# Patient Record
Sex: Female | Born: 1950
Health system: Southern US, Community
[De-identification: ages and names within clinical notes are randomized; demographics above are authoritative.]

## PROBLEM LIST (undated history)

## (undated) DIAGNOSIS — F32A Depression, unspecified: Secondary | ICD-10-CM

## (undated) DIAGNOSIS — I10 Essential (primary) hypertension: Secondary | ICD-10-CM

## (undated) DIAGNOSIS — F329 Major depressive disorder, single episode, unspecified: Secondary | ICD-10-CM

## (undated) DIAGNOSIS — K5792 Diverticulitis of intestine, part unspecified, without perforation or abscess without bleeding: Secondary | ICD-10-CM

## (undated) DIAGNOSIS — E785 Hyperlipidemia, unspecified: Secondary | ICD-10-CM

## (undated) DIAGNOSIS — F419 Anxiety disorder, unspecified: Secondary | ICD-10-CM

## (undated) DIAGNOSIS — K589 Irritable bowel syndrome without diarrhea: Secondary | ICD-10-CM

## (undated) DIAGNOSIS — K5732 Diverticulitis of large intestine without perforation or abscess without bleeding: Secondary | ICD-10-CM

## (undated) HISTORY — DX: Anxiety disorder, unspecified: F41.9

## (undated) HISTORY — DX: Depression, unspecified: F32.A

## (undated) HISTORY — PX: TONSILLECTOMY: SHX5217

## (undated) HISTORY — DX: Diverticulitis of large intestine without perforation or abscess without bleeding: K57.32

## (undated) HISTORY — DX: Essential (primary) hypertension: I10

## (undated) HISTORY — PX: ADENOIDECTOMY: SUR15

## (undated) HISTORY — DX: Hyperlipidemia, unspecified: E78.5

## (undated) HISTORY — PX: WRIST SURGERY: SHX841

## (undated) HISTORY — DX: Major depressive disorder, single episode, unspecified: F32.9

## (undated) HISTORY — PX: VAGINAL HYSTERECTOMY: SUR661

## (undated) HISTORY — PX: TONSILLECTOMY: SUR1361

## (undated) HISTORY — DX: Diverticulitis of intestine, part unspecified, without perforation or abscess without bleeding: K57.92

## (undated) HISTORY — DX: Irritable bowel syndrome, unspecified: K58.9

---

## 2009-04-11 ENCOUNTER — Ambulatory Visit (HOSPITAL_COMMUNITY): Admission: RE | Admit: 2009-04-11 | Discharge: 2009-04-11 | Payer: Self-pay | Admitting: Internal Medicine

## 2009-04-11 ENCOUNTER — Encounter (INDEPENDENT_AMBULATORY_CARE_PROVIDER_SITE_OTHER): Payer: Self-pay | Admitting: *Deleted

## 2009-04-12 ENCOUNTER — Encounter (INDEPENDENT_AMBULATORY_CARE_PROVIDER_SITE_OTHER): Payer: Self-pay | Admitting: *Deleted

## 2009-04-12 ENCOUNTER — Telehealth (INDEPENDENT_AMBULATORY_CARE_PROVIDER_SITE_OTHER): Payer: Self-pay | Admitting: *Deleted

## 2009-04-13 ENCOUNTER — Ambulatory Visit: Payer: Self-pay | Admitting: Cardiology

## 2009-04-13 ENCOUNTER — Ambulatory Visit: Payer: Self-pay | Admitting: Gastroenterology

## 2009-04-13 ENCOUNTER — Telehealth: Payer: Self-pay | Admitting: Physician Assistant

## 2009-04-13 DIAGNOSIS — F411 Generalized anxiety disorder: Secondary | ICD-10-CM

## 2009-04-13 DIAGNOSIS — F324 Major depressive disorder, single episode, in partial remission: Secondary | ICD-10-CM | POA: Insufficient documentation

## 2009-04-13 DIAGNOSIS — F329 Major depressive disorder, single episode, unspecified: Secondary | ICD-10-CM | POA: Insufficient documentation

## 2009-04-13 DIAGNOSIS — K589 Irritable bowel syndrome without diarrhea: Secondary | ICD-10-CM | POA: Insufficient documentation

## 2009-04-13 DIAGNOSIS — F419 Anxiety disorder, unspecified: Secondary | ICD-10-CM | POA: Insufficient documentation

## 2009-04-17 ENCOUNTER — Telehealth: Payer: Self-pay | Admitting: Physician Assistant

## 2009-04-19 ENCOUNTER — Ambulatory Visit: Payer: Self-pay | Admitting: Gastroenterology

## 2009-04-19 DIAGNOSIS — K5732 Diverticulitis of large intestine without perforation or abscess without bleeding: Secondary | ICD-10-CM

## 2009-04-19 HISTORY — DX: Diverticulitis of large intestine without perforation or abscess without bleeding: K57.32

## 2009-04-20 ENCOUNTER — Telehealth: Payer: Self-pay | Admitting: Gastroenterology

## 2009-04-26 ENCOUNTER — Telehealth: Payer: Self-pay | Admitting: Gastroenterology

## 2009-04-26 ENCOUNTER — Ambulatory Visit: Payer: Self-pay | Admitting: Internal Medicine

## 2009-04-27 ENCOUNTER — Telehealth: Payer: Self-pay | Admitting: Gastroenterology

## 2009-05-03 ENCOUNTER — Encounter (INDEPENDENT_AMBULATORY_CARE_PROVIDER_SITE_OTHER): Payer: Self-pay | Admitting: *Deleted

## 2009-05-03 ENCOUNTER — Ambulatory Visit: Payer: Self-pay | Admitting: Gastroenterology

## 2009-05-04 ENCOUNTER — Telehealth: Payer: Self-pay | Admitting: Gastroenterology

## 2009-05-05 ENCOUNTER — Ambulatory Visit: Payer: Self-pay | Admitting: Gastroenterology

## 2009-05-09 ENCOUNTER — Encounter: Payer: Self-pay | Admitting: Gastroenterology

## 2009-06-01 ENCOUNTER — Telehealth: Payer: Self-pay | Admitting: Gastroenterology

## 2010-02-05 ENCOUNTER — Emergency Department (HOSPITAL_COMMUNITY)
Admission: EM | Admit: 2010-02-05 | Discharge: 2010-02-05 | Payer: Self-pay | Source: Home / Self Care | Admitting: Emergency Medicine

## 2010-03-04 HISTORY — PX: COLON SURGERY: SHX602

## 2010-03-18 ENCOUNTER — Emergency Department (HOSPITAL_COMMUNITY)
Admission: EM | Admit: 2010-03-18 | Discharge: 2010-03-18 | Payer: Self-pay | Source: Home / Self Care | Admitting: Family Medicine

## 2010-04-03 NOTE — Letter (Signed)
Summary: [facility] Instructions  [facility]  [address] [place], [place] [address]   Phone: [phone]  Fax: [phone]       Teresa Vasquez    08/08/1950    MRN: [id number]        Procedure Day /Date: 05/05/2009 Friday     Arrival Time: 8:00am     Procedure Time: 9:00am     Location of Procedure:                    X   [facility] (4th Floor)   PREPARATION FOR COLONOSCOPY WITH MOVIPREP   Starting 5 days prior to your procedure Today do not eat nuts, seeds, popcorn, corn, beans, peas,  salads, or any raw vegetables.  Do not take any fiber supplements (e.g. Metamucil, Citrucel, and Benefiber).  THE DAY BEFORE YOUR PROCEDURE         DATE: 05/04/2009  DAY: Thursday  1.  Drink clear liquids the entire day-NO SOLID FOOD  2.  Do not drink anything colored red or purple.  Avoid juices with pulp.  No orange juice.  3.  Drink at least 64 oz. (8 glasses) of fluid/clear liquids during the day to prevent dehydration and help the prep work efficiently.  CLEAR LIQUIDS INCLUDE: Water Jello Ice Popsicles Tea (sugar ok, no milk/cream) Powdered fruit flavored drinks Coffee (sugar ok, no milk/cream) Gatorade Juice: apple, white grape, white cranberry  Lemonade Clear bullion, consomm, broth Carbonated beverages (any kind) Strained chicken noodle soup Hard Candy                             4.  In the morning, mix first dose of MoviPrep solution:    Empty 1 Pouch A and 1 Pouch B into the disposable container    Add lukewarm drinking water to the top line of the container. Mix to dissolve    Refrigerate (mixed solution should be used within 24 hrs)  5.  Begin drinking the prep at 5:00 p.m. The MoviPrep container is divided by 4 marks.   Every 15 minutes drink the solution down to the next mark (approximately 8 oz) until the full liter is complete.   6.  Follow completed prep with 16 oz of clear liquid of your choice (Nothing red or purple).  Continue  to drink clear liquids until bedtime.  7.  Before going to bed, mix second dose of MoviPrep solution:    Empty 1 Pouch A and 1 Pouch B into the disposable container    Add lukewarm drinking water to the top line of the container. Mix to dissolve    Refrigerate  THE DAY OF YOUR PROCEDURE      DATE: 05/05/2009 DAY: Friday  Beginning at 4:00a.m. (5 hours before procedure):         1. Every 15 minutes, drink the solution down to the next mark (approx 8 oz) until the full liter is complete.  2. Follow completed prep with 16 oz. of clear liquid of your choice.    3. You may drink clear liquids until 7:00am (2 HOURS BEFORE PROCEDURE).   MEDICATION INSTRUCTIONS  Unless otherwise instructed, you should take regular prescription medications with a small sip of water   as early as possible the morning of your procedure.         OTHER INSTRUCTIONS  You will need a responsible adult at least 58 years of age  to accompany you and drive you home.   This person must remain in the waiting room during your procedure.  Wear loose fitting clothing that is easily removed.  Leave jewelry and other valuables at home.  However, you may wish to bring a book to read or  an iPod/MP3 player to listen to music as you wait for your procedure to start.  Remove all body piercing jewelry and leave at home.  Total time from sign-in until discharge is approximately 2-3 hours.  You should go home directly after your procedure and rest.  You can resume normal activities the  day after your procedure.  The day of your procedure you should not:   Drive   Make legal decisions   Operate machinery   Drink alcohol   Return to work  You will receive specific instructions about eating, activities and medications before you leave.    The above instructions have been reviewed and explained to me by   _______________________    I fully understand and can verbalize these instructions  _____________________________ Date _________

## 2010-04-03 NOTE — Progress Notes (Signed)
Summary: speak to nurse  Phone Note Call from Patient Call back at Home Phone [phone]   Caller: Patient Call For: Teresa Vasquez Reason for Call: Talk to Nurse Summary of Call: Patient has a lot of abd pain and wants to discuss results given to her with nurse Initial call taken by: Tawni Levy,  April 17, 2009 8:30 AM  Follow-up for Phone Call        Called pt at work @ [phone] and she saw Mike Gip PA-C on 04-13-09.  Had CT, they found Diverticulitis.  Pt is still taking Antibiotiocs but not feeling much better.  I was to call in Levbid to [facility] and did not do that.  I sent that perscription today.  I urged her to take that for the cramping.  She thanked me fo making her the appt with Dr. Arlyce Vasquez on Wed 04-19-09 at 3:45 PM.  Follow-up by: Joselyn Glassman,  April 17, 2009 2:09 PM    New/Updated Medications: LEVBID 0.375 MG XR12H-TAB (HYOSCYAMINE SULFATE) Take 1 tab twice daily  for abdominal cramps and spasms Prescriptions: LEVBID 0.375 MG XR12H-TAB (HYOSCYAMINE SULFATE) Take 1 tab twice daily  for abdominal cramps and spasms  #60 x 0   Entered by:   Lowry Ram NCMA   Authorized by:   Sammuel Cooper PA-c   Signed by:   Lowry Ram NCMA on 04/17/2009   Method used:   [facility] [address] [phone][facility] (retail)       [address]       [place], [place]  [address]       Ph: [phone]       Fax: [phone]   RxID:   [id number]

## 2010-04-03 NOTE — Procedures (Signed)
Summary: Colonoscopy  Patient: Teresa Vasquez Note: All result statuses are Final unless otherwise noted.  Tests: (1) Colonoscopy (COL)   COL Colonoscopy           DONE      [facility]     [address]     [place], [place]  [address]           COLONOSCOPY PROCEDURE REPORT           PATIENT:  Teresa Vasquez, Teresa Vasquez  MR#:  [id number]     BIRTHDATE:  August 22, 1950, 58 yrs. old  GENDER:  female           ENDOSCOPIST:  Barbette Hair. Arlyce Dice, MD     Referred by:           PROCEDURE DATE:  05/05/2009     PROCEDURE:  Colon with cold biopsy polypectomy     ASA CLASS:  Class II     INDICATIONS:  abdominal pain, diverticulitis           MEDICATIONS:   Fentanyl 125 mcg IV, Versed 12 mg IV, Benadryl 25     mg IV           DESCRIPTION OF PROCEDURE:   After the risks benefits and     alternatives of the procedure were thoroughly explained, informed     consent was obtained.  Digital rectal exam was performed and     revealed no abnormalities.   The LB CF-[id number] endoscope     was introduced through the anus and advanced to the cecum, which     was identified by both the appendix and ileocecal valve, without     limitations.  The quality of the prep was adequate, using     MoviPrep.  The instrument was then slowly withdrawn as the colon     was fully examined.     <<PROCEDUREIMAGES>>           FINDINGS:  A sessile polyp was found in the sigmoid colon. It was     2 mm in size. It was found 28 cm from the point of entry.     Inflammatory appearing polyp The polyp was removed using cold     biopsy forceps (see image12).  Moderate diverticulosis was found     in the sigmoid colon (see image11 and image10).  This was     otherwise a normal examination of the colon (see image3, image4,     image6, image7, image8, image9, and image14).   Retroflexed views     in the rectum revealed no abnormalities.    The scope was then     withdrawn from the patient and the procedure completed.        COMPLICATIONS:  None           ENDOSCOPIC IMPRESSION:     1) 2 mm sessile polyp in the sigmoid colon     2) Moderate diverticulosis in the sigmoid colon     3) Otherwise normal examination     RECOMMENDATIONS:     1) continue current medications     2) If the polyp(s) removed today are proven to be adenomatous     (pre-cancerous) polyps, you will need a repeat colonoscopy in 5     years. Otherwise you should continue to follow colorectal cancer     screening guidelines for "routine risk" patients with colonoscopy     in 10 years.     3) office  visit 3 weeks           REPEAT EXAM:   You will receive a letter from Dr. Arlyce Dice in 1-2     weeks, after reviewing the final pathology, with followup     recommendations.           ______________________________     Barbette Hair Arlyce Dice, MD           CC:  Teresa Fee PA           n.     eSIGNED:   Barbette Hair. Charbel Los at 05/05/2009 09:38 AM           Page 2 of 3   Teresa Vasquez, Teresa Vasquez [place], [phone]  Note: An exclamation mark (!) indicates a result that was not dispersed into the flowsheet. Document Creation Date: 05/05/2009 9:38 AM _______________________________________________________________________  (1) Order result status: Final Collection or observation date-time: 05/05/2009 09:29 Requested date-time:  Receipt date-time:  Reported date-time:  Referring Physician:   Ordering Physician: Melvia Heaps [phone]) Specimen Source:  Source: Launa Grill Order Number: [id number] Lab site:   Appended Document: Colonoscopy 10 yr recall     Procedures Next Due Date:    Colonoscopy: 05/2019

## 2010-04-03 NOTE — Progress Notes (Signed)
Summary: TRIAGE-Condition Update  Phone Note Call from Patient Call back at [phone]   Caller: Patient Call For: Dr. Arlyce Dice Reason for Call: Talk to Nurse Summary of Call: pt had appt yesterday with Dr. Arlyce Dice and pt says she was instructed to sch a 2 week f/u. Initial call taken by: Vallarie Mare,  April 20, 2009 2:50 PM  Follow-up for Phone Call        Message left for patient to callback. Laureen Ochs LPN  April 20, 2009 3:26 PM  Was seen 04-19-09 as follow-up for diverticulitis, she continues the  Cipro/Flagyl, but states, "I just don't feel any better" Continues with intermittent LLQ pain, pain has gotten "Some better" Denies blood, black stools, fever.   1) Continue Antibiotics and Levsin 2) tylenol/Ibuprofen as needed 3)Soft,bland diet x2-4 days. Advanced as tolerated. 4) Heating pad to abdomen as needed. 5) Get plenty of rest this weekend 6) Pt. to keep scheduled office visit 05-03-09 at 11:30am 7) If symptoms become worse call back immediately or go to ER.    Follow-up by: Laureen Ochs LPN,  April 21, 2009 9:31 AM  Additional Follow-up for Phone Call Additional follow up Details #1::        c/b Mon if no better;  will repeat CT scan if pain persists Additional Follow-up by: Louis Meckel MD,  April 21, 2009 10:44 AM    Additional Follow-up for Phone Call Additional follow up Details #2::    I will call pt. 04-24-09 to get a condition update. Laureen Ochs LPN  April 21, 2009 10:46 AM   Message left for patient to callback. Laureen Ochs LPN  April 24, 2009 10:46 AM   Pt. states she feels a little better, improving slowly. She declines the CT at this time. If symptoms become worse she will call back right away.  Follow-up by: Laureen Ochs LPN,  April 24, 2009 11:10 AM

## 2010-04-03 NOTE — Assessment & Plan Note (Signed)
Summary: F/U DIVERTICULITIS               Teresa Vasquez   History of Present Illness Visit Type: Follow-up Visit Primary GI MD: Melvia Heaps MD [facility] Primary Provider: Loree Fee, PA Chief Complaint: diverticular disease, patient has had no change History of Present Illness:   Teresa Vasquez has returned for ongoing evaluation of lower abdominal  pain.  She has persistent bloating and postprandial discomfort in the lower abdomen.  A repeat CT scan showed nearly complete resolution of her proximal sigmoid diverticulitis.  I reviewed the exam and concur with the interpretation.  She is moving her bowels regularly.  She received an extra week of antibiotics because of persistent discomfort.  She complains of mild nausea.  She is without fever or chills.   GI Review of Systems    Reports abdominal pain, bloating, and  nausea.     Location of  Abdominal pain: lower abdomen.    Denies acid reflux, belching, chest pain, dysphagia with liquids, dysphagia with solids, heartburn, loss of appetite, vomiting, vomiting blood, weight loss, and  weight gain.      Reports black tarry stools, diarrhea, and  diverticulosis.     Denies anal fissure, change in bowel habit, constipation, fecal incontinence, heme positive stool, hemorrhoids, irritable bowel syndrome, jaundice, light color stool, liver problems, rectal bleeding, and  rectal pain.    Current Medications (verified): 1)  Vitamin D (Ergocalciferol) 50000 Unit Caps (Ergocalciferol) .... One Tablet By Mouth Once Daily 2)  Zoloft 50 Mg Tabs (Sertraline Hcl) .... One Tablet By Mouth Once Daily 3)  Alprazolam 1 Mg Tabs (Alprazolam) .... 1/2- 1 Tablets By Mouth As Needed 4)  Cipro 500 Mg Tabs (Ciprofloxacin Hcl) .... Take 1 Tab Twice Daily X 7 Days 5)  Flagyl 500 Mg Tabs (Metronidazole) .... Take 1 Tab Twice  Daily X 7 Days 6)  Levbid 0.375 Mg Xr12h-Tab (Hyoscyamine Sulfate) .... Take 1 Tab Twice Daily  For Abdominal Cramps and Spasms  Allergies  (verified): No Known Drug Allergies  Past History:  Past Medical History: Anxiety Disorder Depression Irritable Bowel Syndrome Diverticulitis  Past Surgical History: Reviewed history from 04/13/2009 and no changes required. Hysterectomy Tonsillectomy  Family History: Reviewed history from 04/13/2009 and no changes required. Family History of Heart Disease: Father No FH of Colon Cancer:  Social History: Reviewed history from 04/19/2009 and no changes required. Married Patient has never smoked.  Alcohol Use - no Daily Caffeine Use Illicit Drug Use - no Occupation: [employer]  Review of Systems       The patient complains of allergy/sinus, anxiety-new, cough, fatigue, itching, muscle pains/cramps, sleeping problems, and thirst - excessive.  The patient denies anemia, arthritis/joint pain, back pain, blood in urine, breast changes/lumps, change in vision, confusion, coughing up blood, depression-new, fainting, fever, headaches-new, hearing problems, heart murmur, heart rhythm changes, menstrual pain, night sweats, nosebleeds, pregnancy symptoms, shortness of breath, skin rash, sore throat, swelling of feet/legs, swollen lymph glands, thirst - excessive , urination - excessive , urination changes/pain, urine leakage, vision changes, and voice change.    Vital Signs:  Patient profile:   60 year old female Height:      67 inches Weight:      190.38 pounds BMI:     29.93 Pulse rate:   60 / minute Pulse rhythm:   regular BP sitting:   122 / 80  (left arm) Cuff size:   regular  Vitals Entered By: June McMurray CMA (AAMA) (  May 03, 2009 11:48 AM)  Physical Exam  Additional Exam:  On abdominal exam there are no abdominal masses, tenderness organomegaly   Impression & Recommendations:  Problem # 1:  ABDOMINAL PAIN-LLQ (ICD-789.04) Persistent pain could be due to luminal narrowing as a result of acute inflammatory disease.  This is most likely resolving acute  diverticulitis.  A structural abnormalityt of the colon including neoplasm is a less likely consideration.  Recommendations #1 DC Levbid (no improvement) #2 colonoscopy  Risks, alternatives, and complications of the procedure, including bleeding, perforation, and possible need for surgery, were explained to the patient.  Patient's questions were answered.  Other Orders: Colonoscopy (Colon)  Patient Instructions: 1)  Come for you Colonoscopy on 05/05/2009 on the 4th floor arrive at 8am. 2)  Your Moviprep has been sent to your pharm. 3)  Colonoscopy and Flexible Sigmoidoscopy brochure given.  4)  Conscious Sedation brochure given.  5)  CC Melissa Smith 6)  The medication list was reviewed and reconciled.  All changed / newly prescribed medications were explained.  A complete medication list was provided to the patient / caregiver. Prescriptions: MOVIPREP 100 GM  SOLR (PEG-KCL-NACL-NASULF-NA ASC-C) As per prep instructions.  #1 x 0   Entered by:   Harlow Mares CMA (AAMA)   Authorized by:   Louis Meckel MD   Signed by:   Harlow Mares CMA (AAMA) on 05/03/2009   Method used:   Faxed to ...       [facility] (retail)       [address]       [place], [place]  [address]       Ph: [phone]       Fax: [phone]   RxID:   [id number]

## 2010-04-03 NOTE — Progress Notes (Signed)
Summary: Triage  Phone Note Call from Patient Call back at [phone]   Caller: Patient Call For: Dr. Arlyce Dice Reason for Call: Talk to Nurse Summary of Call: Pt would like to know if we have a rebate coupon for the Moviprep? Initial call taken by: Karna Christmas,  May 04, 2009 9:12 AM  Follow-up for Phone Call         Coupon is available is having colon in am so coupon taken up to [facility] for her . Follow-up by: Teryl Lucy RN,  May 04, 2009 10:43 AM

## 2010-04-03 NOTE — Progress Notes (Signed)
Summary: TRIAGE-Repeat CT   Phone Note Call from Patient Call back at [phone]   Caller: Patient Call For: Arlyce Dice Reason for Call: Talk to Nurse Summary of Call: Patient a states that she does not feel better, still has pain, bloating wants  to speak to [facility] Initial call taken by: Tawni Levy,  April 26, 2009 9:06 AM  Follow-up for Phone Call        Pt. is being treated for diverticulitis since 04-13-09. Completes antibiotics today. Since Monday she c/o worsening nausea, bloating and abd.pain. "I just feel so tired and out of sorts, this is really getting me down" Per Dr.Murlene Revell, in the triage dated 04-24-09, if pain persists then repeat the CT scan.  Pt. declines a CT scan, states she cannot afford it. Wants to know what else Dr.Kinzley Savell recommends.  Follow-up by: Laureen Ochs LPN,  April 26, 2009 9:21 AM  Additional Follow-up for Phone Call Additional follow up Details #1::        flagyl may be causing nausea.  c/b in 2 days for update Additional Follow-up by: Louis Meckel MD,  April 26, 2009 9:33 AM    Additional Follow-up for Phone Call Additional follow up Details #2::    Above MD orders reviewed with patient. Now pt. states if she can get a CT done today she will consider it. I have contacted [facility] and they can do it at 3pm today. Pt. agrees to have CT done, all instructions reviewed with pt. by phone and she will get contrast from [place] CT today. Pt. is aware we will call in the morning with CT report and further MD orders. Follow-up by: Laureen Ochs LPN,  April 26, 2009 9:45 AM

## 2010-04-03 NOTE — Assessment & Plan Note (Signed)
Summary: abd pain and diarrhea/pl   History of Present Illness Visit Type: Initial Consult Primary GI MD: Melvia Heaps MD [facility] Primary Provider: Loree Fee, PA Chief Complaint: Intermittant Lower sharp abd pains x 2 months that is getiing more frequent and worse over last couple of weeks. Pt has been Dx with IBS but she states this is different than that pain. Pt states she does have some nausea with the pain. Pt does have diarrhea after she experiences the episodes of abd pain.  Pt does see some BRB from frequency of having a BM and a hemorrhiod.  History of Present Illness:   60 YO FEMALE REFERRED BY PRIMARY CARE FOR EVALUATION OF ABDOMINAL PAIN. PT HAS HX OF IBS -HAD BEEN SEEN BY DR Luther Parody 10 YEARS AGO,NO COLONOSCOPY. SHE SAY TYPICALLY SHE WILL HAVE SHORT BOUTS OF ABDOMINAL DISCOMFORT,AND DIARRHEA,SXSX MAY LAST A DAY OR TWO THEN RESOLVE. SHE COMES IN NOW WITH 2 MONTH HX OF LOWER ABDOMINAL DISCOMFORT THAT IS DIFFERENT, SHE DESRIBES IT AS IBS ON STEROIDS. SHE HAS BEEN HAVING CONSTANT DISCOMFORT,BLOATING AND LOOSE STOOLS . SHE HAS BEEN HAVING PAIN WAKE HER AT NIGHT,THEN URGENCY FOR BM.APPETITE FAIR, SOME NAUSEA,NO FEVER, CHILLS ETC.WEIGHT IS STABLE. SHE TOOK A COURSE OF ZPAK IN DEC,THEN AUGMENTIN IN JANUARY,BUT HER ABDOMINAL PAIN HAD STARTED BEFORE THE AUGMENTIN.   GI Review of Systems    Reports abdominal pain, bloating, and  nausea.     Location of  Abdominal pain: lower abdomen.    Denies acid reflux, belching, chest pain, dysphagia with liquids, dysphagia with solids, heartburn, loss of appetite, vomiting, vomiting blood, weight loss, and  weight gain.      Reports hemorrhoids, irritable bowel syndrome, and  rectal bleeding.     Denies anal fissure, black tarry stools, change in bowel habit, constipation, diverticulosis, fecal incontinence, heme positive stool, jaundice, light color stool, liver problems, and  rectal pain. Preventive Screening-Counseling &  Management  Alcohol-Tobacco     Smoking Status: never      Drug Use:  no.      Current Medications (verified): 1)  Vitamin D (Ergocalciferol) 50000 Unit Caps (Ergocalciferol) .... One Tablet By Mouth Once Daily 2)  Zoloft 50 Mg Tabs (Sertraline Hcl) .... One Tablet By Mouth Once Daily 3)  Alprazolam 1 Mg Tabs (Alprazolam) .... 1/2- 1 Tablets By Mouth As Needed  Allergies (verified): No Known Drug Allergies  Past History:  Past Medical History: Anxiety Disorder Depression Irritable Bowel Syndrome  Past Surgical History: Hysterectomy Tonsillectomy  Family History: Family History of Heart Disease: Father No FH of Colon Cancer:  Social History: Married Patient has never smoked.  Alcohol Use - no Daily Caffeine Use Illicit Drug Use - no Smoking Status:  never Drug Use:  no  Review of Systems  The patient denies allergy/sinus, anemia, anxiety-new, arthritis/joint pain, back pain, blood in urine, breast changes/lumps, change in vision, confusion, cough, coughing up blood, depression-new, fainting, fatigue, fever, headaches-new, hearing problems, heart murmur, heart rhythm changes, itching, menstrual pain, muscle pains/cramps, night sweats, nosebleeds, pregnancy symptoms, shortness of breath, skin rash, sleeping problems, sore throat, swelling of feet/legs, swollen lymph glands, thirst - excessive , urination - excessive , urination changes/pain, urine leakage, vision changes, and voice change.         OTHERWISE AS IN HPI  Vital Signs:  Patient profile:   60 year old female Height:      67 inches Weight:      195.50 pounds BMI:     30.73  Pulse rate:   80 / minute Pulse rhythm:   regular BP sitting:   122 / 74  (right arm) Cuff size:   regular  Vitals Entered By: Christie Nottingham CMA Duncan Dull) (April 13, 2009 9:34 AM)  Physical Exam  General:  Well developed, well nourished, no acute distress. Head:  Normocephalic and atraumatic. Eyes:  PERRLA, no icterus. Lungs:   Clear throughout to auscultation. Heart:  Regular rate and rhythm; no murmurs, rubs,  or bruits. Abdomen:  LARGE, DIFFUSELY TENDER ACROSS LOWER ABDOMEN,LEFT SIGNIFICANTLY MORE TENDER, NO PALP MASS OR HSM,BS+ Rectal:  HEME NEGATIVE Extremities:  No clubbing, cyanosis, edema or deformities noted. Neurologic:  Alert and  oriented x4;  grossly normal neurologically. Psych:  Alert and cooperative. Normal mood and affect.   Impression & Recommendations:  Problem # 1:  ABDOMINAL PAIN-LLQ (ICD-789.04) Assessment New 60 YO FEMALE WITH PREVIOUS DX OF IBS WITH NEW DIFFERENT LOWER ABDOMINAL PAIN X 2 MONTHS,LEFT GREATER THAN RIGHT. R/O DIVERTICULITIS,OCCULT LESION.  SCHEDULE FOR CT SCAN ABDOMEN /PELVIS  WITHIN NEXT 24 HOURS SHE WILL NEED COLONOSCOPY BUT TIMING WILL DEPEND ON FINDINGS ON CT ABD/PELVIS,THIS WILL BE SCHEDULED WITH DR. KAPLAN. CONTINUE LEVBID TWICE DAILY RECENT LABS REVIEWED Theda Sers  Problem # 2:  IRRITABLE BOWEL SYNDROME (ICD-564.1) Assessment: Comment Only  Orders: CT Abdomen/Pelvis with Contrast (CT Abd/Pelvis w/con)  Problem # 3:  DEPRESSION (ICD-311) Assessment: Comment Only  Patient Instructions: 1)  Scheduled CT of the Abdomen and Pelvis for 04-13-09 at 2:30 PM  . 2)  Contrast and directions provided. 3)  Use the Levbid twice daily as needed for abd cramping. 4)  Copy sent to : Loree Fee, MD 5)  The medication list was reviewed and reconciled.  All changed / newly prescribed medications were explained.  A complete medication list was provided to the patient / caregiver. Prescriptions: FLAGYL 500 MG TABS (METRONIDAZOLE) Take 1 tab twice  daily x 14 days  #28 x 0   Entered by:   Lowry Ram NCMA   Authorized by:   Sammuel Cooper PA-c   Signed by:   Lowry Ram NCMA on 04/13/2009   Method used:   Faxed to ...       [facility] (retail)       [address]       [place], [place]  [address]       Ph: [phone]       Fax:  [phone]   RxID:   [id number] CIPRO 500 MG TABS (CIPROFLOXACIN HCL) Take 1 tab twice daily x 14 days  #28 x 0   Entered by:   Lowry Ram NCMA   Authorized by:   Sammuel Cooper PA-c   Signed by:   Lowry Ram NCMA on 04/13/2009   Method used:   [facility]ron[facility]        [facility]  [address] [phone]* (retail)       [address]       [place], [place]  [address]       Ph: [phone]       Fax: [phone]   RxID:   [id number]

## 2010-04-03 NOTE — Letter (Signed)
Summary: Patient Notice-Hyperplastic Polyps  [facility]  [address] [place], [place] [address]   Phone: [phone]  Fax: [phone]        May 09, 2009 MRN: [id number]    [facility] [address] [place], [place]  [address]    Dear Teresa Vasquez,  I am pleased to inform you that the colon polyp(s) removed during your recent colonoscopy was (were) found to be hyperplastic.  These types of polyps are NOT pre-cancerous.  It is therefore my recommendation that you have a repeat colonoscopy examination in 10_ years for routine colorectal cancer screening.  Should you develop new or worsening symptoms of abdominal pain, bowel habit changes or bleeding from the rectum or bowels, please schedule an evaluation with either your primary care physician or with me.  Additional information/recommendations:  __No further action with gastroenterology is needed at this time.      Please follow-up with your primary care physician for your other      healthcare needs. __Please call [phone] to schedule a return visit to review      your situation.  __Please keep your follow-up visit as already scheduled.  _x_Continue treatment plan as outlined the day of your exam.  Please call us if you are having persistent problems or have questions about your condition that have not been fully answered at this time.  Sincerely,  Louis Meckel MD This letter has been electronically signed by your physician.  Appended Document: Patient Notice-Hyperplastic Polyps Letter mailed 3.10.11

## 2010-04-03 NOTE — Letter (Signed)
Summary: New Patient letter  [facility]  [address] [place], [place] [address]   Phone: [phone]  Fax: [phone]       04/12/2009 MRN: [id number]  [facility] [address] [place], [place]  [address]  Dear Ms. Levins,  Welcome to the Gastroenterology Division at [facility].    You are scheduled to see Dr.  Rob Bunting on April 21, 2009 at 3:30pm on the 3rd floor at [facility], [address]  We ask that you try to arrive at our office 15 minutes prior to your appointment time to allow for check-in.  We would like you to complete the enclosed self-administered evaluation form prior to your visit and bring it with you on the day of your appointment.  We will review it with you.  Also, please bring a complete list of all your medications or, if you prefer, bring the medication bottles and we will list them.  Please bring your insurance card so that we may make a copy of it.  If your insurance requires a referral to see a specialist, please bring your referral form from your primary care physician.  Co-payments are due at the time of your visit and may be paid by cash, check or credit card.     Your office visit will consist of a consult with your physician (includes a physical exam), any laboratory testing he/she may order, scheduling of any necessary diagnostic testing (e.g. x-ray, ultrasound, CT-scan), and scheduling of a procedure (e.g. Endoscopy, Colonoscopy) if required.  Please allow enough time on your schedule to allow for any/all of these possibilities.    If you cannot keep your appointment, please call [phone] to cancel or reschedule prior to your appointment date.  This allows [place] the opportunity to schedule an appointment for another patient in need of care.  If you do not cancel or reschedule by 5 p.m. the business day prior to your appointment date, you will be charged a $50.00 late cancellation/no-show fee.    Thank you  for choosing [facility] for your medical needs.  We appreciate the opportunity to care for you.  Please visit [place] at our website  to learn more about our practice.                     Sincerely,                                                             The Gastroenterology Division

## 2010-04-03 NOTE — Progress Notes (Signed)
Summary: Sooner Appt.  Phone Note Call from Patient Call back at [phone]   Caller: Patient Call For: Dr.Jacobs Reason for Call: Talk to Nurse Summary of Call: Pt has an appt. on 04-21-09 w/Dr. Christella Hartigan does not feel like she can wait that long. She is having abdominal pain and diarrhea. Pt. said she had a gallbladder ultrasound that was neg. yesterday. She also has IBS. Initial call taken by: Karna Christmas,  April 12, 2009 2:01 PM  Follow-up for Phone Call        pt has been scheduled with Amy for 04/13/09. Follow-up by: Chales Abrahams CMA Duncan Dull),  April 12, 2009 2:27 PM

## 2010-04-03 NOTE — Progress Notes (Signed)
Summary: Perscriptions to [facility]  Phone Note Outgoing Call   Call placed by: Joselyn Glassman,  April 13, 2009 5:14 PM Call placed to: Patient Summary of Call: Informed pt I had to send her perscriptions for Flagyl and Cipro to [facility]  I gave her the phone number and address.  She thanked [place] for finding the Flagyl for her.  I explained the shortage all over town with the pharmacies. I called the pharmacy and they did  receive the fax's for the perscriptions. Initial call taken by: Joselyn Glassman,  April 13, 2009 5:17 PM

## 2010-04-03 NOTE — Progress Notes (Signed)
Summary: Refill request  ---- Converted from flag ---- ---- 06/01/2009 9:08 AM, Louis Meckel MD wrote: If she's having pain she need to be reassessed; she can see  a PA  ---- 05/31/2009 4:13 PM, Merri Ray CMA (AAMA) wrote: Dr Arlyce Dice, I have recieved a refill request for Flagyl for this pt. Can I refill?? ------------------------------  Sent denial to pharmacyCalled pt to inform if still in pain she needs to contact office

## 2010-04-03 NOTE — Progress Notes (Signed)
Summary: TRIAGE  Phone Note Call from Patient Call back at Home Phone [phone]   Call For: Dr Arlyce Dice Summary of Call: Wonders if she needs another course of antibiotics? Initial call taken by: Leanor Kail [facility],  April 27, 2009 2:18 PM  Follow-up for Phone Call        DR.KAPLAN PLEASE REVIEW CT FROM 04-26-09 AND ADVISE  Follow-up by: Laureen Ochs LPN,  April 27, 2009 2:21 PM  Additional Follow-up for Phone Call Additional follow up Details #1::        1 more week of cipro/flagyl Additional Follow-up by: Louis Meckel MD,  April 27, 2009 2:35 PM    Additional Follow-up for Phone Call Additional follow up Details #2::    Above MD orders reviewed with patient. She will callback in 1 week with an update, sooner as needed. Follow-up by: Laureen Ochs LPN,  April 27, 2009 3:03 PM  New/Updated Medications: CIPRO 500 MG TABS (CIPROFLOXACIN HCL) Take 1 tab twice daily x 7 days FLAGYL 500 MG TABS (METRONIDAZOLE) Take 1 tab twice  daily x 7 days Prescriptions: FLAGYL 500 MG TABS (METRONIDAZOLE) Take 1 tab twice  daily x 7 days  #14 x 0   Entered by:   Laureen Ochs LPN   Authorized by:   Louis Meckel MD   Signed by:   Laureen Ochs LPN on 16/60/6301   Method used:   [facility]  [address] [phone][facility] (retail)       [address]       [place], [place]  [address]       Ph: [phone]       Fax: [phone]   RxID:   [id number] CIPRO 500 MG TABS (CIPROFLOXACIN HCL) Take 1 tab twice daily x 7 days  #14 x 0   Entered by:   Laureen Ochs LPN   Authorized by:   Louis Meckel MD   Signed by:   Laureen Ochs LPN on 94/85/4627   Method used:   [facility]  [address] [phone]* (retail)       [address]       [place], [place]  [address]       Ph: [phone]       Fax: [phone]   RxID:   [id number]   Appended Document: TRIAGE Resent in Flagyl 500 to [facility] for pt. L/M for pt to pick up later this afternoon

## 2010-04-03 NOTE — Assessment & Plan Note (Signed)
Summary: F/U Diverticulitis, review CT, Saw Amy Esterwood PA   History of Present Illness Visit Type: Follow-up Visit Primary GI MD: Melvia Heaps MD [facility] Primary Provider: Loree Fee, PA Chief Complaint: follow-up diverticulitis/CT Scan/not feeling any better History of Present Illness:   Ms. Theard has returned for followup of her abdominal pain.  CT Scan, which I reviewed, demonstrates acute inflammatory changes and a thickened colonic wall near the sigmoid colon consistent with acute diverticulitis.  She has been taking Cipro and Flagyl.  She reports persistent pressure--like pain left lower quadrant.  She is without fever or chills.  Pain is not severe but it remains.   GI Review of Systems    Reports abdominal pain, bloating, and  nausea.     Location of  Abdominal pain: generalized.    Denies acid reflux, belching, chest pain, dysphagia with liquids, dysphagia with solids, heartburn, loss of appetite, vomiting, vomiting blood, weight loss, and  weight gain.        Denies anal fissure, black tarry stools, change in bowel habit, constipation, diarrhea, diverticulosis, fecal incontinence, heme positive stool, hemorrhoids, irritable bowel syndrome, jaundice, light color stool, liver problems, rectal bleeding, and  rectal pain.    Current Medications (verified): 1)  Vitamin D (Ergocalciferol) 50000 Unit Caps (Ergocalciferol) .... One Tablet By Mouth Once Daily 2)  Zoloft 50 Mg Tabs (Sertraline Hcl) .... One Tablet By Mouth Once Daily 3)  Alprazolam 1 Mg Tabs (Alprazolam) .... 1/2- 1 Tablets By Mouth As Needed 4)  Cipro 500 Mg Tabs (Ciprofloxacin Hcl) .... Take 1 Tab Twice Daily X 14 Days 5)  Flagyl 500 Mg Tabs (Metronidazole) .... Take 1 Tab Twice  Daily X 14 Days 6)  Levbid 0.375 Mg Xr12h-Tab (Hyoscyamine Sulfate) .... Take 1 Tab Twice Daily  For Abdominal Cramps and Spasms  Allergies (verified): No Known Drug Allergies  Family History: Reviewed history from 04/13/2009 and no  changes required. Family History of Heart Disease: Father No FH of Colon Cancer:  Social History: Reviewed history from 04/13/2009 and no changes required. Married Patient has never smoked.  Alcohol Use - no Daily Caffeine Use Illicit Drug Use - no Occupation: [employer]  Review of Systems       The patient complains of cough, fatigue, fever, and muscle pains/cramps.  The patient denies allergy/sinus, anemia, anxiety-new, arthritis/joint pain, back pain, blood in urine, breast changes/lumps, change in vision, confusion, coughing up blood, depression-new, fainting, headaches-new, hearing problems, heart murmur, heart rhythm changes, itching, menstrual pain, night sweats, nosebleeds, pregnancy symptoms, shortness of breath, skin rash, sleeping problems, sore throat, swelling of feet/legs, swollen lymph glands, thirst - excessive , urination - excessive , urination changes/pain, urine leakage, vision changes, and voice change.    Vital Signs:  Patient profile:   60 year old female Height:      67 inches Weight:      195 pounds BMI:     30.65 Pulse rate:   68 / minute Pulse rhythm:   regular BP sitting:   118 / 68  (left arm)  Vitals Entered By: Milford Cage NCMA (April 19, 2009 3:49 PM)  Physical Exam  Additional Exam:  She is a well-developed well-nourished female  On abdominal exam there is fullness in the left lower quadrant without guarding or rebound there are no frank masses organomegaly   Impression & Recommendations:  Problem # 1:  DIVERTICULITIS OF COLON (ICD-562.11) On antibiotics she has had just slight improvement.  Pain and pressure may be  due to luminal narrowing with spasm.  Recommendations #1 continue antibiotics #2 continue hyoscyamine as needed  Patient Instructions: 1)  CC Loree Fee, PA

## 2010-05-10 ENCOUNTER — Telehealth: Payer: Self-pay | Admitting: Gastroenterology

## 2010-05-11 ENCOUNTER — Encounter: Payer: Self-pay | Admitting: Nurse Practitioner

## 2010-05-11 ENCOUNTER — Ambulatory Visit (INDEPENDENT_AMBULATORY_CARE_PROVIDER_SITE_OTHER): Payer: BC Managed Care – PPO | Admitting: Nurse Practitioner

## 2010-05-11 DIAGNOSIS — K5732 Diverticulitis of large intestine without perforation or abscess without bleeding: Secondary | ICD-10-CM

## 2010-05-15 NOTE — Progress Notes (Signed)
Summary: Triage:  Abdominal Pain  Phone Note Call from Patient Call back at [phone]   Caller: Patient Call For: Dr Arlyce Dice Reason for Call: Talk to Nurse Summary of Call: Patient wants to be seen asap for diverticulities flare up. Initial call taken by: Tawni Levy,  May 10, 2010 10:07 AM  Follow-up for Phone Call        Patient states she is having abdominal pain on her left side below her belly button. States the pain is just like it was last year when she had diverticulitis. Appt made for patient to see Willette Cluster, RNP 05/11/10@8 :30am. Patient aware of appointment date and time. Follow-up by: Selinda Michaels RN,  May 10, 2010 11:05 AM

## 2010-05-21 ENCOUNTER — Telehealth: Payer: Self-pay | Admitting: Nurse Practitioner

## 2010-05-22 ENCOUNTER — Telehealth: Payer: Self-pay | Admitting: *Deleted

## 2010-05-22 NOTE — Telephone Encounter (Signed)
Spoke with patient. Per Willette Cluster, NP  If patient has tried Hyoscyamine repeat the CT of abdomen and pelvis. Patient has tried Hyoscyamine. Scheduled patient with [facility]) on 05/24/10 at 2:30 PM. Patient will go to their facility to pick up contrast on Wednesday because it is closer to her work.

## 2010-05-23 ENCOUNTER — Ambulatory Visit (INDEPENDENT_AMBULATORY_CARE_PROVIDER_SITE_OTHER)
Admission: RE | Admit: 2010-05-23 | Discharge: 2010-05-23 | Disposition: A | Discharge status: Home / Self Care | Payer: BC Managed Care – PPO | Source: Ambulatory Visit | Attending: Gastroenterology | Admitting: Gastroenterology

## 2010-05-23 ENCOUNTER — Telehealth: Payer: Self-pay | Admitting: Gastroenterology

## 2010-05-23 DIAGNOSIS — R109 Unspecified abdominal pain: Secondary | ICD-10-CM

## 2010-05-23 MED ORDER — IOHEXOL 300 MG/ML  SOLN
100.0000 mL | Freq: Once | INTRAMUSCULAR | Status: AC | PRN
  Administered 2010-05-23: 100 mL via INTRAVENOUS

## 2010-05-23 NOTE — Telephone Encounter (Signed)
Spoke with pt and let her know that the first look at the CT scan did not show anything that really stood out as abnormal. Pt aware but she still wants to know why she is having pain. Informed pt I would send Willette Cluster, NP a note regarding her concern.

## 2010-05-24 ENCOUNTER — Telehealth: Payer: Self-pay | Admitting: Gastroenterology

## 2010-05-24 NOTE — Telephone Encounter (Signed)
Catheter no changes consistent with acute diverticulitis. I do recommend that she take hyomax as needed for abdominal discomfort.   She can complete her course of antibiotics. She should begin a probiotic as well and I like to see her back in the office in about 4 weeks

## 2010-05-24 NOTE — Telephone Encounter (Signed)
Spoke with pt last night and discussed her CT results per [place]. Pt is upset because she still does not feel good. She states she is still tired, still bloated, and still has pressure below her belly button. Pt states she does feel better since taking the antibiotic. Pt wants to know is she just supposed to "suck it up and go on." I asked if she was taking the Hyoscyamine and she states that was for her IBS, not this problems. Let pt know I would send Dr. Arlyce Dice a note and see what his recommendations are. Dr. Arlyce Dice please advise.

## 2010-05-24 NOTE — Telephone Encounter (Signed)
Rene Kocher, please let patient know that her CTscan was normal. It isn't clear why she is having the pain. Please reconfirm that she isn't having any urinary or GYN symptoms. Please have her take the bowel antispasmotic 2-3 times days and make her a follow up, preferably with primary GI doc. Thanks

## 2010-05-28 ENCOUNTER — Telehealth: Payer: Self-pay | Admitting: Gastroenterology

## 2010-05-28 NOTE — Telephone Encounter (Signed)
Patient does not want to continue with this practice.

## 2010-05-28 NOTE — Telephone Encounter (Signed)
Patient unhappy with how she has been "treated." Pt states she did not get a call from anyone regarding her CT scan. Reminded pt I had spoken with her the day the scan was done and that Lucrezia Europe NP has looked it over quickly and she saw nothing that stood out. Pt states she feels like she has just had the ball dropped to many times with her care. Pt does not want to schedule any further appts. Pt states she will ask for a referral to another GI from her medical doctor.

## 2010-05-28 NOTE — Telephone Encounter (Signed)
Tell her that I reviewed your note and that I would be happy to see her sometime this week with any cancellation. Since she continues not to feel well.

## 2010-05-28 NOTE — Telephone Encounter (Signed)
Pt aware of Dr. Marzetta Board recommendation. Pt not happy with how her case has been handled. Pt states it took a long time for her get a call back from the office. Pt unhappy because she feels like she was not helped like she thought she should have been. Pt states her calls tended to get lost with the new computer system. Dr Arlyce Dice pt does not want to schedule f/u appt at all. Pt states she is going to get her medical provider to refer her to a different GI Doc. FYI Dr. Arlyce Dice.

## 2010-05-31 NOTE — Assessment & Plan Note (Signed)
Summary: Abdominal pain   History of Present Illness Visit Type: Follow-up Visit Primary GI MD: Melvia Heaps MD [facility] Primary Provider: Loree Fee, PA Chief Complaint: Patient states that this is the start of a diverticular flare x 2 weeks, with worsening symptoms History of Present Illness:   Patient is a 60 year old female known to Dr. Arlyce Dice for a history of diverticulitis. She is here for evaluation of lower abdominal pain. She gives a hisory of IBS with alternating constipation / diarrhea and cramps but this is not that type pain. Patient  takes a daily stool softener. Having diffuse lower abdominal pain / bloating over the last several days. . Pain worse a few minutes after she eats. Pain reminiscent of diverticulitis. She is under a lot of stress lately.    GI Review of Systems    Reports abdominal pain.     Location of  Abdominal pain: lower abdomen.    Denies acid reflux, belching, bloating, chest pain, dysphagia with liquids, dysphagia with solids, heartburn, loss of appetite, nausea, vomiting, vomiting blood, weight loss, and  weight gain.      Reports diarrhea and  diverticulosis.     Denies anal fissure, black tarry stools, change in bowel habit, constipation, fecal incontinence, heme positive stool, hemorrhoids, irritable bowel syndrome, jaundice, light color stool, liver problems, rectal bleeding, and  rectal pain.    Current Medications (verified): 1)  Vitamin D (Ergocalciferol) 50000 Unit Caps (Ergocalciferol) .... One Tablet By Mouth Once Daily 2)  Alprazolam 1 Mg Tabs (Alprazolam) .... 1/2- 1 Tablets By Mouth As Needed 3)  Hyoscyamine Sulfate Cr 0.375 Mg Xr12h-Tab (Hyoscyamine Sulfate) .Marland Kitchen.. 1 By Mouth Two Times A Day As Needed For Abdominal Cramping and Spasms 4)  Aspirin 81 Mg Tbec (Aspirin) .... Once Daily 5)  Vitamin B-12 100 Mcg Tabs (Cyanocobalamin) .... Once Daily  Allergies (verified): No Known Drug Allergies  Past History:  Past Medical History: Reviewed  history from 05/03/2009 and no changes required. Anxiety Disorder Depression Irritable Bowel Syndrome Diverticulitis  Past Surgical History: Reviewed history from 04/13/2009 and no changes required. Hysterectomy Tonsillectomy  Family History: Reviewed history from 04/13/2009 and no changes required. Family History of Heart Disease: Father No FH of Colon Cancer:  Social History: Reviewed history from 04/19/2009 and no changes required. Married Patient has never smoked.  Alcohol Use - no Daily Caffeine Use Illicit Drug Use - no Occupation: [employer]  Review of Systems       The patient complains of anxiety-new, back pain, fatigue, and sleeping problems.  The patient denies allergy/sinus, anemia, arthritis/joint pain, blood in urine, breast changes/lumps, change in vision, confusion, cough, coughing up blood, depression-new, fainting, fever, headaches-new, hearing problems, heart murmur, heart rhythm changes, itching, menstrual pain, muscle pains/cramps, night sweats, nosebleeds, pregnancy symptoms, shortness of breath, skin rash, sore throat, swelling of feet/legs, swollen lymph glands, thirst - excessive , urination - excessive , urination changes/pain, urine leakage, vision changes, and voice change.    Vital Signs:  Patient profile:   60 year old female Height:      67 inches Weight:      194 pounds BMI:     30.49 Pulse rate:   72 / minute Pulse rhythm:   regular BP sitting:   106 / 70  (left arm) Cuff size:   regular  Vitals Entered By: June McMurray CMA Duncan Dull) (May 11, 2010 8:35 AM)  Physical Exam  General:  Well developed, well nourished, no acute distress.  Head:  Normocephalic and atraumatic. Eyes:  Conjunctiva pink, no icterus.  Neck:  no obvious masses  Lungs:  Clear throughout to auscultation. Heart:  Regular rate and rhythm; no murmurs, rubs,  or bruits. Abdomen:  Abdomen soft,  nondistended. Significant tenderness in LLQ. No obvious masses or  hepatomegaly.Normal bowel sounds.  Msk:  Symmetrical with no gross deformities. Normal posture. Extremities:  No palmar erythema, no edema.  Neurologic:  Alert and  oriented x4;  grossly normal neurologically. Skin:  Intact without significant lesions or rashes. Psych:  Alert and cooperative. Normal mood and affect.   Impression & Recommendations:  Problem # 1:  ABDOMINAL PAIN-LLQ (ICD-789.04) Probably recurrent diverticulitis. Cipro and Flagyl helped in the past so will repeat course of that. Low residue diet. Call for worsening pain, fevers <100.9.  Patient Instructions: 1)  We sent prescriptions for Cipro and Flagyl to [facility] 2)  Low residue diet over next few days. 3)  Ultram every 6 hours- we called in prescription for you. 4)  Copy sent to : Loree Fee PA 5)  Call us in 10 days with a progress report. 6)  In the meantime if  you experience fever or severe pain you call us. 7)  During off hours for our office you can call the MD on call if you need to.  Prescriptions: ULTRAM 50 MG TABS (TRAMADOL HCL) Take 1 tab every 6 hours as needed for pain  #30 x 0   Entered by:   Lowry Ram NCMA   Authorized by:   Willette Cluster NP   Signed by:   Lowry Ram NCMA on 05/11/2010   Method used:   [facility]ronically to        [facility] [phone]* (retail)       [address]       [place], [place]  [address]       Ph: [phone]       Fax: [phone]   RxID:   [id number] FLAGYL 500 MG TABS (METRONIDAZOLE) Take 1 tab 3 times daily x 10 days  #30 x 0   Entered by:   Lowry Ram NCMA   Authorized by:   Willette Cluster NP   Signed by:   Lowry Ram NCMA on 05/11/2010   Method used:   [facility]        [facility] [phone][facility] (retail)       [address]       [place], [place]  [address]       Ph: [phone]       Fax: [phone]   RxID:   [id number] CIPRO 500 MG TABS (CIPROFLOXACIN HCL) Take 1  tab twice daily x 10 days  #20 x 0   Entered by:   Lowry Ram NCMA   Authorized by:   Willette Cluster NP   Signed by:   Lowry Ram NCMA on 05/11/2010   Method used:   [facility]        [facility] [phone]* (retail)       [address]       [place], [place]  [address]       Ph: [phone]       Fax: [phone]   RxID:   [id number]

## 2010-05-31 NOTE — Progress Notes (Signed)
Summary: traige  Phone Note Call from Patient Call back at [phone]   Caller: Patient Call For: Teresa Vasquez Reason for Call: Talk to Nurse Summary of Call: Patient saw Teresa Vasquez and finished her medication but she is still having problems and wants to speak with a nurse Initial call taken by: [place],  May 21, 2010 1:34 PM  Follow-up for Phone Call        Patient saw Willette Cluster, RNP 10 days ago for diverticular flare. She has finished the mediction yesterday but is not better. Still has lower abdominal pain. Describes the pain as "pressure dull, ache." States she is staying on her diet. Stool softners has helped the constipation. Please, advise Follow-up by: Jesse Fall RN,  May 21, 2010 2:35 PM  Additional Follow-up for Phone Call Additional follow up Details #1::        Per Teresa Vasquez, if patient has tried Hyoscyamine and it has not helped repeat CT. Spoke with patient and she has used it also. Will set up CT. See EPIC. Additional Follow-up by: Jesse Fall RN,  May 22, 2010 3:37 PM

## 2010-06-25 ENCOUNTER — Ambulatory Visit (INDEPENDENT_AMBULATORY_CARE_PROVIDER_SITE_OTHER): Payer: BC Managed Care – PPO | Admitting: Physician Assistant

## 2010-06-25 ENCOUNTER — Encounter: Payer: Self-pay | Admitting: Physician Assistant

## 2010-06-25 ENCOUNTER — Other Ambulatory Visit (INDEPENDENT_AMBULATORY_CARE_PROVIDER_SITE_OTHER): Payer: BC Managed Care – PPO

## 2010-06-25 ENCOUNTER — Telehealth: Payer: Self-pay | Admitting: Gastroenterology

## 2010-06-25 VITALS — BP 110/74 | HR 68 | Temp 98.7°F | Ht 67.0 in | Wt 190.4 lb

## 2010-06-25 DIAGNOSIS — R1031 Right lower quadrant pain: Secondary | ICD-10-CM

## 2010-06-25 DIAGNOSIS — R1032 Left lower quadrant pain: Secondary | ICD-10-CM

## 2010-06-25 DIAGNOSIS — K5732 Diverticulitis of large intestine without perforation or abscess without bleeding: Secondary | ICD-10-CM

## 2010-06-25 LAB — URINALYSIS
Bilirubin Urine: NEGATIVE
Hgb urine dipstick: NEGATIVE
Ketones, ur: NEGATIVE
Leukocytes, UA: NEGATIVE
Urobilinogen, UA: 0.2 (ref 0.0–1.0)
pH: 6 (ref 5.0–8.0)

## 2010-06-25 LAB — BASIC METABOLIC PANEL
BUN: 13 mg/dL (ref 6–23)
Chloride: 102 mEq/L (ref 96–112)
Creatinine, Ser: 0.8 mg/dL (ref 0.4–1.2)
Glucose, Bld: 83 mg/dL (ref 70–99)
Potassium: 4.9 mEq/L (ref 3.5–5.1)

## 2010-06-25 LAB — CBC WITH DIFFERENTIAL/PLATELET
Basophils Absolute: 0 10*3/uL (ref 0.0–0.1)
Eosinophils Absolute: 0.1 10*3/uL (ref 0.0–0.7)
HCT: 41.4 % (ref 36.0–46.0)
Hemoglobin: 14.6 g/dL (ref 12.0–15.0)
Lymphs Abs: 1.2 10*3/uL (ref 0.7–4.0)
MCHC: 35.2 g/dL (ref 30.0–36.0)
MCV: 92.3 fl (ref 78.0–100.0)
Monocytes Absolute: 0.3 10*3/uL (ref 0.1–1.0)
Neutro Abs: 2.6 10*3/uL (ref 1.4–7.7)
Platelets: 174 10*3/uL (ref 150.0–400.0)
RDW: 12.7 % (ref 11.5–14.6)

## 2010-06-25 MED ORDER — FLUCONAZOLE 150 MG PO TABS: 150.0000 mg | ORAL_TABLET | Freq: Once | ORAL | Status: AC

## 2010-06-25 MED ORDER — METRONIDAZOLE 500 MG PO TABS: 500.0000 mg | ORAL_TABLET | Freq: Two times a day (BID) | ORAL | Status: AC

## 2010-06-25 MED ORDER — ALIGN 4 MG PO CAPS: 4.0000 mg | ORAL_CAPSULE | Freq: Every day | ORAL | Status: DC

## 2010-06-25 MED ORDER — HYDROCODONE-ACETAMINOPHEN 5-500 MG PO TABS: 1.0000 | ORAL_TABLET | Freq: Every day | ORAL | Status: DC

## 2010-06-25 MED ORDER — ONDANSETRON HCL 4 MG PO TABS: 4.0000 mg | ORAL_TABLET | Freq: Every day | ORAL | Status: AC | PRN

## 2010-06-25 MED ORDER — CIPROFLOXACIN HCL 500 MG PO TABS: 500.0000 mg | ORAL_TABLET | Freq: Two times a day (BID) | ORAL | Status: AC

## 2010-06-25 NOTE — Progress Notes (Signed)
Agree with assessment and plans as outlined 

## 2010-06-25 NOTE — Telephone Encounter (Signed)
Pt states she has been having pain on the right side of her abdomen and nausea for several days. Pt would like to be seen today. Appt made with Mike Gip PA for 06/25/10@10 :30am.

## 2010-06-25 NOTE — Patient Instructions (Addendum)
We have sent prescriptions to your pharmacy for Cipro, Flagyl , Zofran and Diflucan. We are making an appointment for you to see a surgeon at [facility]. We will call you with that appointment today once they call us back.  We have given you samples to take of Align, 1 capsule daily for 4 weeks. We have faxed the vicodin prescription to the pharmacy. [facility] [address].

## 2010-06-25 NOTE — Progress Notes (Signed)
Subjective:    Patient ID: Teresa Vasquez, female    DOB: 05/08/1950, 60 y.o.   MRN: [id number]  HPI Carmel is a pleasant 60 year old white female known to Dr. Oscar La. She has history of fairly severe diverticular disease and hyperplastic polyps. She was last colonoscoped in March of 2011 with finding of moderate sigmoid diverticular disease and one polyp. She did have an episode of diverticulitis approximately one year ago, which was documented on CT scan. She had onset of similar symptoms in March of 2012 and was seen by her primary care physician and treated again with Cipro and Flagyl. She says at that time she had left lower quadrant pain which did respond to the antibiotics. She had CT scan of the abdomen and pelvis done on 05/23/2010 which showed severe diverticulosis in the sigmoid colon but no evidence of diverticulitis noted no other inflammatory process she is status post hysterectomy had normal appendix and a normal-appearing gallbladder no evidence of adnexal mass.  She reports that her symptoms resolved after the course of antibiotics but about a week later she had recurrence of lower abdominal pain which has been present now over the past 2 weeks. She says this pain feels different and is more right-sided and throbbing and pressure-like in nature. She She says she generally just does not feel good and has been somewhat nauseated. She has not had any fever chills or sweats. Her bowel movements have been normal without melena or hematochezia. She complains of feeling bloated. Her appetite has been decreased. She says yesterday was her worse today and she stayed in bed most of the day.    Review of Systems  Constitutional: Positive for fatigue.  HENT: Negative.   Eyes: Negative.   Respiratory: Negative.   Cardiovascular: Negative.   Gastrointestinal: Positive for abdominal pain.  Genitourinary: Positive for frequency.  Musculoskeletal: Negative.   Skin: Negative.   Neurological:  Negative.   Hematological: Negative.   Psychiatric/Behavioral: Negative.        Objective:   Physical Exam Well-developed white female in no acute distress. She is afebrile. HEENT; nontraumatic normocephalic EOMI PERRLA sclera anicteric  Neck ;supple no JVD Cardiovascular regular rate and rhythm with S1-S2 no murmur rub or gallop  Pulmonary; clear bilaterally  Abdomen; soft bowel sounds present, she is quite tender in the left lower quadrant and less tender in the suprapubic and right lower quadrant areas, no guarding no rebound no palpable mass or hepatosplenomegaly  Rectal; not done  Skin; multiple cherry Angiomas Psych ;mood and affect normal and appropriate, somewhat tearful       Assessment & Plan:  #20 60 year old white female with known severe sigmoid diverticulosis, and prior history of diverticulitis. Now with two-week history of persistent lower abdominal pain more right-sided. Current pain occurring within 2 weeks of treatment for diverticulitis. I suspect this is smoldering diverticulitis which did not completely resolve with the previous course of antibiotics. She did not have any evidence of stricture at the time of her prior colonoscopy in 2011.  Plan; Will check CBC CRP and be met today Restart Cipro 500 mg twice daily x14 days Restart Flagyl 500 mg twice daily x14 days Start trial of Align  one daily x30 days Zofran 4 mg every 6 hours as needed for nausea Vicodin 5 501 every 6 hours as needed for pain. We discussed repeat imaging, and surgical consultation. We'll try toavoid repeat CT scan, she will call in 3 days and if symptoms are not improved or  it if at any point her symptoms worsen then we will obtain CT abdomen and pelvis. Also she is interested in surgical consultation, and is frustrated with the recurrent nature of her symptoms, we will obtain a surgical appointment for her. She also has problems with recurrent vaginal candidiasis with antibiotic use and will give her  Diflucan 150 mg to use x1 should she have recurrent symptoms.

## 2010-06-26 ENCOUNTER — Telehealth: Payer: Self-pay | Admitting: *Deleted

## 2010-06-26 ENCOUNTER — Telehealth: Payer: Self-pay | Admitting: Physician Assistant

## 2010-06-26 NOTE — Telephone Encounter (Signed)
Message copied by Graciella Freer on Tue Jun 26, 2010  3:56 PM ------      Message from: [place], [place]      Created: Tue Jun 26, 2010  1:19 PM       PLEASE LET Teresa Vasquez KNOW HER LABS ARE NORMAL

## 2010-06-26 NOTE — Telephone Encounter (Signed)
LMOM for pt to return our call. Labs are normal per Dr Juanda Chance.

## 2010-06-26 NOTE — Telephone Encounter (Signed)
I spoke to the pt and [facility] says they have not gotten my fax for the Vicodin RX.  I faxed it twice to [phone]. She told me they are always so busy and asked if I would please fax it to [facility] [address]  [facility] [phone].  I called to verify they got my fax. I apologized for her inconvenience and she accepted that and was fine.

## 2010-06-27 ENCOUNTER — Telehealth: Payer: Self-pay | Admitting: *Deleted

## 2010-06-27 NOTE — Telephone Encounter (Signed)
Message copied by Jesse Fall on Wed Jun 27, 2010  9:07 AM ------      Message from: [place], [place]      Created: Tue Jun 26, 2010  1:19 PM       PLEASE LET Teresa Vasquez KNOW HER LABS ARE NORMAL

## 2010-06-27 NOTE — Telephone Encounter (Signed)
Spoke with patient and gave her lab results. Also, faxed records to [facility]

## 2010-06-27 NOTE — Telephone Encounter (Signed)
Left message for patient to call me

## 2010-06-28 NOTE — Telephone Encounter (Signed)
LMOM for pt to call back for lab results. Labs are normal per Dr. Juanda Chance.

## 2010-07-02 NOTE — Telephone Encounter (Signed)
Left a message for patient to call me. 

## 2010-07-03 ENCOUNTER — Inpatient Hospital Stay (HOSPITAL_COMMUNITY)
Admission: RE | Admit: 2010-07-03 | Discharge: 2010-07-06 | DRG: 149 | Disposition: A | Discharge status: Home / Self Care | Payer: BC Managed Care – PPO | Source: Ambulatory Visit | Attending: General Surgery | Admitting: General Surgery

## 2010-07-03 ENCOUNTER — Other Ambulatory Visit: Payer: Self-pay | Admitting: General Surgery

## 2010-07-03 DIAGNOSIS — K5732 Diverticulitis of large intestine without perforation or abscess without bleeding: Principal | ICD-10-CM | POA: Diagnosis present

## 2010-07-03 DIAGNOSIS — F411 Generalized anxiety disorder: Secondary | ICD-10-CM | POA: Diagnosis present

## 2010-07-03 DIAGNOSIS — E669 Obesity, unspecified: Secondary | ICD-10-CM | POA: Diagnosis present

## 2010-07-03 LAB — CBC
Hemoglobin: 12.8 g/dL (ref 12.0–15.0)
MCH: 30.2 pg (ref 26.0–34.0)
MCHC: 33 g/dL (ref 30.0–36.0)
RDW: 12.2 % (ref 11.5–15.5)

## 2010-07-03 LAB — SURGICAL PCR SCREEN: Staphylococcus aureus: NEGATIVE

## 2010-07-03 LAB — TYPE AND SCREEN: ABO/RH(D): O POS

## 2010-07-04 LAB — CBC
Hemoglobin: 12.6 g/dL (ref 12.0–15.0)
MCH: 30.2 pg (ref 26.0–34.0)
RBC: 4.17 MIL/uL (ref 3.87–5.11)

## 2010-07-04 LAB — BASIC METABOLIC PANEL
CO2: 27 mEq/L (ref 19–32)
Chloride: 105 mEq/L (ref 96–112)
Creatinine, Ser: 0.72 mg/dL (ref 0.4–1.2)
GFR calc Af Amer: >60 mL/min (ref >60)
Sodium: 141 mEq/L (ref 135–145)

## 2010-07-04 NOTE — Telephone Encounter (Signed)
Left a message for patient that her labs are normal and to call for questions.

## 2010-07-04 NOTE — Op Note (Signed)
Teresa Vasquez, OERTEL              ACCOUNT NO.:  [id number]  MEDICAL RECORD NO.:  [id number]           PATIENT TYPE:  I  LOCATION:  1523                         FACILITY:  [facility]  PHYSICIAN:  Sharlet Salina T. Manjit Bufano, M.D.DATE OF BIRTH:  12/24/1950  DATE OF PROCEDURE:  07/03/2010 DATE OF DISCHARGE:                              OPERATIVE REPORT   PREOPERATIVE DIAGNOSIS:  Chronic diverticulitis, sigmoid colon.  POSTOPERATIVE DIAGNOSIS:  Chronic diverticulitis, sigmoid colon.  SURGICAL PROCEDURES:  Laparoscopic sigmoid colectomy.  SURGEON:  Lorne Skeens. Teresa Vasquez, M.D.  ASSISTANTS:  Adolph Pollack, M.D. and Anselm Pancoast. Zachery Dakins, M.D.  ANESTHESIA:  General.  BRIEF HISTORY:  [name] is a 60 year old female initially diagnosed with diverticulitis of the sigmoid colon about a year ago when she presented with an acute pain.  She had a CT scan documenting marked sigmoid diverticulosis, and colonoscopy confirmed this.  CT showed some significant colonic inflammation at that time as well.  She was treated with oral antibiotics with improvement but states she has continued to feel somewhat unwell, fatigued, and some mild abdominal discomfort ever since.  In recent weeks, she has developed recurrent left lower quadrant abdominal pain. This has been a very significant problem for her ongoing despite treatment with oral antibiotics.  Repeat CT scan has shown marked diverticulosis of the sigmoid colon but no severe inflammation.  Exam has shown no significant left lower quadrant tenderness.  After discussion of options, we have elected to proceed with laparoscopic resection of her sigmoid colon.  Alternatives, risks of general anesthesia, bleeding, infection, anastomotic leak, possible need for ostomy and possible need for open procedure were discussed and understood preoperative.  She has undergone a preoperative mechanical bowel prep and is now brought to the operating room for this  procedure.  DESCRIPTION OF OPERATION:  The patient was brought to operating room, placed in supine position on the operating table on a gel mat and general endotracheal anesthesia was induced. She was carefully placed in padded yellow Yellowfin stirrups.  He was placed in a modified lithotomy position.  Foley catheter was placed.  She received preoperative IV antibiotics.  PS were placed.  The abdomen and perineum were widely sterilely prepped and draped and correct patient and procedure were verified. Access was obtained with a 1 cm incision just beneath the umbilicus with open Hassan technique through mattress suture of Vicryl and pneumoperitoneum established.  Under direct vision, a 12 mm trocar was placed in the right lower quadrant at 2 cm above and medial to the iliac crest and a 5-mm trocar was placed in identical position on the left side.  Additional 5-mm trocar was placed about a hands breadth above the 12-mm trocar in the right midabdomen.  Laparoscopy showed some significant chronic inflammation of the sigmoid colon with Kerlix and extensive adhesions up to the abdominal and pelvic sidewall. The base of the sigmoid colon mesentery was elevated and exposed and sacral promontory identified.  The peritoneum just above this was incised along the base of the sigmoid mesentery superiorly inferiorly.  Careful blunt dissection was then carried posterior to the mesentery in a nice avascular plane  and working out laterally, the ureter and iliac vessels were easily identified. The ureter was swept posteriorly and completely protected and identified all along its course.  The retroperitoneal dissection was then continued up superiorly and the inferior mesenteric pedicle was identified and it was dissected out and a window created just above this preparation for ligation.  The inferior mesenteric vessel was then taken just distal to the left colic vessel with the harmonic scalpel, and the  pedicle was then additionally tied with a Vicryl Endoloop.  The medial peritoneum extending up superiorly and was further incised and further blunt dissection carried out completely mobilizing the mesentery of the left colon up toward the splenic flexure and out toward the lateral abdominal wall.  This dissection was then carried back down more inferiorly, again mobilizing the mesentery of the left sigmoid colon completely out to the line of Toldt from medial to lateral again carefully protecting the ureter throughout.  After a complete medial dissection began taking down lateral inflammatory adhesions and mobilized the sigmoid out of the pelvis where there were some adhesions to the left tube and ovary as well as to the pelvic brim. The line of Toldt could then be exposed which at this point was just the thickness of peritoneum and this was incised from the pelvis up toward the splenic flexure completely mobilizing the left and sigmoid colon. At this point, the rectosigmoid was exposed by pulling the distal sigmoid up out of the pelvis.  Peritoneum on either side of the rectosigmoid was incised and I chose an area for distal resection and with the tinea began to disappear and the bowel was clearly soft without any further diverticula.  The mesentery was dissected from the right side of the rectosigmoid at this point and using harmonic dissection and then careful blunt dissection the mesentery was dissected away from the rectosigmoid and a window opened over to the left side.  The rectosigmoid was then divided with two firings of the echelon 60-mm blue load stapler with a second firing required just to divide the last 1/2 cm set over toward the right side.  Following this, the distal end of the specimen was elevated and the mesentery of the rectosigmoid was divided sequentially with the harmonic scalpel again carefully protecting and identifying the ureter throughout its course until  the mesentery was completely divided up to previously dissected area at the IMA.  This point, the left lower quadrant 5 mm trocar was removed and a 4 cm extraction incision was made obliquely in the left lower quadrant. A muscle-splitting incision was used and the peritoneum entered.  The wound protector was placed and the distal end of the specimen brought up through the wound protector, and we had very good mobility up to the mid- left colon.  A point of resection at the mid to distal left colon was chosen with the bowel was entirely soft and normal and came down at least to the pubis for adequate length.  The mesentery at this point was sequentially divided between clamps and tied with 2-0 silk ties and the bowel cleaned of mesenteric pericolic fat.  It was clamped with the pursestring clamp and a 2-0 Prolene pursestring suture placed, and the bowel divided and specimen removed.  The pursestring was intact.  The bowel was healthy and easily administered a 29-mm anvil which was placed and the pursestring secured.  This end was then placed back down to the extraction incision which was then closed in two layers  with running zero PDS suture.  We then reinsufflated and laparoscopically, the anvil was seen to reach down easily to the rectal stump without any tension lay there on its own.  Dr. Abbey Chatters went below and the 29-mm EEA stapler was passed transanally without difficulty up to the staple line at the rectosigmoid.  Under direct vision, the spike was advanced just immediately posterior to the mid staple line.  The anvil was attached and excluding any extraneous tissue and making sure that the left colon was not twisted, we closed the stapler. It was left in place for hemostasis and fired and removed without difficulty.  There were two complete thick doughnuts identified from the stapler.  The anastomosis looked fine.  This point,  Dr. Abbey Chatters went below and insufflation test was  performed.  Under saline irrigation only with the bowel fairly tensely distended, there was a very tiny almost pinhole size stream of bubbles coming from the mid anterior staple line between two staples. There did not appear to be any gap for disruption here at all.  The site could be easily visualized and exposed and I placed two 2-0 Vicryl sutures across the anastomosis at this point.  Following this insufflation test was repeated and with the bowel tensely distended repeatedly under saline irrigation there was no evidence of leak.  The anastomosis did not appear to be under any tension, but there was really not any extra length to the left colon and therefore we did place at this point an additional 5-mm trocar in the left flank and the more proximal left colon was mobilized dividing the peritoneal reflection up to the tip of the spleen and further mobilizing the mesentery bluntly of the proximal left colon.  At this point, the colon just proximal anastomosis was seen to be definitely floppy with free play in it.  I do not feel again it was necessary to takedown splenic flexure.  The operative field was inspected for hemostasis and appeared complete. There was no evidence of trocar injury or other problems.  All CO2 was evacuated and trocars removed.  The mattress sutures secured at the umbilical site.  Skin incisions were closed with subcuticular Monocryl and Dermabond.  Sponges and needle counts correct.  The patient was taken to recovery in good condition.     Lorne Skeens. Philana Younis, M.D.     Tory Emerald  D:  07/03/2010  T:  07/04/2010  Job:  [id number]  Electronically Signed by Glenna Fellows M.D. on 07/04/2010 08:25:04 AM

## 2010-07-31 NOTE — Discharge Summary (Signed)
  Teresa Vasquez, Teresa Vasquez              ACCOUNT NO.:  [id number]  MEDICAL RECORD NO.:  [id number]           PATIENT TYPE:  I  LOCATION:  1523                         FACILITY:  [facility]  PHYSICIAN:  Sharlet Salina T. Ledger Heindl, M.D.DATE OF BIRTH:  [birth year]  DATE OF ADMISSION:  07/03/2010 DATE OF DISCHARGE:  07/06/2010                              DISCHARGE SUMMARY   DISCHARGE DIAGNOSIS:  Chronic diverticulitis of sigmoid colon.  OPERATIONS AND PROCEDURES:  Laparoscopic sigmoid colectomy, Dr. Johna Sheriff, Jul 03, 2010.  HISTORY OF PRESENT ILLNESS:  Teresa Vasquez is a 60 year old female with recurrent sigmoid diverticulitis documented by CT scan with recent ongoing symptoms recurred after initial episode last year and not responding to medical management.  She has had a thorough workup by [facility] including colonoscopy and CT scan indicating chronic sigmoid diverticulitis.  After discussion in the office, we have elected to proceed with elective laparoscopic sigmoid colectomy for relief of symptoms.  She was admitted for this procedure.  PAST MEDICAL HISTORY:  Previous surgery includes vaginal hysterectomy in 1993.  Medically, she is generally otherwise healthy, treated only for mild anxiety.  MEDICATIONS:  Alprazolam but currently on Cipro and Flagyl at time of admission for this illness.  ALLERGIES:  None.  Social history, family history, review of systems, see detailed H and P.  Per physical exam, she has moderate tenderness in the left lower quadrant, otherwise negative and appears healthy.  HOSPITAL COURSE:  The patient was admitted on the morning of procedure. She underwent mechanical bowel prep at home.  She underwent an uneventful laparoscopic sigmoid colectomy.  Her postoperative course was very smooth.  She had minimal pain.  CBC remained within normal limits. She started clear liquids on the first postoperative day and was able to be advanced fairly quickly to a regular diet.   She was having flatus by the second postoperative day.  She was discharged home on third postoperative day. Tolerating regular diet.  Abdomen soft and nontender.  Incisions healing well.  Final pathology showed diverticulosis and diverticulitis. Followup is to be in my office in 1 week.  Medications are Vicodin as needed for pain.     Teresa Vasquez. Teresa Vasquez, M.D.     Teresa Vasquez  D:  07/17/2010  T:  07/18/2010  Job:  [id number]  cc:   Teresa Hair. Arlyce Dice, MD,FACG  Electronically Signed by Teresa Vasquez M.D. on 07/31/2010 10:03:17 AM

## 2010-08-14 ENCOUNTER — Telehealth: Payer: Self-pay | Admitting: Gastroenterology

## 2010-08-14 NOTE — Telephone Encounter (Signed)
Returned call but got no answer.  Pt states that since her surgery she has been having problems with hot flashes, chills and nausea. They suggested she call her GYN for something. Pt calls here wanting to know what we suggest. Pt has had a hysterectomy. Instructed pt to call her GYN MD to discuss options available for treatment. Pt states she has an appt with her GYN in 2 weeks.

## 2010-08-15 ENCOUNTER — Other Ambulatory Visit: Payer: Self-pay | Admitting: Obstetrics and Gynecology

## 2010-09-06 ENCOUNTER — Telehealth (INDEPENDENT_AMBULATORY_CARE_PROVIDER_SITE_OTHER): Payer: Self-pay | Admitting: General Surgery

## 2010-09-13 ENCOUNTER — Ambulatory Visit (INDEPENDENT_AMBULATORY_CARE_PROVIDER_SITE_OTHER): Payer: BC Managed Care – PPO | Admitting: General Surgery

## 2010-09-13 ENCOUNTER — Encounter (INDEPENDENT_AMBULATORY_CARE_PROVIDER_SITE_OTHER): Payer: Self-pay | Admitting: General Surgery

## 2010-09-13 VITALS — BP 116/78 | HR 68 | Temp 97.1°F | Resp 16 | Ht 67.0 in | Wt 179.0 lb

## 2010-09-13 DIAGNOSIS — Z9889 Other specified postprocedural states: Secondary | ICD-10-CM

## 2010-09-13 NOTE — Patient Instructions (Signed)
All as needed.

## 2010-09-13 NOTE — Progress Notes (Signed)
Patient returns for more long-term followup now 2-1/2 months following laparoscopic colectomy for recurrent diverticulitis. She continues to feel gradually better. She still feels somewhat fatigued but this is improving. She gets some occasional mild pressure in her lower abdomen and occasional mild constipation that resolves with MiraLax but this is getting better as well. She has had no symptoms to indicate recurrent diverticulitis.  On examination she appears well. Her abdomen is soft and nontender and wounds all well healed.  At this point I think she is doing well without any complications identified. I plan to see her back as needed and I urged her to call me if she has any concerns.

## 2012-01-05 ENCOUNTER — Encounter (HOSPITAL_COMMUNITY): Payer: Self-pay | Admitting: Emergency Medicine

## 2012-01-05 ENCOUNTER — Emergency Department (HOSPITAL_COMMUNITY)
Admission: EM | Admit: 2012-01-05 | Discharge: 2012-01-05 | Disposition: A | Discharge status: Home / Self Care | Payer: BC Managed Care – PPO | Source: Home / Self Care | Attending: Emergency Medicine | Admitting: Emergency Medicine

## 2012-01-05 DIAGNOSIS — J329 Chronic sinusitis, unspecified: Secondary | ICD-10-CM

## 2012-01-05 MED ORDER — FEXOFENADINE-PSEUDOEPHED ER 60-120 MG PO TB12: 1.0000 | ORAL_TABLET | Freq: Two times a day (BID) | ORAL | Status: DC

## 2012-01-05 MED ORDER — AMOXICILLIN-POT CLAVULANATE 500-125 MG PO TABS: 1.0000 | ORAL_TABLET | Freq: Two times a day (BID) | ORAL | Status: DC

## 2012-01-05 MED ORDER — GUAIFENESIN-CODEINE 100-10 MG/5ML PO SYRP: 5.0000 mL | ORAL_SOLUTION | Freq: Three times a day (TID) | ORAL | Status: DC | PRN

## 2012-01-05 NOTE — ED Notes (Signed)
Pt dx 3 wk ago with sinus infection was prescribed z pak and cough syrup. Symptoms were relieved x 1wk but has gradually came back after finishing med.  Pt states hx of sinus problems. C/o itchy ears, dry non productive cough, facial pain, HA, and nausea.  Pt denies vomiting/diarrhea/fever.  "just feel icky"

## 2012-01-05 NOTE — ED Provider Notes (Signed)
History     CSN: [id number]  Arrival date & time 01/05/12  4098   First MD Initiated Contact with Patient 01/05/12 [phone]      Chief Complaint  Patient presents with  . Facial Pain    dx 3 wks ago with sinus infection. was prescibed z pak and cough syrup. symptoms relieved x 1wk but gradually came back.    (Consider location/radiation/quality/duration/timing/severity/associated sxs/prior treatment) HPI Comments: Patient presents to urgent care this morning complaining of worsening ongoing sinus congestion she describes pressure in both of her for head and maxillary regions. And describing postnasal drainage that is eliciting a dry cough. It is somewhat bothersome. She has been having the symptoms for about a week and describes it initially last week she has seen her primary care Dr. and was prescribed a course of Z-Pak and a cough syrup which seemed to have helped at that time. At first symptoms have since then exacerbated. She denies any pressure, vomiting or fevers. She's just feels achy some body aches at some point have become nauseous. Denies any further symptoms such as visual changes, numbness, tingling sensation or weakness of upper lower extremity, facial expression changes or speech or gait problems .  The history is provided by the patient.    Past Medical History  Diagnosis Date  . Anxiety and depression   . Diverticulitis   . Irritable bowel syndrome   . Depression     Past Surgical History  Procedure Date  . Tonsillectomy   . Vaginal hysterectomy   . Colon surgery     Family History  Problem Relation Age of Onset  . Heart disease Father   . Heart disease Mother   . Heart attack Brother   . Irritable bowel syndrome Sister     History  Substance Use Topics  . Smoking status: Never Smoker   . Smokeless tobacco: Never Used  . Alcohol Use: No    OB History    Grav Para Term Preterm Abortions TAB SAB Ect Mult Living                  Review of Systems    Constitutional: Positive for appetite change. Negative for diaphoresis and activity change.  HENT: Positive for ear pain, congestion, dental problem, postnasal drip and sinus pressure. Negative for neck pain and neck stiffness.   Eyes: Negative for photophobia, pain and redness.  Respiratory: Positive for cough. Negative for shortness of breath and wheezing.   Cardiovascular: Negative for chest pain.  Neurological: Positive for headaches. Negative for dizziness, weakness and numbness.    Allergies  Review of patient's allergies indicates no known allergies.  Home Medications   Current Outpatient Rx  Name  Route  Sig  Dispense  Refill  . ALPRAZOLAM 1 MG PO TABS   Oral   Take 1 mg by mouth as needed. Take 1/2 to 1 tablet by mouth as needed          . AMOXICILLIN-POT CLAVULANATE 500-125 MG PO TABS   Oral   Take 1 tablet (500 mg total) by mouth 2 (two) times daily.   20 tablet   0   . ASPIRIN 81 MG PO TABS   Oral   Take 81 mg by mouth daily.           Marland Kitchen FEXOFENADINE-PSEUDOEPHED ER 60-120 MG PO TB12   Oral   Take 1 tablet by mouth every 12 (twelve) hours.   30 tablet   0   .  GUAIFENESIN-CODEINE 100-10 MG/5ML PO SYRP   Oral   Take 5 mLs by mouth 3 (three) times daily as needed for cough.   120 mL   0   . HYDROCODONE-ACETAMINOPHEN 5-500 MG PO TABS   Oral   Take 1 tablet by mouth at bedtime.   25 tablet   0   . HYOSCYAMINE SULFATE 0.375 MG PO TB12   Oral   Take 0.375 mg by mouth 2 (two) times daily as needed. For abdominal cramping and spasms          . ALIGN 4 MG PO CAPS   Oral   Take 4 mg by mouth daily.   1 capsule   0     We have given you samples of Align lot #[id number] ...   . TRAMADOL HCL 50 MG PO TABS   Oral   Take 50 mg by mouth every 6 (six) hours as needed. 1 tab every 6 hours as needed for pain          . VITAMIN B-12 100 MCG PO TABS   Oral   Take 50 mcg by mouth daily. Take 2  50 MCG tablets          . VITAMIN D (ERGOCALCIFEROL)  50000 UNITS PO CAPS   Oral   Take 50,000 Units by mouth every 7 (seven) days.             BP 135/83  Pulse 74  Temp 98.5 F (36.9 C) (Oral)  Resp 16  SpO2 100%  Physical Exam  Nursing note and vitals reviewed. Constitutional: Vital signs are normal. She appears well-developed and well-nourished.  Non-toxic appearance. She does not have a sickly appearance. She does not appear ill. No distress.  HENT:  Head: Normocephalic.    Right Ear: Tympanic membrane normal. No drainage. No decreased hearing is noted.  Left Ear: Tympanic membrane normal. No drainage. No decreased hearing is noted.  Mouth/Throat: Uvula is midline, oropharynx is clear and moist and mucous membranes are normal. No oropharyngeal exudate, posterior oropharyngeal edema, posterior oropharyngeal erythema or tonsillar abscesses.  Eyes: Conjunctivae normal are normal.  Neck: Neck supple. No JVD present.  Cardiovascular: Normal rate.  Exam reveals no gallop and no friction rub.   No murmur heard. Pulmonary/Chest: Effort normal and breath sounds normal. She has no decreased breath sounds. She has no wheezes. She has no rhonchi. She has no rales.  Abdominal: Soft.  Lymphadenopathy:    She has no cervical adenopathy.  Neurological: She is alert.  Skin: Skin is warm. No rash noted.    ED Course  Procedures (including critical care time)  Labs Reviewed - No data to display No results found.   1. Sinusitis       MDM  Maxillary and frontal sinusitis. Postnasal drainage. Patient has been prescribed Allegra-D, clubbing with one of the same in a course of Augmentin. Encouraged as well to use a humidifier and increase her oral intake of fluids. Patient agrees with treatment plan and follow-up care as necessary if worsening symptoms or no improvement        Jimmie Molly, MD 01/05/12 1300

## 2012-02-23 ENCOUNTER — Emergency Department (HOSPITAL_COMMUNITY)
Admission: EM | Admit: 2012-02-23 | Discharge: 2012-02-23 | Disposition: A | Discharge status: Home / Self Care | Payer: BC Managed Care – PPO | Source: Home / Self Care

## 2012-02-23 ENCOUNTER — Encounter (HOSPITAL_COMMUNITY): Payer: Self-pay

## 2012-02-23 DIAGNOSIS — J069 Acute upper respiratory infection, unspecified: Secondary | ICD-10-CM

## 2012-02-23 DIAGNOSIS — J329 Chronic sinusitis, unspecified: Secondary | ICD-10-CM

## 2012-02-23 MED ORDER — PHENYLEPHRINE-CHLORPHEN-DM 10-4-12.5 MG/5ML PO LIQD: 5.0000 mL | ORAL | Status: DC | PRN

## 2012-02-23 MED ORDER — AZITHROMYCIN 250 MG PO TABS: 250.0000 mg | ORAL_TABLET | Freq: Every day | ORAL | Status: DC

## 2012-02-23 NOTE — ED Notes (Signed)
Reported continued issues ; HA, body aches

## 2012-02-23 NOTE — ED Provider Notes (Signed)
History     CSN: [id number]  Arrival date & time 02/23/12  1336   None     Chief Complaint  Patient presents with  . Facial Pain    (Consider location/radiation/quality/duration/timing/severity/associated sxs/prior treatment) HPI Comments: 61 year old female presents with facial pain for 2 weeks. She states that she was seen here been November for sinusitis and treated with a Z-Pak which helped her get better. For 3 months she has had facial pressure and discomfort. She has not been having a fever. She points to the periorbital/paranasal areas of the pain. She states that her gums CH and she has a headache, nausea without vomiting. She denies any fever. She has been taking Mucinex and an antihistamine but denies taking a decongestant for her most prominent symptom.   Past Medical History  Diagnosis Date  . Anxiety and depression   . Diverticulitis   . Irritable bowel syndrome   . Depression     Past Surgical History  Procedure Date  . Tonsillectomy   . Vaginal hysterectomy   . Colon surgery     Family History  Problem Relation Age of Onset  . Heart disease Father   . Heart disease Mother   . Heart attack Brother   . Irritable bowel syndrome Sister     History  Substance Use Topics  . Smoking status: Never Smoker   . Smokeless tobacco: Never Used  . Alcohol Use: No    OB History    Grav Para Term Preterm Abortions TAB SAB Ect Mult Living                  Review of Systems  Constitutional: Negative for fever, chills, activity change, appetite change and fatigue.  HENT: Positive for congestion and postnasal drip. Negative for facial swelling, rhinorrhea, neck pain and neck stiffness.        See history of present illness  Eyes: Negative.   Respiratory: Positive for cough. Negative for shortness of breath and stridor.   Cardiovascular: Negative.   Gastrointestinal: Negative.   Genitourinary: Negative.   Musculoskeletal: Negative.   Skin: Negative for  pallor and rash.  Neurological: Negative.     Allergies  Review of patient's allergies indicates no known allergies.  Home Medications   Current Outpatient Rx  Name  Route  Sig  Dispense  Refill  . ALPRAZOLAM 1 MG PO TABS   Oral   Take 1 mg by mouth as needed. Take 1/2 to 1 tablet by mouth as needed          . AMOXICILLIN-POT CLAVULANATE 500-125 MG PO TABS   Oral   Take 1 tablet (500 mg total) by mouth 2 (two) times daily.   20 tablet   0   . ASPIRIN 81 MG PO TABS   Oral   Take 81 mg by mouth daily.           . AZITHROMYCIN 250 MG PO TABS   Oral   Take 1 tablet (250 mg total) by mouth daily. 2 tabs po on day 1, 1 tab po on days 2-5   6 tablet   0   . FEXOFENADINE-PSEUDOEPHED ER 60-120 MG PO TB12   Oral   Take 1 tablet by mouth every 12 (twelve) hours.   30 tablet   0   . GUAIFENESIN-CODEINE 100-10 MG/5ML PO SYRP   Oral   Take 5 mLs by mouth 3 (three) times daily as needed for cough.   120 mL  0   . HYDROCODONE-ACETAMINOPHEN 5-500 MG PO TABS   Oral   Take 1 tablet by mouth at bedtime.   25 tablet   0   . HYOSCYAMINE SULFATE 0.375 MG PO TB12   Oral   Take 0.375 mg by mouth 2 (two) times daily as needed. For abdominal cramping and spasms          . PHENYLEPHRINE-CHLORPHEN-DM 12-06-10.5 MG/5ML PO LIQD   Oral   Take 5 mLs by mouth every 4 (four) hours as needed.   120 mL   0   . ALIGN 4 MG PO CAPS   Oral   Take 4 mg by mouth daily.   1 capsule   0     We have given you samples of Align lot #[id number] ...   . TRAMADOL HCL 50 MG PO TABS   Oral   Take 50 mg by mouth every 6 (six) hours as needed. 1 tab every 6 hours as needed for pain          . VITAMIN B-12 100 MCG PO TABS   Oral   Take 50 mcg by mouth daily. Take 2  50 MCG tablets          . VITAMIN D (ERGOCALCIFEROL) 50000 UNITS PO CAPS   Oral   Take 50,000 Units by mouth every 7 (seven) days.             BP 121/79  Pulse 87  Temp 98.2 F (36.8 C) (Oral)  Resp 18  SpO2  98%  Physical Exam  Nursing note and vitals reviewed. Constitutional: She is oriented to person, place, and time. She appears well-developed and well-nourished. No distress.  HENT:  Mouth/Throat: Oropharynx is clear and moist. No oropharyngeal exudate.       Bilateral TMs are normal Scant clear PND  Eyes: Conjunctivae normal are normal.  Neck: Normal range of motion. Neck supple.  Cardiovascular: Normal rate, regular rhythm and normal heart sounds.   Pulmonary/Chest: Effort normal and breath sounds normal. No respiratory distress. She has no wheezes.  Musculoskeletal: Normal range of motion. She exhibits no edema.  Lymphadenopathy:    She has no cervical adenopathy.  Neurological: She is alert and oriented to person, place, and time.  Skin: Skin is warm and dry. No rash noted.  Psychiatric: She has a normal mood and affect.    ED Course  Procedures (including critical care time)  Labs Reviewed - No data to display No results found.   1. URI (upper respiratory infection)   2. Sinusitis       MDM  Z-Pak as requested. She seems to think that this made her better the first time. Recommended decongestant. Have prescribed Norell CS 1 teaspoon every 4 hours when necessary cough and congestion. May cause drowsiness Drink plenty of fluids stay well hydrated Recommended she followup with your PCP for further assessment of your sinuses. He may need a referral to ENT but nevertheless a sinus workup which may include imaging may be beneficial. At least to help decide whether antibiotics may be necessary in the future similar conditions.         Hayden Rasmussen, NP 02/23/12 [phone]

## 2012-02-23 NOTE — ED Provider Notes (Signed)
Medical screening examination/treatment/procedure(s) were performed by non-physician practitioner and as supervising physician I was immediately available for consultation/collaboration.  Leslee Home, M.D.   Reuben Likes, MD 02/23/12 2018

## 2012-05-12 ENCOUNTER — Other Ambulatory Visit (HOSPITAL_COMMUNITY): Payer: Self-pay | Admitting: Internal Medicine

## 2012-05-12 ENCOUNTER — Ambulatory Visit (HOSPITAL_COMMUNITY)
Admission: RE | Admit: 2012-05-12 | Discharge: 2012-05-12 | Disposition: A | Discharge status: Home / Self Care | Payer: BC Managed Care – PPO | Source: Ambulatory Visit | Attending: Internal Medicine | Admitting: Internal Medicine

## 2012-05-12 DIAGNOSIS — R0989 Other specified symptoms and signs involving the circulatory and respiratory systems: Secondary | ICD-10-CM | POA: Insufficient documentation

## 2012-05-12 DIAGNOSIS — J9819 Other pulmonary collapse: Secondary | ICD-10-CM | POA: Insufficient documentation

## 2012-05-12 DIAGNOSIS — R0602 Shortness of breath: Secondary | ICD-10-CM

## 2012-05-12 DIAGNOSIS — J984 Other disorders of lung: Secondary | ICD-10-CM | POA: Insufficient documentation

## 2012-05-14 ENCOUNTER — Other Ambulatory Visit: Payer: Self-pay | Admitting: Otolaryngology

## 2012-05-14 ENCOUNTER — Ambulatory Visit
Admission: RE | Admit: 2012-05-14 | Discharge: 2012-05-14 | Disposition: A | Discharge status: Home / Self Care | Payer: BC Managed Care – PPO | Source: Ambulatory Visit | Attending: Otolaryngology | Admitting: Otolaryngology

## 2012-05-14 DIAGNOSIS — R0981 Nasal congestion: Secondary | ICD-10-CM

## 2012-06-05 ENCOUNTER — Ambulatory Visit: Payer: BC Managed Care – PPO | Admitting: Cardiovascular Disease

## 2012-07-31 ENCOUNTER — Encounter: Payer: Self-pay | Admitting: Cardiovascular Disease

## 2012-07-31 ENCOUNTER — Ambulatory Visit (INDEPENDENT_AMBULATORY_CARE_PROVIDER_SITE_OTHER): Payer: BC Managed Care – PPO | Admitting: Cardiovascular Disease

## 2012-07-31 VITALS — BP 130/83 | HR 67 | Ht 67.0 in | Wt 204.1 lb

## 2012-07-31 DIAGNOSIS — R0789 Other chest pain: Secondary | ICD-10-CM

## 2012-07-31 NOTE — Patient Instructions (Signed)
1) Your physician has requested that you have a lexiscan myoview. . Please follow instruction sheet, as given.  2) Your physician recommends that you schedule a follow-up appointment in: as needed basis depending on results.

## 2012-07-31 NOTE — Assessment & Plan Note (Signed)
Teresa Vasquez is referred today for further evaluation of a mildly abnormal EKG and some mild chest tightness. She's had episodes of chest tightness for the past several months. They seem to radiate all to her arms. They're not related with exertion or eating. It lasts for several minutes.  She does not have any symptoms with exertion. She does have T-wave inversions in the anterior leads.  At this point I think that we should proceed with a Lexiscan Myoview study.  I've encouraged her to continue with the diet and exercise program. She has a history of hyperlipidemia managed by her medical doctor.  I'll see her on an as-needed basis assuming that the stress test is normal. We'll see her back when necessary

## 2012-07-31 NOTE — Progress Notes (Signed)
Teresa Vasquez Date of Birth  03-Jan-1951       [facility] Office [address]  [address] [place], [place]  [address]   [place], [place]  [address] [phone]     [phone]   Fax  [phone]    Fax [phone]  Problem List: 1. Abnormal ECG 2.sinusitis.  3. IBS 4. Diverticulitis 5. hyperlipidemia   History of Present Illness:  Teresa Vasquez is a 62 yo with hx of chronic sinusitis.  She has come occasional chest tightness which seems to be better after she had sinus surgery.   She is active but does not get any regular exercise.  She does not have any chest pain or dyspnea doing her normal activities.  She has occasional episodes of indigestion - take OTC pepcid AC with relief.   She has occasional episodes of chest pressure.   They seem to radiate out to both arms.   These are not related to exertion or stress.  They last only a minute or so.   She had  An elevated cholesterol level. She was started on pravastatin   as well as red yeast rice.  Current Outpatient Prescriptions on File Prior to Visit  Medication Sig Dispense Refill  . ALPRAZolam (XANAX) 1 MG tablet Take 1 mg by mouth as needed. Take 1/2 to 1 tablet by mouth as needed       . aspirin 81 MG tablet Take 81 mg by mouth daily.        Marland Kitchen guaiFENesin-codeine (ROBITUSSIN AC) 100-10 MG/5ML syrup Take 5 mLs by mouth 3 (three) times daily as needed for cough.  120 mL  0  . Probiotic Product (ALIGN) 4 MG CAPS Take 4 mg by mouth daily.  1 capsule  0  . Vitamin D, Ergocalciferol, (DRISDOL) 50000 UNITS CAPS Take 50,000 Units by mouth every 7 (seven) days.         No current facility-administered medications on file prior to visit.    No Known Allergies  Past Medical History  Diagnosis Date  . Anxiety and depression   . Diverticulitis   . Irritable bowel syndrome   . Depression     Past Surgical History  Procedure Laterality Date  . Tonsillectomy    .  Vaginal hysterectomy    . Colon surgery      History  Smoking status  . Never Smoker   Smokeless tobacco  . Never Used    History  Alcohol Use No    Family History  Problem Relation Age of Onset  . Heart disease Father   . Heart disease Mother   . Heart attack Brother   . Irritable bowel syndrome Sister     Reviw of Systems:  Reviewed in the HPI.  All other systems are negative.  Physical Exam: Blood pressure 130/83, pulse 67, height 5\' 7"  (1.702 m), weight 204 lb 1.9 oz (92.588 kg). General: Well developed, well nourished, in no acute distress.  Head: Normocephalic, atraumatic, sclera non-icteric, mucus membranes are moist,   Neck: Supple. Carotids are 2 + without bruits. No JVD   Lungs: Clear   Heart: RR, normal  S1, S2.  Abdomen: Soft, non-tender, non-distended with normal bowel sounds.  Msk:  Strength and tone are normal   Extremities: No clubbing or cyanosis. No edema.  Distal pedal pulses are 2+ and equal    Neuro: CN II - XII intact.  Alert and  oriented X 3.   Psych:  Normal   ECG: Jul 31, 2012:  NSR at 36.  NS T wave abnormality  Assessment / Plan:

## 2012-08-04 ENCOUNTER — Ambulatory Visit (HOSPITAL_COMMUNITY): Payer: BC Managed Care – PPO | Attending: Cardiology | Admitting: Radiology

## 2012-08-04 VITALS — BP 123/75 | Ht 67.0 in | Wt 205.0 lb

## 2012-08-04 DIAGNOSIS — R0602 Shortness of breath: Secondary | ICD-10-CM

## 2012-08-04 DIAGNOSIS — R5381 Other malaise: Secondary | ICD-10-CM | POA: Insufficient documentation

## 2012-08-04 DIAGNOSIS — Z8249 Family history of ischemic heart disease and other diseases of the circulatory system: Secondary | ICD-10-CM | POA: Insufficient documentation

## 2012-08-04 DIAGNOSIS — R0989 Other specified symptoms and signs involving the circulatory and respiratory systems: Secondary | ICD-10-CM | POA: Insufficient documentation

## 2012-08-04 DIAGNOSIS — E785 Hyperlipidemia, unspecified: Secondary | ICD-10-CM | POA: Insufficient documentation

## 2012-08-04 DIAGNOSIS — R0609 Other forms of dyspnea: Secondary | ICD-10-CM | POA: Insufficient documentation

## 2012-08-04 DIAGNOSIS — R0789 Other chest pain: Secondary | ICD-10-CM | POA: Insufficient documentation

## 2012-08-04 DIAGNOSIS — R61 Generalized hyperhidrosis: Secondary | ICD-10-CM | POA: Insufficient documentation

## 2012-08-04 DIAGNOSIS — R9431 Abnormal electrocardiogram [ECG] [EKG]: Secondary | ICD-10-CM | POA: Insufficient documentation

## 2012-08-04 MED ORDER — AMINOPHYLLINE 25 MG/ML IV SOLN
75.0000 mg | Freq: Once | INTRAVENOUS | Status: AC
  Administered 2012-08-04: 75 mg via INTRAVENOUS

## 2012-08-04 MED ORDER — TECHNETIUM TC 99M SESTAMIBI GENERIC - CARDIOLITE
10.0000 | Freq: Once | INTRAVENOUS | Status: AC | PRN
  Administered 2012-08-04: 10 via INTRAVENOUS

## 2012-08-04 MED ORDER — TECHNETIUM TC 99M SESTAMIBI GENERIC - CARDIOLITE
30.0000 | Freq: Once | INTRAVENOUS | Status: AC | PRN
  Administered 2012-08-04: 30 via INTRAVENOUS

## 2012-08-04 MED ORDER — REGADENOSON 0.4 MG/5ML IV SOLN
0.4000 mg | Freq: Once | INTRAVENOUS | Status: AC
  Administered 2012-08-04: 0.4 mg via INTRAVENOUS

## 2012-08-04 NOTE — Progress Notes (Signed)
[facility] SITE 3 NUCLEAR MED [address] [place], [place] [address] [phone]    Cardiology Nuclear Med Study  Teresa Vasquez is a 62 y.o. female     MRN : [id number]     DOB: November 10, 1950  Procedure Date: 08/04/2012  Nuclear Med Background Indication for Stress Test:  Evaluation for Ischemia and Abnormal WUX:[id number] T wave abnormality History:  n/a Cardiac Risk Factors: Family History - CAD and Lipids  Symptoms:  Chest Pressure, Chest Tightness, Diaphoresis, DOE, Fatigue and SOB   Nuclear Pre-Procedure Caffeine/Decaff Intake:  None NPO After: 7:30am   Lungs:  clear O2 Sat: 99% on room air. IV 0.9% NS with Angio Cath:  22g  IV Site: R Antecubital x 1, tolerated well IV Started by:  Irean Hong, RN  Chest Size (in):  36 Cup Size: B  Height: 5\' 7"  (1.702 m)  Weight:  205 lb (92.987 kg)  BMI:  Body mass index is 32.1 kg/(m^2). Tech Comments:  This patient was very symptomatic with the Lexiscan injection. She was given Aminophylline 75 mg IV to reverse all the symptoms. She also had crackers and all her symptoms  Were relieved. S.Williams EMTP    Nuclear Med Study 1 or 2 day study: 1 day  Stress Test Type:  Treadmill/Lexiscan  Reading MD: Willa Rough, MD  Order Authorizing Provider:  Jannette Spanner  Resting Radionuclide: Technetium 61m Sestamibi  Resting Radionuclide Dose: 11.0 mCi   Stress Radionuclide:  Technetium 53m Sestamibi  Stress Radionuclide Dose: 33.0 mCi           Stress Protocol Rest HR: 57 Stress HR: 131  Rest BP: 123/75 Stress BP: 135/75  Exercise Time (min): n/a METS: n/a   Predicted Max HR: 161 bpm % Max HR: 81.37 bpm Rate Pressure Product: 36644   Dose of Adenosine (mg):  n/a Dose of Lexiscan: 0.4 mg  Dose of Atropine (mg): n/a Dose of Dobutamine: n/a mcg/kg/min (at max HR)  Stress Test Technologist: Milana Na, EMT-P  Nuclear Technologist:  Domenic Polite, CNMT     Rest Procedure:  Myocardial perfusion imaging was  performed at rest 45 minutes following the intravenous administration of Technetium 62m Sestamibi. Rest ECG: Normal sinus rhythm with nonspecific ST-T wave changes.  Stress Procedure:  The patient received IV Lexiscan 0.4 mg over 15-seconds with concurrent low level exercise and then Technetium 14m Sestamibi was injected at 30-seconds while the patient continued walking one more minute. This patient had sob, weakness, fatigue, chest pressure/tightness, and arms tired/heavy.  Quantitative spect images were obtained after a 45-minute delay. Stress ECG: No significant change from baseline ECG  QPS Raw Data Images:  Patient motion noted; appropriate software correction applied. Stress Images:  There is a moderate area of mild decreased activity that is partially reversible affecting the apical cap, apical inferolateral segment, and apical anterolateral segment. Rest Images:  Small area of mild decreased activity affecting part of the apical cap and the apical inferolateral segment. Subtraction (SDS):  There is some reversibility suggesting some ischemia. Transient Ischemic Dilatation (Normal <1.22):  1.05 Lung/Heart Ratio (Normal <0.45):  0.36  Quantitative Gated Spect Images QGS EDV:  88 ml QGS ESV:  43 ml  Impression Exercise Capacity:  Lexiscan with low level exercise. BP Response:  Normal blood pressure response. Clinical Symptoms:  shortness of breath. The patient did receive Aminophyllin. ECG Impression:  No significant ST segment change suggestive of ischemia. Comparison with Prior Nuclear Study: No images to compare  Overall Impression:  There is a small/moderate area of mild scar with mild ischemia affecting the apical cap, apical inferolateral segment, and apical anterolateral segment. It is possible that some of this could be related to shifting attenuation from the breast. However it seems more likely that there is possibly some mild scar with some mild peri-infarct ischemia. This is  a moderate risk scan.  LV Ejection Fraction: 51%.  LV Wall Motion:  There is mild decreased motion near the lateral apex.  Willa Rough, MD

## 2012-08-05 ENCOUNTER — Encounter: Payer: Self-pay | Admitting: *Deleted

## 2012-08-05 ENCOUNTER — Telehealth: Payer: Self-pay | Admitting: Cardiovascular Disease

## 2012-08-05 DIAGNOSIS — R9439 Abnormal result of other cardiovascular function study: Secondary | ICD-10-CM

## 2012-08-05 DIAGNOSIS — R079 Chest pain, unspecified: Secondary | ICD-10-CM

## 2012-08-05 MED ORDER — NITROGLYCERIN 0.4 MG SL SUBL: 0.4000 mg | SUBLINGUAL_TABLET | SUBLINGUAL | Status: DC | PRN

## 2012-08-05 NOTE — Telephone Encounter (Signed)
[facility] 08/14/12@8 :30, pt request Dr Elease Hashimoto do the cath.  pt advised to go to ED if cp continues with nitro use, pt verbalized understanding. Labs 6/10 set.

## 2012-08-05 NOTE — Telephone Encounter (Signed)
New problem    Calling for lexi scan results

## 2012-08-05 NOTE — Telephone Encounter (Signed)
Message copied by Antony Odea on Wed Aug 05, 2012  3:48 PM ------      Message from: Vesta Mixer      Created: Wed Aug 05, 2012  3:37 PM       The Celine Ahr shows a mild abnormality in the apex.  This could represent ischemia or may be an artifact., given her symptoms, I would favor doing a cardiac cath.  We can use my note as the H&P.  Set her up for cath this week or next.  I could do in the [facility] lab next Friday for sure ( June 13)  If she is still having some CP, we could call her in some NTG and she could do it sooner with someone else. ------

## 2012-08-11 ENCOUNTER — Other Ambulatory Visit (INDEPENDENT_AMBULATORY_CARE_PROVIDER_SITE_OTHER): Payer: BC Managed Care – PPO

## 2012-08-11 DIAGNOSIS — R9439 Abnormal result of other cardiovascular function study: Secondary | ICD-10-CM

## 2012-08-11 DIAGNOSIS — R079 Chest pain, unspecified: Secondary | ICD-10-CM

## 2012-08-11 LAB — BASIC METABOLIC PANEL
BUN: 12 mg/dL (ref 6–23)
CO2: 28 mEq/L (ref 19–32)
Chloride: 107 mEq/L (ref 96–112)
Creatinine, Ser: 0.8 mg/dL (ref 0.4–1.2)
Glucose, Bld: 84 mg/dL (ref 70–99)
Potassium: 4.1 mEq/L (ref 3.5–5.1)

## 2012-08-11 LAB — CBC
HCT: 39 % (ref 36.0–46.0)
Hemoglobin: 13.2 g/dL (ref 12.0–15.0)
MCHC: 33.7 g/dL (ref 30.0–36.0)
MCV: 91.6 fl (ref 78.0–100.0)
RDW: 13 % (ref 11.5–14.6)

## 2012-08-11 LAB — PROTIME-INR: Prothrombin Time: 11.6 s (ref 10.2–12.4)

## 2012-08-14 ENCOUNTER — Encounter (HOSPITAL_BASED_OUTPATIENT_CLINIC_OR_DEPARTMENT_OTHER)
Admission: RE | Disposition: A | Discharge status: Home / Self Care | Payer: Self-pay | Source: Ambulatory Visit | Attending: Cardiovascular Disease

## 2012-08-14 ENCOUNTER — Inpatient Hospital Stay (HOSPITAL_BASED_OUTPATIENT_CLINIC_OR_DEPARTMENT_OTHER)
Admission: RE | Admit: 2012-08-14 | Discharge: 2012-08-14 | Disposition: A | Discharge status: Home / Self Care | Payer: BC Managed Care – PPO | Source: Ambulatory Visit | Attending: Cardiovascular Disease | Admitting: Cardiovascular Disease

## 2012-08-14 DIAGNOSIS — R0789 Other chest pain: Secondary | ICD-10-CM | POA: Insufficient documentation

## 2012-08-14 DIAGNOSIS — R9431 Abnormal electrocardiogram [ECG] [EKG]: Secondary | ICD-10-CM | POA: Insufficient documentation

## 2012-08-14 DIAGNOSIS — E785 Hyperlipidemia, unspecified: Secondary | ICD-10-CM | POA: Insufficient documentation

## 2012-08-14 DIAGNOSIS — R079 Chest pain, unspecified: Secondary | ICD-10-CM

## 2012-08-14 DIAGNOSIS — R9439 Abnormal result of other cardiovascular function study: Secondary | ICD-10-CM | POA: Insufficient documentation

## 2012-08-14 SURGERY — JV LEFT HEART CATHETERIZATION WITH CORONARY ANGIOGRAM
Anesthesia: Moderate Sedation

## 2012-08-14 MED ORDER — ACETAMINOPHEN 325 MG PO TABS: 650.0000 mg | ORAL_TABLET | ORAL | Status: DC | PRN

## 2012-08-14 MED ORDER — ONDANSETRON HCL 4 MG/2ML IJ SOLN: 4.0000 mg | Freq: Four times a day (QID) | INTRAMUSCULAR | Status: DC | PRN

## 2012-08-14 MED ORDER — SODIUM CHLORIDE 0.9 % IV SOLN: INTRAVENOUS | Status: DC

## 2012-08-14 NOTE — OR Nursing (Signed)
Tegaderm dressing applied, site level 0, bedrest begins at 0905 

## 2012-08-14 NOTE — CV Procedure (Signed)
    Cardiac Cath Note  Teresa Vasquez [id number] 11/01/1950  Procedure: Left  Heart Cardiac Catheterization Note Indications:  CP, abnormal stress myoview  Procedure Details Consent: Obtained Time Out: Verified patient identification, verified procedure, site/side was marked, verified correct patient position, special equipment/implants available, Radiology Safety Procedures followed,  medications/allergies/relevent history reviewed, required imaging and test results available.  Performed   Medications: Fentanyl: 50 mcg IV  Versed: 2 mg IV  The right femoral artery was easily canulated using a modified Seldinger technique.  Hemodynamics:   LV pressure: 153/23 Aortic pressure: 150/83  Angiography   Left Main: smooth and normal   Left anterior Descending: smooth and normal. 1st and 2nd diagonals are normal  Left Circumflex: primarily provides a 1st OM branch.  There is a small branch that provides flow to the posterior lateral region.  Right Coronary Artery: smooth and normal . PDA and PLSA are normal.    LV Gram: The LV is mildly enlarged.  The mid inferior lateral  wall is moderately hypokinetic.   The overall LV function is normal.  EF 55-60%  Complications: No apparent complications Patient did tolerate procedure well.  Contrast used: 60 cc  Conclusions:  1. Smooth and normal coronaries. 2. Overall normal LV function.  There is a small area of hypokinesis in the mid-inferior lateral wall.  It is difficult to say if this is a previous MI.    We will work on her HTN.  Teresa Vasquez, [name], MD, [facility] 08/14/2012, 8:53 AM Office - [phone] Pager [phone]

## 2012-08-14 NOTE — OR Nursing (Signed)
Dr Nahser at bedside to discuss results and treatment plan with pt and family 

## 2012-08-14 NOTE — H&P (Signed)
Teresa Vasquez  07/31/2012 9:45 AM   Office Visit  MRN:  [id number]   Description: 62 year old female  Provider: Vesta Mixer, MD  Department: [facility]        Referring Provider    Lucky Cowboy, MD      Diagnoses    Chest tightness    -  Primary    [phone]      Reason for Visit    Abnormal ECG         Progress Notes    Vesta Mixer, MD at 07/31/2012  2:53 PM    Status: Signed                          Teresa Vasquez Date of Birth              1951-01-27        [facility]                                       [place] [address], [address]                   [address], [address] [place], [place]  [address]                                  [place], [place]  [address] [phone]                                                  [phone]   Fax  [phone]                                          Fax [phone]   Problem List: 1. Abnormal ECG 2.sinusitis.   3. IBS 4. Diverticulitis 5. hyperlipidemia     History of Present Illness:   Teresa Vasquez is a 62 yo with hx of chronic sinusitis.  She has come occasional chest tightness which seems to be better after she had sinus surgery.   She is active but does not get any regular exercise.  She does not have any chest pain or dyspnea doing her normal activities.   She has occasional episodes of indigestion - take OTC pepcid AC with relief.    She has occasional episodes of chest pressure.   They seem to radiate out to both arms.   These are not related to exertion or stress.  They last only a minute or so.    She had  An elevated cholesterol level. She was started on pravastatin   as well as red yeast rice.    Current Outpatient Prescriptions on File Prior to Visit   Medication  Sig  Dispense  Refill   .  ALPRAZolam (XANAX) 1 MG tablet  Take 1 mg by mouth as needed. Take 1/2 to 1 tablet by mouth as needed          .  aspirin 81 MG tablet  Take 81 mg by  mouth daily.           Marland Kitchen  guaiFENesin-codeine (ROBITUSSIN AC) 100-10 MG/5ML syrup  Take 5 mLs by mouth 3 (three) times daily as needed for cough.   120 mL   0   .  Probiotic Product (ALIGN) 4 MG CAPS  Take 4 mg by mouth daily.   1 capsule   0   .  Vitamin D, Ergocalciferol, (DRISDOL) 50000 UNITS CAPS  Take 50,000 Units by mouth every 7 (seven) days.               No current facility-administered medications on file prior to visit.        No Known Allergies    Past Medical History   Diagnosis  Date   .  Anxiety and depression     .  Diverticulitis     .  Irritable bowel syndrome     .  Depression           Past Surgical History   Procedure  Laterality  Date   .  Tonsillectomy       .  Vaginal hysterectomy       .  Colon surgery             History   Smoking status   .  Never Smoker    Smokeless tobacco   .  Never Used         History   Alcohol Use  No         Family History   Problem  Relation  Age of Onset   .  Heart disease  Father     .  Heart disease  Mother     .  Heart attack  Brother     .  Irritable bowel syndrome  Sister          Reviw of Systems:   Reviewed in the HPI.  All other systems are negative.   Physical Exam: Blood pressure 130/83, pulse 67, height 5\' 7"  (1.702 m), weight 204 lb 1.9 oz (92.588 kg). General: Well developed, well nourished, in no acute distress.   Head: Normocephalic, atraumatic, sclera non-icteric, mucus membranes are moist,    Neck: Supple. Carotids are 2 + without bruits. No JVD    Lungs: Clear    Heart: RR, normal  S1, S2.   Abdomen: Soft, non-tender, non-distended with normal bowel sounds.   Msk:  Strength and tone are normal    Extremities: No clubbing or cyanosis. No edema.  Distal pedal pulses are 2+ and equal      Neuro: CN II - XII intact.  Alert and oriented X 3.    Psych:  Normal    ECG: Jul 31, 2012:  NSR at 14.  NS T wave abnormality   Assessment / Plan:                    Teresa Vasquez is referred today for further evaluation of a mildly abnormal EKG and some mild chest tightness. She's had episodes of chest tightness for the past several months. They seem to radiate all to her arms. They're not related with exertion or eating. It lasts for several minutes.  She had a myoview that revealed ant and apical ischemia.  She is scheduled for cath.    She does not have any symptoms with exertion. She does have T-wave inversions in the anterior leads.   At this point I think that we should proceed with a Lexiscan Myoview study.  I've encouraged her to continue with the diet  and exercise program. She has a history of hyperlipidemia managed by her medical doctor.  Vesta Mixer, [name], MD, [facility] 08/14/2012, 8:28 AM Office - [phone] Pager [phone]

## 2012-08-14 NOTE — OR Nursing (Signed)
Meal served 

## 2012-08-14 NOTE — Interval H&P Note (Signed)
History and Physical Interval Note:  08/14/2012 8:29 AM  Teresa Vasquez  has presented today for surgery, with the diagnosis of CP, Abn stress test  The various methods of treatment have been discussed with the patient and family. After consideration of risks, benefits and other options for treatment, the patient has consented to  Procedure(s): JV LEFT HEART CATHETERIZATION WITH CORONARY ANGIOGRAM (N/A) as a surgical intervention .  The patient's history has been reviewed, patient examined, no change in status, stable for surgery.  I have reviewed the patient's chart and labs.  Questions were answered to the patient's satisfaction.     Elyn Aquas.

## 2012-08-19 ENCOUNTER — Telehealth: Payer: Self-pay | Admitting: Cardiovascular Disease

## 2012-08-19 NOTE — Telephone Encounter (Signed)
New problem   Pt calling to see if fax was received

## 2012-08-24 ENCOUNTER — Encounter: Payer: Self-pay | Admitting: Physician Assistant

## 2012-08-24 ENCOUNTER — Ambulatory Visit (INDEPENDENT_AMBULATORY_CARE_PROVIDER_SITE_OTHER): Payer: BC Managed Care – PPO | Admitting: Physician Assistant

## 2012-08-24 VITALS — BP 128/84 | HR 71 | Ht 67.0 in | Wt 205.8 lb

## 2012-08-24 DIAGNOSIS — R079 Chest pain, unspecified: Secondary | ICD-10-CM

## 2012-08-24 DIAGNOSIS — E785 Hyperlipidemia, unspecified: Secondary | ICD-10-CM

## 2012-08-24 MED ORDER — OMEPRAZOLE 20 MG PO CPDR: DELAYED_RELEASE_CAPSULE | ORAL | Status: DC

## 2012-08-24 NOTE — Telephone Encounter (Signed)
Pt was informed that the request for ? fmla went directly to medical records, I spoke with Kim/ MR and they will be sent to health port. Pt aware.

## 2012-08-24 NOTE — Progress Notes (Signed)
[address] [place], [place]  [address] Phone: [phone] Fax:  [phone]  Date:  08/24/2012   ID:  Teresa, Vasquez 06/16/1950, MRN [id number]  PCP:  Nadean Corwin, MD  Cardiologist:  Dr. Delane Ginger     History of Present Illness: Teresa Vasquez is a 62 y.o. female who returns for followup after recent cardiac catheterization.  She was recently evaluated by Dr. Elease Hashimoto for an abnormal EKG and chest discomfort.  Lexiscan Myoview 08/05/12: EF 51%, scar with mild ischemia in the apical, apical inferolateral segment and apical inferolateral segment; moderate risk scan. Patient was then referred for cardiac catheterization.  LHC 08/14/12:  Normal coronary arteries; EF 55-60% with inferior lateral HK.  She continues to note chest discomfort. This seems to be brought on more with emotional stress. It radiates across her shoulders and she has associated nausea and shortness of breath. She denies any exertional symptoms. She denies syncope, orthopnea, PND or edema. She has not tried nitroglycerin.  Labs (6/14):  K 4.1, creatinine 0.8, Hgb 13.2  Wt Readings from Last 3 Encounters:  08/24/12 205 lb 12.8 oz (93.35 kg)  08/14/12 205 lb (92.987 kg)  08/14/12 205 lb (92.987 kg)     Past Medical History  Diagnosis Date  . Anxiety and depression   . Diverticulitis   . Irritable bowel syndrome   . Depression     Current Outpatient Prescriptions  Medication Sig Dispense Refill  . ALPRAZolam (XANAX) 1 MG tablet Take 1 mg by mouth as needed. Take 1/2 to 1 tablet by mouth as needed       . aspirin 81 MG tablet Take 81 mg by mouth daily.        . citalopram (CELEXA) 40 MG tablet Take 40 mg by mouth daily.      . fish oil-omega-3 fatty acids 1000 MG capsule Take 2 g by mouth daily.      . magnesium 30 MG tablet Take 30 mg by mouth 2 (two) times daily.      . nitroGLYCERIN (NITROSTAT) 0.4 MG SL tablet Place 1 tablet (0.4 mg total) under the tongue every 5 (five)  minutes as needed for chest pain (call 911 if pain continues after 3 doses).  25 tablet  3  . pravastatin (PRAVACHOL) 20 MG tablet Take 20 mg by mouth daily.      . Vitamin D, Ergocalciferol, (DRISDOL) 50000 UNITS CAPS Take 50,000 Units by mouth every 7 (seven) days.         No current facility-administered medications for this visit.    Allergies:   No Known Allergies  Social History:  The patient  reports that she has never smoked. She has never used smokeless tobacco. She reports that she does not drink alcohol or use illicit drugs.   ROS:  Please see the history of present illness.   She does note increased belching as well as mild dysphagia. She denies melena or hematochezia.   All other systems reviewed and negative.   PHYSICAL EXAM: VS:  BP 128/84  Pulse 71  Ht 5\' 7"  (1.702 m)  Wt 205 lb 12.8 oz (93.35 kg)  BMI 32.23 kg/m2 Well nourished, well developed, in no acute distress HEENT: normal Neck: no JVD Cardiac:  normal S1, S2; RRR; no murmur Lungs:  clear to auscultation bilaterally, no wheezing, rhonchi or rales Abd: soft, nontender, no hepatomegaly Ext: no edema; right groin without hematoma or bruit  Skin: warm and dry Neuro:  CNs 2-12 intact, no focal abnormalities noted  EKG:  NSR, HR 71, inferolateral T wave inversions, no change from prior tracing     ASSESSMENT AND PLAN:  1. Chest Pain:  Etiology not entirely clear. She does have and inferior wall motion abnormality. This may represent a history of spasm.  Spasm was not mentioned at the time of her cardiac catheterization. She also has symptoms that sound consistent with acid reflux. She also notes significant emotional stress from her job. She actually plans to retire soon. She has paperwork currently submitted to keep her out of work for the next 3 months while she use of the rest of her vacation time. She did not have symptoms at home on the weekends when she is away from work which would lead one to think that her  symptoms are mainly related to anxiety and stress. I have recommended that she try Prilosec 20 mg daily for the next 2-3 weeks and then as needed. She can discuss her symptoms further with her PCP later this week. She may want to pursue counseling or other medical therapy for anxiety and stress. I have encouraged her to use nitroglycerin as needed for chest discomfort. If she has significant improvement in her symptoms with this, we may want to consider long-acting nitrates to control symptoms (vasospasm). 2. Hyperlipidemia: Continue statin. 3. Disposition: Followup with Dr. Elease Hashimoto in 6-8 weeks.  Signed, [name], PA-C  08/24/2012 10:11 AM

## 2012-08-24 NOTE — Patient Instructions (Addendum)
START PRILOSEC 20 MG CAP; TAKE 1 CAP DAILY FOR 3-4 WEEKS AND THEN AS NEEDED ONLY;   PLEASE FOLLOW UP WITH DR. Elease Hashimoto 11/13/12 @ 1:30 PM

## 2012-08-25 NOTE — Telephone Encounter (Signed)
PER SCOTT WEAVER:  Will you call Teresa Vasquez and let her know that we would prefer that her PCP do this. I give patients 1 week off after cath . I will not be able to write her out for 2 1/2 months. Thanks  Pt informed of above.

## 2012-08-25 NOTE — Telephone Encounter (Signed)
Pt called regarding the FMLA papers. Pt states that she is scheduled to go to  PCP  that referred her  to the cardiologist, and will see if MD can fill the papers she needs. Pt  said she needs the form for the Department of labor filled out, not the disability papers that she first wanted to have done.Pt will call Kim back if the PCP agreed to fill those forms.

## 2012-08-25 NOTE — Telephone Encounter (Signed)
F/u   Pt calling to see if paperwork for dept of labor form is ready-per pt someone was going to put a rush on her request

## 2012-08-26 NOTE — Telephone Encounter (Signed)
lmtcb

## 2012-08-26 NOTE — Telephone Encounter (Signed)
Pt will have pcp do physical and will fill out the paperwork

## 2012-08-27 ENCOUNTER — Telehealth: Payer: Self-pay | Admitting: *Deleted

## 2012-08-27 NOTE — Telephone Encounter (Signed)
lmptcb to advise that per Dr. Elease Hashimoto and [name], [facility] to have PCP fill out FMLA paper work

## 2012-08-28 NOTE — Telephone Encounter (Signed)
lmom x3 per Dr. Elease Hashimoto to have PCP fill out FMLA  paperwork.

## 2012-08-28 NOTE — Telephone Encounter (Signed)
called the pt second time, however could not lvmom this time. I will try again later to reach pt. 

## 2012-08-28 NOTE — Telephone Encounter (Signed)
called the pt second time, however could not lvmom this time. I will try again later to reach pt.

## 2012-09-09 ENCOUNTER — Ambulatory Visit: Payer: BC Managed Care – PPO | Admitting: Cardiovascular Disease

## 2012-11-10 ENCOUNTER — Ambulatory Visit
Admission: RE | Admit: 2012-11-10 | Discharge: 2012-11-10 | Disposition: A | Discharge status: Home / Self Care | Payer: BC Managed Care – PPO | Source: Ambulatory Visit | Attending: Internal Medicine | Admitting: Internal Medicine

## 2012-11-10 ENCOUNTER — Other Ambulatory Visit: Payer: Self-pay | Admitting: Internal Medicine

## 2012-11-10 DIAGNOSIS — R1032 Left lower quadrant pain: Secondary | ICD-10-CM

## 2012-11-10 DIAGNOSIS — R1031 Right lower quadrant pain: Secondary | ICD-10-CM

## 2012-11-10 MED ORDER — IOHEXOL 300 MG/ML  SOLN
125.0000 mL | Freq: Once | INTRAMUSCULAR | Status: AC | PRN
  Administered 2012-11-10: 125 mL via INTRAVENOUS

## 2012-11-13 ENCOUNTER — Ambulatory Visit: Payer: BC Managed Care – PPO | Admitting: Cardiovascular Disease

## 2013-02-15 ENCOUNTER — Encounter: Payer: Self-pay | Admitting: Internal Medicine

## 2013-02-15 ENCOUNTER — Telehealth (INDEPENDENT_AMBULATORY_CARE_PROVIDER_SITE_OTHER): Payer: Self-pay | Admitting: General Surgery

## 2013-02-15 ENCOUNTER — Ambulatory Visit (INDEPENDENT_AMBULATORY_CARE_PROVIDER_SITE_OTHER): Payer: BC Managed Care – PPO | Admitting: Internal Medicine

## 2013-02-15 VITALS — BP 126/82 | HR 76 | Temp 97.7°F | Resp 18 | Wt 205.0 lb

## 2013-02-15 DIAGNOSIS — R109 Unspecified abdominal pain: Secondary | ICD-10-CM

## 2013-02-15 DIAGNOSIS — Z79899 Other long term (current) drug therapy: Secondary | ICD-10-CM

## 2013-02-15 LAB — CBC WITH DIFFERENTIAL/PLATELET
Basophils Absolute: 0 10*3/uL (ref 0.0–0.1)
Basophils Relative: 1 % (ref 0–1)
Eosinophils Absolute: 0.1 10*3/uL (ref 0.0–0.7)
Eosinophils Relative: 2 % (ref 0–5)
Lymphs Abs: 1.3 10*3/uL (ref 0.7–4.0)
MCH: 31 pg (ref 26.0–34.0)
MCHC: 33.8 g/dL (ref 30.0–36.0)
MCV: 91.9 fL (ref 78.0–100.0)
Neutrophils Relative %: 60 % (ref 43–77)
Platelets: 214 10*3/uL (ref 150–400)
RBC: 4.67 MIL/uL (ref 3.87–5.11)
RDW: 13.5 % (ref 11.5–15.5)

## 2013-02-15 MED ORDER — DICYCLOMINE HCL 20 MG PO TABS: 20.0000 mg | ORAL_TABLET | Freq: Three times a day (TID) | ORAL | Status: DC | PRN

## 2013-02-15 NOTE — Progress Notes (Signed)
   Subjective:    Patient ID: Teresa Vasquez, female    DOB: [birth year], 62 y.o.   MRN: [id number]  Abdominal Pain This is a chronic problem. The current episode started more than 1 month ago. The onset quality is sudden. The problem occurs 2 to 4 times per day. The problem has been gradually worsening. The pain is located in the LLQ, RLQ and suprapubic region. The pain is mild. The quality of the pain is colicky and cramping. The abdominal pain does not radiate. Associated symptoms include diarrhea, flatus and nausea. Pertinent negatives include no belching, constipation, dysuria, frequency, headaches, hematochezia, hematuria, melena, myalgias, vomiting or weight loss.      Review of Systems  Constitutional: Positive for fatigue. Negative for weight loss, activity change and appetite change.  HENT: Negative.   Respiratory: Negative.   Cardiovascular: Negative.   Gastrointestinal: Positive for nausea, abdominal pain, diarrhea, abdominal distention and flatus. Negative for vomiting, constipation, blood in stool, melena, hematochezia, anal bleeding and rectal pain.       Describes post prandial poorly formed BM's  Genitourinary: Negative for dysuria, frequency and hematuria.  Musculoskeletal: Negative.  Negative for myalgias.  Skin: Negative.   Neurological: Negative for headaches.            ALPRAZolam 1 MG tablet  Commonly known as:  XANAX  Take 1 mg by mouth as needed. Take 1/2 to 1 tablet by mouth as needed     aspirin 81 MG tablet  Take 81 mg by mouth daily.        omeprazole 20 MG capsule  Commonly known as:  PRILOSEC  TAKE 1 CAP DAILY FOR 1 MONTH THEN AS NEEDED ONLY     Vitamin D (Ergocalciferol) 50000 UNITS Caps capsule  Commonly known as:  DRISDOL  Take 50,000 Units by mouth every 7 (seven) days.          Objective:   Physical Exam  Constitutional: She appears well-nourished.  HENT:  Right Ear: External ear normal.  Left Ear: External ear normal.   Mouth/Throat: No oropharyngeal exudate.  Eyes: EOM are normal. Pupils are equal, round, and reactive to light.  Neck: Normal range of motion. Neck supple. No thyromegaly present.  Cardiovascular: Normal rate, regular rhythm and normal heart sounds.   No murmur heard. Pulmonary/Chest: Effort normal and breath sounds normal. No respiratory distress. She has no wheezes. She has no rales. She exhibits no tenderness.  Abdominal: Soft. Bowel sounds are normal. She exhibits no distension. There is tenderness. There is no rebound and no guarding.  Lymphadenopathy:    She has no cervical adenopathy.  Skin: Skin is dry.          Assessment & Plan:  1. Abdominal pain, unspecified site  - CBC with Differential - dicyclomine (BENTYL) 20 MG tablet; Take 1 tablet (20 mg total) by mouth 3 (three) times daily as needed for spasms.  Dispense: 90 tablet; Refill: 0 Recommend try G. Immodium/Loperamide  1-2  tid ac   2. Encounter for long-term (current) use of other medications  - BASIC METABOLIC PANEL WITH GFR - Hepatic function panel - TSH

## 2013-02-15 NOTE — Patient Instructions (Signed)
Buy Loperamide 2 mg (generic Immodium)   And try 1 or 2 tablets 3 x day before meals for diarrhea   Irritable Bowel Syndrome Irritable Bowel Syndrome (IBS) is caused by a disturbance of normal bowel function. Other terms used are spastic colon, mucous colitis, and irritable colon. It does not require surgery, nor does it lead to cancer. There is no cure for IBS. But with proper diet, stress reduction, and medication, you will find that your problems (symptoms) will gradually disappear or improve. IBS is a common digestive disorder. It usually appears in late adolescence or early adulthood. Women develop it twice as often as men. CAUSES  After food has been digested and absorbed in the small intestine, waste material is moved into the colon (large intestine). In the colon, water and salts are absorbed from the undigested products coming from the small intestine. The remaining residue, or fecal material, is held for elimination. Under normal circumstances, gentle, rhythmic contractions on the bowel walls push the fecal material along the colon towards the rectum. In IBS, however, these contractions are irregular and poorly coordinated. The fecal material is either retained too long, resulting in constipation, or expelled too soon, producing diarrhea. SYMPTOMS  The most common symptom of IBS is pain. It is typically in the lower left side of the belly (abdomen). But it may occur anywhere in the abdomen. It can be felt as heartburn, backache, or even as a dull pain in the arms or shoulders. The pain comes from excessive bowel-muscle spasms and from the buildup of gas and fecal material in the colon. This pain:  Can range from sharp belly (abdominal) cramps to a dull, continuous ache.  Usually worsens soon after eating.  Is typically relieved by having a bowel movement or passing gas. Abdominal pain is usually accompanied by constipation. But it may also produce diarrhea. The diarrhea typically occurs  right after a meal or upon arising in the morning. The stools are typically soft and watery. They are often flecked with secretions (mucus). Other symptoms of IBS include:  Bloating.  Loss of appetite.  Heartburn.  Feeling sick to your stomach (nausea).  Belching  Vomiting  Gas. IBS may also cause a number of symptoms that are unrelated to the digestive system:  Fatigue.  Headaches.  Anxiety  Shortness of breath  Difficulty in concentrating.  Dizziness. These symptoms tend to come and go. DIAGNOSIS  The symptoms of IBS closely mimic the symptoms of other, more serious digestive disorders. So your caregiver may wish to perform a variety of additional tests to exclude these disorders. He/she wants to be certain of learning what is wrong (diagnosis). The nature and purpose of each test will be explained to you. TREATMENT A number of medications are available to help correct bowel function and/or relieve bowel spasms and abdominal pain. Among the drugs available are:  Mild, non-irritating laxatives for severe constipation and to help restore normal bowel habits.  Specific anti-diarrheal medications to treat severe or prolonged diarrhea.  Anti-spasmodic agents to relieve intestinal cramps.  Your caregiver may also decide to treat you with a mild tranquilizer or sedative during unusually stressful periods in your life. The important thing to remember is that if any drug is prescribed for you, make sure that you take it exactly as directed. Make sure that your caregiver knows how well it worked for you. HOME CARE INSTRUCTIONS   Avoid foods that are high in fat or oils. Some examples ZOX:[id number] cream, butter, frankfurters, sausage,  and other fatty meats.  Avoid foods that have a laxative effect, such as fruit, fruit juice, and dairy products.  Cut out carbonated drinks, chewing gum, and "gassy" foods, such as beans and cabbage. This may help relieve bloating and  belching.  Bran taken with plenty of liquids may help relieve constipation.  Keep track of what foods seem to trigger your symptoms.  Avoid emotionally charged situations or circumstances that produce anxiety.  Start or continue exercising.  Get plenty of rest and sleep. MAKE SURE YOU:   Understand these instructions.  Will watch your condition.  Will get help right away if you are not doing well or get worse. Document Released: 02/18/2005 Document Revised: 05/13/2011 Document Reviewed: 10/09/2007 [facility] Patient Information 2014 [place], [place].

## 2013-02-15 NOTE — Telephone Encounter (Signed)
Patient calling status post surgery for diverticulitis by Dr Johna Sheriff in 2012. States over the last few months she is having issues with reflux, gas, bloating and diarrhea. She is taking pepcid 2-3 times a day. She is having diarrhea every time she eats and having pains on the right and left sides of her abdomen. I advised patient to start with a gastroenterologist for evaluation/work-up and they will send her to [place] if needed. She expressed appreciation.

## 2013-02-16 LAB — TSH: TSH: 3.357 u[IU]/mL (ref 0.350–4.500)

## 2013-02-16 LAB — BASIC METABOLIC PANEL WITH GFR
CO2: 31 mEq/L (ref 19–32)
Calcium: 10.6 mg/dL — ABNORMAL HIGH (ref 8.4–10.5)
Creat: 0.86 mg/dL (ref 0.50–1.10)
GFR, Est African American: 85 mL/min
GFR, Est Non African American: 74 mL/min
Sodium: 141 mEq/L (ref 135–145)

## 2013-02-16 LAB — HEPATIC FUNCTION PANEL
AST: 18 U/L (ref 0–37)
Albumin: 4.6 g/dL (ref 3.5–5.2)
Bilirubin, Direct: 0.1 mg/dL (ref 0.0–0.3)
Total Bilirubin: 0.5 mg/dL (ref 0.3–1.2)

## 2013-03-11 ENCOUNTER — Encounter: Payer: Self-pay | Admitting: Internal Medicine

## 2013-03-14 DIAGNOSIS — E782 Mixed hyperlipidemia: Secondary | ICD-10-CM | POA: Insufficient documentation

## 2013-03-14 DIAGNOSIS — J301 Allergic rhinitis due to pollen: Secondary | ICD-10-CM | POA: Insufficient documentation

## 2013-03-14 DIAGNOSIS — R7309 Other abnormal glucose: Secondary | ICD-10-CM | POA: Insufficient documentation

## 2013-03-14 DIAGNOSIS — R030 Elevated blood-pressure reading, without diagnosis of hypertension: Secondary | ICD-10-CM | POA: Insufficient documentation

## 2013-03-14 DIAGNOSIS — E559 Vitamin D deficiency, unspecified: Secondary | ICD-10-CM | POA: Insufficient documentation

## 2013-03-14 NOTE — Progress Notes (Signed)
Patient ID: Teresa Vasquez, female   DOB: Oct 14, 1950, 63 y.o.   MRN: [id number]   This very nice 63 y.o. MWF presents for 3 month follow up with Hx/o Elevated BP, Hyperlipidemia, Pre-Diabetes and Vitamin D Deficiency.    Patient has Hx/o Elevated BP being monitored expectantly. Today's BP: 116/76 mmHg . Patient had a false mildly positive Cardiolyte exonerated by a normal heart hath in June 2014. Patient denies any cardiac type chest pain, palpitations, dyspnea/orthopnea/PND, dizziness, claudication, or dependent edema.   Hyperlipidemia is controlled with diet with Hx/o statin intolerance. Last Cholesterol was 194, Triglycerides were 211, HDL 39  and LDL  113 in June 2014. Patient denies myalgias or other med SE's.    Also, Be cause of her Obesity (BMI 33)  she is being monitored for PreDiabetes & insulin resistance with last A1c of  5.3 % in June. Patient denies any symptoms of reactive hypoglycemia, diabetic polys, paresthesias or visual blurring.   Other concern are recurrent LLQ abd pains felt primarily due to Irritable Bowel Syndrome. Also, she has GERD not controlled with diet and Omeprazole 20 mg.   Further, Patient has history of Vitamin D Deficiency of 16 in 2008 with last vitamin D of 549 in June 2014. Patient supplements vitamin D without any suspected side-effects.    Medication List       This list is accurate as of: 03/14/13  8:06 PM.  Always use your most recent med list.               ALPRAZolam 1 MG tablet  Commonly known as:  XANAX  Take 1 mg by mouth as needed. Take 1/2 to 1 tablet by mouth as needed     aspirin 81 MG tablet  Take 81 mg by mouth daily.     dicyclomine 20 MG tablet  Commonly known as:  BENTYL  Take 1 tablet (20 mg total) by mouth 3 (three) times daily as needed for spasms.     omeprazole 40 MG capsule  Commonly known as:  PRILOSEC  TAKE 1 CAP DAILY FOR 1 MONTH THEN AS NEEDED ONLY     Vitamin D (Ergocalciferol) 50000 UNITS Caps capsule   Commonly known as:  DRISDOL  Take 50,000 Units by mouth every 7 (seven) days.         Allergies  Allergen Reactions  . Atorvastatin   . Levaquin [Levofloxacin In D5w] Nausea Only    PMHx:   Past Medical History  Diagnosis Date  . Anxiety and depression   . Diverticulitis   . Irritable bowel syndrome   . Depression   . Hypertension   . Hyperlipidemia     FHx:    Reviewed / unchanged  SHx:    Reviewed / unchanged  Systems Review: Constitutional: Denies fever, chills, wt changes, headaches, insomnia, fatigue, night sweats, change in appetite. Eyes: Denies redness, blurred vision, diplopia, discharge, itchy, watery eyes.  ENT: Denies discharge, congestion, post nasal drip, epistaxis, sore throat, earache, hearing loss, dental pain, tinnitus, vertigo, sinus pain, snoring.  CV: Denies chest pain, palpitations, irregular heartbeat, syncope, dyspnea, diaphoresis, orthopnea, PND, claudication, edema. Respiratory: denies cough, dyspnea, DOE, pleurisy, hoarseness, laryngitis, wheezing.  Gastrointestinal: Denies dysphagia, odynophagia, heartburn, reflux, water brash, abdominal pain or cramps, nausea, vomiting, bloating, diarrhea, constipation, hematemesis, melena, hematochezia,  or hemorrhoids. Genitourinary: Denies dysuria, frequency, urgency, nocturia, hesitancy, discharge, hematuria, flank pain. Musculoskeletal: Denies arthralgias, myalgias, stiffness, jt. swelling, pain, limp, strain/sprain.  Skin: Denies pruritus, rash, hives, warts, acne,  eczema, change in skin lesion(s). Neuro: No weakness, tremor, incoordination, spasms, paresthesia, or pain. Psychiatric: Denies confusion, memory loss, or sensory loss. Endo: Denies change in weight, skin, hair change.  Heme/Lymph: No excessive bleeding, bruising, orenlarged lymph nodes.  Filed Vitals:   03/15/13 1611  BP: 116/76  Pulse: 76  Temp: 97.7 F (36.5 C)  Resp: 18    Estimated body mass index is 32.19 kg/(m^2) as calculated  from the following:   Height as of 08/24/12: 5\' 7"  (1.702 m).   Weight as of this encounter: 205 lb 9.6 oz (93.26 kg).  On Exam: Appears well nourished - in no distress. Eyes: PERRLA, EOMs, conjunctiva no swelling or erythema. Sinuses: No frontal/maxillary tenderness ENT/Mouth: EAC's clear, TM's nl w/o erythema, bulging. Nares clear w/o erythema, swelling, exudates. Oropharynx clear without erythema or exudates. Oral hygiene is good. Tongue normal, non obstructing. Hearing intact.  Neck: Supple. Thyroid nl. Car 2+/2+ without bruits, nodes or JVD. Chest: Respirations nl with BS clear & equal w/o rales, rhonchi, wheezing or stridor.  Cor: Heart sounds normal w/ regular rate and rhythm without sig. murmurs, gallops, clicks, or rubs. Peripheral pulses normal and equal  without edema.  Abdomen: Soft & bowel sounds normal. Non-tender w/o guarding, rebound, hernias, masses, or organomegaly.  Lymphatics: Unremarkable.  Musculoskeletal: Full ROM all peripheral extremities, joint stability, 5/5 strength, and normal gait.  Skin: Warm, dry without exposed rashes, lesions, ecchymosis apparent.  Neuro: Cranial nerves intact, reflexes equal bilaterally. Sensory-motor testing grossly intact. Tendon reflexes grossly intact.  Pysch: Alert & oriented x 3. Insight and judgement nl & appropriate. No ideations.  Assessment and Plan:  1. Hypertension - Continue monitor blood pressure at home. Continue diet/meds same.  2. Hyperlipidemia - Continue diet/meds, exercise,& lifestyle modifications. Continue monitor periodic cholesterol/liver & renal functions   3. Pre-diabetes/Insulin Resistance/ monitoring for  - Continue diet, exercise, lifestyle modifications. Monitor appropriate labs.  4. Vitamin D Deficiency - Continue supplementation.  5. IBS - continue diet and Bentyl and stress reduction  6. GERD - increase Omeprazole to 40 mg  Recommended regular exercise, BP monitoring, weight control, and discussed med  and SE's. Recommended labs to assess and monitor clinical status. Further disposition pending results of labs.

## 2013-03-14 NOTE — Patient Instructions (Addendum)
Gastroesophageal Reflux Disease, Adult Gastroesophageal reflux disease (GERD) happens when acid from your stomach flows up into the esophagus. When acid comes in contact with the esophagus, the acid causes soreness (inflammation) in the esophagus. Over time, GERD may create small holes (ulcers) in the lining of the esophagus. CAUSES   Increased body weight. This puts pressure on the stomach, making acid rise from the stomach into the esophagus.  Smoking. This increases acid production in the stomach.  Drinking alcohol. This causes decreased pressure in the lower esophageal sphincter (valve or ring of muscle between the esophagus and stomach), allowing acid from the stomach into the esophagus.  Late evening meals and a full stomach. This increases pressure and acid production in the stomach.  A malformed lower esophageal sphincter. Sometimes, no cause is found. SYMPTOMS   Burning pain in the lower part of the mid-chest behind the breastbone and in the mid-stomach area. This may occur twice a week or more often.  Trouble swallowing.  Sore throat.  Dry cough.  Asthma-like symptoms including chest tightness, shortness of breath, or wheezing. DIAGNOSIS  Your caregiver may be able to diagnose GERD based on your symptoms. In some cases, X-rays and other tests may be done to check for complications or to check the condition of your stomach and esophagus. TREATMENT  Your caregiver may recommend over-the-counter or prescription medicines to help decrease acid production. Ask your caregiver before starting or adding any new medicines.  HOME CARE INSTRUCTIONS   Change the factors that you can control. Ask your caregiver for guidance concerning weight loss, quitting smoking, and alcohol consumption.  Avoid foods and drinks that make your symptoms worse, such as:  Caffeine or alcoholic drinks.  Chocolate.  Peppermint or mint flavorings.  Garlic and onions.  Spicy foods.  Citrus fruits,  such as oranges, lemons, or limes.  Tomato-based foods such as sauce, chili, salsa, and pizza.  Fried and fatty foods.  Avoid lying down for the 3 hours prior to your bedtime or prior to taking a nap.  Eat small, frequent meals instead of large meals.  Wear loose-fitting clothing. Do not wear anything tight around your waist that causes pressure on your stomach.  Raise the head of your bed 6 to 8 inches with wood blocks to help you sleep. Extra pillows will not help.  Only take over-the-counter or prescription medicines for pain, discomfort, or fever as directed by your caregiver.  Do not take aspirin, ibuprofen, or other nonsteroidal anti-inflammatory drugs (NSAIDs). SEEK IMMEDIATE MEDICAL CARE IF:   You have pain in your arms, neck, jaw, teeth, or back.  Your pain increases or changes in intensity or duration.  You develop nausea, vomiting, or sweating (diaphoresis).  You develop shortness of breath, or you faint.  Your vomit is green, yellow, black, or looks like coffee grounds or blood.  Your stool is red, bloody, or black. These symptoms could be signs of other problems, such as heart disease, gastric bleeding, or esophageal bleeding. MAKE SURE YOU:   Understand these instructions.  Will watch your condition.  Will get help right away if you are not doing well or get worse. Document Released: 11/28/2004 Document Revised: 05/13/2011 Document Reviewed: 09/07/2010 [facility] Patient Information 2014 [place], [place].   Irritable Bowel Syndrome Irritable Bowel Syndrome (IBS) is caused by a disturbance of normal bowel function. Other terms used are spastic colon, mucous colitis, and irritable colon. It does not require surgery, nor does it lead to cancer. There is no cure for  IBS. But with proper diet, stress reduction, and medication, you will find that your problems (symptoms) will gradually disappear or improve. IBS is a common digestive disorder. It usually appears in late  adolescence or early adulthood. Women develop it twice as often as men. CAUSES  After food has been digested and absorbed in the small intestine, waste material is moved into the colon (large intestine). In the colon, water and salts are absorbed from the undigested products coming from the small intestine. The remaining residue, or fecal material, is held for elimination. Under normal circumstances, gentle, rhythmic contractions on the bowel walls push the fecal material along the colon towards the rectum. In IBS, however, these contractions are irregular and poorly coordinated. The fecal material is either retained too long, resulting in constipation, or expelled too soon, producing diarrhea. SYMPTOMS  The most common symptom of IBS is pain. It is typically in the lower left side of the belly (abdomen). But it may occur anywhere in the abdomen. It can be felt as heartburn, backache, or even as a dull pain in the arms or shoulders. The pain comes from excessive bowel-muscle spasms and from the buildup of gas and fecal material in the colon. This pain:  Can range from sharp belly (abdominal) cramps to a dull, continuous ache.  Usually worsens soon after eating.  Is typically relieved by having a bowel movement or passing gas. Abdominal pain is usually accompanied by constipation. But it may also produce diarrhea. The diarrhea typically occurs right after a meal or upon arising in the morning. The stools are typically soft and watery. They are often flecked with secretions (mucus). Other symptoms of IBS include:  Bloating.  Loss of appetite.  Heartburn.  Feeling sick to your stomach (nausea).  Belching  Vomiting  Gas. IBS may also cause a number of symptoms that are unrelated to the digestive system:  Fatigue.  Headaches.  Anxiety  Shortness of breath  Difficulty in concentrating.  Dizziness. These symptoms tend to come and go. DIAGNOSIS  The symptoms of IBS closely mimic the  symptoms of other, more serious digestive disorders. So your caregiver may wish to perform a variety of additional tests to exclude these disorders. He/she wants to be certain of learning what is wrong (diagnosis). The nature and purpose of each test will be explained to you. TREATMENT A number of medications are available to help correct bowel function and/or relieve bowel spasms and abdominal pain. Among the drugs available are:  Mild, non-irritating laxatives for severe constipation and to help restore normal bowel habits.  Specific anti-diarrheal medications to treat severe or prolonged diarrhea.  Anti-spasmodic agents to relieve intestinal cramps.  Your caregiver may also decide to treat you with a mild tranquilizer or sedative during unusually stressful periods in your life. The important thing to remember is that if any drug is prescribed for you, make sure that you take it exactly as directed. Make sure that your caregiver knows how well it worked for you. HOME CARE INSTRUCTIONS   Avoid foods that are high in fat or oils. Some examples [id number] cream, butter, frankfurters, sausage, and other fatty meats.  Avoid foods that have a laxative effect, such as fruit, fruit juice, and dairy products.  Cut out carbonated drinks, chewing gum, and "gassy" foods, such as beans and cabbage. This may help relieve bloating and belching.  Bran taken with plenty of liquids may help relieve constipation.  Keep track of what foods seem to trigger your symptoms.  Avoid emotionally charged situations or circumstances that produce anxiety.  Start or continue exercising.  Get plenty of rest and sleep. MAKE SURE YOU:   Understand these instructions.  Will watch your condition.  Will get help right away if you are not doing well or get worse. Document Released: 02/18/2005 Document Revised: 05/13/2011 Document Reviewed: 10/09/2007 [facility] Patient Information 2014 [place].  Diet and  Irritable Bowel Syndrome  No cure has been found for irritable bowel syndrome (IBS). Many options are available to treat the symptoms. Your caregiver will give you the best treatments available for your symptoms. He or she will also encourage you to manage stress and to make changes to your diet. You need to work with your caregiver and Registered Dietician to find the best combination of medicine, diet, counseling, and support to control your symptoms. The following are some diet suggestions. FOODS THAT MAKE IBS WORSE  Fatty foods, such as [place] fries.  Milk products, such as cheese or ice cream.  Chocolate.  Alcohol.  Caffeine (found in coffee and some sodas).  Carbonated drinks, such as soda. If certain foods cause symptoms, you should eat less of them or stop eating them. FOOD JOURNAL   Keep a journal of the foods that seem to cause distress. Write down:  What you are eating during the day and when.  What problems you are having after eating.  When the symptoms occur in relation to your meals.  What foods always make you feel badly.  Take your notes with you to your caregiver to see if you should stop eating certain foods. FOODS THAT MAKE IBS BETTER Fiber reduces IBS symptoms, especially constipation, because it makes stools soft, bulky, and easier to pass. Fiber is found in bran, bread, cereal, beans, fruit, and vegetables. Examples of foods with fiber include:  Apples.  Peaches.  Pears.  Berries.  Figs.  Broccoli, raw.  Cabbage.  Carrots.  Raw peas.  Kidney beans.  Lima beans.  Whole-grain bread.  Whole-grain cereal. Add foods with fiber to your diet a little at a time. This will let your body get used to them. Too much fiber at once might cause gas and swelling of your abdomen. This can trigger symptoms in a person with IBS. Caregivers usually recommend a diet with enough fiber to produce soft, painless bowel movements. High fiber diets may cause gas  and bloating. However, these symptoms often go away within a few weeks, as your body adjusts. In many cases, dietary fiber may lessen IBS symptoms, particularly constipation. However, it may not help pain or diarrhea. High fiber diets keep the colon mildly enlarged (distended) with the added fiber. This may help prevent spasms in the colon. Some forms of fiber also keep water in the stool, thereby preventing hard stools that are difficult to pass.  Besides telling you to eat more foods with fiber, your caregiver may also tell you to get more fiber by taking a fiber pill or drinking water mixed with a special high fiber powder. An example of this is a natural fiber laxative containing psyllium seed.  TIPS  Large meals can cause cramping and diarrhea in people with IBS. If this happens to you, try eating 4 or 5 small meals a day, or try eating less at each of your usual 3 meals. It may also help if your meals are low in fat and high in carbohydrates. Examples of carbohydrates are pasta, rice, whole-grain breads and cereals, fruits, and vegetables.  If  dairy products cause your symptoms to flare up, you can try eating less of those foods. You might be able to handle yogurt better than other dairy products, because it contains bacteria that helps with digestion. Dairy products are an important source of calcium and other nutrients. If you need to avoid dairy products, be sure to talk with a Registered Dietitian about getting these nutrients through other food sources.  Drink enough water and fluids to keep your urine clear or pale yellow. This is important, especially if you have diarrhea. FOR MORE INFORMATION  International Foundation for Functional Gastrointestinal Disorders: www.iffgd.org  National Digestive Diseases Information Clearinghouse: digestive.AmenCredit.is Document Released: 05/11/2003 Document Revised: 05/13/2011 Document Reviewed: 01/26/2007 [facility] Patient Information 2014 [place],  [place].   Hypertension As your heart beats, it forces blood through your arteries. This force is your blood pressure. If the pressure is too high, it is called hypertension (HTN) or high blood pressure. HTN is dangerous because you may have it and not know it. High blood pressure may mean that your heart has to work harder to pump blood. Your arteries may be narrow or stiff. The extra work puts you at risk for heart disease, stroke, and other problems.  Blood pressure consists of two numbers, a higher number over a lower, 110/72, for example. It is stated as "110 over 72." The ideal is below 120 for the top number (systolic) and under 80 for the bottom (diastolic). Write down your blood pressure today. You should pay close attention to your blood pressure if you have certain conditions such as:  Heart failure.  Prior heart attack.  Diabetes  Chronic kidney disease.  Prior stroke.  Multiple risk factors for heart disease. To see if you have HTN, your blood pressure should be measured while you are seated with your arm held at the level of the heart. It should be measured at least twice. A one-time elevated blood pressure reading (especially in the Emergency Department) does not mean that you need treatment. There may be conditions in which the blood pressure is different between your right and left arms. It is important to see your caregiver soon for a recheck. Most people have essential hypertension which means that there is not a specific cause. This type of high blood pressure may be lowered by changing lifestyle factors such as:  Stress.  Smoking.  Lack of exercise.  Excessive weight.  Drug/tobacco/alcohol use.  Eating less salt. Most people do not have symptoms from high blood pressure until it has caused damage to the body. Effective treatment can often prevent, delay or reduce that damage. TREATMENT  When a cause has been identified, treatment for high blood pressure is directed  at the cause. There are a large number of medications to treat HTN. These fall into several categories, and your caregiver will help you select the medicines that are best for you. Medications may have side effects. You should review side effects with your caregiver. If your blood pressure stays high after you have made lifestyle changes or started on medicines,   Your medication(s) may need to be changed.  Other problems may need to be addressed.  Be certain you understand your prescriptions, and know how and when to take your medicine.  Be sure to follow up with your caregiver within the time frame advised (usually within two weeks) to have your blood pressure rechecked and to review your medications.  If you are taking more than one medicine to lower your blood pressure,  make sure you know how and at what times they should be taken. Taking two medicines at the same time can result in blood pressure that is too low. SEEK IMMEDIATE MEDICAL CARE IF:  You develop a severe headache, blurred or changing vision, or confusion.  You have unusual weakness or numbness, or a faint feeling.  You have severe chest or abdominal pain, vomiting, or breathing problems. MAKE SURE YOU:   Understand these instructions.  Will watch your condition.  Will get help right away if you are not doing well or get worse. Document Released: 02/18/2005 Document Revised: 05/13/2011 Document Reviewed: 10/09/2007 [facility] Patient Information 2014 [place].  Diabetes and Exercise Exercising regularly is important. It is not just about losing weight. It has many health benefits, such as:  Improving your overall fitness, flexibility, and endurance.  Increasing your bone density.  Helping with weight control.  Decreasing your body fat.  Increasing your muscle strength.  Reducing stress and tension.  Improving your overall health. People with diabetes who exercise gain additional benefits because  exercise:  Reduces appetite.  Improves the body's use of blood sugar (glucose).  Helps lower or control blood glucose.  Decreases blood pressure.  Helps control blood lipids (such as cholesterol and triglycerides).  Improves the body's use of the hormone insulin by:  Increasing the body's insulin sensitivity.  Reducing the body's insulin needs.  Decreases the risk for heart disease because exercising:  Lowers cholesterol and triglycerides levels.  Increases the levels of good cholesterol (such as high-density lipoproteins [HDL]) in the body.  Lowers blood glucose levels. YOUR ACTIVITY PLAN  Choose an activity that you enjoy and set realistic goals. Your health care provider or diabetes educator can help you make an activity plan that works for you. You can break activities into 2 or 3 sessions throughout the day. Doing so is as good as one long session. Exercise ideas include:  Taking the dog for a walk.  Taking the stairs instead of the elevator.  Dancing to your favorite song.  Doing your favorite exercise with a friend. RECOMMENDATIONS FOR EXERCISING WITH TYPE 1 OR TYPE 2 DIABETES   Check your blood glucose before exercising. If blood glucose levels are greater than 240 mg/dL, check for urine ketones. Do not exercise if ketones are present.  Avoid injecting insulin into areas of the body that are going to be exercised. For example, avoid injecting insulin into:  The arms when playing tennis.  The legs when jogging.  Keep a record of:  Food intake before and after you exercise.  Expected peak times of insulin action.  Blood glucose levels before and after you exercise.  The type and amount of exercise you have done.  Review your records with your health care provider. Your health care provider will help you to develop guidelines for adjusting food intake and insulin amounts before and after exercising.  If you take insulin or oral hypoglycemic agents, watch  for signs and symptoms of hypoglycemia. They include:  Dizziness.  Shaking.  Sweating.  Chills.  Confusion.  Drink plenty of water while you exercise to prevent dehydration or heat stroke. Body water is lost during exercise and must be replaced.  Talk to your health care provider before starting an exercise program to make sure it is safe for you. Remember, almost any type of activity is better than none. Document Released: 05/11/2003 Document Revised: 10/21/2012 Document Reviewed: 07/28/2012 [facility] Patient Information 2014 [place].  Cholesterol Cholesterol is a  white, waxy, fat-like protein needed by your body in small amounts. The liver makes all the cholesterol you need. It is carried from the liver by the blood through the blood vessels. Deposits (plaque) may build up on blood vessel walls. This makes the arteries narrower and stiffer. Plaque increases the risk for heart attack and stroke. You cannot feel your cholesterol level even if it is very high. The only way to know is by a blood test to check your lipid (fats) levels. Once you know your cholesterol levels, you should keep a record of the test results. Work with your caregiver to to keep your levels in the desired range. WHAT THE RESULTS MEAN:  Total cholesterol is a rough measure of all the cholesterol in your blood.  LDL is the so-called bad cholesterol. This is the type that deposits cholesterol in the walls of the arteries. You want this level to be low.  HDL is the good cholesterol because it cleans the arteries and carries the LDL away. You want this level to be high.  Triglycerides are fat that the body can either burn for energy or store. High levels are closely linked to heart disease. DESIRED LEVELS:  Total cholesterol below 200.  LDL below 100 for people at risk, below 70 for very high risk.  HDL above 50 is good, above 60 is best.  Triglycerides below 150. HOW TO LOWER YOUR  CHOLESTEROL:  Diet.  Choose fish or white meat chicken and [place], roasted or baked. Limit fatty cuts of red meat, fried foods, and processed meats, such as sausage and lunch meat.  Eat lots of fresh fruits and vegetables. Choose whole grains, beans, pasta, potatoes and cereals.  Use only small amounts of olive, corn or canola oils. Avoid butter, mayonnaise, shortening or palm kernel oils. Avoid foods with trans-fats.  Use skim/nonfat milk and low-fat/nonfat yogurt and cheeses. Avoid whole milk, cream, ice cream, egg yolks and cheeses. Healthy desserts include angel food cake, ginger snaps, animal crackers, hard candy, popsicles, and low-fat/nonfat frozen yogurt. Avoid pastries, cakes, pies and cookies.  Exercise.  A regular program helps decrease LDL and raises HDL.  Helps with weight control.  Do things that increase your activity level like gardening, walking, or taking the stairs.  Medication.  May be prescribed by your caregiver to help lowering cholesterol and the risk for heart disease.  You may need medicine even if your levels are normal if you have several risk factors. HOME CARE INSTRUCTIONS   Follow your diet and exercise programs as suggested by your caregiver.  Take medications as directed.  Have blood work done when your caregiver feels it is necessary. MAKE SURE YOU:   Understand these instructions.  Will watch your condition.  Will get help right away if you are not doing well or get worse. Document Released: 11/13/2000 Document Revised: 05/13/2011 Document Reviewed: 12/02/2012 [facility] Patient Information 2014 [place], [place].  Vitamin D Deficiency Vitamin D is an important vitamin that your body needs. Having too little of it in your body is called a deficiency. A very bad deficiency can make your bones soft and can cause a condition called rickets.  Vitamin D is important to your body for different reasons, such as:   It helps your body absorb 2  minerals called calcium and phosphorus.  It helps make your bones healthy.  It may prevent some diseases, such as diabetes and multiple sclerosis.  It helps your muscles and heart. You can get vitamin D  in several ways. It is a natural part of some foods. The vitamin is also added to some dairy products and cereals. Some people take vitamin D supplements. Also, your body makes vitamin D when you are in the sun. It changes the sun's rays into a form of the vitamin that your body can use. CAUSES   Not eating enough foods that contain vitamin D.  Not getting enough sunlight.  Having certain digestive system diseases that make it hard to absorb vitamin D. These diseases include Crohn's disease, chronic pancreatitis, and cystic fibrosis.  Having a surgery in which part of the stomach or small intestine is removed.  Being obese. Fat cells pull vitamin D out of your blood. That means that obese people may not have enough vitamin D left in their blood and in other body tissues.  Having chronic kidney or liver disease. RISK FACTORS Risk factors are things that make you more likely to develop a vitamin D deficiency. They include:  Being older.  Not being able to get outside very much.  Living in a nursing home.  Having had broken bones.  Having weak or thin bones (osteoporosis).  Having a disease or condition that changes how your body absorbs vitamin D.  Having dark skin.  Some medicines such as seizure medicines or steroids.  Being overweight or obese. SYMPTOMS Mild cases of vitamin D deficiency may not have any symptoms. If you have a very bad case, symptoms may include:  Bone pain.  Muscle pain.  Falling often.  Broken bones caused by a minor injury, due to osteoporosis. DIAGNOSIS A blood test is the best way to tell if you have a vitamin D deficiency. TREATMENT Vitamin D deficiency can be treated in different ways. Treatment for vitamin D deficiency depends on what is  causing it. Options include:  Taking vitamin D supplements.  Taking a calcium supplement. Your caregiver will suggest what dose is best for you. HOME CARE INSTRUCTIONS  Take any supplements that your caregiver prescribes. Follow the directions carefully. Take only the suggested amount.  Have your blood tested 2 months after you start taking supplements.  Eat foods that contain vitamin D. Healthy choices include:  Fortified dairy products, cereals, or juices. Fortified means vitamin D has been added to the food. Check the label on the package to be sure.  Fatty fish like salmon or trout.  Eggs.  Oysters.  Do not use a tanning bed.  Keep your weight at a healthy level. Lose weight if you need to.  Keep all follow-up appointments. Your caregiver will need to perform blood tests to make sure your vitamin D deficiency is going away. SEEK MEDICAL CARE IF:  You have any questions about your treatment.  You continue to have symptoms of vitamin D deficiency.  You have nausea or vomiting.  You are constipated.  You feel confused.  You have severe abdominal or back pain. MAKE SURE YOU:  Understand these instructions.  Will watch your condition.  Will get help right away if you are not doing well or get worse. Document Released: 05/13/2011 Document Revised: 06/15/2012 Document Reviewed: 05/13/2011 [facility] Patient Information 2014 [place]. Diet for Gastroesophageal Reflux Disease, Adult Reflux (acid reflux) is when acid from your stomach flows up into the esophagus. When acid comes in contact with the esophagus, the acid causes irritation and soreness (inflammation) in the esophagus. When reflux happens often or so severely that it causes damage to the esophagus, it is called gastroesophageal reflux  disease (GERD). Nutrition therapy can help ease the discomfort of GERD. FOODS OR DRINKS TO AVOID OR LIMIT  Smoking or chewing tobacco. Nicotine is one of the most potent  stimulants to acid production in the gastrointestinal tract.  Caffeinated and decaffeinated coffee and black tea.  Regular or low-calorie carbonated beverages or energy drinks (caffeine-free carbonated beverages are allowed).   Strong spices, such as black pepper, white pepper, red pepper, cayenne, curry powder, and chili powder.  Peppermint or spearmint.  Chocolate.  High-fat foods, including meats and fried foods. Extra added fats including oils, butter, salad dressings, and nuts. Limit these to less than 8 tsp per day.  Fruits and vegetables if they are not tolerated, such as citrus fruits or tomatoes.  Alcohol.  Any food that seems to aggravate your condition. If you have questions regarding your diet, call your caregiver or a registered dietitian. OTHER THINGS THAT MAY HELP GERD INCLUDE:   Eating your meals slowly, in a relaxed setting.  Eating 5 to 6 small meals per day instead of 3 large meals.  Eliminating food for a period of time if it causes distress.  Not lying down until 3 hours after eating a meal.  Keeping the head of your bed raised 6 to 9 inches (15 to 23 cm) by using a foam wedge or blocks under the legs of the bed. Lying flat may make symptoms worse.  Being physically active. Weight loss may be helpful in reducing reflux in overweight or obese adults.  Wear loose fitting clothing EXAMPLE MEAL PLAN This meal plan is approximately 2,000 calories based on [email or web address] meal planning guidelines. Breakfast   cup cooked oatmeal.  1 cup strawberries.  1 cup low-fat milk.  1 oz almonds. Snack  1 cup cucumber slices.  6 oz yogurt (made from low-fat or fat-free milk). Lunch  2 slice whole-wheat bread.  2 oz sliced [place].  2 tsp mayonnaise.  1 cup blueberries.  1 cup snap peas. Snack  6 whole-wheat crackers.  1 oz string cheese. Dinner   cup brown rice.  1 cup mixed veggies.  1 tsp olive oil.  3 oz grilled fish. Document  Released: 02/18/2005 Document Revised: 05/13/2011 Document Reviewed: 01/04/2011 [facility] Patient Information 2014 [place], [place].

## 2013-03-15 ENCOUNTER — Encounter: Payer: Self-pay | Admitting: Internal Medicine

## 2013-03-15 ENCOUNTER — Ambulatory Visit (INDEPENDENT_AMBULATORY_CARE_PROVIDER_SITE_OTHER): Payer: BC Managed Care – PPO | Admitting: Internal Medicine

## 2013-03-15 VITALS — BP 116/76 | HR 76 | Temp 97.7°F | Resp 18 | Wt 205.6 lb

## 2013-03-15 DIAGNOSIS — E559 Vitamin D deficiency, unspecified: Secondary | ICD-10-CM

## 2013-03-15 DIAGNOSIS — R030 Elevated blood-pressure reading, without diagnosis of hypertension: Secondary | ICD-10-CM

## 2013-03-15 DIAGNOSIS — K219 Gastro-esophageal reflux disease without esophagitis: Secondary | ICD-10-CM

## 2013-03-15 DIAGNOSIS — I10 Essential (primary) hypertension: Secondary | ICD-10-CM

## 2013-03-15 DIAGNOSIS — R7309 Other abnormal glucose: Secondary | ICD-10-CM

## 2013-03-15 DIAGNOSIS — Z79899 Other long term (current) drug therapy: Secondary | ICD-10-CM

## 2013-03-15 DIAGNOSIS — E782 Mixed hyperlipidemia: Secondary | ICD-10-CM

## 2013-03-15 LAB — CBC WITH DIFFERENTIAL/PLATELET
Basophils Absolute: 0 10*3/uL (ref 0.0–0.1)
Basophils Relative: 1 % (ref 0–1)
EOS ABS: 0.2 10*3/uL (ref 0.0–0.7)
EOS PCT: 3 % (ref 0–5)
HCT: 40.1 % (ref 36.0–46.0)
Hemoglobin: 13.5 g/dL (ref 12.0–15.0)
LYMPHS ABS: 1.7 10*3/uL (ref 0.7–4.0)
Lymphocytes Relative: 36 % (ref 12–46)
MCH: 30.2 pg (ref 26.0–34.0)
MCHC: 33.7 g/dL (ref 30.0–36.0)
MCV: 89.7 fL (ref 78.0–100.0)
MONO ABS: 0.5 10*3/uL (ref 0.1–1.0)
Monocytes Relative: 11 % (ref 3–12)
Neutro Abs: 2.3 10*3/uL (ref 1.7–7.7)
Neutrophils Relative %: 49 % (ref 43–77)
PLATELETS: 216 10*3/uL (ref 150–400)
RBC: 4.47 MIL/uL (ref 3.87–5.11)
RDW: 13.2 % (ref 11.5–15.5)
WBC: 4.7 10*3/uL (ref 4.0–10.5)

## 2013-03-15 MED ORDER — OMEPRAZOLE 40 MG PO CPDR
40.0000 mg | DELAYED_RELEASE_CAPSULE | Freq: Every day | ORAL | Status: DC
Start: 2013-03-15 — End: 2013-07-02

## 2013-03-16 LAB — LIPID PANEL
CHOLESTEROL: 240 mg/dL — AB (ref 0–200)
HDL: 37 mg/dL — ABNORMAL LOW (ref >39)
LDL Cholesterol: 154 mg/dL — ABNORMAL HIGH (ref 0–99)
Total CHOL/HDL Ratio: 6.5 Ratio
Triglycerides: 246 mg/dL — ABNORMAL HIGH (ref <150)
VLDL: 49 mg/dL — ABNORMAL HIGH (ref 0–40)

## 2013-03-16 LAB — HEPATIC FUNCTION PANEL
ALBUMIN: 4.3 g/dL (ref 3.5–5.2)
AST: 16 U/L (ref 0–37)
Alkaline Phosphatase: 122 U/L — ABNORMAL HIGH (ref 39–117)
BILIRUBIN TOTAL: 0.4 mg/dL (ref 0.3–1.2)
Bilirubin, Direct: 0.1 mg/dL (ref 0.0–0.3)
Indirect Bilirubin: 0.3 mg/dL (ref 0.0–0.9)
TOTAL PROTEIN: 6.9 g/dL (ref 6.0–8.3)

## 2013-03-16 LAB — BASIC METABOLIC PANEL WITH GFR
BUN: 10 mg/dL (ref 6–23)
CALCIUM: 10 mg/dL (ref 8.4–10.5)
CO2: 30 meq/L (ref 19–32)
CREATININE: 0.74 mg/dL (ref 0.50–1.10)
Chloride: 106 mEq/L (ref 96–112)
GFR, Est African American: >89 mL/min
GFR, Est Non African American: 88 mL/min
GLUCOSE: 79 mg/dL (ref 70–99)
Potassium: 4.7 mEq/L (ref 3.5–5.3)
Sodium: 142 mEq/L (ref 135–145)

## 2013-03-16 LAB — HEMOGLOBIN A1C
HEMOGLOBIN A1C: 5.5 % (ref <5.7)
MEAN PLASMA GLUCOSE: 111 mg/dL (ref <117)

## 2013-03-16 LAB — MAGNESIUM: MAGNESIUM: 1.9 mg/dL (ref 1.5–2.5)

## 2013-03-16 LAB — TSH: TSH: 3.485 u[IU]/mL (ref 0.350–4.500)

## 2013-03-16 LAB — VITAMIN D 25 HYDROXY (VIT D DEFICIENCY, FRACTURES): VIT D 25 HYDROXY: 44 ng/mL (ref 30–89)

## 2013-03-16 LAB — INSULIN, FASTING: INSULIN FASTING, SERUM: 11 u[IU]/mL (ref 3–28)

## 2013-03-18 ENCOUNTER — Other Ambulatory Visit: Payer: Self-pay | Admitting: Internal Medicine

## 2013-03-18 DIAGNOSIS — E782 Mixed hyperlipidemia: Secondary | ICD-10-CM

## 2013-03-18 MED ORDER — EZETIMIBE 10 MG PO TABS: 10.0000 mg | ORAL_TABLET | Freq: Every day | ORAL | Status: DC

## 2013-03-23 ENCOUNTER — Ambulatory Visit: Payer: Self-pay | Admitting: Emergency Medicine

## 2013-04-21 ENCOUNTER — Other Ambulatory Visit: Payer: Self-pay | Admitting: Internal Medicine

## 2013-04-21 MED ORDER — HYDROCODONE-ACETAMINOPHEN 5-325 MG PO TABS: ORAL_TABLET | ORAL | Status: DC

## 2013-06-08 ENCOUNTER — Ambulatory Visit (INDEPENDENT_AMBULATORY_CARE_PROVIDER_SITE_OTHER): Payer: BC Managed Care – PPO | Admitting: Physician Assistant

## 2013-06-08 ENCOUNTER — Encounter: Payer: Self-pay | Admitting: Physician Assistant

## 2013-06-08 VITALS — BP 122/70 | HR 88 | Temp 97.7°F | Resp 16 | Ht 66.5 in | Wt 206.0 lb

## 2013-06-08 DIAGNOSIS — J329 Chronic sinusitis, unspecified: Secondary | ICD-10-CM

## 2013-06-08 MED ORDER — PREDNISONE 20 MG PO TABS: ORAL_TABLET | ORAL | Status: DC

## 2013-06-08 MED ORDER — DEXAMETHASONE SODIUM PHOSPHATE 10 MG/ML IJ SOLN
10.0000 mg | Freq: Once | INTRAMUSCULAR | Status: AC
  Administered 2013-06-08: 10 mg via INTRAMUSCULAR

## 2013-06-08 MED ORDER — PROMETHAZINE-CODEINE 6.25-10 MG/5ML PO SYRP: 5.0000 mL | ORAL_SOLUTION | Freq: Four times a day (QID) | ORAL | Status: DC | PRN

## 2013-06-08 MED ORDER — SULFAMETHOXAZOLE-TMP DS 800-160 MG PO TABS: 1.0000 | ORAL_TABLET | Freq: Two times a day (BID) | ORAL | Status: DC

## 2013-06-08 MED ORDER — FLUCONAZOLE 150 MG PO TABS: 150.0000 mg | ORAL_TABLET | Freq: Every day | ORAL | Status: DC

## 2013-06-08 NOTE — Progress Notes (Signed)
   Subjective:    Patient ID: Teresa Vasquez, female    DOB: 02/15/1951, 63 y.o.   MRN: [id number]  Sinus Problem This is a new problem. Episode onset: 6-7 days. The problem has been gradually worsening since onset. There has been no fever. Associated symptoms include chills, congestion, coughing, headaches, sinus pressure, sneezing, a sore throat and swollen glands. Pertinent negatives include no diaphoresis, ear pain, hoarse voice, neck pain or shortness of breath. Treatments tried: flonase, cold meds, zyrtec.      Review of Systems  Constitutional: Positive for chills. Negative for diaphoresis.  HENT: Positive for congestion, sinus pressure, sneezing and sore throat. Negative for ear pain and hoarse voice.   Eyes: Positive for redness and itching. Negative for photophobia, pain, discharge and visual disturbance.  Respiratory: Positive for cough. Negative for shortness of breath.   Cardiovascular: Negative.   Gastrointestinal: Negative.   Genitourinary: Negative.   Musculoskeletal: Negative.  Negative for neck pain.  Neurological: Positive for headaches.       Objective:   Physical Exam  Constitutional: She is oriented to person, place, and time. She appears well-developed and well-nourished.  HENT:  Head: Normocephalic and atraumatic.  Right Ear: External ear normal.  Left Ear: External ear normal.  Nose: Right sinus exhibits maxillary sinus tenderness. Left sinus exhibits maxillary sinus tenderness.  Mouth/Throat: Oropharynx is clear and moist.  Eyes: Conjunctivae are normal. Pupils are equal, round, and reactive to light.  Neck: Normal range of motion. Neck supple.  Cardiovascular: Normal rate and regular rhythm.   Pulmonary/Chest: Effort normal. No respiratory distress. She has wheezes (diffuse expiratory). She has no rales. She exhibits no tenderness.  Abdominal: Soft. Bowel sounds are normal.  Lymphadenopathy:    She has no cervical adenopathy.  Neurological: She is  alert and oriented to person, place, and time.  Skin: Skin is warm and dry.       Assessment & Plan:  Sinusitis - Plan: predniSONE (DELTASONE) 20 MG tablet, sulfamethoxazole-trimethoprim (BACTRIM DS) 800-160 MG per tablet, promethazine-codeine (PHENERGAN WITH CODEINE) 6.25-10 MG/5ML syrup, dexamethasone (DECADRON) injection 10 mg

## 2013-06-08 NOTE — Patient Instructions (Signed)

## 2013-06-10 ENCOUNTER — Telehealth: Payer: Self-pay

## 2013-06-10 MED ORDER — ALBUTEROL SULFATE HFA 108 (90 BASE) MCG/ACT IN AERS: 2.0000 | INHALATION_SPRAY | Freq: Four times a day (QID) | RESPIRATORY_TRACT | Status: DC | PRN

## 2013-06-10 NOTE — Telephone Encounter (Signed)
Pt called and said inhaler for bronchitis wasn't at pharmacy. Do you still want her to use inhaler? Please advise. [facility] [place]

## 2013-06-11 NOTE — Telephone Encounter (Signed)
Return call to patients home, husband answered phone and advised that they had already picked up inhaler

## 2013-06-14 ENCOUNTER — Ambulatory Visit (INDEPENDENT_AMBULATORY_CARE_PROVIDER_SITE_OTHER): Payer: BC Managed Care – PPO | Admitting: Physician Assistant

## 2013-06-14 ENCOUNTER — Ambulatory Visit (HOSPITAL_COMMUNITY)
Admission: RE | Admit: 2013-06-14 | Discharge: 2013-06-14 | Disposition: A | Discharge status: Home / Self Care | Payer: BC Managed Care – PPO | Source: Ambulatory Visit | Attending: Physician Assistant | Admitting: Physician Assistant

## 2013-06-14 ENCOUNTER — Encounter: Payer: Self-pay | Admitting: Physician Assistant

## 2013-06-14 VITALS — BP 120/78 | HR 84 | Temp 97.7°F | Resp 16 | Ht 66.5 in | Wt 203.0 lb

## 2013-06-14 DIAGNOSIS — R05 Cough: Secondary | ICD-10-CM | POA: Insufficient documentation

## 2013-06-14 DIAGNOSIS — J40 Bronchitis, not specified as acute or chronic: Secondary | ICD-10-CM

## 2013-06-14 DIAGNOSIS — R059 Cough, unspecified: Secondary | ICD-10-CM | POA: Insufficient documentation

## 2013-06-14 DIAGNOSIS — J984 Other disorders of lung: Secondary | ICD-10-CM | POA: Insufficient documentation

## 2013-06-14 DIAGNOSIS — R0989 Other specified symptoms and signs involving the circulatory and respiratory systems: Secondary | ICD-10-CM | POA: Insufficient documentation

## 2013-06-14 MED ORDER — MONTELUKAST SODIUM 10 MG PO TABS: 10.0000 mg | ORAL_TABLET | Freq: Every day | ORAL | Status: DC

## 2013-06-14 NOTE — Progress Notes (Signed)
   Subjective:    Patient ID: Teresa Vasquez, female    DOB: [birth year], 63 y.o.   MRN: [id number]  Cough This is a recurrent problem. The current episode started 1 to 4 weeks ago. The problem has been unchanged. The cough is non-productive. Associated symptoms include chest pain (with coughing), headaches, myalgias, postnasal drip, rhinorrhea, shortness of breath and wheezing. Pertinent negatives include no chills, ear pain, fever, nasal congestion, rash or sore throat. Treatments tried: nebulizer.    Review of Systems  Constitutional: Positive for fatigue. Negative for fever and chills.  HENT: Positive for congestion, postnasal drip, rhinorrhea and sinus pressure. Negative for dental problem, ear discharge, ear pain, nosebleeds, sore throat, trouble swallowing and voice change.   Respiratory: Positive for cough, chest tightness, shortness of breath and wheezing.   Cardiovascular: Positive for chest pain (with coughing).  Gastrointestinal: Negative.   Genitourinary: Negative.   Musculoskeletal: Positive for myalgias.  Skin: Negative for rash.  Neurological: Positive for headaches.       Objective:   Physical Exam  Constitutional: She is oriented to person, place, and time. She appears well-developed and well-nourished.  HENT:  Head: Normocephalic and atraumatic.  Right Ear: External ear normal.  Left Ear: External ear normal.  Nose: Nose normal.  Mouth/Throat: Oropharynx is clear and moist.  Eyes: Conjunctivae are normal. Pupils are equal, round, and reactive to light.  Neck: Normal range of motion. Neck supple.  Cardiovascular: Normal rate and regular rhythm.   Pulmonary/Chest: Effort normal. No respiratory distress. She has wheezes. She has no rales. She exhibits no tenderness.  Abdominal: Soft. Bowel sounds are normal.  Lymphadenopathy:    She has no cervical adenopathy.  Neurological: She is alert and oriented to person, place, and time.  Skin: Skin is warm and dry.       Assessment & Plan:  1. Bronchitis, not specified as acute or chronic Sample of aerospan inhaler, nebulizer given to patient, finish ABX - DG Chest 2 View; Future- normal -If not better will get PFTs and refer to pulmonary.

## 2013-06-14 NOTE — Patient Instructions (Signed)
Acute Bronchitis Bronchitis is inflammation of the airways that extend from the windpipe into the lungs (bronchi). The inflammation often causes mucus to develop. This leads to a cough, which is the most common symptom of bronchitis.  In acute bronchitis, the condition usually develops suddenly and goes away over time, usually in a couple weeks. Smoking, allergies, and asthma can make bronchitis worse. Repeated episodes of bronchitis may cause further lung problems.  CAUSES Acute bronchitis is most often caused by the same virus that causes a cold. The virus can spread from person to person (contagious).  SIGNS AND SYMPTOMS   Cough.   Fever.   Coughing up mucus.   Body aches.   Chest congestion.   Chills.   Shortness of breath.   Sore throat.  DIAGNOSIS  Acute bronchitis is usually diagnosed through a physical exam. Tests, such as chest X-rays, are sometimes done to rule out other conditions.  TREATMENT  Acute bronchitis usually goes away in a couple weeks. Often times, no medical treatment is necessary. Medicines are sometimes given for relief of fever or cough. Antibiotics are usually not needed but may be prescribed in certain situations. In some cases, an inhaler may be recommended to help reduce shortness of breath and control the cough. A cool mist vaporizer may also be used to help thin bronchial secretions and make it easier to clear the chest.  HOME CARE INSTRUCTIONS  Get plenty of rest.   Drink enough fluids to keep your urine clear or pale yellow (unless you have a medical condition that requires fluid restriction). Increasing fluids may help thin your secretions and will prevent dehydration.   Only take over-the-counter or prescription medicines as directed by your health care provider.   Avoid smoking and secondhand smoke. Exposure to cigarette smoke or irritating chemicals will make bronchitis worse. If you are a smoker, consider using nicotine gum or skin  patches to help control withdrawal symptoms. Quitting smoking will help your lungs heal faster.   Reduce the chances of another bout of acute bronchitis by washing your hands frequently, avoiding people with cold symptoms, and trying not to touch your hands to your mouth, nose, or eyes.   Follow up with your health care provider as directed.  SEEK MEDICAL CARE IF: Your symptoms do not improve after 1 week of treatment.  SEEK IMMEDIATE MEDICAL CARE IF:  You develop an increased fever or chills.   You have chest pain.   You have severe shortness of breath.  You have bloody sputum.   You develop dehydration.  You develop fainting.  You develop repeated vomiting.  You develop a severe headache. MAKE SURE YOU:   Understand these instructions.  Will watch your condition.  Will get help right away if you are not doing well or get worse. Document Released: 03/28/2004 Document Revised: 10/21/2012 Document Reviewed: 08/11/2012 [employer] Patient Information 2014 [employer].  

## 2013-06-15 ENCOUNTER — Ambulatory Visit: Payer: Self-pay | Admitting: Emergency Medicine

## 2013-06-30 ENCOUNTER — Other Ambulatory Visit: Payer: Self-pay | Admitting: Internal Medicine

## 2013-07-02 ENCOUNTER — Other Ambulatory Visit: Payer: Self-pay | Admitting: Internal Medicine

## 2013-07-27 ENCOUNTER — Ambulatory Visit: Payer: Self-pay | Admitting: Emergency Medicine

## 2013-09-15 ENCOUNTER — Ambulatory Visit (INDEPENDENT_AMBULATORY_CARE_PROVIDER_SITE_OTHER): Payer: BC Managed Care – PPO | Admitting: Emergency Medicine

## 2013-09-15 ENCOUNTER — Ambulatory Visit
Admission: RE | Admit: 2013-09-15 | Discharge: 2013-09-15 | Disposition: A | Discharge status: Home / Self Care | Payer: BC Managed Care – PPO | Source: Ambulatory Visit | Attending: Emergency Medicine | Admitting: Emergency Medicine

## 2013-09-15 ENCOUNTER — Encounter: Payer: Self-pay | Admitting: Emergency Medicine

## 2013-09-15 ENCOUNTER — Other Ambulatory Visit: Payer: Self-pay | Admitting: Emergency Medicine

## 2013-09-15 VITALS — BP 106/64 | HR 86 | Temp 98.2°F | Resp 16 | Ht 66.5 in | Wt 202.0 lb

## 2013-09-15 DIAGNOSIS — Z79899 Other long term (current) drug therapy: Secondary | ICD-10-CM

## 2013-09-15 DIAGNOSIS — I10 Essential (primary) hypertension: Secondary | ICD-10-CM

## 2013-09-15 DIAGNOSIS — K5900 Constipation, unspecified: Secondary | ICD-10-CM

## 2013-09-15 DIAGNOSIS — R5383 Other fatigue: Secondary | ICD-10-CM

## 2013-09-15 DIAGNOSIS — E559 Vitamin D deficiency, unspecified: Secondary | ICD-10-CM

## 2013-09-15 DIAGNOSIS — Z0000 Encounter for general adult medical examination without abnormal findings: Secondary | ICD-10-CM

## 2013-09-15 DIAGNOSIS — E782 Mixed hyperlipidemia: Secondary | ICD-10-CM

## 2013-09-15 DIAGNOSIS — R5381 Other malaise: Secondary | ICD-10-CM

## 2013-09-15 LAB — HEMOGLOBIN A1C
Hgb A1c MFr Bld: 5.5 %
Mean Plasma Glucose: 111 mg/dL

## 2013-09-15 LAB — CBC WITH DIFFERENTIAL/PLATELET
Basophils Absolute: 0 10*3/uL (ref 0.0–0.1)
Basophils Relative: 0 % (ref 0–1)
EOS ABS: 0.1 10*3/uL (ref 0.0–0.7)
Eosinophils Relative: 2 % (ref 0–5)
HCT: 42.1 % (ref 36.0–46.0)
HEMOGLOBIN: 14.3 g/dL (ref 12.0–15.0)
Lymphocytes Relative: 30 % (ref 12–46)
Lymphs Abs: 1.4 10*3/uL (ref 0.7–4.0)
MCH: 30.3 pg (ref 26.0–34.0)
MCHC: 34 g/dL (ref 30.0–36.0)
MCV: 89.2 fL (ref 78.0–100.0)
MONOS PCT: 7 % (ref 3–12)
Monocytes Absolute: 0.3 10*3/uL (ref 0.1–1.0)
NEUTROS ABS: 2.9 10*3/uL (ref 1.7–7.7)
Neutrophils Relative %: 61 % (ref 43–77)
Platelets: 213 10*3/uL (ref 150–400)
RBC: 4.72 MIL/uL (ref 3.87–5.11)
RDW: 13.3 % (ref 11.5–15.5)
WBC: 4.8 10*3/uL (ref 4.0–10.5)

## 2013-09-15 NOTE — Patient Instructions (Signed)

## 2013-09-15 NOTE — Progress Notes (Signed)
Subjective:    Patient ID: Teresa Vasquez, female    DOB: 1951/02/22, 63 y.o.   MRN: [id number]  HPI Comments: 63 yo WF CPE and presents for 3 month F/U for HTN, Cholesterol, Pre-Dm, D. Deficient. She has not been exercising routinely. She has been trying to eat smaller portions. She has been taking Zetia AD. Last labs ABN:T 240 TG 246 L 154 H 37 A1C 5.5  She does take OTC reflux medicine QD and Omeprazole every 3rd day. She is still having increased gas and constipation. She does use Miralax to help with constipation. SHe notes diarrhea with greasy initially but now it is coming more often without food trigger x 2 months. She does have cramping with bowel movements. She does have history of 15 inches of colon removed.   She has been under increased stress with husbands health but feels she is dealing with it better.    Medication List       This list is accurate as of: 09/15/13  2:41 PM.  Always use your most recent med list.               albuterol 108 (90 BASE) MCG/ACT inhaler  Commonly known as:  PROVENTIL HFA;VENTOLIN HFA  Inhale 2 puffs into the lungs every 6 (six) hours as needed for wheezing or shortness of breath.     ALPRAZolam 1 MG tablet  Commonly known as:  XANAX  TAKE 1/2 TO 1 TABLET TWICE A DAY     aspirin 81 MG tablet  Take 81 mg by mouth daily.     ezetimibe 10 MG tablet  Commonly known as:  ZETIA  Take 1 tablet (10 mg total) by mouth daily.     fluticasone 50 MCG/ACT nasal spray  Commonly known as:  FLONASE     montelukast 10 MG tablet  Commonly known as:  SINGULAIR  Take 1 tablet (10 mg total) by mouth daily.     omeprazole 40 MG capsule  Commonly known as:  PRILOSEC  TAKE 1 CAPSULE (40 MG TOTAL) BY MOUTH DAILY.     VITAMIN D PO  Take 5,000 Units by mouth. daily       Allergies  Allergen Reactions  . Atorvastatin   . Levaquin [Levofloxacin In D5w] Nausea Only   Past Medical History  Diagnosis Date  . Anxiety and depression   .  Diverticulitis   . Irritable bowel syndrome   . Depression   . Hypertension   . Hyperlipidemia    Past Surgical History  Procedure Laterality Date  . Tonsillectomy    . Vaginal hysterectomy    . Colon surgery      History  Substance Use Topics  . Smoking status: Never Smoker   . Smokeless tobacco: Never Used  . Alcohol Use: Yes    Family History  Problem Relation Age of Onset  . Heart disease Father   . Heart disease Mother   . Hypertension Mother   . Osteoporosis Mother   . Heart attack Brother   . Heart disease Brother   . Irritable bowel syndrome Sister     MAINTENANCE: Colonoscopy:05/2009  Polyp due 2016 Mammo:2012 WNL FGH:[id number] Pap/ Pelvic:2012 wnl EYE: Dentist:q 6 month CXR:06/2013  NM stress:08/05/12  IMMUNIZATIONS: Tdap:08/2012   Patient Care Team: Unk Pinto, MD as PCP - General (Internal Medicine) Rozetta Nunnery, MD as Consulting Physician (Otolaryngology) Thayer Headings, MD as Consulting Physician (Cardiology) Inda Castle, MD as Consulting Physician (Gastroenterology) Cheral Bay  Harrington Challenger, MD as Consulting Physician (Obstetrics and Gynecology)   Review of Systems BP 106/64  Pulse 86  Temp(Src) 98.2 F (36.8 C) (Temporal)  Resp 16  Ht 5' 6.5" (1.689 m)  Wt 202 lb (91.627 kg)  BMI 32.12 kg/m2     Objective:   Physical Exam  Nursing note and vitals reviewed. Constitutional: She is oriented to person, place, and time. She appears well-developed and well-nourished. No distress.  overweight  HENT:  Head: Normocephalic and atraumatic.  Right Ear: External ear normal.  Left Ear: External ear normal.  Nose: Nose normal.  Mouth/Throat: Oropharynx is clear and moist.  Eyes: Conjunctivae and EOM are normal. Pupils are equal, round, and reactive to light. Right eye exhibits no discharge. Left eye exhibits no discharge. No scleral icterus.  Neck: Normal range of motion. Neck supple. No JVD present. No tracheal deviation present. No thyromegaly  present.  Cardiovascular: Normal rate, regular rhythm, normal heart sounds and intact distal pulses.   Pulmonary/Chest: Effort normal and breath sounds normal.  Abdominal: Soft. Bowel sounds are normal. She exhibits no distension and no mass. There is no tenderness. There is no rebound and no guarding.  Genitourinary:  Def gyn  Musculoskeletal: Normal range of motion. She exhibits no edema and no tenderness.  Lymphadenopathy:    She has no cervical adenopathy.  Neurological: She is alert and oriented to person, place, and time. She has normal reflexes. No cranial nerve deficit. She exhibits normal muscle tone. Coordination normal.  Skin: Skin is warm and dry. No rash noted. No erythema. No pallor.  Mid chest 3 mm pearly elevation with erythematous borders   Psychiatric: She has a normal mood and affect. Her behavior is normal. Judgment and thought content normal.      AORTA SCAN WNL EKG NSCSPT     Assessment & Plan:  1. CPE- Update screening labs/ History/ Immunizations/ Testing as needed. Advised healthy diet, QD exercise, increase H20 and continue RX/ Vitamins AD.   2. Constipation- Increase fiber/ water intake, decrease caffeine, increase activity level, can add Miralax or Metamucil QD until BM soft May start Linzess if XRAY NEG. SX # 4 boxes given vs GI f/u if no improvement  3. Cholesterol- recheck labs, Need to eat healthier and exercise AD.   4.  Irreg Nevi ? BCC- monitor for any change, call for removal

## 2013-09-16 LAB — FOLATE RBC: RBC FOLATE: 289 ng/mL (ref >280)

## 2013-09-16 LAB — BASIC METABOLIC PANEL WITHOUT GFR
BUN: 11 mg/dL (ref 6–23)
CO2: 27 meq/L (ref 19–32)
Calcium: 9.6 mg/dL (ref 8.4–10.5)
Chloride: 102 meq/L (ref 96–112)
Creat: 0.81 mg/dL (ref 0.50–1.10)
GFR, Est African American: >89 mL/min
GFR, Est Non African American: 79 mL/min
Glucose, Bld: 77 mg/dL (ref 70–99)
Potassium: 4.2 meq/L (ref 3.5–5.3)
Sodium: 142 meq/L (ref 135–145)

## 2013-09-16 LAB — URINALYSIS, ROUTINE W REFLEX MICROSCOPIC
BILIRUBIN URINE: NEGATIVE
Glucose, UA: NEGATIVE mg/dL
Hgb urine dipstick: NEGATIVE
Ketones, ur: NEGATIVE mg/dL
Nitrite: NEGATIVE
Protein, ur: NEGATIVE mg/dL
Specific Gravity, Urine: 1.016 (ref 1.005–1.030)
UROBILINOGEN UA: 0.2 mg/dL (ref 0.0–1.0)
pH: 5.5 (ref 5.0–8.0)

## 2013-09-16 LAB — HEPATIC FUNCTION PANEL
ALBUMIN: 4.6 g/dL (ref 3.5–5.2)
ALT: 11 U/L (ref 0–35)
AST: 20 U/L (ref 0–37)
Alkaline Phosphatase: 125 U/L — ABNORMAL HIGH (ref 39–117)
Bilirubin, Direct: 0.1 mg/dL (ref 0.0–0.3)
Indirect Bilirubin: 0.6 mg/dL (ref 0.2–1.2)
TOTAL PROTEIN: 7 g/dL (ref 6.0–8.3)
Total Bilirubin: 0.7 mg/dL (ref 0.2–1.2)

## 2013-09-16 LAB — LIPID PANEL
Cholesterol: 219 mg/dL — ABNORMAL HIGH (ref 0–200)
HDL: 39 mg/dL — AB (ref >39)
LDL Cholesterol: 141 mg/dL — ABNORMAL HIGH (ref 0–99)
TRIGLYCERIDES: 197 mg/dL — AB (ref <150)
Total CHOL/HDL Ratio: 5.6 Ratio
VLDL: 39 mg/dL (ref 0–40)

## 2013-09-16 LAB — URINALYSIS, MICROSCOPIC ONLY
Bacteria, UA: NONE SEEN
CASTS: NONE SEEN
Crystals: NONE SEEN

## 2013-09-16 LAB — IRON AND TIBC
%SAT: 38 % (ref 20–55)
IRON: 127 ug/dL (ref 42–145)
TIBC: 334 ug/dL (ref 250–470)
UIBC: 207 ug/dL (ref 125–400)

## 2013-09-16 LAB — TSH: TSH: 3.352 u[IU]/mL (ref 0.350–4.500)

## 2013-09-16 LAB — MICROALBUMIN / CREATININE URINE RATIO
Creatinine, Urine: 142.9 mg/dL
MICROALB UR: 1.28 mg/dL (ref 0.00–1.89)
Microalb Creat Ratio: 9 mg/g (ref 0.0–30.0)

## 2013-09-16 LAB — INSULIN, FASTING: Insulin fasting, serum: 10 u[IU]/mL (ref 3–28)

## 2013-09-16 LAB — VITAMIN B12: Vitamin B-12: 612 pg/mL (ref 211–911)

## 2013-09-16 LAB — MAGNESIUM: MAGNESIUM: 1.9 mg/dL (ref 1.5–2.5)

## 2013-09-16 LAB — VITAMIN D 25 HYDROXY (VIT D DEFICIENCY, FRACTURES): Vit D, 25-Hydroxy: 48 ng/mL (ref 30–89)

## 2013-09-18 LAB — URINE CULTURE: Colony Count: >=100000

## 2013-09-19 ENCOUNTER — Other Ambulatory Visit: Payer: Self-pay | Admitting: Emergency Medicine

## 2013-09-19 MED ORDER — SULFAMETHOXAZOLE-TMP DS 800-160 MG PO TABS: 1.0000 | ORAL_TABLET | Freq: Two times a day (BID) | ORAL | Status: DC

## 2013-09-20 ENCOUNTER — Other Ambulatory Visit: Payer: Self-pay | Admitting: Emergency Medicine

## 2013-09-20 ENCOUNTER — Encounter: Payer: Self-pay | Admitting: *Deleted

## 2013-10-18 ENCOUNTER — Other Ambulatory Visit: Payer: Self-pay | Admitting: Physician Assistant

## 2013-10-26 ENCOUNTER — Other Ambulatory Visit: Payer: Self-pay | Admitting: Emergency Medicine

## 2013-11-23 ENCOUNTER — Encounter: Payer: Self-pay | Admitting: Physician Assistant

## 2013-11-23 ENCOUNTER — Ambulatory Visit (INDEPENDENT_AMBULATORY_CARE_PROVIDER_SITE_OTHER): Payer: BC Managed Care – PPO | Admitting: Physician Assistant

## 2013-11-23 VITALS — BP 122/70 | HR 88 | Temp 97.7°F | Resp 16 | Ht 66.5 in | Wt 205.0 lb

## 2013-11-23 DIAGNOSIS — R1031 Right lower quadrant pain: Secondary | ICD-10-CM

## 2013-11-23 DIAGNOSIS — G8929 Other chronic pain: Secondary | ICD-10-CM

## 2013-11-23 DIAGNOSIS — N390 Urinary tract infection, site not specified: Secondary | ICD-10-CM

## 2013-11-23 LAB — CBC WITH DIFFERENTIAL/PLATELET
BASOS ABS: 0 10*3/uL (ref 0.0–0.1)
BASOS PCT: 0 % (ref 0–1)
EOS ABS: 0.2 10*3/uL (ref 0.0–0.7)
Eosinophils Relative: 4 % (ref 0–5)
HCT: 40.1 % (ref 36.0–46.0)
Hemoglobin: 13.4 g/dL (ref 12.0–15.0)
Lymphocytes Relative: 30 % (ref 12–46)
Lymphs Abs: 1.5 10*3/uL (ref 0.7–4.0)
MCH: 30 pg (ref 26.0–34.0)
MCHC: 33.4 g/dL (ref 30.0–36.0)
MCV: 89.9 fL (ref 78.0–100.0)
MONOS PCT: 7 % (ref 3–12)
Monocytes Absolute: 0.3 10*3/uL (ref 0.1–1.0)
NEUTROS ABS: 2.9 10*3/uL (ref 1.7–7.7)
NEUTROS PCT: 59 % (ref 43–77)
PLATELETS: 198 10*3/uL (ref 150–400)
RBC: 4.46 MIL/uL (ref 3.87–5.11)
RDW: 13.7 % (ref 11.5–15.5)
WBC: 4.9 10*3/uL (ref 4.0–10.5)

## 2013-11-23 LAB — BASIC METABOLIC PANEL WITH GFR
BUN: 10 mg/dL (ref 6–23)
CALCIUM: 9.7 mg/dL (ref 8.4–10.5)
CO2: 29 mEq/L (ref 19–32)
Chloride: 107 mEq/L (ref 96–112)
Creat: 0.92 mg/dL (ref 0.50–1.10)
GFR, EST NON AFRICAN AMERICAN: 68 mL/min
GFR, Est African American: 78 mL/min
GLUCOSE: 84 mg/dL (ref 70–99)
POTASSIUM: 4.3 meq/L (ref 3.5–5.3)
SODIUM: 143 meq/L (ref 135–145)

## 2013-11-23 LAB — HEPATIC FUNCTION PANEL
ALT: <8 U/L (ref 0–35)
AST: 17 U/L (ref 0–37)
Albumin: 4.3 g/dL (ref 3.5–5.2)
Alkaline Phosphatase: 122 U/L — ABNORMAL HIGH (ref 39–117)
BILIRUBIN DIRECT: 0.1 mg/dL (ref 0.0–0.3)
BILIRUBIN INDIRECT: 0.4 mg/dL (ref 0.2–1.2)
BILIRUBIN TOTAL: 0.5 mg/dL (ref 0.2–1.2)
Total Protein: 6.6 g/dL (ref 6.0–8.3)

## 2013-11-23 MED ORDER — ACETAMINOPHEN-CODEINE #3 300-30 MG PO TABS: 1.0000 | ORAL_TABLET | ORAL | Status: DC | PRN

## 2013-11-23 NOTE — Progress Notes (Signed)
   Subjective:    Patient ID: Teresa Vasquez, female    DOB: 01-11-1951, 63 y.o.   MRN: [id number]  HPI 63 y.o. female with right sided AB pain since 6 months, getting worse the last 2 weeks. It is intermittent right sided lower AB pain sharp at time that radiates to her lower back/flank with nausea/hot flashes, no vomiting. She has been having diarrhea intermittently with it as well, occ with or without the pain. She feels weak after the diarrhea. The nausea has been waking her up at night. It is worse with greasy food/fatty food.  She has been taking phenergan, omeprazole which has helped some, heating pad helps some. She has had some chills but no fever. She has a history of IBS, diverticulitis and has seen Dr. Deatra Ina in the past with last colonoscopy 2011 due 2016. She is s/p colon resection in 2012. She was treated with bactrim for a UTI in July.    Review of Systems  Constitutional: Positive for chills and fatigue. Negative for fever, diaphoresis, activity change, appetite change and unexpected weight change.  HENT: Negative.   Respiratory: Negative.  Negative for cough.   Cardiovascular: Negative.   Gastrointestinal: Positive for nausea, abdominal pain and diarrhea. Negative for vomiting, constipation, blood in stool, abdominal distention, anal bleeding and rectal pain.  Genitourinary: Positive for frequency and flank pain. Negative for dysuria, urgency, hematuria, decreased urine volume, vaginal bleeding, vaginal discharge, difficulty urinating, genital sores, vaginal pain, menstrual problem, pelvic pain and dyspareunia.  Musculoskeletal: Negative for arthralgias, back pain, gait problem, joint swelling, myalgias, neck pain and neck stiffness.  Skin: Negative.   Neurological: Positive for dizziness. Negative for headaches.       Objective:   Physical Exam  Vitals reviewed. Constitutional: She is oriented to person, place, and time. She appears well-developed and well-nourished.   Neck: Normal range of motion. Neck supple.  Cardiovascular: Normal rate and regular rhythm.   Pulmonary/Chest: Effort normal and breath sounds normal.  Abdominal: Soft. Bowel sounds are normal. She exhibits no mass. There is tenderness in the right lower quadrant and left lower quadrant. There is no rigidity, no rebound, no guarding, no tenderness at McBurney's point and negative Murphy's sign.  + Left CVA tenderness  Neurological: She is alert and oriented to person, place, and time.  Skin: Skin is warm and dry. No rash noted.      Assessment & Plan:  Generalized pain, worse RLQ, no rebound, no peritoneal signs.  Treat out patient, get labs ? Diverticulitis, Kidney stone, Colitis, Recurrent UTI Suggest follow up with Dr. Deatra Ina since due 2016 ? Need for linzess Cipro/Flaygl she would prefer to avoid this, liquid diet/bland diet, continue fluids, nausea med, PPI, Tylenol # 3 for pain Get CT AB rule out diverticulitis, kidney stone, etc If worse pain please go to the ER

## 2013-11-23 NOTE — Patient Instructions (Signed)

## 2013-11-24 LAB — URINALYSIS, ROUTINE W REFLEX MICROSCOPIC
BILIRUBIN URINE: NEGATIVE
GLUCOSE, UA: NEGATIVE mg/dL
Hgb urine dipstick: NEGATIVE
Ketones, ur: NEGATIVE mg/dL
Leukocytes, UA: NEGATIVE
Nitrite: NEGATIVE
Protein, ur: NEGATIVE mg/dL
SPECIFIC GRAVITY, URINE: 1.015 (ref 1.005–1.030)
Urobilinogen, UA: 0.2 mg/dL (ref 0.0–1.0)
pH: 5.5 (ref 5.0–8.0)

## 2013-11-25 LAB — URINE CULTURE
COLONY COUNT: NO GROWTH
ORGANISM ID, BACTERIA: NO GROWTH

## 2013-11-26 ENCOUNTER — Encounter: Payer: Self-pay | Admitting: Physician Assistant

## 2013-12-01 ENCOUNTER — Other Ambulatory Visit: Payer: Self-pay | Admitting: Physician Assistant

## 2013-12-01 DIAGNOSIS — R109 Unspecified abdominal pain: Secondary | ICD-10-CM

## 2013-12-03 ENCOUNTER — Encounter: Payer: Self-pay | Admitting: Physician Assistant

## 2013-12-06 ENCOUNTER — Other Ambulatory Visit: Payer: BC Managed Care – PPO

## 2013-12-08 ENCOUNTER — Other Ambulatory Visit: Payer: Self-pay | Admitting: Internal Medicine

## 2013-12-14 ENCOUNTER — Telehealth: Payer: Self-pay

## 2013-12-14 ENCOUNTER — Telehealth: Payer: Self-pay | Admitting: Gastroenterology

## 2013-12-14 NOTE — Telephone Encounter (Signed)
Received a paper note from front office staff, patient states still not feeling well, having a lot of abdominal discomfort, nausea, pain in right side, per Vicie Mutters, PA I advised patient that she thought she would benefit from seeing a GI doctor, called [facility] and made appt for patient to be seen 12-21-2013, advised her to arrive at 2:15 to see Nicoletta Ba, she verbalized understanding but wasn't happy that they could not see her sooner and advised she may give them a call, advised patient per Vicie Mutters, PA that she could also go to ER if she felt she needed to be seen sooner and no appointment available.

## 2013-12-14 NOTE — Telephone Encounter (Signed)
Has been receiving treatment from PCP for these problems, but not getting better. Appointment made with Nicoletta Ba.

## 2013-12-17 ENCOUNTER — Emergency Department (HOSPITAL_COMMUNITY): Payer: BC Managed Care – PPO

## 2013-12-17 ENCOUNTER — Other Ambulatory Visit: Payer: Self-pay

## 2013-12-17 ENCOUNTER — Ambulatory Visit (HOSPITAL_COMMUNITY)
Admission: EM | Admit: 2013-12-17 | Discharge: 2013-12-18 | Disposition: A | Discharge status: Home / Self Care | Payer: BC Managed Care – PPO | Attending: General Surgery | Admitting: General Surgery

## 2013-12-17 ENCOUNTER — Observation Stay (HOSPITAL_COMMUNITY): Payer: BC Managed Care – PPO | Admitting: Anesthesiology

## 2013-12-17 ENCOUNTER — Encounter (HOSPITAL_COMMUNITY)
Admission: EM | Disposition: A | Discharge status: Home / Self Care | Payer: Self-pay | Source: Home / Self Care | Attending: Emergency Medicine

## 2013-12-17 ENCOUNTER — Encounter (HOSPITAL_COMMUNITY): Payer: BC Managed Care – PPO | Admitting: Anesthesiology

## 2013-12-17 ENCOUNTER — Encounter (HOSPITAL_COMMUNITY): Payer: Self-pay | Admitting: Emergency Medicine

## 2013-12-17 ENCOUNTER — Observation Stay (HOSPITAL_COMMUNITY): Payer: BC Managed Care – PPO

## 2013-12-17 DIAGNOSIS — K589 Irritable bowel syndrome without diarrhea: Secondary | ICD-10-CM | POA: Insufficient documentation

## 2013-12-17 DIAGNOSIS — F419 Anxiety disorder, unspecified: Secondary | ICD-10-CM | POA: Insufficient documentation

## 2013-12-17 DIAGNOSIS — E876 Hypokalemia: Secondary | ICD-10-CM | POA: Insufficient documentation

## 2013-12-17 DIAGNOSIS — K8000 Calculus of gallbladder with acute cholecystitis without obstruction: Secondary | ICD-10-CM

## 2013-12-17 DIAGNOSIS — Z7982 Long term (current) use of aspirin: Secondary | ICD-10-CM | POA: Insufficient documentation

## 2013-12-17 DIAGNOSIS — K8020 Calculus of gallbladder without cholecystitis without obstruction: Secondary | ICD-10-CM

## 2013-12-17 DIAGNOSIS — E785 Hyperlipidemia, unspecified: Secondary | ICD-10-CM | POA: Diagnosis not present

## 2013-12-17 DIAGNOSIS — Z888 Allergy status to other drugs, medicaments and biological substances status: Secondary | ICD-10-CM | POA: Insufficient documentation

## 2013-12-17 DIAGNOSIS — I10 Essential (primary) hypertension: Secondary | ICD-10-CM | POA: Insufficient documentation

## 2013-12-17 DIAGNOSIS — Z881 Allergy status to other antibiotic agents status: Secondary | ICD-10-CM | POA: Diagnosis not present

## 2013-12-17 DIAGNOSIS — K8010 Calculus of gallbladder with chronic cholecystitis without obstruction: Secondary | ICD-10-CM | POA: Diagnosis not present

## 2013-12-17 DIAGNOSIS — Z79899 Other long term (current) drug therapy: Secondary | ICD-10-CM | POA: Insufficient documentation

## 2013-12-17 DIAGNOSIS — F329 Major depressive disorder, single episode, unspecified: Secondary | ICD-10-CM | POA: Diagnosis not present

## 2013-12-17 HISTORY — PX: CHOLECYSTECTOMY: SHX55

## 2013-12-17 HISTORY — DX: Calculus of gallbladder with acute cholecystitis without obstruction: K80.00

## 2013-12-17 LAB — COMPREHENSIVE METABOLIC PANEL
ALT: 122 U/L — AB (ref 0–35)
AST: 88 U/L — AB (ref 0–37)
Albumin: 4 g/dL (ref 3.5–5.2)
Alkaline Phosphatase: 189 U/L — ABNORMAL HIGH (ref 39–117)
Anion gap: 14 (ref 5–15)
BUN: 11 mg/dL (ref 6–23)
CALCIUM: 9.9 mg/dL (ref 8.4–10.5)
CHLORIDE: 104 meq/L (ref 96–112)
CO2: 24 mEq/L (ref 19–32)
CREATININE: 0.83 mg/dL (ref 0.50–1.10)
GFR calc Af Amer: 86 mL/min — ABNORMAL LOW (ref >90)
GFR calc non Af Amer: 75 mL/min — ABNORMAL LOW (ref >90)
Glucose, Bld: 90 mg/dL (ref 70–99)
Potassium: 4.2 mEq/L (ref 3.7–5.3)
SODIUM: 142 meq/L (ref 137–147)
Total Bilirubin: 0.5 mg/dL (ref 0.3–1.2)
Total Protein: 7.7 g/dL (ref 6.0–8.3)

## 2013-12-17 LAB — URINALYSIS, ROUTINE W REFLEX MICROSCOPIC
Bilirubin Urine: NEGATIVE
Glucose, UA: NEGATIVE mg/dL
Hgb urine dipstick: NEGATIVE
Ketones, ur: NEGATIVE mg/dL
LEUKOCYTES UA: NEGATIVE
NITRITE: NEGATIVE
PH: 6.5 (ref 5.0–8.0)
Protein, ur: NEGATIVE mg/dL
Specific Gravity, Urine: 1.013 (ref 1.005–1.030)
Urobilinogen, UA: 0.2 mg/dL (ref 0.0–1.0)

## 2013-12-17 LAB — CBC WITH DIFFERENTIAL/PLATELET
BASOS ABS: 0 10*3/uL (ref 0.0–0.1)
Basophils Relative: 1 % (ref 0–1)
Eosinophils Absolute: 0.1 10*3/uL (ref 0.0–0.7)
Eosinophils Relative: 4 % (ref 0–5)
HEMATOCRIT: 41.7 % (ref 36.0–46.0)
Hemoglobin: 13.6 g/dL (ref 12.0–15.0)
LYMPHS PCT: 34 % (ref 12–46)
Lymphs Abs: 1.1 10*3/uL (ref 0.7–4.0)
MCH: 30.4 pg (ref 26.0–34.0)
MCHC: 32.6 g/dL (ref 30.0–36.0)
MCV: 93.3 fL (ref 78.0–100.0)
Monocytes Absolute: 0.3 10*3/uL (ref 0.1–1.0)
Monocytes Relative: 10 % (ref 3–12)
NEUTROS ABS: 1.6 10*3/uL — AB (ref 1.7–7.7)
Neutrophils Relative %: 51 % (ref 43–77)
PLATELETS: 203 10*3/uL (ref 150–400)
RBC: 4.47 MIL/uL (ref 3.87–5.11)
RDW: 13.1 % (ref 11.5–15.5)
WBC: 3.2 10*3/uL — AB (ref 4.0–10.5)

## 2013-12-17 LAB — LIPASE, BLOOD: LIPASE: 21 U/L (ref 11–59)

## 2013-12-17 SURGERY — LAPAROSCOPIC CHOLECYSTECTOMY WITH INTRAOPERATIVE CHOLANGIOGRAM
Anesthesia: General | Site: Abdomen

## 2013-12-17 MED ORDER — ACETAMINOPHEN 650 MG RE SUPP
650.0000 mg | Freq: Four times a day (QID) | RECTAL | Status: DC | PRN
Start: 2013-12-17 — End: 2013-12-18

## 2013-12-17 MED ORDER — ONDANSETRON HCL 4 MG/2ML IJ SOLN
4.0000 mg | Freq: Once | INTRAMUSCULAR | Status: AC
  Administered 2013-12-17: 4 mg via INTRAVENOUS
  Filled 2013-12-17: qty 2

## 2013-12-17 MED ORDER — HYDROMORPHONE HCL 1 MG/ML IJ SOLN
1.0000 mg | INTRAMUSCULAR | Status: DC | PRN
Start: 2013-12-17 — End: 2013-12-18

## 2013-12-17 MED ORDER — LACTATED RINGERS IV SOLN
INTRAVENOUS | Status: DC | PRN
  Administered 2013-12-17 (×2): via INTRAVENOUS

## 2013-12-17 MED ORDER — OXYCODONE-ACETAMINOPHEN 5-325 MG PO TABS
1.0000 | ORAL_TABLET | ORAL | Status: DC | PRN
Start: 2013-12-17 — End: 2013-12-18
  Administered 2013-12-17: 2 via ORAL
  Filled 2013-12-17: qty 2

## 2013-12-17 MED ORDER — ASPIRIN 81 MG PO TABS: 81.0000 mg | ORAL_TABLET | Freq: Every day | ORAL | Status: DC

## 2013-12-17 MED ORDER — ONDANSETRON HCL 4 MG/2ML IJ SOLN
4.0000 mg | Freq: Four times a day (QID) | INTRAMUSCULAR | Status: DC | PRN
  Administered 2013-12-18: 4 mg via INTRAVENOUS
  Filled 2013-12-17: qty 2

## 2013-12-17 MED ORDER — GLYCOPYRROLATE 0.2 MG/ML IJ SOLN
INTRAMUSCULAR | Status: AC
  Filled 2013-12-17: qty 2

## 2013-12-17 MED ORDER — SUCCINYLCHOLINE CHLORIDE 20 MG/ML IJ SOLN
INTRAMUSCULAR | Status: AC
  Filled 2013-12-17: qty 1

## 2013-12-17 MED ORDER — SODIUM CHLORIDE 0.9 % IR SOLN
Status: DC | PRN
  Administered 2013-12-17: 1000 mL

## 2013-12-17 MED ORDER — CEFAZOLIN SODIUM-DEXTROSE 2-3 GM-% IV SOLR
2.0000 g | Freq: Three times a day (TID) | INTRAVENOUS | Status: AC
  Administered 2013-12-18: 2 g via INTRAVENOUS
  Filled 2013-12-17: qty 50

## 2013-12-17 MED ORDER — EPHEDRINE SULFATE 50 MG/ML IJ SOLN
INTRAMUSCULAR | Status: DC | PRN
  Administered 2013-12-17: 5 mg via INTRAVENOUS
  Administered 2013-12-17: 10 mg via INTRAVENOUS

## 2013-12-17 MED ORDER — HYDROCODONE-ACETAMINOPHEN 5-325 MG PO TABS: 1.0000 | ORAL_TABLET | ORAL | Status: DC | PRN

## 2013-12-17 MED ORDER — HYDROMORPHONE HCL 1 MG/ML IJ SOLN
INTRAMUSCULAR | Status: AC
  Filled 2013-12-17: qty 1

## 2013-12-17 MED ORDER — DEXTROSE-NACL 5-0.9 % IV SOLN: INTRAVENOUS | Status: DC

## 2013-12-17 MED ORDER — DEXAMETHASONE SODIUM PHOSPHATE 4 MG/ML IJ SOLN
INTRAMUSCULAR | Status: DC | PRN
  Administered 2013-12-17: 4 mg via INTRAVENOUS

## 2013-12-17 MED ORDER — FENTANYL CITRATE 0.05 MG/ML IJ SOLN
INTRAMUSCULAR | Status: AC
  Filled 2013-12-17: qty 5

## 2013-12-17 MED ORDER — ARTIFICIAL TEARS OP OINT
TOPICAL_OINTMENT | OPHTHALMIC | Status: DC | PRN
  Administered 2013-12-17: 1 via OPHTHALMIC

## 2013-12-17 MED ORDER — EPHEDRINE SULFATE 50 MG/ML IJ SOLN
INTRAMUSCULAR | Status: AC
  Filled 2013-12-17: qty 1

## 2013-12-17 MED ORDER — ENOXAPARIN SODIUM 40 MG/0.4ML ~~LOC~~ SOLN
40.0000 mg | SUBCUTANEOUS | Status: DC
  Filled 2013-12-17: qty 0.4

## 2013-12-17 MED ORDER — ONDANSETRON HCL 4 MG/2ML IJ SOLN
INTRAMUSCULAR | Status: DC | PRN
  Administered 2013-12-17: 4 mg via INTRAVENOUS

## 2013-12-17 MED ORDER — FLUTICASONE PROPIONATE 50 MCG/ACT NA SUSP
2.0000 | Freq: Every day | NASAL | Status: DC
  Administered 2013-12-18: 2 via NASAL
  Filled 2013-12-17: qty 16

## 2013-12-17 MED ORDER — STERILE WATER FOR INJECTION IJ SOLN
INTRAMUSCULAR | Status: AC
  Filled 2013-12-17: qty 10

## 2013-12-17 MED ORDER — GLYCOPYRROLATE 0.2 MG/ML IJ SOLN
INTRAMUSCULAR | Status: DC | PRN
  Administered 2013-12-17: 0.4 mg via INTRAVENOUS

## 2013-12-17 MED ORDER — PROPOFOL 10 MG/ML IV BOLUS
INTRAVENOUS | Status: DC | PRN
  Administered 2013-12-17: 170 mg via INTRAVENOUS

## 2013-12-17 MED ORDER — MIDAZOLAM HCL 5 MG/5ML IJ SOLN
INTRAMUSCULAR | Status: DC | PRN
  Administered 2013-12-17 (×2): 1 mg via INTRAVENOUS

## 2013-12-17 MED ORDER — ARTIFICIAL TEARS OP OINT
TOPICAL_OINTMENT | OPHTHALMIC | Status: AC
  Filled 2013-12-17: qty 3.5

## 2013-12-17 MED ORDER — ONDANSETRON HCL 4 MG PO TABS: 4.0000 mg | ORAL_TABLET | Freq: Four times a day (QID) | ORAL | Status: DC | PRN

## 2013-12-17 MED ORDER — NEOSTIGMINE METHYLSULFATE 10 MG/10ML IV SOLN
INTRAVENOUS | Status: DC | PRN
  Administered 2013-12-17: 3 mg via INTRAVENOUS

## 2013-12-17 MED ORDER — EZETIMIBE 10 MG PO TABS
10.0000 mg | ORAL_TABLET | Freq: Every day | ORAL | Status: DC
  Administered 2013-12-17 – 2013-12-18 (×2): 10 mg via ORAL
  Filled 2013-12-17 (×2): qty 1

## 2013-12-17 MED ORDER — HYDROMORPHONE HCL 1 MG/ML IJ SOLN
0.2500 mg | INTRAMUSCULAR | Status: DC | PRN
  Administered 2013-12-17 (×2): 0.5 mg via INTRAVENOUS

## 2013-12-17 MED ORDER — PHENYLEPHRINE 40 MCG/ML (10ML) SYRINGE FOR IV PUSH (FOR BLOOD PRESSURE SUPPORT)
PREFILLED_SYRINGE | INTRAVENOUS | Status: AC
  Filled 2013-12-17: qty 10

## 2013-12-17 MED ORDER — ENOXAPARIN SODIUM 40 MG/0.4ML ~~LOC~~ SOLN: 40.0000 mg | SUBCUTANEOUS | Status: DC

## 2013-12-17 MED ORDER — ALPRAZOLAM 0.5 MG PO TABS
0.5000 mg | ORAL_TABLET | Freq: Two times a day (BID) | ORAL | Status: DC
  Administered 2013-12-17: 1 mg via ORAL
  Filled 2013-12-17 (×2): qty 2

## 2013-12-17 MED ORDER — ALBUTEROL SULFATE (2.5 MG/3ML) 0.083% IN NEBU: 2.5000 mg | INHALATION_SOLUTION | Freq: Four times a day (QID) | RESPIRATORY_TRACT | Status: DC | PRN

## 2013-12-17 MED ORDER — DEXTROSE 5 % IV SOLN: 2.0000 g | Freq: Once | INTRAVENOUS | Status: DC

## 2013-12-17 MED ORDER — SCOPOLAMINE 1 MG/3DAYS TD PT72
MEDICATED_PATCH | TRANSDERMAL | Status: AC
  Administered 2013-12-17: 1 via TRANSDERMAL
  Filled 2013-12-17: qty 1

## 2013-12-17 MED ORDER — FENTANYL CITRATE 0.05 MG/ML IJ SOLN
INTRAMUSCULAR | Status: DC | PRN
  Administered 2013-12-17 (×5): 50 ug via INTRAVENOUS

## 2013-12-17 MED ORDER — ALPRAZOLAM 0.5 MG PO TABS: 0.5000 mg | ORAL_TABLET | Freq: Two times a day (BID) | ORAL | Status: DC | PRN

## 2013-12-17 MED ORDER — MIDAZOLAM HCL 2 MG/2ML IJ SOLN
INTRAMUSCULAR | Status: AC
  Filled 2013-12-17: qty 2

## 2013-12-17 MED ORDER — GLYCOPYRROLATE 0.2 MG/ML IJ SOLN
INTRAMUSCULAR | Status: AC
  Filled 2013-12-17: qty 3

## 2013-12-17 MED ORDER — PHENYLEPHRINE HCL 10 MG/ML IJ SOLN
INTRAMUSCULAR | Status: DC | PRN
  Administered 2013-12-17: 80 ug via INTRAVENOUS

## 2013-12-17 MED ORDER — DEXAMETHASONE SODIUM PHOSPHATE 4 MG/ML IJ SOLN
INTRAMUSCULAR | Status: AC
  Filled 2013-12-17: qty 1

## 2013-12-17 MED ORDER — ACETAMINOPHEN 325 MG PO TABS: 650.0000 mg | ORAL_TABLET | Freq: Four times a day (QID) | ORAL | Status: DC | PRN

## 2013-12-17 MED ORDER — MORPHINE SULFATE 2 MG/ML IJ SOLN: 2.0000 mg | INTRAMUSCULAR | Status: DC | PRN

## 2013-12-17 MED ORDER — LACTATED RINGERS IV SOLN: INTRAVENOUS | Status: DC

## 2013-12-17 MED ORDER — ROCURONIUM BROMIDE 100 MG/10ML IV SOLN
INTRAVENOUS | Status: DC | PRN
  Administered 2013-12-17: 30 mg via INTRAVENOUS

## 2013-12-17 MED ORDER — SODIUM CHLORIDE 0.9 % IV BOLUS (SEPSIS)
1000.0000 mL | Freq: Once | INTRAVENOUS | Status: AC
  Administered 2013-12-17: 1000 mL via INTRAVENOUS

## 2013-12-17 MED ORDER — BUPIVACAINE-EPINEPHRINE (PF) 0.5% -1:200000 IJ SOLN
INTRAMUSCULAR | Status: AC
  Filled 2013-12-17: qty 30

## 2013-12-17 MED ORDER — PANTOPRAZOLE SODIUM 40 MG PO TBEC
40.0000 mg | DELAYED_RELEASE_TABLET | Freq: Every day | ORAL | Status: DC
  Administered 2013-12-17 – 2013-12-18 (×2): 40 mg via ORAL
  Filled 2013-12-17 (×2): qty 1

## 2013-12-17 MED ORDER — PANTOPRAZOLE SODIUM 40 MG PO TBEC: 40.0000 mg | DELAYED_RELEASE_TABLET | Freq: Every day | ORAL | Status: DC

## 2013-12-17 MED ORDER — CEFTRIAXONE SODIUM 2 G IJ SOLR
2.0000 g | INTRAMUSCULAR | Status: AC
  Administered 2013-12-17: 2 g via INTRAVENOUS
  Filled 2013-12-17: qty 2

## 2013-12-17 MED ORDER — NEOSTIGMINE METHYLSULFATE 10 MG/10ML IV SOLN
INTRAVENOUS | Status: AC
  Filled 2013-12-17: qty 1

## 2013-12-17 MED ORDER — SUCCINYLCHOLINE CHLORIDE 20 MG/ML IJ SOLN
INTRAMUSCULAR | Status: DC | PRN
Start: 2013-12-17 — End: 2013-12-17
  Administered 2013-12-17: 100 mg via INTRAVENOUS

## 2013-12-17 MED ORDER — 0.9 % SODIUM CHLORIDE (POUR BTL) OPTIME
TOPICAL | Status: DC | PRN
  Administered 2013-12-17: 1000 mL

## 2013-12-17 MED ORDER — KCL IN DEXTROSE-NACL 20-5-0.9 MEQ/L-%-% IV SOLN: INTRAVENOUS | Status: DC

## 2013-12-17 MED ORDER — ONDANSETRON HCL 4 MG/2ML IJ SOLN: 4.0000 mg | Freq: Four times a day (QID) | INTRAMUSCULAR | Status: DC | PRN

## 2013-12-17 MED ORDER — SODIUM CHLORIDE 0.9 % IV SOLN
INTRAVENOUS | Status: DC | PRN
  Administered 2013-12-17: 17:00:00

## 2013-12-17 MED ORDER — POTASSIUM CHLORIDE IN NACL 20-0.9 MEQ/L-% IV SOLN
INTRAVENOUS | Status: DC
  Administered 2013-12-17 – 2013-12-18 (×2): via INTRAVENOUS
  Filled 2013-12-17 (×2): qty 1000

## 2013-12-17 MED ORDER — LIDOCAINE HCL (CARDIAC) 20 MG/ML IV SOLN
INTRAVENOUS | Status: DC | PRN
  Administered 2013-12-17: 80 mg via INTRAVENOUS

## 2013-12-17 MED ORDER — BUPIVACAINE-EPINEPHRINE 0.5% -1:200000 IJ SOLN
INTRAMUSCULAR | Status: DC | PRN
  Administered 2013-12-17: 30 mL

## 2013-12-17 MED ORDER — MORPHINE SULFATE 4 MG/ML IJ SOLN
4.0000 mg | Freq: Once | INTRAMUSCULAR | Status: AC
  Administered 2013-12-17: 4 mg via INTRAVENOUS
  Filled 2013-12-17: qty 1

## 2013-12-17 SURGICAL SUPPLY — 44 items
ADH SKN CLS APL DERMABOND .7 (GAUZE/BANDAGES/DRESSINGS) ×1
APPLIER CLIP ROT 10 11.4 M/L (STAPLE) ×2
APR CLP MED LRG 11.4X10 (STAPLE) ×1
BAG SPEC RTRVL LRG 6X4 10 (ENDOMECHANICALS) ×1
BLADE SURG ROTATE 9660 (MISCELLANEOUS) IMPLANT
CANISTER SUCTION 2500CC (MISCELLANEOUS) ×2 IMPLANT
CHLORAPREP W/TINT 26ML (MISCELLANEOUS) ×2 IMPLANT
CLIP APPLIE ROT 10 11.4 M/L (STAPLE) ×1 IMPLANT
COVER MAYO STAND STRL (DRAPES) ×2 IMPLANT
COVER SURGICAL LIGHT HANDLE (MISCELLANEOUS) ×2 IMPLANT
DERMABOND ADVANCED (GAUZE/BANDAGES/DRESSINGS) ×1
DERMABOND ADVANCED .7 DNX12 (GAUZE/BANDAGES/DRESSINGS) ×1 IMPLANT
DRAPE C-ARM 42X72 X-RAY (DRAPES) ×2 IMPLANT
DRAPE LAPAROSCOPIC ABDOMINAL (DRAPES) ×2 IMPLANT
ELECT REM PT RETURN 9FT ADLT (ELECTROSURGICAL) ×2
ELECTRODE REM PT RTRN 9FT ADLT (ELECTROSURGICAL) ×1 IMPLANT
GLOVE BIO SURGEON STRL SZ8 (GLOVE) ×1 IMPLANT
GLOVE BIOGEL PI IND STRL 7.0 (GLOVE) IMPLANT
GLOVE BIOGEL PI IND STRL 8 (GLOVE) IMPLANT
GLOVE BIOGEL PI INDICATOR 7.0 (GLOVE) ×1
GLOVE BIOGEL PI INDICATOR 8 (GLOVE) ×1
GLOVE EUDERMIC 7 POWDERFREE (GLOVE) ×2 IMPLANT
GLOVE SURG SS PI 7.0 STRL IVOR (GLOVE) ×1 IMPLANT
GOWN STRL REUS W/ TWL LRG LVL3 (GOWN DISPOSABLE) ×3 IMPLANT
GOWN STRL REUS W/ TWL XL LVL3 (GOWN DISPOSABLE) ×1 IMPLANT
GOWN STRL REUS W/TWL LRG LVL3 (GOWN DISPOSABLE) ×2
GOWN STRL REUS W/TWL XL LVL3 (GOWN DISPOSABLE) ×2
KIT BASIN OR (CUSTOM PROCEDURE TRAY) ×2 IMPLANT
KIT ROOM TURNOVER OR (KITS) ×2 IMPLANT
NS IRRIG 1000ML POUR BTL (IV SOLUTION) ×2 IMPLANT
PAD ARMBOARD 7.5X6 YLW CONV (MISCELLANEOUS) ×2 IMPLANT
POUCH SPECIMEN RETRIEVAL 10MM (ENDOMECHANICALS) ×2 IMPLANT
SCISSORS LAP 5X35 DISP (ENDOMECHANICALS) ×2 IMPLANT
SET CHOLANGIOGRAPH 5 50 .035 (SET/KITS/TRAYS/PACK) ×2 IMPLANT
SET IRRIG TUBING LAPAROSCOPIC (IRRIGATION / IRRIGATOR) ×2 IMPLANT
SLEEVE ENDOPATH XCEL 5M (ENDOMECHANICALS) ×2 IMPLANT
SPECIMEN JAR SMALL (MISCELLANEOUS) ×2 IMPLANT
SUT MNCRL AB 4-0 PS2 18 (SUTURE) ×2 IMPLANT
TOWEL OR 17X26 10 PK STRL BLUE (TOWEL DISPOSABLE) ×2 IMPLANT
TRAY LAPAROSCOPIC (CUSTOM PROCEDURE TRAY) ×2 IMPLANT
TROCAR XCEL BLUNT TIP 100MML (ENDOMECHANICALS) ×2 IMPLANT
TROCAR XCEL NON-BLD 11X100MML (ENDOMECHANICALS) ×2 IMPLANT
TROCAR XCEL NON-BLD 5MMX100MML (ENDOMECHANICALS) ×2 IMPLANT
TUBING INSUFFLATION (TUBING) ×2 IMPLANT

## 2013-12-17 NOTE — ED Notes (Signed)
Patient back from US.

## 2013-12-17 NOTE — Anesthesia Procedure Notes (Signed)
Procedure Name: Intubation Date/Time: 12/17/2013 3:43 PM Performed by: Susa Loffler Pre-anesthesia Checklist: Patient identified, Timeout performed, Emergency Drugs available, Suction available and Patient being monitored Patient Re-evaluated:Patient Re-evaluated prior to inductionOxygen Delivery Method: Circle system utilized Preoxygenation: Pre-oxygenation with 100% oxygen Intubation Type: IV induction and Rapid sequence Laryngoscope Size: Mac and 3 Grade View: Grade I Tube type: Oral Tube size: 7.0 mm Number of attempts: 1 Airway Equipment and Method: Stylet Placement Confirmation: ETT inserted through vocal cords under direct vision,  positive ETCO2 and breath sounds checked- equal and bilateral Secured at: 22 cm Tube secured with: Tape Dental Injury: Teeth and Oropharynx as per pre-operative assessment

## 2013-12-17 NOTE — ED Notes (Signed)
General Surgery at bedside.

## 2013-12-17 NOTE — ED Notes (Signed)
abd pain n/v/d for  For months she states she has had issues and has had colon resection in the past. Had a ct  Oct 2

## 2013-12-17 NOTE — Transfer of Care (Signed)
Immediate Anesthesia Transfer of Care Note  Patient: Teresa Vasquez  Procedure(s) Performed: Procedure(s): LAPAROSCOPIC CHOLECYSTECTOMY WITH INTRAOPERATIVE CHOLANGIOGRAM (N/A)  Patient Location: PACU  Anesthesia Type:General  Level of Consciousness: awake, alert  and oriented  Airway & Oxygen Therapy: Patient Spontanous Breathing and Patient connected to nasal cannula oxygen  Post-op Assessment: Report given to PACU RN and Post -op Vital signs reviewed and stable  Post vital signs: Reviewed and stable  Complications: No apparent anesthesia complications

## 2013-12-17 NOTE — H&P (Signed)
General surgery attending:  I have interviewed and examined this patient at this hour. Agree with assessment above.  She has been having biliary colic for 3 months. It was more than she can stand today and she came to the emergency room. Ultrasound shows gallstones, but no inflammatory change or biliary dilatation. AST and ALT are elevated, otherwise LFTs normal.lipase normal. Exam reveals minimal RUQ  tenderness and no mass. She has some laparoscopic scars and an extraction site left lower quadrant from previous sigmoid colectomy.  She would like to come in the hospital and proceed with cholecystectomy at this time. We are going to try to do this this afternoon  I discussed the indications, details, techniques, and numerous risk of laparoscopic cholecystectomy with the patient and her husband. They're aware of the bleed the risk of bleeding, infection, bile leak, injury to adjacent organs, conversion to open laparotomy, wound hernia, and other unforeseen problems. She understands all these issues. At this time all of her questions are answered. She agrees with this plan.   Edsel Petrin. Dalbert Batman, M.D., [facility] General and Minimally invasive Surgery Breast and Colorectal Surgery

## 2013-12-17 NOTE — Op Note (Signed)
Patient Name:           Teresa Vasquez   Date of Surgery:        12/17/2013  Pre op Diagnosis:      Acute and chronic cholecystitis with cholelithiasis  Post op Diagnosis:    same  Procedure:                 Laparoscopic cholecystectomy with cholangiogram  Surgeon:                     Edsel Petrin. Dalbert Batman, M.D., FACS  Assistant:                      Georganna Skeans, M.D.  Operative Indications:   This is a 63 year old Caucasian female with a three-month history of intermittent biliary colic, fairly classic in pattern. Pain was severe today she came to the emergency room where ultrasound shows gallstones. AST and ALT are elevated. She has no abdominal tenderness although not severe. She has a history of a sigmoid colectomy done laparoscopically. She is brought to the operating room for cholecystectomy  Operative Findings:       The gallbladder was acutely and chronically inflamed and discolored. The cholangiogram was normal, showing normal intrahepatic and extrahepatic biliary anatomy, no filling defect, and no obstruction with good flow of contrast into the duodenum. The stomach, duodenum, small intestine, and large intestine were grossly normal. There were minimal adhesions in the pelvis from the prior colon surgery.  Procedure in Detail:          Following the induction of general endotracheal anesthesia the patient's abdomen was prepped and draped in sterile fashion. Antibiotics had been given preoperatively. Surgical time out was performed. 0.5% Marcaine with epinephrine was used as local infiltration anesthetic.     A vertical incision was made at the lower rim of the umbilicus through a previous  laparoscopy scar. The fascia was incised in the midline. The abdominal cavity was entered under direct vision. An 11 mm Hassan trocar was inserted and secured with a pursestring suture of 0 Vicryl. Pneumoperitoneum was treated. Video camera was inserted.  An 11 mm trocar was placed in the subxiphoid  region and two 5 mm trocars placed in the right upper quadrant. The gallbladder fundus was elevated. Adhesions were taken down. The infundibulum was retracted laterally. The peritoneum was dissected away from the neck of the gallbladder exposing the cystic duct and cystic artery. A cholangiogram catheter was inserted into the cystic duct and a cholangiogram was obtained using the C-arm. The cholangiogram was normal as described above. The cholangiocatheter was removed. The cystic duct and the cystic artery were secured with multiple metal clips and divided. The gallbladder was dissected from its bed with electrocautery, placed in a specimen bag and removed.      The operative field was irrigated and inspected. It was very clean. There is no bleeding or bile leak. Irrigation fluid was removed. The trocars were removed. The pneumoperitoneum was released. The fascia at the umbilicus was closed with 0 Vicryl sutures and the skin closed with subcuticular stitches of 4-0 Monocryl and dermabond. The patient tolerated the procedure well was taken to PACU in stable condition. EBL 10 cc. Counts correct. Complications none.     Edsel Petrin. Dalbert Batman, M.D., FACS General and Minimally Invasive Surgery Breast and Colorectal Surgery  12/17/2013 4:58 PM

## 2013-12-17 NOTE — Anesthesia Preprocedure Evaluation (Addendum)
Anesthesia Evaluation  Patient identified by MRN, date of birth, ID band Patient awake    Reviewed: Allergy & Precautions, H&P , NPO status   Airway Mallampati: II TM Distance: >3 FB Neck ROM: Full    Dental  (+) Teeth Intact, Dental Advisory Given   Pulmonary  breath sounds clear to auscultation        Cardiovascular hypertension, Rhythm:Regular Rate:Normal     Neuro/Psych Anxiety Depression    GI/Hepatic   Endo/Other  Morbid obesity  Renal/GU      Musculoskeletal   Abdominal   Peds  Hematology   Anesthesia Other Findings   Reproductive/Obstetrics                         Anesthesia Physical Anesthesia Plan  ASA: II  Anesthesia Plan: General   Post-op Pain Management:    Induction: Intravenous and Rapid sequence  Airway Management Planned: Oral ETT  Additional Equipment:   Intra-op Plan:   Post-operative Plan: Extubation in OR  Informed Consent: I have reviewed the patients History and Physical, chart, labs and discussed the procedure including the risks, benefits and alternatives for the proposed anesthesia with the patient or authorized representative who has indicated his/her understanding and acceptance.   Dental advisory given  Plan Discussed with: Anesthesiologist, Surgeon and CRNA  Anesthesia Plan Comments:        Anesthesia Quick Evaluation

## 2013-12-17 NOTE — H&P (Signed)
Chief Complaint: abdominal pain HPI: Teresa Vasquez is a 63 year old female with a history of IBS, diverticulitis s/p laparoscopic sigmoid colectomy by Dr. Excell Seltzer in 2012, depression, HLD who presents with abdominal pain.  Duration of symptoms is 3 months.  Onset was gradual.  Coarse is worsening.  Time pattern is intermittent.  Characterized as a "bad toothache."  Moderate in severity.  Aggravated by any oral intake.  No alleviating factors.  Modifying factors include; antiemetics, tylenol with codeine.  Location is RUQ with radiation to the back.  Associated with nausea and vomiting.  She denies fever, chills or sweats.  Denies melena or hematochezia, diarrhea, c/o constipation.  Denies recent weight loss.  She has not eaten since last night, had a few sips of water 3-4 hour ago.  She has been seen by Dr. Idell Pickles office several times for the above complaints.  She presented today due to worsening symptoms since last Sunday.  She has had little PO intake.  She denies fever, chills or sweats.  Her work up includes an [place] of abdomen which shows stones, no pericholecystic fluid or gallbladder wall thickening.  White count is normal.  AST and ALT are elevated with a normal bilirubin.  We have therefore been asked to evaluate the patient.  She denies chest pains or DOE, able to do ADLs without any difficulties.    Past Medical History  Diagnosis Date  . Anxiety and depression   . Diverticulitis   . Irritable bowel syndrome   . Depression   . Hypertension   . Hyperlipidemia     Past Surgical History  Procedure Laterality Date  . Tonsillectomy    . Vaginal hysterectomy    . Colon surgery      Family History  Problem Relation Age of Onset  . Heart disease Father   . Heart disease Mother   . Hypertension Mother   . Osteoporosis Mother   . Heart attack Brother   . Heart disease Brother   . Irritable bowel syndrome Sister    Social History:  reports that she has never smoked. She has never  used smokeless tobacco. She reports that she drinks alcohol. She reports that she does not use illicit drugs.  Allergies:  Allergies  Allergen Reactions  . Atorvastatin   . Levaquin [Levofloxacin In D5w] Nausea Only   Medication: Prior to Admission medications   Medication Sig Start Date End Date Taking? Authorizing Provider  acetaminophen-codeine (TYLENOL #3) 300-30 MG per tablet Take 1 tablet by mouth every 4 (four) hours as needed for moderate pain. 11/23/13   Vicie Mutters, PA-C  albuterol (PROVENTIL HFA;VENTOLIN HFA) 108 (90 BASE) MCG/ACT inhaler Inhale 2 puffs into the lungs every 6 (six) hours as needed for wheezing or shortness of breath. 06/10/13   Vicie Mutters, PA-C  aspirin 81 MG tablet Take 81 mg by mouth daily.      Historical Provider, MD  Cholecalciferol (VITAMIN D PO) Take 5,000 Units by mouth. daily    Historical Provider, MD  ezetimibe (ZETIA) 10 MG tablet Take 1 tablet (10 mg total) by mouth daily. 03/18/13 03/18/14  Unk Pinto, MD     (Not in a hospital admission)  Results for orders placed during the hospital encounter of 12/17/13 (from the past 48 hour(s))  CBC WITH DIFFERENTIAL     Status: Abnormal   Collection Time    12/17/13 11:54 AM      Result Value Ref Range   WBC 3.2 (*) 4.0 - 10.5  K/uL   RBC 4.47  3.87 - 5.11 MIL/uL   Hemoglobin 13.6  12.0 - 15.0 g/dL   HCT 41.7  36.0 - 46.0 %   MCV 93.3  78.0 - 100.0 fL   MCH 30.4  26.0 - 34.0 pg   MCHC 32.6  30.0 - 36.0 g/dL   RDW 13.1  11.5 - 15.5 %   Platelets 203  150 - 400 K/uL   Neutrophils Relative % 51  43 - 77 %   Neutro Abs 1.6 (*) 1.7 - 7.7 K/uL   Lymphocytes Relative 34  12 - 46 %   Lymphs Abs 1.1  0.7 - 4.0 K/uL   Monocytes Relative 10  3 - 12 %   Monocytes Absolute 0.3  0.1 - 1.0 K/uL   Eosinophils Relative 4  0 - 5 %   Eosinophils Absolute 0.1  0.0 - 0.7 K/uL   Basophils Relative 1  0 - 1 %   Basophils Absolute 0.0  0.0 - 0.1 K/uL  COMPREHENSIVE METABOLIC PANEL     Status: Abnormal    Collection Time    12/17/13 11:54 AM      Result Value Ref Range   Sodium 142  137 - 147 mEq/L   Potassium 4.2  3.7 - 5.3 mEq/L   Chloride 104  96 - 112 mEq/L   CO2 24  19 - 32 mEq/L   Glucose, Bld 90  70 - 99 mg/dL   BUN 11  6 - 23 mg/dL   Creatinine, Ser 0.83  0.50 - 1.10 mg/dL   Calcium 9.9  8.4 - 10.5 mg/dL   Total Protein 7.7  6.0 - 8.3 g/dL   Albumin 4.0  3.5 - 5.2 g/dL   AST 88 (*) 0 - 37 U/L   ALT 122 (*) 0 - 35 U/L   Alkaline Phosphatase 189 (*) 39 - 117 U/L   Total Bilirubin 0.5  0.3 - 1.2 mg/dL   GFR calc non Af Amer 75 (*) >90 mL/min   GFR calc Af Amer 86 (*) >90 mL/min   Comment: (NOTE)     The eGFR has been calculated using the CKD EPI equation.     This calculation has not been validated in all clinical situations.     eGFR's persistently <90 mL/min signify possible Chronic Kidney     Disease.   Anion gap 14  5 - 15  LIPASE, BLOOD     Status: None   Collection Time    12/17/13 11:54 AM      Result Value Ref Range   Lipase 21  11 - 59 U/L  URINALYSIS, ROUTINE W REFLEX MICROSCOPIC     Status: None   Collection Time    12/17/13 11:56 AM      Result Value Ref Range   Color, Urine YELLOW  YELLOW   APPearance CLEAR  CLEAR   Specific Gravity, Urine 1.013  1.005 - 1.030   pH 6.5  5.0 - 8.0   Glucose, UA NEGATIVE  NEGATIVE mg/dL   Hgb urine dipstick NEGATIVE  NEGATIVE   Bilirubin Urine NEGATIVE  NEGATIVE   Ketones, ur NEGATIVE  NEGATIVE mg/dL   Protein, ur NEGATIVE  NEGATIVE mg/dL   Urobilinogen, UA 0.2  0.0 - 1.0 mg/dL   Nitrite NEGATIVE  NEGATIVE   Leukocytes, UA NEGATIVE  NEGATIVE   Comment: MICROSCOPIC NOT DONE ON URINES WITH NEGATIVE PROTEIN, BLOOD, LEUKOCYTES, NITRITE, OR GLUCOSE <1000 mg/dL.   US Abdomen Complete  12/17/2013  CLINICAL DATA:  Initial evaluation for right-sided abdominal pain  EXAM: ULTRASOUND ABDOMEN COMPLETE  COMPARISON:  None.  FINDINGS: Gallbladder: There are mobile gallstones largest measuring 1.9 cm. There is no wall thickening or  Murphy's sign.  Common bile duct: Diameter: 3 mm  Liver: No focal lesion identified. Within normal limits in parenchymal echogenicity.  IVC: No abnormality visualized.  Pancreas: Visualized portion unremarkable.  Spleen: Size and appearance within normal limits.  Right Kidney: Length: 10 cm. Echogenicity within normal limits. No mass or hydronephrosis visualized.  Left Kidney: Length: 10 cm. Echogenicity within normal limits. No mass or hydronephrosis visualized.  Abdominal aorta: No aneurysm visualized.  Other findings: None.  IMPRESSION: Cholelithiasis   Electronically Signed   By: Skipper Cliche M.D.   On: 12/17/2013 13:39   Dg Abd Acute W/chest  12/17/2013   CLINICAL DATA:  Right-sided abdominal and lower back pain  EXAM: ACUTE ABDOMEN SERIES (ABDOMEN 2 VIEW & CHEST 1 VIEW)  COMPARISON:  Chest radiograph June 14, 2013; abdomen radiograph November 16, 2013  FINDINGS: PA chest: There is scarring in the left base. Elsewhere lungs are clear. Heart is upper normal in size with pulmonary vascularity within normal limits. No adenopathy.  Supine and upright abdomen: There is moderate stool throughout the colon. The bowel gas pattern is unremarkable. No obstruction or free air. There are clips in the upper pelvic region.  IMPRESSION: No edema or consolidation. Stable scarring left base. Bowel gas pattern unremarkable. No obstruction or free air apparent.   Electronically Signed   By: Lowella Grip M.D.   On: 12/17/2013 14:07    Review of Systems  All other systems reviewed and are negative.   Blood pressure 152/78, pulse 78, temperature 97.7 F (36.5 C), temperature source Oral, resp. rate 22, height _0  (1.702 m), weight 200 lb (90.719 kg), SpO2 97.00%. Physical Exam  Constitutional: She is oriented to person, place, and time. She appears well-developed and well-nourished. No distress.  Neck: Normal range of motion. Neck supple.  Cardiovascular: Normal rate, regular rhythm, normal heart sounds  and intact distal pulses.  Exam reveals no friction rub.   No murmur heard. Respiratory: Effort normal and breath sounds normal. No respiratory distress. She has no wheezes. She has no rales. She exhibits no tenderness.  GI: Soft. Bowel sounds are normal. She exhibits no distension and no mass. There is no rebound and no guarding.  TTP RUQ.  LLQ transverse scar, laparoscopic umbilical site scar  Musculoskeletal: Normal range of motion. She exhibits no edema and no tenderness.  Lymphadenopathy:    She has no cervical adenopathy.  Neurological: She is alert and oriented to person, place, and time.  Skin: Skin is warm and dry. No rash noted. She is not diaphoretic. No erythema. No pallor.  Psychiatric: She has a normal mood and affect. Her behavior is normal. Judgment and thought content normal.     Assessment/Plan Symptomatic cholelithiasis with possible early cholecystitis transaminitis hypokalemia -proceed with a laparoscopic cholecystectomy with IOC today -rocephin on call to OR -NPO -IVF with KCL -pain control -antiemetics -SCD -lovenox post op unless otherwise indicated by surgeon -continue home meds  Zabdi Mis ANP-BC 12/17/2013, 2:28 PM

## 2013-12-17 NOTE — ED Provider Notes (Signed)
CSN: [id number]     Arrival date & time 12/17/13  1029 History   First MD Initiated Contact with Patient 12/17/13 1109     Chief Complaint  Patient presents with  . Abdominal Pain     (Consider location/radiation/quality/duration/timing/severity/associated sxs/prior Treatment) HPI Comments: Patient presents with abdominal pain. She's had several month history of pain in the right side of her abdomen. She's had associated ongoing nausea with occasional vomiting. She's also had watery diarrhea. She reports watery diarrhea 3-4 times a day or whenever she tries to eat. She has a history of irritable bowel syndrome and diverticulitis. She states that the pain associated with these conditions is usually more in her left side. She denies any blood in her stool or blood in her emesis. She denies any known fevers. She denies any recent antibiotic usage. She does camp. She denies any other travel history.  She has been seen for these complaints by her primary care physician. She had a recent CT scan of her abdomen and pelvis on October 2 which was reported to her as no acute conditions. She's been referred to look our gastroenterology but cannot get an appointment until this upcoming Tuesday which is in 4 days. She came here today because her pain in her other symptoms have been worsening over last week.  Patient is a 63 y.o. female presenting with abdominal pain.  Abdominal Pain Associated symptoms: diarrhea, nausea and vomiting   Associated symptoms: no chest pain, no chills, no cough, no fatigue, no fever, no hematuria and no shortness of breath     Past Medical History  Diagnosis Date  . Anxiety and depression   . Diverticulitis   . Irritable bowel syndrome   . Depression   . Hypertension   . Hyperlipidemia    Past Surgical History  Procedure Laterality Date  . Tonsillectomy    . Vaginal hysterectomy    . Colon surgery     Family History  Problem Relation Age of Onset  . Heart disease  Father   . Heart disease Mother   . Hypertension Mother   . Osteoporosis Mother   . Heart attack Brother   . Heart disease Brother   . Irritable bowel syndrome Sister    History  Substance Use Topics  . Smoking status: Never Smoker   . Smokeless tobacco: Never Used  . Alcohol Use: Yes   OB History   Grav Para Term Preterm Abortions TAB SAB Ect Mult Living                 Review of Systems  Constitutional: Negative for fever, chills, diaphoresis and fatigue.  HENT: Negative for congestion, rhinorrhea and sneezing.   Eyes: Negative.   Respiratory: Negative for cough, chest tightness and shortness of breath.   Cardiovascular: Negative for chest pain and leg swelling.  Gastrointestinal: Positive for nausea, vomiting, abdominal pain and diarrhea. Negative for blood in stool.  Genitourinary: Negative for frequency, hematuria, flank pain and difficulty urinating.  Musculoskeletal: Negative for arthralgias and back pain.  Skin: Negative for rash.  Neurological: Negative for dizziness, speech difficulty, weakness, numbness and headaches.      Allergies  Atorvastatin and Levaquin  Home Medications   Prior to Admission medications   Medication Sig Start Date End Date Taking? Authorizing Provider  acetaminophen-codeine (TYLENOL #3) 300-30 MG per tablet Take 1 tablet by mouth every 4 (four) hours as needed for moderate pain. 11/23/13  Yes Vicie Mutters, PA-C  ALPRAZolam Duanne Moron) 1 MG  tablet Take 0.5-1 mg by mouth 2 (two) times daily.   Yes Historical Provider, MD  aspirin 81 MG tablet Take 81 mg by mouth daily.    Yes Historical Provider, MD  Cholecalciferol (VITAMIN D PO) Take 5,000 Units by mouth. daily   Yes Historical Provider, MD  ezetimibe (ZETIA) 10 MG tablet Take 1 tablet (10 mg total) by mouth daily. 03/18/13 03/18/14 Yes Unk Pinto, MD  fluticasone (FLONASE) 50 MCG/ACT nasal spray Place 2 sprays into both nostrils daily.   Yes Historical Provider, MD  omeprazole  (PRILOSEC) 40 MG capsule Take 40 mg by mouth daily.   Yes Historical Provider, MD  albuterol (PROVENTIL HFA;VENTOLIN HFA) 108 (90 BASE) MCG/ACT inhaler Inhale 2 puffs into the lungs every 6 (six) hours as needed for wheezing or shortness of breath. 06/10/13   Vicie Mutters, PA-C   BP 154/72  Pulse 75  Temp(Src) 97.7 F (36.5 C) (Oral)  Resp 16  Ht 5\' 7"  (1.702 m)  Wt 200 lb (90.719 kg)  BMI 31.32 kg/m2  SpO2 100% Physical Exam  Constitutional: She is oriented to person, place, and time. She appears well-developed and well-nourished.  HENT:  Head: Normocephalic and atraumatic.  Eyes: Pupils are equal, round, and reactive to light.  Neck: Normal range of motion. Neck supple.  Cardiovascular: Normal rate, regular rhythm and normal heart sounds.   Pulmonary/Chest: Effort normal and breath sounds normal. No respiratory distress. She has no wheezes. She has no rales. She exhibits no tenderness.  Abdominal: Soft. Bowel sounds are normal. There is tenderness (moderate tenderness to the right midabdomen). There is no rebound and no guarding.  Musculoskeletal: Normal range of motion. She exhibits no edema.  Lymphadenopathy:    She has no cervical adenopathy.  Neurological: She is alert and oriented to person, place, and time.  Skin: Skin is warm and dry. No rash noted.  Psychiatric: She has a normal mood and affect.    ED Course  Procedures (including critical care time) Labs Review Results for orders placed during the hospital encounter of 12/17/13  CBC WITH DIFFERENTIAL      Result Value Ref Range   WBC 3.2 (*) 4.0 - 10.5 K/uL   RBC 4.47  3.87 - 5.11 MIL/uL   Hemoglobin 13.6  12.0 - 15.0 g/dL   HCT 41.7  36.0 - 46.0 %   MCV 93.3  78.0 - 100.0 fL   MCH 30.4  26.0 - 34.0 pg   MCHC 32.6  30.0 - 36.0 g/dL   RDW 13.1  11.5 - 15.5 %   Platelets 203  150 - 400 K/uL   Neutrophils Relative % 51  43 - 77 %   Neutro Abs 1.6 (*) 1.7 - 7.7 K/uL   Lymphocytes Relative 34  12 - 46 %   Lymphs  Abs 1.1  0.7 - 4.0 K/uL   Monocytes Relative 10  3 - 12 %   Monocytes Absolute 0.3  0.1 - 1.0 K/uL   Eosinophils Relative 4  0 - 5 %   Eosinophils Absolute 0.1  0.0 - 0.7 K/uL   Basophils Relative 1  0 - 1 %   Basophils Absolute 0.0  0.0 - 0.1 K/uL  COMPREHENSIVE METABOLIC PANEL      Result Value Ref Range   Sodium 142  137 - 147 mEq/L   Potassium 4.2  3.7 - 5.3 mEq/L   Chloride 104  96 - 112 mEq/L   CO2 24  19 - 32 mEq/L  Glucose, Bld 90  70 - 99 mg/dL   BUN 11  6 - 23 mg/dL   Creatinine, Ser 0.83  0.50 - 1.10 mg/dL   Calcium 9.9  8.4 - 10.5 mg/dL   Total Protein 7.7  6.0 - 8.3 g/dL   Albumin 4.0  3.5 - 5.2 g/dL   AST 88 (*) 0 - 37 U/L   ALT 122 (*) 0 - 35 U/L   Alkaline Phosphatase 189 (*) 39 - 117 U/L   Total Bilirubin 0.5  0.3 - 1.2 mg/dL   GFR calc non Af Amer 75 (*) >90 mL/min   GFR calc Af Amer 86 (*) >90 mL/min   Anion gap 14  5 - 15  LIPASE, BLOOD      Result Value Ref Range   Lipase 21  11 - 59 U/L  URINALYSIS, ROUTINE W REFLEX MICROSCOPIC      Result Value Ref Range   Color, Urine YELLOW  YELLOW   APPearance CLEAR  CLEAR   Specific Gravity, Urine 1.013  1.005 - 1.030   pH 6.5  5.0 - 8.0   Glucose, UA NEGATIVE  NEGATIVE mg/dL   Hgb urine dipstick NEGATIVE  NEGATIVE   Bilirubin Urine NEGATIVE  NEGATIVE   Ketones, ur NEGATIVE  NEGATIVE mg/dL   Protein, ur NEGATIVE  NEGATIVE mg/dL   Urobilinogen, UA 0.2  0.0 - 1.0 mg/dL   Nitrite NEGATIVE  NEGATIVE   Leukocytes, UA NEGATIVE  NEGATIVE   No results found.   Imaging Review US Abdomen Complete  12/17/2013   CLINICAL DATA:  Initial evaluation for right-sided abdominal pain  EXAM: ULTRASOUND ABDOMEN COMPLETE  COMPARISON:  None.  FINDINGS: Gallbladder: There are mobile gallstones largest measuring 1.9 cm. There is no wall thickening or Murphy's sign.  Common bile duct: Diameter: 3 mm  Liver: No focal lesion identified. Within normal limits in parenchymal echogenicity.  IVC: No abnormality visualized.  Pancreas:  Visualized portion unremarkable.  Spleen: Size and appearance within normal limits.  Right Kidney: Length: 10 cm. Echogenicity within normal limits. No mass or hydronephrosis visualized.  Left Kidney: Length: 10 cm. Echogenicity within normal limits. No mass or hydronephrosis visualized.  Abdominal aorta: No aneurysm visualized.  Other findings: None.  IMPRESSION: Cholelithiasis   Electronically Signed   By: Skipper Cliche M.D.   On: 12/17/2013 13:39   Dg Abd Acute W/chest  12/17/2013   CLINICAL DATA:  Right-sided abdominal and lower back pain  EXAM: ACUTE ABDOMEN SERIES (ABDOMEN 2 VIEW & CHEST 1 VIEW)  COMPARISON:  Chest radiograph June 14, 2013; abdomen radiograph November 16, 2013  FINDINGS: PA chest: There is scarring in the left base. Elsewhere lungs are clear. Heart is upper normal in size with pulmonary vascularity within normal limits. No adenopathy.  Supine and upright abdomen: There is moderate stool throughout the colon. The bowel gas pattern is unremarkable. No obstruction or free air. There are clips in the upper pelvic region.  IMPRESSION: No edema or consolidation. Stable scarring left base. Bowel gas pattern unremarkable. No obstruction or free air apparent.   Electronically Signed   By: Lowella Grip M.D.   On: 12/17/2013 14:07     EKG Interpretation None      MDM   Final diagnoses:  Calculus of gallbladder without cholecystitis without obstruction    Patient has cholelithiasis without evidence of cholecystitis. She does request to have her gallbladder removed today. I consulted the surgeon on call who has agreed to admit her and perform  a cholecystectomy.    Malvin Johns, MD 12/17/13 [id number]

## 2013-12-17 NOTE — Anesthesia Postprocedure Evaluation (Signed)
Anesthesia Post Note  Patient: Teresa Vasquez  Procedure(s) Performed: Procedure(s) (LRB): LAPAROSCOPIC CHOLECYSTECTOMY WITH INTRAOPERATIVE CHOLANGIOGRAM (N/A)  Anesthesia type: general  Patient location: PACU  Post pain: Pain level controlled  Post assessment: Patient's Cardiovascular Status Stable  Last Vitals:  Filed Vitals:   12/17/13 1730  BP: 127/55  Pulse: 67  Temp:   Resp: 12    Post vital signs: Reviewed and stable  Level of consciousness: sedated  Complications: No apparent anesthesia complications

## 2013-12-18 LAB — CBC
HCT: 37.4 % (ref 36.0–46.0)
Hemoglobin: 12.1 g/dL (ref 12.0–15.0)
MCH: 30 pg (ref 26.0–34.0)
MCHC: 32.4 g/dL (ref 30.0–36.0)
MCV: 92.6 fL (ref 78.0–100.0)
Platelets: 190 10*3/uL (ref 150–400)
RBC: 4.04 MIL/uL (ref 3.87–5.11)
RDW: 13.1 % (ref 11.5–15.5)
WBC: 6.1 10*3/uL (ref 4.0–10.5)

## 2013-12-18 LAB — BASIC METABOLIC PANEL
Anion gap: 13 (ref 5–15)
BUN: 8 mg/dL (ref 6–23)
CO2: 22 mEq/L (ref 19–32)
Calcium: 9.1 mg/dL (ref 8.4–10.5)
Chloride: 103 mEq/L (ref 96–112)
Creatinine, Ser: 0.74 mg/dL (ref 0.50–1.10)
GFR calc Af Amer: >90 mL/min (ref >90)
GFR, EST NON AFRICAN AMERICAN: 90 mL/min — AB (ref >90)
GLUCOSE: 145 mg/dL — AB (ref 70–99)
POTASSIUM: 4.7 meq/L (ref 3.7–5.3)
Sodium: 138 mEq/L (ref 137–147)

## 2013-12-18 MED ORDER — HYDROCODONE-ACETAMINOPHEN 5-325 MG PO TABS: 1.0000 | ORAL_TABLET | ORAL | Status: DC | PRN

## 2013-12-18 MED ORDER — IBUPROFEN 200 MG PO TABS: ORAL_TABLET | ORAL | Status: DC

## 2013-12-18 NOTE — Progress Notes (Addendum)
Physician Discharge Summary  Patient ID: Teresa Vasquez MRN: [id number] DOB/AGE: 61-Oct-1952 60 y.o.  Admit date: 12/17/2013 Discharge date: 12/18/2013  Admission Diagnoses: Acute and chronic cholecystitis with cholelithiasis Hx of anxiety/depression  IBS/Diverticulitis Hypertension  Hyperlipidemia  Discharge Diagnoses:  same Active Problems:   Symptomatic cholelithiasis   Cholecystitis, acute with cholelithiasis   PROCEDURES: Laparoscopic cholecystectomy with cholangiogram 12/17/13 Dr. Eloisa Northern Course: This is a 63 year old Caucasian female with a three-month history of intermittent biliary colic, fairly classic in pattern. Pain was severe today she came to the emergency room where ultrasound shows gallstones. AST and ALT are elevated. She has no abdominal tenderness although not severe. She has a history of a sigmoid colectomy done laparoscopically. She is brought to the operating room for cholecystectomy.  She was seen in the ED and taken to the OR by Dr. Dalbert Batman. She did well and was ready for discharge the following Am.  Condition on d/c:  Improved   Disposition: 01-Home or Self Care     Medication List         acetaminophen-codeine 300-30 MG per tablet  Commonly known as:  TYLENOL #3  Take 1 tablet by mouth every 4 (four) hours as needed for moderate pain.     albuterol 108 (90 BASE) MCG/ACT inhaler  Commonly known as:  PROVENTIL HFA;VENTOLIN HFA  Inhale 2 puffs into the lungs every 6 (six) hours as needed for wheezing or shortness of breath.     ALPRAZolam 1 MG tablet  Commonly known as:  XANAX  Take 0.5-1 mg by mouth 2 (two) times daily.     aspirin 81 MG tablet  Take 81 mg by mouth daily.     ezetimibe 10 MG tablet  Commonly known as:  ZETIA  Take 1 tablet (10 mg total) by mouth daily.     fluticasone 50 MCG/ACT nasal spray  Commonly known as:  FLONASE  Place 2 sprays into both nostrils daily.     HYDROcodone-acetaminophen 5-325 MG  per tablet  Commonly known as:  NORCO/VICODIN  Take 1-2 tablets by mouth every 4 (four) hours as needed for moderate pain.     ibuprofen 200 MG tablet  Commonly known as:  MOTRIN IB  You can take 2-3 tablets every 6 hours as needed for pain.     omeprazole 40 MG capsule  Commonly known as:  PRILOSEC  Take 40 mg by mouth daily.     VITAMIN D PO  Take 5,000 Units by mouth. daily       Follow-up Information   Follow up with [facility]. (Office should call you with appointment, if you don't hear by Tuesday call and request appointment.)    Contact information:   [address]   [place] [address] [phone]       Follow up with [name], MD. (As needed, let him know you had your Gallbladder out.)    Specialty:  Internal Medicine   Contact information:   [address] [place] [address] [phone]       Follow up with [name], MD. (Call if you have an issue.)    Specialty:  General Surgery   Contact information:   [place] [address] [phone]       Signed: Earnstine Regal 12/18/2013, 9:28 AM

## 2013-12-18 NOTE — Progress Notes (Signed)
1 Day Post-Op  Subjective: Stable and alert. Tolerating liquid diet without nausea. Angulating to bathroom. Percocet is too strong for her, she states. Requests hydrocodone. Operative findings  discussed with patient.  Objective: Vital signs in last 24 hours: Temp:  [97.2 F (36.2 C)-97.7 F (36.5 C)] 97.6 F (36.4 C) (10/17 0501) Pulse Rate:  [62-110] 95 (10/17 0501) Resp:  [12-22] 16 (10/17 0501) BP: (111-154)/(54-88) 111/60 mmHg (10/17 0501) SpO2:  [93 %-100 %] 95 % (10/17 0501) Weight:  [200 lb (90.719 kg)] 200 lb (90.719 kg) (10/16 1058) Last BM Date: 12/16/13  Intake/Output from previous day: 10/16 0701 - 10/17 0700 In: 2800 [P.O.:800; I.V.:2000] Out: 450 [Urine:450] Intake/Output this shift: Total I/O In: 1600 [P.O.:800; I.V.:800] Out: 450 [Urine:450]  General appearance: alert. Pleasant. Cooperative. Mental status normal. No distress. GI: soft. Incisions look good. Appropriate incisional tenderness. Not distended. Benign postop exam.  Lab Results:  Results for orders placed during the hospital encounter of 12/17/13 (from the past 24 hour(s))  CBC WITH DIFFERENTIAL     Status: Abnormal   Collection Time    12/17/13 11:54 AM      Result Value Ref Range   WBC 3.2 (*) 4.0 - 10.5 K/uL   RBC 4.47  3.87 - 5.11 MIL/uL   Hemoglobin 13.6  12.0 - 15.0 g/dL   HCT 41.7  36.0 - 46.0 %   MCV 93.3  78.0 - 100.0 fL   MCH 30.4  26.0 - 34.0 pg   MCHC 32.6  30.0 - 36.0 g/dL   RDW 13.1  11.5 - 15.5 %   Platelets 203  150 - 400 K/uL   Neutrophils Relative % 51  43 - 77 %   Neutro Abs 1.6 (*) 1.7 - 7.7 K/uL   Lymphocytes Relative 34  12 - 46 %   Lymphs Abs 1.1  0.7 - 4.0 K/uL   Monocytes Relative 10  3 - 12 %   Monocytes Absolute 0.3  0.1 - 1.0 K/uL   Eosinophils Relative 4  0 - 5 %   Eosinophils Absolute 0.1  0.0 - 0.7 K/uL   Basophils Relative 1  0 - 1 %   Basophils Absolute 0.0  0.0 - 0.1 K/uL  COMPREHENSIVE METABOLIC PANEL     Status: Abnormal   Collection Time   12/17/13 11:54 AM      Result Value Ref Range   Sodium 142  137 - 147 mEq/L   Potassium 4.2  3.7 - 5.3 mEq/L   Chloride 104  96 - 112 mEq/L   CO2 24  19 - 32 mEq/L   Glucose, Bld 90  70 - 99 mg/dL   BUN 11  6 - 23 mg/dL   Creatinine, Ser 0.83  0.50 - 1.10 mg/dL   Calcium 9.9  8.4 - 10.5 mg/dL   Total Protein 7.7  6.0 - 8.3 g/dL   Albumin 4.0  3.5 - 5.2 g/dL   AST 88 (*) 0 - 37 U/L   ALT 122 (*) 0 - 35 U/L   Alkaline Phosphatase 189 (*) 39 - 117 U/L   Total Bilirubin 0.5  0.3 - 1.2 mg/dL   GFR calc non Af Amer 75 (*) >90 mL/min   GFR calc Af Amer 86 (*) >90 mL/min   Anion gap 14  5 - 15  LIPASE, BLOOD     Status: None   Collection Time    12/17/13 11:54 AM      Result Value Ref Range  Lipase 21  11 - 59 U/L  URINALYSIS, ROUTINE W REFLEX MICROSCOPIC     Status: None   Collection Time    12/17/13 11:56 AM      Result Value Ref Range   Color, Urine YELLOW  YELLOW   APPearance CLEAR  CLEAR   Specific Gravity, Urine 1.013  1.005 - 1.030   pH 6.5  5.0 - 8.0   Glucose, UA NEGATIVE  NEGATIVE mg/dL   Hgb urine dipstick NEGATIVE  NEGATIVE   Bilirubin Urine NEGATIVE  NEGATIVE   Ketones, ur NEGATIVE  NEGATIVE mg/dL   Protein, ur NEGATIVE  NEGATIVE mg/dL   Urobilinogen, UA 0.2  0.0 - 1.0 mg/dL   Nitrite NEGATIVE  NEGATIVE   Leukocytes, UA NEGATIVE  NEGATIVE  CBC     Status: None   Collection Time    12/18/13  3:38 AM      Result Value Ref Range   WBC 6.1  4.0 - 10.5 K/uL   RBC 4.04  3.87 - 5.11 MIL/uL   Hemoglobin 12.1  12.0 - 15.0 g/dL   HCT 37.4  36.0 - 46.0 %   MCV 92.6  78.0 - 100.0 fL   MCH 30.0  26.0 - 34.0 pg   MCHC 32.4  30.0 - 36.0 g/dL   RDW 13.1  11.5 - 15.5 %   Platelets 190  150 - 400 K/uL  BASIC METABOLIC PANEL     Status: Abnormal   Collection Time    12/18/13  3:38 AM      Result Value Ref Range   Sodium 138  137 - 147 mEq/L   Potassium 4.7  3.7 - 5.3 mEq/L   Chloride 103  96 - 112 mEq/L   CO2 22  19 - 32 mEq/L   Glucose, Bld 145 (*) 70 - 99 mg/dL    BUN 8  6 - 23 mg/dL   Creatinine, Ser 0.74  0.50 - 1.10 mg/dL   Calcium 9.1  8.4 - 10.5 mg/dL   GFR calc non Af Amer 90 (*) >90 mL/min   GFR calc Af Amer >90  >90 mL/min   Anion gap 13  5 - 15     Studies/Results: Dg Cholangiogram Operative  12/17/2013   CLINICAL DATA:  Cholelithiasis and cholecystectomy.  EXAM: INTRAOPERATIVE CHOLANGIOGRAM  TECHNIQUE: Cholangiographic images from the C-arm fluoroscopic device were submitted for interpretation post-operatively. Please see the procedural report for the amount of contrast and the fluoroscopy time utilized.  COMPARISON:  Ultrasound 12/17/2013  FINDINGS: Contrast fills the cystic duct, common bile duct, duodenum and central intrahepatic ducts. No evidence for filling defects or stones.  IMPRESSION: Normal intraoperative cholangiogram.   Electronically Signed   By: Markus Daft M.D.   On: 12/17/2013 17:25   US Abdomen Complete  12/17/2013   CLINICAL DATA:  Initial evaluation for right-sided abdominal pain  EXAM: ULTRASOUND ABDOMEN COMPLETE  COMPARISON:  None.  FINDINGS: Gallbladder: There are mobile gallstones largest measuring 1.9 cm. There is no wall thickening or Murphy's sign.  Common bile duct: Diameter: 3 mm  Liver: No focal lesion identified. Within normal limits in parenchymal echogenicity.  IVC: No abnormality visualized.  Pancreas: Visualized portion unremarkable.  Spleen: Size and appearance within normal limits.  Right Kidney: Length: 10 cm. Echogenicity within normal limits. No mass or hydronephrosis visualized.  Left Kidney: Length: 10 cm. Echogenicity within normal limits. No mass or hydronephrosis visualized.  Abdominal aorta: No aneurysm visualized.  Other findings: None.  IMPRESSION: Cholelithiasis  Electronically Signed   By: Skipper Cliche M.D.   On: 12/17/2013 13:39   Dg Abd Acute W/chest  12/17/2013   CLINICAL DATA:  Right-sided abdominal and lower back pain  EXAM: ACUTE ABDOMEN SERIES (ABDOMEN 2 VIEW & CHEST 1 VIEW)  COMPARISON:   Chest radiograph June 14, 2013; abdomen radiograph November 16, 2013  FINDINGS: PA chest: There is scarring in the left base. Elsewhere lungs are clear. Heart is upper normal in size with pulmonary vascularity within normal limits. No adenopathy.  Supine and upright abdomen: There is moderate stool throughout the colon. The bowel gas pattern is unremarkable. No obstruction or free air. There are clips in the upper pelvic region.  IMPRESSION: No edema or consolidation. Stable scarring left base. Bowel gas pattern unremarkable. No obstruction or free air apparent.   Electronically Signed   By: Lowella Grip M.D.   On: 12/17/2013 14:07    . ALPRAZolam  0.5-1 mg Oral BID  . enoxaparin (LOVENOX) injection  40 mg Subcutaneous Q24H  . ezetimibe  10 mg Oral Daily  . fluticasone  2 spray Each Nare Daily  . pantoprazole  40 mg Oral Daily     Assessment/Plan: s/p Procedure(s): LAPAROSCOPIC CHOLECYSTECTOMY WITH INTRAOPERATIVE CHOLANGIOGRAM  POD #1. Laparoscopic cholecystectomy. Stable. Advance diet and activities this morning Anticipate discharge today Outpatient diet and activities discussed in detail Followup in office 2-3 weeks.  @PROBHOSP @  LOS: 1 day    Tarrance Januszewski M 12/18/2013  . .prob

## 2013-12-18 NOTE — Discharge Summary (Signed)
Agree with summary and followup plans.  Teresa Vasquez. Dalbert Batman, M.D., [facility] General and Minimally invasive Surgery Breast and Colorectal Surgery Office:   [phone] Pager:   [phone]

## 2013-12-18 NOTE — Discharge Instructions (Signed)
Laparoscopic Cholecystectomy, Care After °Refer to this sheet in the next few weeks. These instructions provide you with information on caring for yourself after your procedure. Your health care provider may also give you more specific instructions. Your treatment has been planned according to current medical practices, but problems sometimes occur. Call your health care provider if you have any problems or questions after your procedure. °WHAT TO EXPECT AFTER THE PROCEDURE °After your procedure, it is typical to have the following: °· Pain at your incision sites. You will be given pain medicines to control the pain. °· Mild nausea or vomiting. This should improve after the first 24 hours. °· Bloating and possibly shoulder pain from the gas used during the procedure. This will improve after the first 24 hours. °HOME CARE INSTRUCTIONS  °· Change bandages (dressings) as directed by your health care provider. °· Keep the wound dry and clean. You may wash the wound gently with soap and water. Gently blot or dab the area dry. °· Do not take baths or use swimming pools or hot tubs for 2 weeks or until your health care provider approves. °· Only take over-the-counter or prescription medicines as directed by your health care provider. °· Continue your normal diet as directed by your health care provider. °· Do not lift anything heavier than 10 pounds (4.5 kg) until your health care provider approves. °· Do not play contact sports for 1 week or until your health care provider approves. °SEEK MEDICAL CARE IF:  °· You have redness, swelling, or increasing pain in the wound. °· You notice yellowish-white fluid (pus) coming from the wound. °· You have drainage from the wound that lasts longer than 1 day. °· You notice a bad smell coming from the wound or dressing. °· Your surgical cuts (incisions) break open. °SEEK IMMEDIATE MEDICAL CARE IF:  °· You develop a rash. °· You have difficulty breathing. °· You have chest pain. °· You  have a fever. °· You have increasing pain in the shoulders (shoulder strap areas). °· You have dizzy episodes or faint while standing. °· You have severe abdominal pain. °· You feel sick to your stomach (nauseous) or throw up (vomit) and this lasts for more than 1 day. °Document Released: 02/18/2005 Document Revised: 12/09/2012 Document Reviewed: 09/30/2012 °ExitCare® Patient Information ©2015 [employer]. This information is not intended to replace advice given to you by your health care provider. Make sure you discuss any questions you have with your health care provider. ° °[facility] ______[facility] °LAPAROSCOPIC SURGERY: POST OP INSTRUCTIONS °Always review your discharge instruction sheet given to you by the facility where your surgery was performed. °IF YOU HAVE DISABILITY OR FAMILY LEAVE FORMS, YOU MUST BRING THEM TO THE OFFICE FOR PROCESSING.   °DO NOT GIVE THEM TO YOUR DOCTOR. ° °1. A prescription for pain medication may be given to you upon discharge.  Take your pain medication as prescribed, if needed.  If narcotic pain medicine is not needed, then you may take acetaminophen (Tylenol) or ibuprofen (Advil) as needed. °2. Take your usually prescribed medications unless otherwise directed. °3. If you need a refill on your pain medication, please contact your pharmacy.  They will contact our office to request authorization. Prescriptions will not be filled after 5pm or on week-ends. °4. You should follow a light diet the first few days after arrival home, such as soup and crackers, etc.  Be sure to include lots of fluids daily. °5. Most patients will experience some   swelling and bruising in the area of the incisions.  Ice packs will help.  Swelling and bruising can take several days to resolve.  °6. It is common to experience some constipation if taking pain medication after surgery.  Increasing fluid intake and taking a stool softener (such as Colace) will usually help or prevent this problem  from occurring.  A mild laxative (Milk of Magnesia or Miralax) should be taken according to package instructions if there are no bowel movements after 48 hours. °7. Unless discharge instructions indicate otherwise, you may remove your bandages 24-48 hours after surgery, and you may shower at that time.  You may have steri-strips (small skin tapes) in place directly over the incision.  These strips should be left on the skin for 7-10 days.  If your surgeon used skin glue on the incision, you may shower in 24 hours.  The glue will flake off over the next 2-3 weeks.  Any sutures or staples will be removed at the office during your follow-up visit. °8. ACTIVITIES:  You may resume regular (light) daily activities beginning the next day--such as daily self-care, walking, climbing stairs--gradually increasing activities as tolerated.  You may have sexual intercourse when it is comfortable.  Refrain from any heavy lifting or straining until approved by your doctor. °a. You may drive when you are no longer taking prescription pain medication, you can comfortably wear a seatbelt, and you can safely maneuver your car and apply brakes. °b. RETURN TO WORK:  __________________________________________________________ °9. You should see your doctor in the office for a follow-up appointment approximately 2-3 weeks after your surgery.  Make sure that you call for this appointment within a day or two after you arrive home to insure a convenient appointment time. °10. OTHER INSTRUCTIONS: __________________________________________________________________________________________________________________________ __________________________________________________________________________________________________________________________ °WHEN TO CALL YOUR DOCTOR: °1. Fever over 101.0 °2. Inability to urinate °3. Continued bleeding from incision. °4. Increased pain, redness, or drainage from the incision. °5. Increasing abdominal pain ° °The  clinic staff is available to answer your questions during regular business hours.  Please don’t hesitate to call and ask to speak to one of the nurses for clinical concerns.  If you have a medical emergency, go to the nearest emergency room or call 911.  A surgeon from [facility] is always on call at the hospital. °[address], [address], [place], [place]  [address] ? [address] [address], [place], [place]   [address] °[phone] ? [phone] ? FAX [phone] °Web site: [email or web address] °

## 2013-12-18 NOTE — Progress Notes (Signed)
IV removed per order. Discharge instructions and prescriptions given to patient with good teachback. Discharged via wheelchair to sister's care with NT present. Melford Aase, RN

## 2013-12-18 NOTE — Discharge Summary (Signed)
Physician Discharge Summary   Patient ID: [name] MRN: [id number] DOB/AGE: [birth year] 63 y.o.  Admit date: 12/17/2013 Discharge date: 12/18/2013  Admission Diagnoses: Acute and chronic cholecystitis with cholelithiasis Hx of anxiety/depression   IBS/Diverticulitis Hypertension   Hyperlipidemia  Discharge Diagnoses:   same Active Problems:   Symptomatic cholelithiasis   Cholecystitis, acute with cholelithiasis   PROCEDURES: Laparoscopic cholecystectomy with cholangiogram 12/17/13 Dr. Eloisa Northern Course: This is a 63 year old Caucasian female with a three-month history of intermittent biliary colic, fairly classic in pattern. Pain was severe today she came to the emergency room where ultrasound shows gallstones. AST and ALT are elevated. She has no abdominal tenderness although not severe. She has a history of a sigmoid colectomy done laparoscopically. She is brought to the operating room for cholecystectomy.  She was seen in the ED and taken to the OR by Dr. Dalbert Batman. She did well and was ready for discharge the following Am.  Condition on d/c:  Improved   Disposition: 01-Home or Self Care      Medication List              acetaminophen-codeine 300-30 MG per tablet   Commonly known as:  TYLENOL #3   Take 1 tablet by mouth every 4 (four) hours as needed for moderate pain.        albuterol 108 (90 BASE) MCG/ACT inhaler   Commonly known as:  PROVENTIL HFA;VENTOLIN HFA   Inhale 2 puffs into the lungs every 6 (six) hours as needed for wheezing or shortness of breath.        ALPRAZolam 1 MG tablet   Commonly known as:  XANAX   Take 0.5-1 mg by mouth 2 (two) times daily.        aspirin 81 MG tablet   Take 81 mg by mouth daily.        ezetimibe 10 MG tablet   Commonly known as:  ZETIA   Take 1 tablet (10 mg total) by mouth daily.        fluticasone 50 MCG/ACT nasal spray   Commonly known as:  FLONASE   Place 2 sprays into both nostrils daily.         HYDROcodone-acetaminophen 5-325 MG per tablet   Commonly known as:  NORCO/VICODIN   Take 1-2 tablets by mouth every 4 (four) hours as needed for moderate pain.        ibuprofen 200 MG tablet   Commonly known as:  MOTRIN IB   You can take 2-3 tablets every 6 hours as needed for pain.        omeprazole 40 MG capsule   Commonly known as:  PRILOSEC   Take 40 mg by mouth daily.        VITAMIN D PO   Take 5,000 Units by mouth. daily          Follow-up Information     Follow up with [facility]. (Office should call you with appointment, if you don't hear by Tuesday call and request appointment.)      Contact information:     [address]    [place] [address] [phone]           Follow up with [name], MD. (As needed, let him know you had your Gallbladder out.)      Specialty:  Internal Medicine     Contact information:     [address]  [address] [place] [address] [phone]           Follow up with [name], MD. (Call if you have an issue.)      Specialty:  General Surgery     Contact information:     [place] [address] [phone]         Signed: Earnstine Regal 12/18/2013, 9:28 AM

## 2013-12-18 NOTE — Progress Notes (Signed)
General surgery attending.  Agree with discharge summary and followup plans as outlined.  Teresa Vasquez. Dalbert Batman, M.D., [facility] General and Minimally invasive Surgery Breast and Colorectal Surgery Office:   [phone] Pager:   [phone]

## 2013-12-18 NOTE — Progress Notes (Signed)
Agree with summary and followup plans.  Teresa Vasquez. Dalbert Batman, M.D., [facility] General and Minimally invasive Surgery Breast and Colorectal Surgery Office:   [phone] Pager:   [phone]

## 2013-12-21 ENCOUNTER — Encounter (HOSPITAL_COMMUNITY): Payer: Self-pay | Admitting: General Surgery

## 2013-12-21 ENCOUNTER — Ambulatory Visit: Payer: BC Managed Care – PPO | Admitting: Physician Assistant

## 2013-12-28 ENCOUNTER — Ambulatory Visit: Payer: Self-pay | Admitting: Physician Assistant

## 2014-01-02 ENCOUNTER — Encounter: Payer: Self-pay | Admitting: Physician Assistant

## 2014-01-02 DIAGNOSIS — R109 Unspecified abdominal pain: Secondary | ICD-10-CM

## 2014-01-13 ENCOUNTER — Other Ambulatory Visit: Payer: Self-pay | Admitting: Physician Assistant

## 2014-02-11 ENCOUNTER — Other Ambulatory Visit: Payer: Self-pay | Admitting: Physician Assistant

## 2014-03-14 ENCOUNTER — Other Ambulatory Visit: Payer: Self-pay | Admitting: Internal Medicine

## 2014-04-04 ENCOUNTER — Other Ambulatory Visit: Payer: Self-pay | Admitting: Physician Assistant

## 2014-04-11 ENCOUNTER — Encounter: Payer: Self-pay | Admitting: Internal Medicine

## 2014-04-11 ENCOUNTER — Ambulatory Visit (INDEPENDENT_AMBULATORY_CARE_PROVIDER_SITE_OTHER): Payer: BC Managed Care – PPO | Admitting: Internal Medicine

## 2014-04-11 VITALS — BP 106/70 | HR 84 | Temp 98.6°F | Resp 16 | Ht 66.5 in | Wt 200.6 lb

## 2014-04-11 DIAGNOSIS — J0140 Acute pansinusitis, unspecified: Secondary | ICD-10-CM

## 2014-04-11 MED ORDER — AZITHROMYCIN 250 MG PO TABS: ORAL_TABLET | ORAL | Status: DC

## 2014-04-11 MED ORDER — HYDROCODONE-ACETAMINOPHEN 5-325 MG PO TABS: ORAL_TABLET | ORAL | Status: AC

## 2014-04-11 MED ORDER — PREDNISONE 20 MG PO TABS: ORAL_TABLET | ORAL | Status: DC

## 2014-04-11 NOTE — Patient Instructions (Signed)

## 2014-04-11 NOTE — Progress Notes (Signed)
   Subjective:    Patient ID: Teresa Vasquez, female    DOB: 05-22-1950, 64 y.o.   MRN: [id number]  HPI pt has 2 wk hx/o progressive worsening sx's of HA, head congestion. No fever/chills. Low lower resp sx's.   Medication Sig  . albuterol  HFA  inhaler Inhale 2 puffs into the lungs every 6 (six) hours as needed for wheezing or shortness of breath.  . ALPRAZolam (XANAX) 1 MG tablet TAKE 1/2-1 TABLET BY MOUTH TWICE A DAY AS NEEDED  . aspirin 81 MG tablet Take 81 mg by mouth daily.   . Cholecalciferol (VITAMIN D PO) Take 5,000 Units by mouth. daily  . ezetimibe (ZETIA) 10 MG tablet Take 1 tablet (10 mg total) by mouth daily.  . fluticasone (FLONASE)  nasal spray Place 2 sprays into both nostrils daily.  Marland Kitchen ibuprofen (MOTRIN IB) 200 MG tablet You can take 2-3 tablets every 6 hours as needed for pain.  Marland Kitchen omeprazole (PRILOSEC) 40 MG capsule Take 1 capsule (40 mg total) by mouth daily.  . prochlorperazine (COMPAZINE) 10 MG tablet   . omeprazole (PRILOSEC) 40 MG capsule Take 40 mg by mouth daily.   Allergies  Allergen Reactions  . Atorvastatin   . Levaquin [Levofloxacin In D5w] Nausea Only   Past Medical History  Diagnosis Date  . Anxiety and depression   . Diverticulitis   . Irritable bowel syndrome   . Depression   . Hypertension   . Hyperlipidemia    Review of Systems  10 pt systems review negative except as above.     Objective:   Physical Exam   BP 106/70 mmHg  Pulse 84  Temp(Src) 98.6 F (37 C)  Resp 16  Ht 5' 6.5" (1.689 m)  Wt 200 lb 9.6 oz (90.992 kg)  BMI 31.90 kg/m2   HEENT - Eac's patent. TM's Nl. EOM's full. PERRLA. Bilat frontal & maxillary tenderness.  NasoOroPharynx clear. Neck - supple. Nl Thyroid. Carotids 2+ & No bruits, nodes, JVD Chest - Clear equal BS w/o Rales, rhonchi, wheezes. No cough.  Cor - Nl HS. RRR w/o sig MGR. PP 1(+). No edema. MS- FROM w/o deformities. Muscle power, tone and bulk Nl. Gait Nl. Neuro - No obvious Cr N abnormalities.  Sensory, motor and Cerebellar functions appear Nl w/o focal abnormalities. Psyche - Mental status normal & appropriate.  No delusions, ideations or obvious mood abnormalities. Skin - clear - no exposed rash or lesions.    Assessment & Plan:   1. Subacute pansinusitis  -  Rx Z pak & 1 rf , prednisone pulse/taper Norco 5 prn  - discussed meds/SE's and call or return prn

## 2014-04-13 ENCOUNTER — Other Ambulatory Visit: Payer: Self-pay | Admitting: Internal Medicine

## 2014-04-13 ENCOUNTER — Telehealth: Payer: Self-pay | Admitting: *Deleted

## 2014-04-13 MED ORDER — MOXIFLOXACIN HCL 400 MG PO TABS: ORAL_TABLET | ORAL | Status: DC

## 2014-04-13 NOTE — Telephone Encounter (Signed)
Patient called and states she is taking Zpak and Prednisone and still having sinus drainage and pressure with a sore throat.  Patient also having a headache and cough.  Per Dr Melford Aase, stop Z-pak and start new Rx for Avelox.

## 2014-05-10 ENCOUNTER — Other Ambulatory Visit: Payer: Self-pay | Admitting: Physician Assistant

## 2014-05-27 ENCOUNTER — Other Ambulatory Visit: Payer: Self-pay | Admitting: Physician Assistant

## 2014-06-17 ENCOUNTER — Other Ambulatory Visit: Payer: Self-pay | Admitting: Internal Medicine

## 2014-06-17 DIAGNOSIS — K219 Gastro-esophageal reflux disease without esophagitis: Secondary | ICD-10-CM

## 2014-06-20 ENCOUNTER — Other Ambulatory Visit: Payer: Self-pay | Admitting: *Deleted

## 2014-06-20 DIAGNOSIS — K219 Gastro-esophageal reflux disease without esophagitis: Secondary | ICD-10-CM

## 2014-06-24 ENCOUNTER — Other Ambulatory Visit: Payer: Self-pay

## 2014-06-24 DIAGNOSIS — K219 Gastro-esophageal reflux disease without esophagitis: Secondary | ICD-10-CM

## 2014-06-24 MED ORDER — OMEPRAZOLE 40 MG PO CPDR: DELAYED_RELEASE_CAPSULE | ORAL | Status: DC

## 2014-09-27 ENCOUNTER — Encounter: Payer: Self-pay | Admitting: Internal Medicine

## 2014-09-27 ENCOUNTER — Encounter: Payer: Self-pay | Admitting: Emergency Medicine

## 2014-11-21 ENCOUNTER — Encounter: Payer: Self-pay | Admitting: Gastroenterology

## 2014-11-24 ENCOUNTER — Other Ambulatory Visit: Payer: Self-pay | Admitting: Internal Medicine

## 2014-12-05 ENCOUNTER — Encounter: Payer: Self-pay | Admitting: Physician Assistant

## 2014-12-05 ENCOUNTER — Ambulatory Visit (INDEPENDENT_AMBULATORY_CARE_PROVIDER_SITE_OTHER): Payer: BC Managed Care – PPO | Admitting: Physician Assistant

## 2014-12-05 VITALS — BP 118/76 | HR 80 | Temp 97.7°F | Resp 16 | Ht 66.5 in | Wt 198.0 lb

## 2014-12-05 DIAGNOSIS — Z79899 Other long term (current) drug therapy: Secondary | ICD-10-CM

## 2014-12-05 DIAGNOSIS — K2100 Gastro-esophageal reflux disease with esophagitis, without bleeding: Secondary | ICD-10-CM

## 2014-12-05 DIAGNOSIS — R197 Diarrhea, unspecified: Secondary | ICD-10-CM

## 2014-12-05 DIAGNOSIS — E559 Vitamin D deficiency, unspecified: Secondary | ICD-10-CM

## 2014-12-05 DIAGNOSIS — R7309 Other abnormal glucose: Secondary | ICD-10-CM | POA: Diagnosis not present

## 2014-12-05 DIAGNOSIS — R030 Elevated blood-pressure reading, without diagnosis of hypertension: Secondary | ICD-10-CM | POA: Diagnosis not present

## 2014-12-05 DIAGNOSIS — R1032 Left lower quadrant pain: Secondary | ICD-10-CM

## 2014-12-05 DIAGNOSIS — E782 Mixed hyperlipidemia: Secondary | ICD-10-CM

## 2014-12-05 LAB — CBC WITH DIFFERENTIAL/PLATELET
BASOS PCT: 1 % (ref 0–1)
Basophils Absolute: 0 10*3/uL (ref 0.0–0.1)
EOS ABS: 0.1 10*3/uL (ref 0.0–0.7)
EOS PCT: 3 % (ref 0–5)
HCT: 41.1 % (ref 36.0–46.0)
Hemoglobin: 13.7 g/dL (ref 12.0–15.0)
Lymphocytes Relative: 29 % (ref 12–46)
Lymphs Abs: 1.1 10*3/uL (ref 0.7–4.0)
MCH: 30.2 pg (ref 26.0–34.0)
MCHC: 33.3 g/dL (ref 30.0–36.0)
MCV: 90.5 fL (ref 78.0–100.0)
MONOS PCT: 8 % (ref 3–12)
MPV: 10.4 fL (ref 8.6–12.4)
Monocytes Absolute: 0.3 10*3/uL (ref 0.1–1.0)
NEUTROS PCT: 59 % (ref 43–77)
Neutro Abs: 2.2 10*3/uL (ref 1.7–7.7)
PLATELETS: 187 10*3/uL (ref 150–400)
RBC: 4.54 MIL/uL (ref 3.87–5.11)
RDW: 13.4 % (ref 11.5–15.5)
WBC: 3.8 10*3/uL — ABNORMAL LOW (ref 4.0–10.5)

## 2014-12-05 LAB — BASIC METABOLIC PANEL WITH GFR
BUN: 9 mg/dL (ref 7–25)
CALCIUM: 10 mg/dL (ref 8.6–10.4)
CHLORIDE: 106 mmol/L (ref 98–110)
CO2: 29 mmol/L (ref 20–31)
CREATININE: 0.86 mg/dL (ref 0.50–0.99)
GFR, EST AFRICAN AMERICAN: 84 mL/min (ref >=60)
GFR, Est Non African American: 73 mL/min (ref >=60)
Glucose, Bld: 86 mg/dL (ref 65–99)
Potassium: 5 mmol/L (ref 3.5–5.3)
SODIUM: 144 mmol/L (ref 135–146)

## 2014-12-05 LAB — HEMOGLOBIN A1C
Hgb A1c MFr Bld: 5.7 % — ABNORMAL HIGH (ref <5.7)
Mean Plasma Glucose: 117 mg/dL — ABNORMAL HIGH (ref <117)

## 2014-12-05 LAB — LIPID PANEL
CHOL/HDL RATIO: 8 ratio — AB (ref <=5.0)
CHOLESTEROL: 248 mg/dL — AB (ref 125–200)
HDL: 31 mg/dL — AB (ref >=46)
LDL Cholesterol: 168 mg/dL — ABNORMAL HIGH (ref <130)
Triglycerides: 245 mg/dL — ABNORMAL HIGH (ref <150)
VLDL: 49 mg/dL — ABNORMAL HIGH (ref <30)

## 2014-12-05 LAB — HEPATIC FUNCTION PANEL
ALT: 5 U/L — ABNORMAL LOW (ref 6–29)
AST: 15 U/L (ref 10–35)
Albumin: 4.4 g/dL (ref 3.6–5.1)
Alkaline Phosphatase: 115 U/L (ref 33–130)
BILIRUBIN DIRECT: 0.1 mg/dL (ref <=0.2)
BILIRUBIN TOTAL: 0.5 mg/dL (ref 0.2–1.2)
Indirect Bilirubin: 0.4 mg/dL (ref 0.2–1.2)
Total Protein: 6.8 g/dL (ref 6.1–8.1)

## 2014-12-05 LAB — MAGNESIUM: Magnesium: 2 mg/dL (ref 1.5–2.5)

## 2014-12-05 MED ORDER — DICYCLOMINE HCL 20 MG PO TABS: 20.0000 mg | ORAL_TABLET | Freq: Three times a day (TID) | ORAL | Status: DC | PRN

## 2014-12-05 MED ORDER — CIPROFLOXACIN HCL 500 MG PO TABS: 500.0000 mg | ORAL_TABLET | Freq: Two times a day (BID) | ORAL | Status: AC

## 2014-12-05 MED ORDER — ALPRAZOLAM 1 MG PO TABS: ORAL_TABLET | ORAL | Status: DC

## 2014-12-05 MED ORDER — METRONIDAZOLE 500 MG PO TABS: 500.0000 mg | ORAL_TABLET | Freq: Three times a day (TID) | ORAL | Status: AC

## 2014-12-05 NOTE — Progress Notes (Signed)
Assessment and Plan:  1. Hypertension -Continue medication, monitor blood pressure at home. Continue DASH diet.  Reminder to go to the ER if any CP, SOB, nausea, dizziness, severe HA, changes vision/speech, left arm numbness and tingling and jaw pain.  2. Cholesterol -Continue diet and exercise. Check cholesterol.   3. Obesity with co morbidities - long discussion about weight loss, diet, and exercise  4. Vitamin D Def - check level and continue medications.   5. Diarrhea History of diverticulitis no rebound tenderness/peritoneal signs at this time, get on cipro/flaygl, check labs- if this does not help we can try Questran/welchol since diarrhea worse since choley, or we can try celexa for stress/IBS and can get stool samples.    Continue diet and meds as discussed. Further disposition pending results of labs. Over 30 minutes of exam, counseling, chart review, and critical decision making was performed  HPI 64 y.o. female  presents for 3 month follow up on hypertension, cholesterol, prediabetes, and vitamin D deficiency.   Her blood pressure has been controlled at home, today their BP is BP: 118/76 mmHg  She does not workout. She denies chest pain, shortness of breath, dizziness. She has had diarrhea/GERD for last month worse, taking imodium/gas . She has been having diarrhea/constipation issues for 5 + years. Normal colonoscopy 2011. Slightly worse since choley a year ago.   She is not on cholesterol medication and denies myalgias. Her cholesterol is not at goal. The cholesterol last visit was:   Lab Results  Component Value Date   CHOL 219* 09/15/2013   HDL 39* 09/15/2013   LDLCALC 141* 09/15/2013   TRIG 197* 09/15/2013   CHOLHDL 5.6 09/15/2013   Last A1C in the office was:  Lab Results  Component Value Date   HGBA1C 5.5 09/15/2013   Patient is on Vitamin D supplement.   Lab Results  Component Value Date   VD25OH 48 09/15/2013     BMI is Body mass index is 31.48  kg/(m^2)., she is working on diet and exercise. Wt Readings from Last 3 Encounters:  12/05/14 198 lb (89.812 kg)  04/11/14 200 lb 9.6 oz (90.992 kg)  12/17/13 200 lb (90.719 kg)    Current Medications:  Current Outpatient Prescriptions on File Prior to Visit  Medication Sig Dispense Refill  . albuterol (PROVENTIL HFA;VENTOLIN HFA) 108 (90 BASE) MCG/ACT inhaler Inhale 2 puffs into the lungs every 6 (six) hours as needed for wheezing or shortness of breath. 1 Inhaler 2  . ALPRAZolam (XANAX) 1 MG tablet TAKE 1/2 TO 1 TABLET TWICE A DAY 180 tablet 1  . aspirin 81 MG tablet Take 81 mg by mouth daily.     . Cholecalciferol (VITAMIN D PO) Take 5,000 Units by mouth. daily    . fluticasone (FLONASE) 50 MCG/ACT nasal spray Place 2 sprays into both nostrils daily.    Marland Kitchen ibuprofen (MOTRIN IB) 200 MG tablet You can take 2-3 tablets every 6 hours as needed for pain. 30 tablet 0  . montelukast (SINGULAIR) 10 MG tablet TAKE 1 TABLET BY MOUTH DAILY 30 tablet 99  . omeprazole (PRILOSEC) 40 MG capsule TAKE 1 CAPSULE (40 MG TOTAL) BY MOUTH DAILY. 30 capsule 1  . moxifloxacin (AVELOX) 400 MG tablet Take 1 tablet daily with food for infection (Patient not taking: Reported on 12/05/2014) 7 tablet 0   No current facility-administered medications on file prior to visit.   Medical History:  Past Medical History  Diagnosis Date  . Anxiety and depression   .  Diverticulitis   . Irritable bowel syndrome   . Depression   . Hypertension   . Hyperlipidemia    Allergies:  Allergies  Allergen Reactions  . Atorvastatin   . Levaquin [Levofloxacin In D5w] Nausea Only     Review of Systems:  Review of Systems  Constitutional: Negative.   HENT: Negative.   Eyes: Negative.   Respiratory: Negative.   Cardiovascular: Negative.   Gastrointestinal: Positive for heartburn, abdominal pain and diarrhea. Negative for nausea, vomiting, constipation, blood in stool and melena.  Genitourinary: Negative.   Musculoskeletal:  Negative.   Skin: Negative.   Neurological: Negative.   Psychiatric/Behavioral: Negative for depression, suicidal ideas, hallucinations, memory loss and substance abuse. The patient is nervous/anxious. The patient does not have insomnia.     Family history- Review and unchanged Social history- Review and unchanged Physical Exam: BP 118/76 mmHg  Pulse 80  Temp(Src) 97.7 F (36.5 C) (Temporal)  Resp 16  Ht 5' 6.5" (1.689 m)  Wt 198 lb (89.812 kg)  BMI 31.48 kg/m2  SpO2 97% Wt Readings from Last 3 Encounters:  12/05/14 198 lb (89.812 kg)  04/11/14 200 lb 9.6 oz (90.992 kg)  12/17/13 200 lb (90.719 kg)   General Appearance: Well nourished, in no apparent distress. Eyes: PERRLA, EOMs, conjunctiva no swelling or erythema Sinuses: No Frontal/maxillary tenderness ENT/Mouth: Ext aud canals clear, TMs without erythema, bulging. No erythema, swelling, or exudate on post pharynx.  Tonsils not swollen or erythematous. Hearing normal.  Neck: Supple, thyroid normal.  Respiratory: Respiratory effort normal, BS equal bilaterally without rales, rhonchi, wheezing or stridor.  Cardio: RRR with no MRGs. Brisk peripheral pulses without edema.  Abdomen: Soft, + BS, LLQ tenderness, no guarding, rebound, hernias, masses. Lymphatics: Non tender without lymphadenopathy.  Musculoskeletal: Full ROM, 5/5 strength, Normal gait Skin: Warm, dry without rashes, lesions, ecchymosis.  Neuro: Cranial nerves intact. Normal muscle tone, no cerebellar symptoms. Psych: Awake and oriented X 3, normal affect, Insight and Judgment appropriate.    Vicie Mutters, PA-C 12:06 PM [facility]

## 2014-12-05 NOTE — Patient Instructions (Signed)
Take cipro/flaygl- if this does not help we are going to try questran/welchol samples  If this does not help than we should try zoloft/celexa again  And follow up with Dr. Deatra Ina  Can take bentyl as needed for diarrhea.   Irritable Bowel Syndrome Irritable bowel syndrome (IBS) is caused by a disturbance of normal bowel function and is a common digestive disorder. You may also hear this condition called spastic colon, mucous colitis, and irritable colon. There is no cure for IBS. However, symptoms often gradually improve or disappear with a good diet, stress management, and medicine. This condition usually appears in late adolescence or early adulthood. Women develop it twice as often as men. CAUSES  After food has been digested and absorbed in the small intestine, waste material is moved into the large intestine, or colon. In the colon, water and salts are absorbed from the undigested products coming from the small intestine. The remaining residue, or fecal material, is held for elimination. Under normal circumstances, gentle, rhythmic contractions of the bowel walls push the fecal material along the colon toward the rectum. In IBS, however, these contractions are irregular and poorly coordinated. The fecal material is either retained too long, resulting in constipation, or expelled too soon, producing diarrhea. SIGNS AND SYMPTOMS  The most common symptom of IBS is abdominal pain. It is often in the lower left side of the abdomen, but it may occur anywhere in the abdomen. The pain comes from spasms of the bowel muscles happening too much and from the buildup of gas and fecal material in the colon. This pain:  Can range from sharp abdominal cramps to a dull, continuous ache.  Often worsens soon after eating.  Is often relieved by having a bowel movement or passing gas. Abdominal pain is usually accompanied by constipation, but it may also produce diarrhea. The diarrhea often occurs right after a  meal or upon waking up in the morning. The stools are often soft, watery, and flecked with mucus. Other symptoms of IBS include:  Bloating.  Loss of appetite.  Heartburn.  Backache.  Dull pain in the arms or shoulders.  Nausea.  Burping.  Vomiting.  Gas. IBS may also cause symptoms that are unrelated to the digestive system, such as:  Fatigue.  Headaches.  Anxiety.  Shortness of breath.  Trouble concentrating.  Dizziness. These symptoms tend to come and go. DIAGNOSIS  The symptoms of IBS may seem like symptoms of other, more serious digestive disorders. Your health care provider may want to perform tests to exclude these disorders.  TREATMENT Many medicines are available to help correct bowel function or relieve bowel spasms and abdominal pain. Among the medicines available are:  Laxatives for severe constipation and to help restore normal bowel habits.  Specific antidiarrheal medicines to treat severe or lasting diarrhea.  Antispasmodic agents to relieve intestinal cramps. Your health care provider may also decide to treat you with a mild tranquilizer or sedative during unusually stressful periods in your life. Your health care provider may also prescribe antidepressant medicine. The use of this medicine has been shown to reduce pain and other symptoms of IBS. Remember that if any medicine is prescribed for you, you should take it exactly as directed. Make sure your health care provider knows how well it worked for you. HOME CARE INSTRUCTIONS   Take all medicines as directed by your health care provider.  Avoid foods that are high in fat or oils, such as heavy cream, butter, frankfurters, sausage, and  other fatty meats.  Avoid foods that make you go to the bathroom, such as fruit, fruit juice, and dairy products.  Cut out carbonated drinks, chewing gum, and "gassy" foods such as beans and cabbage. This may help relieve bloating and burping.  Eat foods with  bran, and drink plenty of liquids with the bran foods. This helps relieve constipation.  Keep track of what foods seem to bring on your symptoms.  Avoid emotionally charged situations or circumstances that produce anxiety.  Start or continue exercising.  Get plenty of rest and sleep. Document Released: 02/18/2005 Document Revised: 02/23/2013 Document Reviewed: 10/09/2007 [facility] Patient Information 2015 [place], [place]. This information is not intended to replace advice given to you by your health care provider. Make sure you discuss any questions you have with your health care provider.

## 2014-12-06 LAB — TSH: TSH: 2.737 u[IU]/mL (ref 0.350–4.500)

## 2014-12-06 LAB — VITAMIN D 25 HYDROXY (VIT D DEFICIENCY, FRACTURES): Vit D, 25-Hydroxy: 33 ng/mL (ref 30–100)

## 2014-12-12 ENCOUNTER — Ambulatory Visit (INDEPENDENT_AMBULATORY_CARE_PROVIDER_SITE_OTHER): Payer: BC Managed Care – PPO | Admitting: Physician Assistant

## 2014-12-12 ENCOUNTER — Encounter: Payer: Self-pay | Admitting: Physician Assistant

## 2014-12-12 VITALS — BP 128/70 | HR 102 | Temp 97.9°F | Ht 66.0 in | Wt 196.0 lb

## 2014-12-12 DIAGNOSIS — K2100 Gastro-esophageal reflux disease with esophagitis, without bleeding: Secondary | ICD-10-CM

## 2014-12-12 DIAGNOSIS — R1032 Left lower quadrant pain: Secondary | ICD-10-CM

## 2014-12-12 DIAGNOSIS — R197 Diarrhea, unspecified: Secondary | ICD-10-CM | POA: Diagnosis not present

## 2014-12-12 LAB — CBC WITH DIFFERENTIAL/PLATELET
Basophils Absolute: 0 10*3/uL (ref 0.0–0.1)
Basophils Relative: 1 % (ref 0–1)
Eosinophils Absolute: 0.1 10*3/uL (ref 0.0–0.7)
Eosinophils Relative: 2 % (ref 0–5)
HEMATOCRIT: 42 % (ref 36.0–46.0)
HEMOGLOBIN: 14.3 g/dL (ref 12.0–15.0)
LYMPHS PCT: 26 % (ref 12–46)
Lymphs Abs: 1.3 10*3/uL (ref 0.7–4.0)
MCH: 30.6 pg (ref 26.0–34.0)
MCHC: 34 g/dL (ref 30.0–36.0)
MCV: 89.9 fL (ref 78.0–100.0)
MONO ABS: 0.4 10*3/uL (ref 0.1–1.0)
MONOS PCT: 8 % (ref 3–12)
MPV: 10 fL (ref 8.6–12.4)
Neutro Abs: 3.1 10*3/uL (ref 1.7–7.7)
Neutrophils Relative %: 63 % (ref 43–77)
Platelets: 209 10*3/uL (ref 150–400)
RBC: 4.67 MIL/uL (ref 3.87–5.11)
RDW: 13 % (ref 11.5–15.5)
WBC: 4.9 10*3/uL (ref 4.0–10.5)

## 2014-12-12 LAB — BASIC METABOLIC PANEL WITH GFR
BUN: 10 mg/dL (ref 7–25)
CO2: 26 mmol/L (ref 20–31)
CREATININE: 0.99 mg/dL (ref 0.50–0.99)
Calcium: 9.5 mg/dL (ref 8.6–10.4)
Chloride: 105 mmol/L (ref 98–110)
GFR, EST NON AFRICAN AMERICAN: 61 mL/min (ref >=60)
GFR, Est African American: 71 mL/min (ref >=60)
GLUCOSE: 96 mg/dL (ref 65–99)
Potassium: 4 mmol/L (ref 3.5–5.3)
Sodium: 141 mmol/L (ref 135–146)

## 2014-12-12 LAB — HEPATIC FUNCTION PANEL
ALT: 7 U/L (ref 6–29)
AST: 24 U/L (ref 10–35)
Albumin: 4.4 g/dL (ref 3.6–5.1)
Alkaline Phosphatase: 110 U/L (ref 33–130)
BILIRUBIN INDIRECT: 0.6 mg/dL (ref 0.2–1.2)
Bilirubin, Direct: 0.1 mg/dL (ref <=0.2)
TOTAL PROTEIN: 6.8 g/dL (ref 6.1–8.1)
Total Bilirubin: 0.7 mg/dL (ref 0.2–1.2)

## 2014-12-12 MED ORDER — NALOXEGOL OXALATE 25 MG PO TABS: 25.0000 mg | ORAL_TABLET | Freq: Every day | ORAL | Status: DC

## 2014-12-12 NOTE — Progress Notes (Signed)
Subjective:    Patient ID: Teresa Vasquez, female    DOB: 02-Aug-1950, 64 y.o.   MRN: [id number]  HPI 64 y.o. WF with history of HTN, chol, anxiety, diverticulitis, s/p choley presents with continuing diarrhea. She was seen 10/03 for regular 3 month follow up, and complained of diarrhea/GERD worsening x 58month, worse for last year since choley by Dr. Renelda Loma. She had normal colonoscopy 2011. She had mild LLQ tenderness so with her history of diverticulitis, she was given cipro and flaygl, she has 1 day left, had normal labs. She had constipation, took miralax Monday/tuesday and MOM Wednesday morning and started with diarrhea. She presents today with continuing diarrhea, watery, 3 x a day and bilateral lower AB pain, she has had more nausea, states diarrhea worse with eating. Has started to do liquid diet. Prior to this she did have constipation, took miralax. She has had some chills, no fever, no dark black stool/mucus/blood. States she does feel the same as the diverticulitis before.   Blood pressure 128/70, pulse 102, temperature 97.9 F (36.6 C), temperature source Temporal, height 5\' 6"  (1.676 m), weight 196 lb (88.905 kg), SpO2 99 %.  Current Outpatient Prescriptions on File Prior to Visit  Medication Sig Dispense Refill  . albuterol (PROVENTIL HFA;VENTOLIN HFA) 108 (90 BASE) MCG/ACT inhaler Inhale 2 puffs into the lungs every 6 (six) hours as needed for wheezing or shortness of breath. 1 Inhaler 2  . ALPRAZolam (XANAX) 1 MG tablet TAKE 1/2 TO 1 TABLET TWICE A DAY 180 tablet 1  . aspirin 81 MG tablet Take 81 mg by mouth daily.     . Cholecalciferol (VITAMIN D PO) Take 5,000 Units by mouth. daily    . ciprofloxacin (CIPRO) 500 MG tablet Take 1 tablet (500 mg total) by mouth 2 (two) times daily. 14 tablet 0  . dicyclomine (BENTYL) 20 MG tablet Take 1 tablet (20 mg total) by mouth 3 (three) times daily as needed for spasms. 90 tablet 0  . fluticasone (FLONASE) 50 MCG/ACT nasal spray Place 2  sprays into both nostrils daily.    Marland Kitchen ibuprofen (MOTRIN IB) 200 MG tablet You can take 2-3 tablets every 6 hours as needed for pain. 30 tablet 0  . metroNIDAZOLE (FLAGYL) 500 MG tablet Take 1 tablet (500 mg total) by mouth 3 (three) times daily. 21 tablet 0  . montelukast (SINGULAIR) 10 MG tablet TAKE 1 TABLET BY MOUTH DAILY 30 tablet 99  . moxifloxacin (AVELOX) 400 MG tablet Take 1 tablet daily with food for infection 7 tablet 0  . omeprazole (PRILOSEC) 40 MG capsule TAKE 1 CAPSULE (40 MG TOTAL) BY MOUTH DAILY. 30 capsule 1   No current facility-administered medications on file prior to visit.   Past Medical History  Diagnosis Date  . Anxiety and depression   . Diverticulitis   . Irritable bowel syndrome   . Depression   . Hypertension   . Hyperlipidemia    Review of Systems  Constitutional: Positive for chills and fatigue. Negative for fever, diaphoresis, activity change, appetite change and unexpected weight change.  HENT: Negative.   Eyes: Negative.   Respiratory: Negative.   Cardiovascular: Negative.   Gastrointestinal: Positive for abdominal pain and diarrhea. Negative for nausea, vomiting, constipation and blood in stool.  Genitourinary: Negative.  Negative for dysuria, frequency and flank pain.  Musculoskeletal: Negative.   Skin: Negative.   Neurological: Negative.   Psychiatric/Behavioral: Negative for suicidal ideas and hallucinations. The patient is nervous/anxious.  Objective:   Physical Exam  Constitutional: She is oriented to person, place, and time. She appears well-developed and well-nourished.  Neck: Normal range of motion. Neck supple.  Cardiovascular: Normal rate and regular rhythm.   Pulmonary/Chest: Effort normal and breath sounds normal.  Abdominal: Soft. Bowel sounds are normal. She exhibits no mass. There is tenderness in the right lower quadrant and left lower quadrant. There is guarding. There is no rigidity, no rebound, no tenderness at  McBurney's point and negative Murphy's sign.  Negative peritoneal signs  Neurological: She is alert and oriented to person, place, and time.  Skin: Skin is warm and dry. No rash noted.  Vitals reviewed.     Assessment & Plan:  Lower AB pain with diarrhea No peritoneal signs, vitals normal, just completed cipro/flagyl Will recheck labs, if CBC elevated will send in different ABX, suggest getting stool samples to rule out infection/Cdiff since prolonged diarrhea, could also be IBS with diarrhea component versus from [place]. Will give samples of movantik to try and suggest follow up with Dr. Renelda Loma If worsening symptoms go ER.

## 2014-12-12 NOTE — Addendum Note (Signed)
Addended by: Elsie Amis D on: 12/12/2014 04:17 PM   Modules accepted: Orders

## 2014-12-12 NOTE — Patient Instructions (Signed)
Diverticulitis °Diverticulitis is inflammation or infection of small pouches in your colon that form when you have a condition called diverticulosis. The pouches in your colon are called diverticula. Your colon, or large intestine, is where water is absorbed and stool is formed. °Complications of diverticulitis can include: °· Bleeding. °· Severe infection. °· Severe pain. °· Perforation of your colon. °· Obstruction of your colon. °CAUSES  °Diverticulitis is caused by bacteria. °Diverticulitis happens when stool becomes trapped in diverticula. This allows bacteria to grow in the diverticula, which can lead to inflammation and infection. °RISK FACTORS °People with diverticulosis are at risk for diverticulitis. Eating a diet that does not include enough fiber from fruits and vegetables may make diverticulitis more likely to develop. °SYMPTOMS  °Symptoms of diverticulitis may include: °· Abdominal pain and tenderness. The pain is normally located on the left side of the abdomen, but may occur in other areas. °· Fever and chills. °· Bloating. °· Cramping. °· Nausea. °· Vomiting. °· Constipation. °· Diarrhea. °· Blood in your stool. °DIAGNOSIS  °Your health care provider will ask you about your medical history and do a physical exam. You may need to have tests done because many medical conditions can cause the same symptoms as diverticulitis. Tests may include: °· Blood tests. °· Urine tests. °· Imaging tests of the abdomen, including X-rays and CT scans. °When your condition is under control, your health care provider may recommend that you have a colonoscopy. A colonoscopy can show how severe your diverticula are and whether something else is causing your symptoms. °TREATMENT  °Most cases of diverticulitis are mild and can be treated at home. Treatment may include: °· Taking over-the-counter pain medicines. °· Following a clear liquid diet. °· Taking antibiotic medicines by mouth for 7-10 days. °More severe cases may  be treated at a hospital. Treatment may include: °· Not eating or drinking. °· Taking prescription pain medicine. °· Receiving antibiotic medicines through an IV tube. °· Receiving fluids and nutrition through an IV tube. °· Surgery. °HOME CARE INSTRUCTIONS  °· Follow your health care provider's instructions carefully. °· Follow a full liquid diet or other diet as directed by your health care provider. After your symptoms improve, your health care provider may tell you to change your diet. He or she may recommend you eat a high-fiber diet. Fruits and vegetables are good sources of fiber. Fiber makes it easier to pass stool. °· Take fiber supplements or probiotics as directed by your health care provider. °· Only take medicines as directed by your health care provider. °· Keep all your follow-up appointments. °SEEK MEDICAL CARE IF:  °· Your pain does not improve. °· You have a hard time eating food. °· Your bowel movements do not return to normal. °SEEK IMMEDIATE MEDICAL CARE IF:  °· Your pain becomes worse. °· Your symptoms do not get better. °· Your symptoms suddenly get worse. °· You have a fever. °· You have repeated vomiting. °· You have bloody or black, tarry stools. °MAKE SURE YOU:  °· Understand these instructions. °· Will watch your condition. °· Will get help right away if you are not doing well or get worse. °  °This information is not intended to replace advice given to you by your health care provider. Make sure you discuss any questions you have with your health care provider. °  °Document Released: 11/28/2004 Document Revised: 02/23/2013 Document Reviewed: 01/13/2013 °[employer] Interactive Patient Education ©2016 [employer] ° °

## 2014-12-13 LAB — GASTROINTESTINAL PATHOGEN PANEL PCR
C. difficile Tox A/B, PCR: NEGATIVE
CRYPTOSPORIDIUM, PCR: NEGATIVE
Campylobacter, PCR: NEGATIVE
E COLI 0157, PCR: NEGATIVE
E coli (ETEC) LT/ST PCR: NEGATIVE
E coli (STEC) stx1/stx2, PCR: NEGATIVE
GIARDIA LAMBLIA, PCR: NEGATIVE
Norovirus, PCR: NEGATIVE
Rotavirus A, PCR: NEGATIVE
SHIGELLA, PCR: NEGATIVE
Salmonella, PCR: NEGATIVE

## 2014-12-19 ENCOUNTER — Ambulatory Visit (INDEPENDENT_AMBULATORY_CARE_PROVIDER_SITE_OTHER): Payer: BC Managed Care – PPO | Admitting: Gastroenterology

## 2014-12-19 ENCOUNTER — Encounter: Payer: Self-pay | Admitting: Gastroenterology

## 2014-12-19 ENCOUNTER — Other Ambulatory Visit (INDEPENDENT_AMBULATORY_CARE_PROVIDER_SITE_OTHER): Payer: BC Managed Care – PPO

## 2014-12-19 VITALS — BP 120/82 | HR 68 | Ht 66.5 in | Wt 196.2 lb

## 2014-12-19 DIAGNOSIS — K589 Irritable bowel syndrome without diarrhea: Secondary | ICD-10-CM

## 2014-12-19 DIAGNOSIS — Z9049 Acquired absence of other specified parts of digestive tract: Secondary | ICD-10-CM

## 2014-12-19 DIAGNOSIS — R140 Abdominal distension (gaseous): Secondary | ICD-10-CM | POA: Diagnosis not present

## 2014-12-19 LAB — IGA: IGA: 134 mg/dL (ref 68–378)

## 2014-12-19 NOTE — Patient Instructions (Addendum)
Please start Low FODMP diet.  Purchase Citrucel over the counter and begin taking daily.

## 2014-12-19 NOTE — Progress Notes (Signed)
HPI :  64 y/o female here for consultation for IBS. Former patient of Dr. Deatra Ina, last seen in 2012. She has a history of diverticulosis / diverticulitis. She reports she had a surgery to remove a segment of her colon for this in 2012.  She had a colonoscopy in 2011 which showed one hyperplastic polyp and diverticulosis, told to follow up in 10 years for repeat CRC Screening. She has had her GB removed last year.   She otherwise reports ongoing symptoms for what she has been told represents IBS. She reports she has food intolerances which can cause changes in her bowels, alternating diarrhea and constipation. She feels she worries more about the constipation than the diarrhea, as constipation makes her feel worse. She is roughly 50/50 in regards to which bothers her more. Certain foods can precipitate a bowel movement shortly within eating. Any greasy / fatty foods can cause her symptoms. She denies any blood in the stools. She can have some lower abdominal discomfort which can come go, cramping feeling. After she has a BM it can take away from of the cramping. She also has a lot of bloating that can bother her. She has been given dicyclomine for cramps, given last week, which can help some for her symptoms. She has tried miralax in the past which can take some time to work for her, took only one dose per day, which she thinks did not help too much. She has not been on higher doses. She uses MOM to produce a stool if needed. No weight loss. She is otherwise eating okay. No routine vomiting or nausea.   She takes ibuprofen occasionally for stomach cramps.    Past Medical History  Diagnosis Date  . Anxiety and depression   . Diverticulitis   . Irritable bowel syndrome   . Depression   . Hypertension   . Hyperlipidemia      Past Surgical History  Procedure Laterality Date  . Tonsillectomy    . Vaginal hysterectomy    . Colon surgery  2012    Diverticulosis  . Cholecystectomy N/A 12/17/2013      Procedure: LAPAROSCOPIC CHOLECYSTECTOMY WITH INTRAOPERATIVE CHOLANGIOGRAM;  Surgeon: Fanny Skates, MD;  Location: [facility] OR;  Service: General;  Laterality: N/A;   Family History  Problem Relation Age of Onset  . Heart disease Father   . Heart disease Mother   . Hypertension Mother   . Osteoporosis Mother   . Heart attack Brother   . Heart disease Brother   . Irritable bowel syndrome Sister    Social History  Substance Use Topics  . Smoking status: Never Smoker   . Smokeless tobacco: Never Used  . Alcohol Use: Yes   Current Outpatient Prescriptions  Medication Sig Dispense Refill  . albuterol (PROVENTIL HFA;VENTOLIN HFA) 108 (90 BASE) MCG/ACT inhaler Inhale 2 puffs into the lungs every 6 (six) hours as needed for wheezing or shortness of breath. 1 Inhaler 2  . ALPRAZolam (XANAX) 1 MG tablet TAKE 1/2 TO 1 TABLET TWICE A DAY 180 tablet 1  . aspirin 81 MG tablet Take 81 mg by mouth daily.     . Cholecalciferol (VITAMIN D PO) Take 5,000 Units by mouth. daily    . dicyclomine (BENTYL) 20 MG tablet Take 1 tablet (20 mg total) by mouth 3 (three) times daily as needed for spasms. 90 tablet 0  . fluticasone (FLONASE) 50 MCG/ACT nasal spray Place 2 sprays into both nostrils daily.    Marland Kitchen ibuprofen (MOTRIN  IB) 200 MG tablet You can take 2-3 tablets every 6 hours as needed for pain. 30 tablet 0  . montelukast (SINGULAIR) 10 MG tablet TAKE 1 TABLET BY MOUTH DAILY 30 tablet 99  . omeprazole (PRILOSEC) 40 MG capsule TAKE 1 CAPSULE (40 MG TOTAL) BY MOUTH DAILY. 30 capsule 1   No current facility-administered medications for this visit.   Allergies  Allergen Reactions  . Atorvastatin   . Levaquin [Levofloxacin In D5w] Nausea Only     Review of Systems: All systems reviewed and negative except where noted in HPI.   Lab Results  Component Value Date   WBC 4.9 12/12/2014   HGB 14.3 12/12/2014   HCT 42.0 12/12/2014   MCV 89.9 12/12/2014   PLT 209 12/12/2014    Lab Results  Component  Value Date   ALT 7 12/12/2014   AST 24 12/12/2014   ALKPHOS 110 12/12/2014   BILITOT 0.7 12/12/2014    Lab Results  Component Value Date   CREATININE 0.99 12/12/2014   BUN 10 12/12/2014   NA 141 12/12/2014   K 4.0 12/12/2014   CL 105 12/12/2014   CO2 26 12/12/2014     Physical Exam: BP 120/82 mmHg  Pulse 68  Ht 5' 6.5" (1.689 m)  Wt 196 lb 4 oz (89.018 kg)  BMI 31.20 kg/m2 Constitutional: Pleasant,well-developed, female in no acute distress. HEENT: Normocephalic and atraumatic. Conjunctivae are normal. No scleral icterus. Neck supple.  Cardiovascular: Normal rate, regular rhythm.  Pulmonary/chest: Effort normal and breath sounds normal. No wheezing, rales or rhonchi. Abdominal: Soft, nondistended, mild lower abdominal TTP without rebound or guarding. Bowel sounds active throughout. There are no masses palpable. No hepatomegaly. Extremities: no edema Lymphadenopathy: No cervical adenopathy noted. Neurological: Alert and oriented to person place and time. Skin: Skin is warm and dry. No rashes noted. Psychiatric: Normal mood and affect. Behavior is normal.   ASSESSMENT AND PLAN: 64 y/o female with longstanding symptoms as described above, most c/w IBS, mixed type, perhaps with a component of bile salt diarrhea following cholecystectomy. No alarm symptoms or anemia, prior colonoscopy showed no evidence of IBD.   Moving forward given she appears to have a dietary component to her symptoms, I counseled her on a low FODMOP diet and provided a handout. Hopefully this can help some of her symptoms. She otherwise has alternating constipation and diarrhea, recommend a trial of fiber supplementation daily to see if this helps. In this light recommend Citrucel given it should not cause as much bloating as other fiber supplements. She can continue to use bentyl PRN which she just started using and seems to help. Otherwise recommend she avoid routine use of NSAIDs and that she should not  take this for her symptoms. I also offered her celiac lab testing given she has not had this yet, to ensure normal. Pending her course, may consider a trial of cholestyramine in the future however this may constipate her, and wish to make only a few changes at one time. We may also consider a course of Rifaximin. She agreed and can follow up as needed  Bulverde Cellar, MD [facility] Pager [phone]

## 2014-12-20 LAB — TISSUE TRANSGLUTAMINASE, IGA: Tissue Transglutaminase Ab, IgA: 1 U/mL (ref <4)

## 2014-12-22 ENCOUNTER — Telehealth: Payer: Self-pay | Admitting: Gastroenterology

## 2014-12-22 NOTE — Telephone Encounter (Signed)
See result note.  

## 2015-01-12 ENCOUNTER — Telehealth: Payer: Self-pay | Admitting: Gastroenterology

## 2015-01-12 NOTE — Telephone Encounter (Signed)
Line busy - will try again later

## 2015-01-13 NOTE — Telephone Encounter (Signed)
Patient has followed FODMOP and tried Citrucel.

## 2015-01-13 NOTE — Telephone Encounter (Signed)
In clinic I had recommended a low FODMOP diet as well as using Citrucel daily. Not sure if she has tried Citrucel daily. Otherwise, yes, Miralax can be increased and titrated to effect which usually works. Thanks

## 2015-01-13 NOTE — Telephone Encounter (Signed)
Spoke with patient and she states she alternates diarrhea and constipation. She reports constipation recently and had only taken Miralax daily and MOM on occasion. Discussed with patient that she can increase Miralax to BID or even TID as needed. Tried to explain how to titrate dose. Patient stated "I have taken Miralax and MOM" and hung up on me.

## 2015-02-09 ENCOUNTER — Other Ambulatory Visit: Payer: Self-pay | Admitting: Internal Medicine

## 2015-02-27 ENCOUNTER — Encounter: Payer: Self-pay | Admitting: *Deleted

## 2015-04-17 ENCOUNTER — Other Ambulatory Visit: Payer: Self-pay | Admitting: Internal Medicine

## 2015-04-21 ENCOUNTER — Other Ambulatory Visit: Payer: Self-pay | Admitting: Physician Assistant

## 2015-04-21 MED ORDER — PREDNISONE 20 MG PO TABS: ORAL_TABLET | ORAL | Status: DC

## 2015-04-21 MED ORDER — AZITHROMYCIN 250 MG PO TABS: ORAL_TABLET | ORAL | Status: AC

## 2015-05-28 ENCOUNTER — Other Ambulatory Visit: Payer: Self-pay | Admitting: Internal Medicine

## 2015-05-30 ENCOUNTER — Other Ambulatory Visit: Payer: Self-pay | Admitting: Internal Medicine

## 2015-05-30 ENCOUNTER — Telehealth: Payer: Self-pay | Admitting: *Deleted

## 2015-05-30 MED ORDER — AZITHROMYCIN 250 MG PO TABS
ORAL_TABLET | ORAL | Status: DC
Start: 2015-05-30 — End: 2015-06-05

## 2015-05-30 NOTE — Telephone Encounter (Signed)
Patient called and complained of a cough, headache and congestion, not relieved by Claritin and Nasocort.  RX for a Z-Pak was sent in by Dr Melford Aase and he advised the patient to use OTC Delsym for the cough.  Patient is aware.

## 2015-06-02 ENCOUNTER — Telehealth: Payer: Self-pay | Admitting: *Deleted

## 2015-06-02 MED ORDER — ALBUTEROL SULFATE HFA 108 (90 BASE) MCG/ACT IN AERS: 2.0000 | INHALATION_SPRAY | Freq: Four times a day (QID) | RESPIRATORY_TRACT | Status: DC | PRN

## 2015-06-02 NOTE — Telephone Encounter (Signed)
Patient called and requested a refill on Albuterol for her nebulizer she has not used in 2 years.  Patient has had a Z-pak sent in earlier this week and still has a dry cough.  Per Dr Melford Aase, sent in an Albuterol inhaler refill and patient will need an OV if not better by Monday.

## 2015-06-05 ENCOUNTER — Ambulatory Visit (INDEPENDENT_AMBULATORY_CARE_PROVIDER_SITE_OTHER): Payer: BC Managed Care – PPO | Admitting: Physician Assistant

## 2015-06-05 ENCOUNTER — Encounter: Payer: Self-pay | Admitting: Physician Assistant

## 2015-06-05 VITALS — BP 126/72 | HR 82 | Temp 97.3°F | Resp 16 | Ht 66.5 in | Wt 192.4 lb

## 2015-06-05 DIAGNOSIS — J40 Bronchitis, not specified as acute or chronic: Secondary | ICD-10-CM

## 2015-06-05 DIAGNOSIS — J0100 Acute maxillary sinusitis, unspecified: Secondary | ICD-10-CM

## 2015-06-05 MED ORDER — AMOXICILLIN-POT CLAVULANATE 250-125 MG PO TABS: 1.0000 | ORAL_TABLET | Freq: Two times a day (BID) | ORAL | Status: DC

## 2015-06-05 MED ORDER — ALBUTEROL SULFATE (2.5 MG/3ML) 0.083% IN NEBU: 2.5000 mg | INHALATION_SOLUTION | Freq: Four times a day (QID) | RESPIRATORY_TRACT | Status: DC | PRN

## 2015-06-05 NOTE — Patient Instructions (Signed)

## 2015-06-05 NOTE — Progress Notes (Signed)
Subjective:    Patient ID: Teresa Vasquez, female    DOB: 1950/12/10, 65 y.o.   MRN: [id number]  HPI 65 y.o. WF with history of allergies presents with feeling sick. She is renovating a house and states due to dust/cleaners has had worsening wheezing. Has been sick since first week in March, has been taking OTC meds, feels she can not get a deep breath, coughing, headache. She took some prednisone and zpak on 03/28 and states she is still not feeling well. Denies CP, SOB, dizziness, changes in vision.   Blood pressure 126/72, pulse 82, temperature 97.3 F (36.3 C), temperature source Temporal, resp. rate 16, height 5' 6.5" (1.689 m), weight 192 lb 6.4 oz (87.272 kg), SpO2 98 %.  Past Medical History  Diagnosis Date  . Anxiety and depression   . Diverticulitis   . Irritable bowel syndrome   . Depression   . Hypertension   . Hyperlipidemia    Current Outpatient Prescriptions on File Prior to Visit  Medication Sig Dispense Refill  . albuterol (PROVENTIL HFA;VENTOLIN HFA) 108 (90 Base) MCG/ACT inhaler Inhale 2 puffs into the lungs every 6 (six) hours as needed for wheezing or shortness of breath. 1 Inhaler 0  . ALPRAZolam (XANAX) 1 MG tablet TAKE 1/2 TO 1 TABLET TWICE A DAY 180 tablet 1  . aspirin 81 MG tablet Take 81 mg by mouth daily.     . Cholecalciferol (VITAMIN D PO) Take 5,000 Units by mouth. daily    . dicyclomine (BENTYL) 20 MG tablet Take 1 tablet (20 mg total) by mouth 3 (three) times daily as needed for spasms. 90 tablet 0  . fluticasone (FLONASE) 50 MCG/ACT nasal spray PLACE 2 SPRAYS INTO EACH NOSTRIL EVERY DAY 16 g 99  . montelukast (SINGULAIR) 10 MG tablet TAKE 1 TABLET BY MOUTH DAILY 90 tablet 1  . omeprazole (PRILOSEC) 40 MG capsule TAKE 1 CAPSULE (40 MG TOTAL) BY MOUTH DAILY. 30 capsule 1  . predniSONE (DELTASONE) 20 MG tablet 2 tablets daily for 3 days, 1 tablet daily for 4 days. (Patient not taking: Reported on 06/05/2015) 10 tablet 0   No current  facility-administered medications on file prior to visit.    Review of Systems  Constitutional: Negative for chills and diaphoresis.  HENT: Positive for congestion, postnasal drip, sinus pressure and sneezing. Negative for ear pain and sore throat.   Respiratory: Positive for cough. Negative for chest tightness, shortness of breath and wheezing.   Cardiovascular: Negative.   Gastrointestinal: Negative.   Genitourinary: Negative.   Musculoskeletal: Negative for neck pain.  Neurological: Positive for headaches.       Objective:   Physical Exam  Constitutional: She appears well-developed and well-nourished.  HENT:  Head: Normocephalic and atraumatic.  Right Ear: External ear normal.  Nose: Right sinus exhibits maxillary sinus tenderness. Right sinus exhibits no frontal sinus tenderness. Left sinus exhibits maxillary sinus tenderness. Left sinus exhibits no frontal sinus tenderness.  Eyes: Conjunctivae and EOM are normal.  Neck: Normal range of motion. Neck supple.  Cardiovascular: Normal rate, regular rhythm, normal heart sounds and intact distal pulses.   Pulmonary/Chest: Effort normal and breath sounds normal. No respiratory distress. She has no wheezes.  Abdominal: Soft. Bowel sounds are normal.  Lymphadenopathy:    She has cervical adenopathy.  Skin: Skin is warm and dry.          Assessment & Plan:  Sinusitis Augmentin, continue singulair, flonase, allergy pill, sent in nebulizer solution increase fluids, rest  If not better will get CXR

## 2015-06-08 ENCOUNTER — Encounter: Payer: Self-pay | Admitting: Physician Assistant

## 2015-06-08 ENCOUNTER — Ambulatory Visit (INDEPENDENT_AMBULATORY_CARE_PROVIDER_SITE_OTHER): Payer: BC Managed Care – PPO | Admitting: Physician Assistant

## 2015-06-08 VITALS — BP 124/68 | HR 97 | Temp 97.0°F | Resp 16 | Ht 66.5 in | Wt 192.6 lb

## 2015-06-08 DIAGNOSIS — R350 Frequency of micturition: Secondary | ICD-10-CM | POA: Diagnosis not present

## 2015-06-08 MED ORDER — FLUCONAZOLE 150 MG PO TABS: 150.0000 mg | ORAL_TABLET | Freq: Every day | ORAL | Status: DC

## 2015-06-08 MED ORDER — SULFAMETHOXAZOLE-TRIMETHOPRIM 800-160 MG PO TABS: 1.0000 | ORAL_TABLET | Freq: Two times a day (BID) | ORAL | Status: DC

## 2015-06-08 NOTE — Progress Notes (Signed)
   Subjective:    Patient ID: Teresa Vasquez, female    DOB: Jan 22, 1951, 65 y.o.   MRN: [id number]  HPI 65 y.o. WF with urinary frequency x 1-2 weeks, getting worse, some bilateral lower AB pain/flank pain, some dysuria occ. Has been on augmentin for sinus infection. Denies vaginal discharge, occ itching.   Past Medical History  Diagnosis Date  . Anxiety and depression   . Diverticulitis   . Irritable bowel syndrome   . Depression   . Hypertension   . Hyperlipidemia    Current Outpatient Prescriptions on File Prior to Visit  Medication Sig Dispense Refill  . albuterol (PROVENTIL) (2.5 MG/3ML) 0.083% nebulizer solution Take 3 mLs (2.5 mg total) by nebulization every 6 (six) hours as needed for wheezing or shortness of breath. 75 mL 12  . ALPRAZolam (XANAX) 1 MG tablet TAKE 1/2 TO 1 TABLET TWICE A DAY 180 tablet 1  . aspirin 81 MG tablet Take 81 mg by mouth daily.     . Cholecalciferol (VITAMIN D PO) Take 5,000 Units by mouth. daily    . dicyclomine (BENTYL) 20 MG tablet Take 1 tablet (20 mg total) by mouth 3 (three) times daily as needed for spasms. 90 tablet 0  . fluticasone (FLONASE) 50 MCG/ACT nasal spray PLACE 2 SPRAYS INTO EACH NOSTRIL EVERY DAY 16 g 99  . montelukast (SINGULAIR) 10 MG tablet TAKE 1 TABLET BY MOUTH DAILY 90 tablet 1  . omeprazole (PRILOSEC) 40 MG capsule TAKE 1 CAPSULE (40 MG TOTAL) BY MOUTH DAILY. 30 capsule 1  . amoxicillin-clavulanate (AUGMENTIN) 250-125 MG tablet Take 1 tablet by mouth 2 (two) times daily. For 7 days. Take with food. (Patient not taking: Reported on 06/08/2015) 14 tablet 0  . predniSONE (DELTASONE) 20 MG tablet 2 tablets daily for 3 days, 1 tablet daily for 4 days. (Patient not taking: Reported on 06/08/2015) 10 tablet 0   No current facility-administered medications on file prior to visit.     Review of Systems  Constitutional: Negative for chills.  HENT: Negative.   Respiratory: Negative.   Cardiovascular: Negative.   Gastrointestinal:  Negative.  Negative for nausea and vomiting.  Genitourinary: Positive for dysuria, urgency and frequency. Negative for hematuria, flank pain, decreased urine volume, vaginal bleeding, vaginal discharge, enuresis, difficulty urinating, genital sores, menstrual problem, pelvic pain and dyspareunia.       Objective:   Physical Exam  Constitutional: She is oriented to person, place, and time. She appears well-developed and well-nourished.  Neck: Normal range of motion. Neck supple.  Cardiovascular: Normal rate and regular rhythm.   Pulmonary/Chest: Effort normal and breath sounds normal.  Abdominal: Soft. Bowel sounds are normal. She exhibits no distension and no mass. There is tenderness (suprapubic). There is no rebound and no guarding.  No CVA tenderness  Musculoskeletal: Normal range of motion. She exhibits no tenderness.  Neurological: She is alert and oriented to person, place, and time.  Skin: Skin is warm and dry.      Assessment & Plan:   1. Urinary frequency - Urinalysis, Routine w reflex microscopic (not at [facility]) - Urine culture - fluconazole (DIFLUCAN) 150 MG tablet; Take 1 tablet (150 mg total) by mouth daily.  Dispense: 1 tablet; Refill: 3 - sulfamethoxazole-trimethoprim (BACTRIM DS,SEPTRA DS) 800-160 MG tablet; Take 1 tablet by mouth 2 (two) times daily.  Dispense: 10 tablet; Refill: 0

## 2015-06-09 LAB — URINALYSIS, ROUTINE W REFLEX MICROSCOPIC
Bilirubin Urine: NEGATIVE
GLUCOSE, UA: NEGATIVE
HGB URINE DIPSTICK: NEGATIVE
KETONES UR: NEGATIVE
LEUKOCYTES UA: NEGATIVE
NITRITE: NEGATIVE
PH: 5.5 (ref 5.0–8.0)
PROTEIN: NEGATIVE
Specific Gravity, Urine: 1.011 (ref 1.001–1.035)

## 2015-06-09 LAB — URINE CULTURE
Colony Count: NO GROWTH
Organism ID, Bacteria: NO GROWTH

## 2015-07-05 ENCOUNTER — Ambulatory Visit (INDEPENDENT_AMBULATORY_CARE_PROVIDER_SITE_OTHER): Payer: BC Managed Care – PPO | Admitting: Physician Assistant

## 2015-07-05 ENCOUNTER — Encounter: Payer: Self-pay | Admitting: Physician Assistant

## 2015-07-05 VITALS — BP 122/66 | HR 88 | Temp 97.3°F | Resp 16 | Ht 66.6 in | Wt 193.4 lb

## 2015-07-05 DIAGNOSIS — R21 Rash and other nonspecific skin eruption: Secondary | ICD-10-CM | POA: Diagnosis not present

## 2015-07-05 LAB — BASIC METABOLIC PANEL WITH GFR
BUN: 13 mg/dL (ref 7–25)
CO2: 26 mmol/L (ref 20–31)
Calcium: 9.4 mg/dL (ref 8.6–10.4)
Chloride: 106 mmol/L (ref 98–110)
Creat: 0.96 mg/dL (ref 0.50–0.99)
GFR, EST AFRICAN AMERICAN: 73 mL/min (ref >=60)
GFR, EST NON AFRICAN AMERICAN: 64 mL/min (ref >=60)
GLUCOSE: 86 mg/dL (ref 65–99)
POTASSIUM: 3.8 mmol/L (ref 3.5–5.3)
Sodium: 142 mmol/L (ref 135–146)

## 2015-07-05 LAB — CBC WITH DIFFERENTIAL/PLATELET
BASOS PCT: 1 %
Basophils Absolute: 41 cells/uL (ref 0–200)
Eosinophils Absolute: 82 cells/uL (ref 15–500)
Eosinophils Relative: 2 %
HEMATOCRIT: 40.6 % (ref 35.0–45.0)
Hemoglobin: 13.4 g/dL (ref 11.7–15.5)
LYMPHS ABS: 1763 {cells}/uL (ref 850–3900)
LYMPHS PCT: 43 %
MCH: 30.5 pg (ref 27.0–33.0)
MCHC: 33 g/dL (ref 32.0–36.0)
MCV: 92.3 fL (ref 80.0–100.0)
MONOS PCT: 7 %
MPV: 10.5 fL (ref 7.5–12.5)
Monocytes Absolute: 287 cells/uL (ref 200–950)
NEUTROS ABS: 1927 {cells}/uL (ref 1500–7800)
NEUTROS PCT: 47 %
PLATELETS: 191 10*3/uL (ref 140–400)
RBC: 4.4 MIL/uL (ref 3.80–5.10)
RDW: 13.5 % (ref 11.0–15.0)
WBC: 4.1 10*3/uL (ref 3.8–10.8)

## 2015-07-05 LAB — HEPATIC FUNCTION PANEL
ALK PHOS: 101 U/L (ref 33–130)
ALT: 6 U/L (ref 6–29)
AST: 13 U/L (ref 10–35)
Albumin: 4.2 g/dL (ref 3.6–5.1)
BILIRUBIN INDIRECT: 0.4 mg/dL (ref 0.2–1.2)
Bilirubin, Direct: 0.1 mg/dL (ref <=0.2)
TOTAL PROTEIN: 6.6 g/dL (ref 6.1–8.1)
Total Bilirubin: 0.5 mg/dL (ref 0.2–1.2)

## 2015-07-05 LAB — TSH: TSH: 7.61 mIU/L — ABNORMAL HIGH

## 2015-07-05 MED ORDER — HYDROXYZINE HCL 25 MG PO TABS: 25.0000 mg | ORAL_TABLET | Freq: Every evening | ORAL | Status: DC | PRN

## 2015-07-05 MED ORDER — TRIAMCINOLONE ACETONIDE 0.5 % EX CREA: 1.0000 "application " | TOPICAL_CREAM | Freq: Two times a day (BID) | CUTANEOUS | Status: DC

## 2015-07-05 NOTE — Progress Notes (Signed)
   Subjective:    Patient ID: Teresa Vasquez, female    DOB: 22-Nov-1950, 65 y.o.   MRN: [id number]  HPI 65 y.o. WF presents with left hand and left foot rash, pruritic, no pain, has been renovating a house and working with chemicals/out in the yard.   Blood pressure 122/66, temperature 97.3 F (36.3 C), temperature source Temporal, resp. rate 16, height 5' 6.6" (1.692 m).  Past Medical History  Diagnosis Date  . Anxiety and depression   . Diverticulitis   . Irritable bowel syndrome   . Depression   . Hypertension   . Hyperlipidemia    Current Outpatient Prescriptions on File Prior to Visit  Medication Sig Dispense Refill  . albuterol (PROVENTIL) (2.5 MG/3ML) 0.083% nebulizer solution Take 3 mLs (2.5 mg total) by nebulization every 6 (six) hours as needed for wheezing or shortness of breath. 75 mL 12  . ALPRAZolam (XANAX) 1 MG tablet TAKE 1/2 TO 1 TABLET TWICE A DAY 180 tablet 1  . aspirin 81 MG tablet Take 81 mg by mouth daily.     . Cholecalciferol (VITAMIN D PO) Take 5,000 Units by mouth. daily    . dicyclomine (BENTYL) 20 MG tablet Take 1 tablet (20 mg total) by mouth 3 (three) times daily as needed for spasms. 90 tablet 0  . fluticasone (FLONASE) 50 MCG/ACT nasal spray PLACE 2 SPRAYS INTO EACH NOSTRIL EVERY DAY 16 g 99  . montelukast (SINGULAIR) 10 MG tablet TAKE 1 TABLET BY MOUTH DAILY 90 tablet 1  . omeprazole (PRILOSEC) 40 MG capsule TAKE 1 CAPSULE (40 MG TOTAL) BY MOUTH DAILY. 30 capsule 1  . sulfamethoxazole-trimethoprim (BACTRIM DS,SEPTRA DS) 800-160 MG tablet Take 1 tablet by mouth 2 (two) times daily. 10 tablet 0   No current facility-administered medications on file prior to visit.    Review of Systems  Constitutional: Positive for chills and fatigue. Negative for fever, diaphoresis, activity change, appetite change and unexpected weight change.  HENT: Negative.   Respiratory: Negative.   Cardiovascular: Negative.   Gastrointestinal: Negative.  Negative for  diarrhea.  Genitourinary: Negative.   Musculoskeletal: Negative.  Negative for arthralgias.  Skin: Positive for rash. Negative for color change, pallor and wound.  Neurological: Negative.  Negative for dizziness.       Objective:   Physical Exam  Constitutional: She appears well-developed and well-nourished.  Neck: Neck supple.  Cardiovascular: Normal rate and regular rhythm.   No murmur heard. Pulmonary/Chest: Effort normal and breath sounds normal. She has no wheezes.  Musculoskeletal: Normal range of motion.  Neurological: She is alert. No cranial nerve deficit.  Skin: Rash noted.  Healing rash along left arm, some healing and some new linear vesicles along left wrist, some small erythematous vesicles on left ankle. States bilateral hand and feet with some mild erythema and stinging.        Assessment & Plan:  1. Rash and nonspecific skin eruption No pain with it, since some redness hand and feet will get labs otherwise treat topically - CBC with Differential/Platelet - BASIC METABOLIC PANEL WITH GFR - Hepatic function panel - TSH - Lyme Aby, Wstrn. Blt. IgG & IgM w/bands - [place] mtn spotted fvr abs pnl(IgG+IgM) - RPR - triamcinolone cream (KENALOG) 0.5 %; Apply 1 application topically 2 (two) times daily.  Dispense: 80 g; Refill: 2 - hydrOXYzine (ATARAX/VISTARIL) 25 MG tablet; Take 1 tablet (25 mg total) by mouth at bedtime as needed for itching.  Dispense: 60 tablet; Refill: 0

## 2015-07-05 NOTE — Patient Instructions (Signed)
Contact Dermatitis Dermatitis is redness, soreness, and swelling (inflammation) of the skin. Contact dermatitis is a reaction to certain substances that touch the skin. There are two types of contact dermatitis:   Irritant contact dermatitis. This type is caused by something that irritates your skin, such as dry hands from washing them too much. This type does not require previous exposure to the substance for a reaction to occur. This type is more common.  Allergic contact dermatitis. This type is caused by a substance that you are allergic to, such as a nickel allergy or poison ivy. This type only occurs if you have been exposed to the substance (allergen) before. Upon a repeat exposure, your body reacts to the substance. This type is less common. CAUSES  Many different substances can cause contact dermatitis. Irritant contact dermatitis is most commonly caused by exposure to:   Makeup.   Soaps.   Detergents.   Bleaches.   Acids.   Metal salts, such as nickel.  Allergic contact dermatitis is most commonly caused by exposure to:   Poisonous plants.   Chemicals.   Jewelry.   Latex.   Medicines.   Preservatives in products, such as clothing.  RISK FACTORS This condition is more likely to develop in:   People who have jobs that expose them to irritants or allergens.  People who have certain medical conditions, such as asthma or eczema.  SYMPTOMS  Symptoms of this condition may occur anywhere on your body where the irritant has touched you or is touched by you. Symptoms include:  Dryness or flaking.   Redness.   Cracks.   Itching.   Pain or a burning feeling.   Blisters.  Drainage of small amounts of blood or clear fluid from skin cracks. With allergic contact dermatitis, there may also be swelling in areas such as the eyelids, mouth, or genitals.  DIAGNOSIS  This condition is diagnosed with a medical history and physical exam. A patch skin test  may be performed to help determine the cause. If the condition is related to your job, you may need to see an occupational medicine specialist. TREATMENT Treatment for this condition includes figuring out what caused the reaction and protecting your skin from further contact. Treatment may also include:   Steroid creams or ointments. Oral steroid medicines may be needed in more severe cases.  Antibiotics or antibacterial ointments, if a skin infection is present.  Antihistamine lotion or an antihistamine taken by mouth to ease itching.  A bandage (dressing). HOME CARE INSTRUCTIONS Skin Care  Moisturize your skin as needed.   Apply cool compresses to the affected areas.  Try taking a bath with:  Epsom salts. Follow the instructions on the packaging. You can get these at your local pharmacy or grocery store.  Baking soda. Pour a small amount into the bath as directed by your health care provider.  Colloidal oatmeal. Follow the instructions on the packaging. You can get this at your local pharmacy or grocery store.  Try applying baking soda paste to your skin. Stir water into baking soda until it reaches a paste-like consistency.  Do not scratch your skin.  Bathe less frequently, such as every other day.  Bathe in lukewarm water. Avoid using hot water. Medicines  Take or apply over-the-counter and prescription medicines only as told by your health care provider.   If you were prescribed an antibiotic medicine, take or apply your antibiotic as told by your health care provider. Do not stop using the   antibiotic even if your condition starts to improve. General Instructions  Keep all follow-up visits as told by your health care provider. This is important.  Avoid the substance that caused your reaction. If you do not know what caused it, keep a journal to try to track what caused it. Write down:  What you eat.  What cosmetic products you use.  What you drink.  What  you wear in the affected area. This includes jewelry.  If you were given a dressing, take care of it as told by your health care provider. This includes when to change and remove it. SEEK MEDICAL CARE IF:   Your condition does not improve with treatment.  Your condition gets worse.  You have signs of infection such as swelling, tenderness, redness, soreness, or warmth in the affected area.  You have a fever.  You have new symptoms. SEEK IMMEDIATE MEDICAL CARE IF:   You have a severe headache, neck pain, or neck stiffness.  You vomit.  You feel very sleepy.  You notice red streaks coming from the affected area.  Your bone or joint underneath the affected area becomes painful after the skin has healed.  The affected area turns darker.  You have difficulty breathing.   This information is not intended to replace advice given to you by your health care provider. Make sure you discuss any questions you have with your health care provider.   Document Released: 02/16/2000 Document Revised: 11/09/2014 Document Reviewed: 07/06/2014 Elsevier Interactive Patient Education 2016 [employer]  

## 2015-07-06 LAB — LYME ABY, WSTRN BLT IGG & IGM W/BANDS
B BURGDORFERI IGM ABS (IB): NEGATIVE
B burgdorferi IgG Abs (IB): NEGATIVE
LYME DISEASE 23 KD IGG: NONREACTIVE
LYME DISEASE 23 KD IGM: NONREACTIVE
LYME DISEASE 39 KD IGM: NONREACTIVE
LYME DISEASE 41 KD IGG: NONREACTIVE
LYME DISEASE 45 KD IGG: NONREACTIVE
LYME DISEASE 58 KD IGG: NONREACTIVE
LYME DISEASE 66 KD IGG: NONREACTIVE
LYME DISEASE 93 KD IGG: NONREACTIVE
Lyme Disease 18 kD IgG: NONREACTIVE
Lyme Disease 28 kD IgG: REACTIVE — AB
Lyme Disease 30 kD IgG: NONREACTIVE
Lyme Disease 39 kD IgG: NONREACTIVE
Lyme Disease 41 kD IgM: NONREACTIVE

## 2015-07-06 LAB — RPR

## 2015-07-07 LAB — ROCKY MTN SPOTTED FVR ABS PNL(IGG+IGM)
RMSF IGG: NOT DETECTED
RMSF IGM: NOT DETECTED

## 2015-07-21 ENCOUNTER — Other Ambulatory Visit: Payer: Self-pay | Admitting: Physician Assistant

## 2015-07-21 DIAGNOSIS — F419 Anxiety disorder, unspecified: Secondary | ICD-10-CM

## 2015-07-24 NOTE — Telephone Encounter (Signed)
RX CALLED INTO [facility]. 

## 2015-09-25 ENCOUNTER — Other Ambulatory Visit: Payer: Self-pay | Admitting: Physician Assistant

## 2015-09-25 DIAGNOSIS — R21 Rash and other nonspecific skin eruption: Secondary | ICD-10-CM

## 2015-09-28 ENCOUNTER — Encounter: Payer: Self-pay | Admitting: Internal Medicine

## 2015-10-31 ENCOUNTER — Other Ambulatory Visit: Payer: Self-pay | Admitting: *Deleted

## 2015-10-31 DIAGNOSIS — R21 Rash and other nonspecific skin eruption: Secondary | ICD-10-CM

## 2015-10-31 MED ORDER — HYDROXYZINE HCL 25 MG PO TABS: 25.0000 mg | ORAL_TABLET | Freq: Every evening | ORAL | 0 refills | Status: DC | PRN

## 2015-11-07 ENCOUNTER — Other Ambulatory Visit: Payer: Self-pay | Admitting: *Deleted

## 2015-11-07 DIAGNOSIS — R21 Rash and other nonspecific skin eruption: Secondary | ICD-10-CM

## 2015-11-08 ENCOUNTER — Telehealth: Payer: Self-pay | Admitting: *Deleted

## 2015-11-08 NOTE — Telephone Encounter (Signed)
Spoke with [facility] [phone]) and informed them that we are trying to contact the patient to schedule her OV appointment and no refills until she schedules the appointment.

## 2016-01-08 ENCOUNTER — Ambulatory Visit (INDEPENDENT_AMBULATORY_CARE_PROVIDER_SITE_OTHER): Payer: BC Managed Care – PPO | Admitting: Internal Medicine

## 2016-01-08 ENCOUNTER — Encounter: Payer: Self-pay | Admitting: Internal Medicine

## 2016-01-08 VITALS — BP 120/68 | HR 92 | Temp 98.0°F | Resp 18 | Ht 66.25 in | Wt 200.0 lb

## 2016-01-08 DIAGNOSIS — F411 Generalized anxiety disorder: Secondary | ICD-10-CM

## 2016-01-08 DIAGNOSIS — Z79899 Other long term (current) drug therapy: Secondary | ICD-10-CM

## 2016-01-08 DIAGNOSIS — Z0001 Encounter for general adult medical examination with abnormal findings: Secondary | ICD-10-CM

## 2016-01-08 DIAGNOSIS — Z130 Encounter for screening for diseases of the blood and blood-forming organs and certain disorders involving the immune mechanism: Secondary | ICD-10-CM

## 2016-01-08 DIAGNOSIS — K589 Irritable bowel syndrome without diarrhea: Secondary | ICD-10-CM

## 2016-01-08 DIAGNOSIS — Z0000 Encounter for general adult medical examination without abnormal findings: Secondary | ICD-10-CM | POA: Diagnosis not present

## 2016-01-08 DIAGNOSIS — F419 Anxiety disorder, unspecified: Secondary | ICD-10-CM

## 2016-01-08 DIAGNOSIS — E559 Vitamin D deficiency, unspecified: Secondary | ICD-10-CM

## 2016-01-08 DIAGNOSIS — R030 Elevated blood-pressure reading, without diagnosis of hypertension: Secondary | ICD-10-CM

## 2016-01-08 DIAGNOSIS — Z136 Encounter for screening for cardiovascular disorders: Secondary | ICD-10-CM | POA: Diagnosis not present

## 2016-01-08 DIAGNOSIS — E782 Mixed hyperlipidemia: Secondary | ICD-10-CM

## 2016-01-08 DIAGNOSIS — F329 Major depressive disorder, single episode, unspecified: Secondary | ICD-10-CM

## 2016-01-08 DIAGNOSIS — R7309 Other abnormal glucose: Secondary | ICD-10-CM

## 2016-01-08 DIAGNOSIS — Z1159 Encounter for screening for other viral diseases: Secondary | ICD-10-CM

## 2016-01-08 LAB — CBC WITH DIFFERENTIAL/PLATELET
BASOS ABS: 0 {cells}/uL (ref 0–200)
BASOS PCT: 0 %
EOS PCT: 2 %
Eosinophils Absolute: 116 cells/uL (ref 15–500)
HCT: 42.6 % (ref 35.0–45.0)
HEMOGLOBIN: 14.2 g/dL (ref 11.7–15.5)
LYMPHS ABS: 1276 {cells}/uL (ref 850–3900)
Lymphocytes Relative: 22 %
MCH: 30.7 pg (ref 27.0–33.0)
MCHC: 33.3 g/dL (ref 32.0–36.0)
MCV: 92 fL (ref 80.0–100.0)
MONOS PCT: 7 %
MPV: 10.9 fL (ref 7.5–12.5)
Monocytes Absolute: 406 cells/uL (ref 200–950)
NEUTROS ABS: 4002 {cells}/uL (ref 1500–7800)
Neutrophils Relative %: 69 %
PLATELETS: 207 10*3/uL (ref 140–400)
RBC: 4.63 MIL/uL (ref 3.80–5.10)
RDW: 13.6 % (ref 11.0–15.0)
WBC: 5.8 10*3/uL (ref 3.8–10.8)

## 2016-01-08 LAB — HEMOGLOBIN A1C
HEMOGLOBIN A1C: 5.2 % (ref <5.7)
MEAN PLASMA GLUCOSE: 103 mg/dL

## 2016-01-08 LAB — TSH: TSH: 3.71 m[IU]/L

## 2016-01-08 LAB — VITAMIN B12: VITAMIN B 12: 691 pg/mL (ref 200–1100)

## 2016-01-08 MED ORDER — ALPRAZOLAM 1 MG PO TABS: ORAL_TABLET | ORAL | 1 refills | Status: DC

## 2016-01-08 NOTE — Progress Notes (Signed)
Complete Physical  Assessment and Plan:   1. Encounter for general adult medical examination with abnormal findings  - CBC with Differential/Platelet - BASIC METABOLIC PANEL WITH GFR - Hepatic function panel - Magnesium  2. Elevated blood pressure reading without diagnosis of hypertension  - Urinalysis, Routine w reflex microscopic (not at [facility]) - Microalbumin / creatinine urine ratio - EKG 12-Lead - TSH  3. Mixed hyperlipidemia -cont diet and exercise -consider adding in zetia - Lipid panel  4. Other abnormal glucose -cont diet and exercise - Hemoglobin A1c - Insulin, random  5. Vitamin D deficiency  - VITAMIN D 25 Hydroxy (Vit-D Deficiency, Fractures)  6. Anxiety state -cont xanax  7. Major depressive disorder with single episode, remission status unspecified -cont xanax prn -avoid use if possible -not wanting SSRI  8. Irritable bowel syndrome, unspecified type -avoid trigger foods -control stress levels -immodium prn  9. Screening for deficiency anemia  - Iron and TIBC - Vitamin B12  10. Need for hepatitis C screening test  - Hepatitis C Antibody  11. Chronic anxiety  - ALPRAZolam (XANAX) 1 MG tablet; Take 1/2 to 1 tablet 2 x day if needed for anxiety  Dispense: 180 tablet; Refill: 1   Discussed med's effects and SE's. Screening labs and tests as requested with regular follow-up as recommended.  HPI  65 y.o. female  presents for a complete physical.  Her blood pressure has been controlled at home, today their BP is BP: 120/68.  She does not workout. She denies chest pain, shortness of breath, dizziness.   She is not on cholesterol medication and denies myalgias. Her cholesterol is not at goal. The cholesterol last visit was:  Lab Results  Component Value Date   CHOL 248 (H) 12/05/2014   HDL 31 (L) 12/05/2014   LDLCALC 168 (H) 12/05/2014   TRIG 245 (H) 12/05/2014   CHOLHDL 8.0 (H) 12/05/2014  .  She hasn't been working on diet and  exercise for prediabetes, she is on bASA, she is not on ACE/ARB and denies foot ulcerations, hyperglycemia, hypoglycemia , increased appetite, nausea, paresthesia of the feet, polydipsia, polyuria, visual disturbances, vomiting and weight loss. Last A1C in the office was:  Lab Results  Component Value Date   HGBA1C 5.7 (H) 12/05/2014    Patient is on Vitamin D supplement.   Lab Results  Component Value Date   VD25OH 33 12/05/2014     Patient reports that she has been taking xanax mostly to help her sleep at nighttime.  She reports that she is having some personal stuff going on, but they are addressing it.    She reports that her right knee is having some soreness ro the right leg after walking into a platform bed.  She had some swelling and bruising.   She is following with Dr. Harrington Challenger.  She reports that she is going to see Dr. Deatra Ina.  She reports that she is going to do a mammogram and pelvic exam on the same day.  Her last visit was 4 years ago.    Current Medications:  Current Outpatient Prescriptions on File Prior to Visit  Medication Sig Dispense Refill  . ALPRAZolam (XANAX) 1 MG tablet Take 1/2 to 1 tablet 2 x day if needed for anxiety 180 tablet 1  . aspirin 81 MG tablet Take 81 mg by mouth daily.     . Cholecalciferol (VITAMIN D PO) Take 5,000 Units by mouth. daily    . fluticasone (FLONASE) 50 MCG/ACT nasal spray  PLACE 2 SPRAYS INTO EACH NOSTRIL EVERY DAY 16 g 99   No current facility-administered medications on file prior to visit.     Health Maintenance:   Immunization History  Administered Date(s) Administered  . Tdap 08/27/2012    Tetanus: 2014 Flu vaccine: Due today [id number] MGM: 2013  Colonoscopy: 2011  Patient Care Team: Unk Pinto, MD as PCP - General (Internal Medicine) Rozetta Nunnery, MD as Consulting Physician (Otolaryngology) Thayer Headings, MD as Consulting Physician (Cardiology) Inda Castle, MD as Consulting  Physician (Gastroenterology) Gus Height, MD as Consulting Physician (Obstetrics and Gynecology)  Allergies:  Allergies  Allergen Reactions  . Atorvastatin   . Levaquin [Levofloxacin In D5w] Nausea Only    Medical History:  Past Medical History:  Diagnosis Date  . Anxiety and depression   . Depression   . Diverticulitis   . Hyperlipidemia   . Hypertension   . Irritable bowel syndrome     Surgical History:  Past Surgical History:  Procedure Laterality Date  . CHOLECYSTECTOMY N/A 12/17/2013   Procedure: LAPAROSCOPIC CHOLECYSTECTOMY WITH INTRAOPERATIVE CHOLANGIOGRAM;  Surgeon: Fanny Skates, MD;  Location: [facility];  Service: General;  Laterality: N/A;  . COLON SURGERY  2012   Diverticulosis  . TONSILLECTOMY    . VAGINAL HYSTERECTOMY      Family History:  Family History  Problem Relation Age of Onset  . Heart disease Father   . Heart disease Mother   . Hypertension Mother   . Osteoporosis Mother   . Heart attack Brother   . Heart disease Brother   . Irritable bowel syndrome Sister     Social History:  Social History  Substance Use Topics  . Smoking status: Never Smoker  . Smokeless tobacco: Never Used  . Alcohol use Yes    Review of Systems: Review of Systems  Constitutional: Negative for chills, fever and malaise/fatigue.  HENT: Negative for congestion, ear pain and sore throat.   Eyes: Negative.   Respiratory: Negative for cough, shortness of breath and wheezing.   Cardiovascular: Negative for chest pain, palpitations and leg swelling.  Gastrointestinal: Negative for abdominal pain, blood in stool, constipation, diarrhea, heartburn and melena.  Genitourinary: Negative.   Skin: Negative.   Neurological: Negative for dizziness, sensory change, loss of consciousness and headaches.  Psychiatric/Behavioral: Negative for depression. The patient is not nervous/anxious and does not have insomnia.     Physical Exam: Estimated body mass index is 32.04 kg/m as  calculated from the following:   Height as of this encounter: 5' 6.25" (1.683 m).   Weight as of this encounter: 200 lb (90.7 kg). BP 120/68   Pulse 92   Temp 98 F (36.7 C) (Temporal)   Resp 18   Ht 5' 6.25" (1.683 m)   Wt 200 lb (90.7 kg)   BMI 32.04 kg/m   General Appearance: Well nourished well developed, in no apparent distress.  Eyes: PERRLA, EOMs, conjunctiva no swelling or erythema ENT/Mouth: Ear canals normal without obstruction, swelling, erythema, or discharge.  TMs normal bilaterally with no erythema, bulging, retraction, or loss of landmark.  Oropharynx moist and clear with no exudate, erythema, or swelling.   Neck: Supple, thyroid normal. No bruits.  No cervical adenopathy Respiratory: Respiratory effort normal, Breath sounds clear A&P without wheeze, rhonchi, rales.   Cardio: RRR without murmurs, rubs or gallops. Brisk peripheral pulses without edema.  Chest: symmetric, with normal excursions Breasts: Symmetric, without lumps, nipple discharge, retractions.  Abdomen: Soft, nontender, no guarding, rebound,  hernias, masses, or organomegaly.  Lymphatics: Non tender without lymphadenopathy.  Musculoskeletal: Full ROM all peripheral extremities,5/5 strength, and normal gait.  Skin: Warm, dry without rashes, lesions, ecchymosis. Neuro: Awake and oriented X 3, Cranial nerves intact, reflexes equal bilaterally. Normal muscle tone, no cerebellar symptoms. Sensation intact.  Psych:  normal affect, Insight and Judgment appropriate.   EKG: WNL no changes.  Over 40 minutes of exam, counseling, chart review and critical decision making was performed  Starlyn Skeans 2:28 PM [facility]

## 2016-01-09 LAB — URINALYSIS, ROUTINE W REFLEX MICROSCOPIC
BILIRUBIN URINE: NEGATIVE
Glucose, UA: NEGATIVE
Hgb urine dipstick: NEGATIVE
KETONES UR: NEGATIVE
Leukocytes, UA: NEGATIVE
Nitrite: NEGATIVE
PH: 5.5 (ref 5.0–8.0)
Protein, ur: NEGATIVE
SPECIFIC GRAVITY, URINE: 1.017 (ref 1.001–1.035)

## 2016-01-09 LAB — IRON AND TIBC
%SAT: 23 % (ref 11–50)
Iron: 77 ug/dL (ref 45–160)
TIBC: 336 ug/dL (ref 250–450)
UIBC: 259 ug/dL (ref 125–400)

## 2016-01-09 LAB — HEPATIC FUNCTION PANEL
ALK PHOS: 101 U/L (ref 33–130)
ALT: 6 U/L (ref 6–29)
AST: 17 U/L (ref 10–35)
Albumin: 4.6 g/dL (ref 3.6–5.1)
BILIRUBIN DIRECT: 0.1 mg/dL (ref <=0.2)
BILIRUBIN INDIRECT: 0.4 mg/dL (ref 0.2–1.2)
BILIRUBIN TOTAL: 0.5 mg/dL (ref 0.2–1.2)
Total Protein: 7.1 g/dL (ref 6.1–8.1)

## 2016-01-09 LAB — VITAMIN D 25 HYDROXY (VIT D DEFICIENCY, FRACTURES): VIT D 25 HYDROXY: 37 ng/mL (ref 30–100)

## 2016-01-09 LAB — INSULIN, RANDOM: Insulin: 4.7 u[IU]/mL (ref 2.0–19.6)

## 2016-01-09 LAB — BASIC METABOLIC PANEL WITH GFR
BUN: 12 mg/dL (ref 7–25)
CHLORIDE: 103 mmol/L (ref 98–110)
CO2: 28 mmol/L (ref 20–31)
CREATININE: 0.86 mg/dL (ref 0.50–0.99)
Calcium: 9.8 mg/dL (ref 8.6–10.4)
GFR, EST NON AFRICAN AMERICAN: 72 mL/min (ref >=60)
GFR, Est African American: 83 mL/min (ref >=60)
Glucose, Bld: 84 mg/dL (ref 65–99)
POTASSIUM: 4.2 mmol/L (ref 3.5–5.3)
SODIUM: 141 mmol/L (ref 135–146)

## 2016-01-09 LAB — LIPID PANEL
CHOL/HDL RATIO: 7.6 ratio — AB (ref <5.0)
Cholesterol: 273 mg/dL — ABNORMAL HIGH (ref <200)
HDL: 36 mg/dL — AB (ref >50)
LDL CALC: 195 mg/dL — AB
Triglycerides: 212 mg/dL — ABNORMAL HIGH (ref <150)
VLDL: 42 mg/dL — AB (ref <30)

## 2016-01-09 LAB — HEPATITIS C ANTIBODY: HCV AB: NEGATIVE

## 2016-01-09 LAB — MICROALBUMIN / CREATININE URINE RATIO
Creatinine, Urine: 142 mg/dL (ref 20–320)
MICROALB UR: 0.5 mg/dL
Microalb Creat Ratio: 4 mcg/mg creat (ref <30)

## 2016-01-09 LAB — MAGNESIUM: MAGNESIUM: 2 mg/dL (ref 1.5–2.5)

## 2016-01-11 ENCOUNTER — Encounter: Payer: Self-pay | Admitting: Internal Medicine

## 2016-01-11 ENCOUNTER — Other Ambulatory Visit: Payer: Self-pay | Admitting: *Deleted

## 2016-01-11 MED ORDER — EZETIMIBE 10 MG PO TABS: 10.0000 mg | ORAL_TABLET | Freq: Every day | ORAL | 3 refills | Status: DC

## 2016-01-12 ENCOUNTER — Encounter: Payer: Self-pay | Admitting: Physician Assistant

## 2016-01-13 ENCOUNTER — Other Ambulatory Visit: Payer: Self-pay | Admitting: Physician Assistant

## 2016-01-13 MED ORDER — ATORVASTATIN CALCIUM 20 MG PO TABS: 20.0000 mg | ORAL_TABLET | Freq: Every day | ORAL | 3 refills | Status: DC

## 2016-01-21 ENCOUNTER — Other Ambulatory Visit: Payer: Self-pay | Admitting: Internal Medicine

## 2016-02-14 ENCOUNTER — Encounter: Payer: Self-pay | Admitting: Physician Assistant

## 2016-02-14 ENCOUNTER — Ambulatory Visit (INDEPENDENT_AMBULATORY_CARE_PROVIDER_SITE_OTHER): Payer: BC Managed Care – PPO | Admitting: Physician Assistant

## 2016-02-14 VITALS — BP 118/74 | HR 95 | Temp 97.5°F | Resp 16 | Ht 66.25 in | Wt 205.0 lb

## 2016-02-14 DIAGNOSIS — J0100 Acute maxillary sinusitis, unspecified: Secondary | ICD-10-CM

## 2016-02-14 MED ORDER — PREDNISONE 20 MG PO TABS: ORAL_TABLET | ORAL | 0 refills | Status: DC

## 2016-02-14 MED ORDER — BENZONATATE 100 MG PO CAPS: 200.0000 mg | ORAL_CAPSULE | Freq: Three times a day (TID) | ORAL | 0 refills | Status: DC | PRN

## 2016-02-14 MED ORDER — PROMETHAZINE-CODEINE 6.25-10 MG/5ML PO SYRP: 5.0000 mL | ORAL_SOLUTION | Freq: Four times a day (QID) | ORAL | 0 refills | Status: DC | PRN

## 2016-02-14 MED ORDER — AMOXICILLIN-POT CLAVULANATE 875-125 MG PO TABS: 1.0000 | ORAL_TABLET | Freq: Two times a day (BID) | ORAL | 0 refills | Status: DC

## 2016-02-14 NOTE — Patient Instructions (Signed)

## 2016-02-14 NOTE — Progress Notes (Signed)
Subjective:    Patient ID: Teresa Vasquez, female    DOB: 10/24/1950, 65 y.o.   MRN: [id number]  HPI Patient also complains  of symptoms of a URI, possible sinusitis. Symptoms include bilateral ear pressure/pain, congestion, facial pain, nasal congestion, no  fever, non productive cough, post nasal drip and sinus pressure. Onset of symptoms was 2 weeks ago, and has been gradually worsening since that time. Treatment to date: antihistamines and nasal steroids.  Blood pressure 118/74, pulse 95, temperature 97.5 F (36.4 C), resp. rate 16, height 5' 6.25" (1.683 m), weight 205 lb (93 kg), SpO2 98 %.  Medications Current Outpatient Prescriptions on File Prior to Visit  Medication Sig  . ALPRAZolam (XANAX) 1 MG tablet Take 1/2 to 1 tablet 2 x day if needed for anxiety  . aspirin 81 MG tablet Take 81 mg by mouth daily.   Marland Kitchen atorvastatin (LIPITOR) 20 MG tablet Take 1 tablet (20 mg total) by mouth daily.  . Cholecalciferol (VITAMIN D PO) Take 5,000 Units by mouth. daily  . fluticasone (FLONASE) 50 MCG/ACT nasal spray PLACE 2 SPRAYS INTO EACH NOSTRIL EVERY DAY  . montelukast (SINGULAIR) 10 MG tablet TAKE 1 TABLET BY MOUTH DAILY   No current facility-administered medications on file prior to visit.     Problem list She has Anxiety state; Depression; DIVERTICULITIS; IRRITABLE BOWEL SYNDROME; Allergic rhinitis; Vitamin D deficiency; Elevated blood pressure reading without diagnosis of hypertension; Other abnormal glucose; Mixed hyperlipidemia; Symptomatic cholelithiasis; and Cholecystitis, acute with cholelithiasis on her problem list.   Review of Systems  Constitutional: Negative for chills and diaphoresis.  HENT: Positive for congestion, postnasal drip, sinus pressure and sneezing. Negative for ear pain and sore throat.   Respiratory: Positive for cough. Negative for choking, chest tightness, shortness of breath, wheezing and stridor.   Cardiovascular: Negative.   Gastrointestinal: Negative.    Genitourinary: Negative.   Musculoskeletal: Negative for neck pain.  Neurological: Positive for headaches.       Objective:   Physical Exam  Constitutional: She appears well-developed and well-nourished.  HENT:  Head: Normocephalic and atraumatic.  Right Ear: External ear normal.  Nose: Right sinus exhibits maxillary sinus tenderness. Right sinus exhibits no frontal sinus tenderness. Left sinus exhibits maxillary sinus tenderness. Left sinus exhibits no frontal sinus tenderness.  Eyes: Conjunctivae and EOM are normal.  Neck: Normal range of motion. Neck supple.  Cardiovascular: Normal rate, regular rhythm, normal heart sounds and intact distal pulses.   Pulmonary/Chest: Effort normal and breath sounds normal. No respiratory distress. She has no wheezes.  Abdominal: Soft. Bowel sounds are normal.  Lymphadenopathy:    She has cervical adenopathy.  Skin: Skin is warm and dry.       Assessment & Plan:  1. Acute maxillary sinusitis, recurrence not specified - promethazine-codeine (PHENERGAN WITH CODEINE) 6.25-10 MG/5ML syrup; Take 5 mLs by mouth every 6 (six) hours as needed for cough. Max: 71mL per day  Dispense: 240 mL; Refill: 0 - amoxicillin-clavulanate (AUGMENTIN) 875-125 MG tablet; Take 1 tablet by mouth 2 (two) times daily. 7 days  Dispense: 14 tablet; Refill: 0 - predniSONE (DELTASONE) 20 MG tablet; 2 tablets daily for 3 days, 1 tablet daily for 4 days.  Dispense: 10 tablet; Refill: 0 - benzonatate (TESSALON PERLES) 100 MG capsule; Take 2 capsules (200 mg total) by mouth 3 (three) times daily as needed for cough (Max: 600mg  per day).  Dispense: 60 capsule; Refill: 0  If any neck pain, worsening fever, chills call office or  go to ER

## 2016-02-16 ENCOUNTER — Encounter: Payer: Self-pay | Admitting: Physician Assistant

## 2016-02-16 ENCOUNTER — Other Ambulatory Visit: Payer: Self-pay | Admitting: Physician Assistant

## 2016-03-12 ENCOUNTER — Other Ambulatory Visit: Payer: Self-pay

## 2016-03-12 MED ORDER — ATORVASTATIN CALCIUM 20 MG PO TABS: 20.0000 mg | ORAL_TABLET | Freq: Every day | ORAL | 1 refills | Status: DC

## 2016-03-23 ENCOUNTER — Other Ambulatory Visit: Payer: Self-pay | Admitting: Internal Medicine

## 2016-03-28 ENCOUNTER — Other Ambulatory Visit: Payer: Self-pay | Admitting: Internal Medicine

## 2016-04-25 ENCOUNTER — Other Ambulatory Visit: Payer: Self-pay | Admitting: Orthopedic Surgery

## 2016-04-25 DIAGNOSIS — S83203A Other tear of unspecified meniscus, current injury, right knee, initial encounter: Secondary | ICD-10-CM

## 2016-04-28 ENCOUNTER — Ambulatory Visit
Admission: RE | Admit: 2016-04-28 | Discharge: 2016-04-28 | Disposition: A | Discharge status: Home / Self Care | Payer: BC Managed Care – PPO | Source: Ambulatory Visit | Attending: Orthopedic Surgery | Admitting: Orthopedic Surgery

## 2016-04-28 DIAGNOSIS — S83203A Other tear of unspecified meniscus, current injury, right knee, initial encounter: Secondary | ICD-10-CM

## 2016-06-11 ENCOUNTER — Other Ambulatory Visit: Payer: Self-pay | Admitting: Physician Assistant

## 2016-06-11 DIAGNOSIS — R350 Frequency of micturition: Secondary | ICD-10-CM

## 2016-06-21 ENCOUNTER — Other Ambulatory Visit: Payer: Self-pay | Admitting: *Deleted

## 2016-06-21 MED ORDER — MONTELUKAST SODIUM 10 MG PO TABS: 10.0000 mg | ORAL_TABLET | Freq: Every day | ORAL | 1 refills | Status: DC

## 2016-07-10 ENCOUNTER — Telehealth: Payer: Self-pay | Admitting: *Deleted

## 2016-07-10 DIAGNOSIS — F419 Anxiety disorder, unspecified: Secondary | ICD-10-CM

## 2016-07-10 MED ORDER — ALPRAZOLAM 1 MG PO TABS: ORAL_TABLET | ORAL | 1 refills | Status: AC

## 2016-07-10 NOTE — Telephone Encounter (Signed)
OK to refill Alprazolam 1 mg, per Dr Melford Aase.

## 2016-07-17 ENCOUNTER — Ambulatory Visit: Payer: Self-pay | Admitting: Internal Medicine

## 2016-07-30 ENCOUNTER — Encounter: Payer: Self-pay | Admitting: Physician Assistant

## 2016-07-30 ENCOUNTER — Ambulatory Visit (INDEPENDENT_AMBULATORY_CARE_PROVIDER_SITE_OTHER): Payer: BC Managed Care – PPO | Admitting: Physician Assistant

## 2016-07-30 VITALS — BP 120/84 | HR 96 | Temp 98.1°F | Resp 14 | Ht 66.25 in | Wt 206.0 lb

## 2016-07-30 DIAGNOSIS — F329 Major depressive disorder, single episode, unspecified: Secondary | ICD-10-CM | POA: Diagnosis not present

## 2016-07-30 DIAGNOSIS — R1032 Left lower quadrant pain: Secondary | ICD-10-CM | POA: Diagnosis not present

## 2016-07-30 DIAGNOSIS — E559 Vitamin D deficiency, unspecified: Secondary | ICD-10-CM

## 2016-07-30 DIAGNOSIS — R7309 Other abnormal glucose: Secondary | ICD-10-CM

## 2016-07-30 DIAGNOSIS — E782 Mixed hyperlipidemia: Secondary | ICD-10-CM | POA: Diagnosis not present

## 2016-07-30 DIAGNOSIS — R030 Elevated blood-pressure reading, without diagnosis of hypertension: Secondary | ICD-10-CM

## 2016-07-30 DIAGNOSIS — Z79899 Other long term (current) drug therapy: Secondary | ICD-10-CM

## 2016-07-30 LAB — CBC WITH DIFFERENTIAL/PLATELET
BASOS ABS: 0 {cells}/uL (ref 0–200)
Basophils Relative: 0 %
EOS ABS: 174 {cells}/uL (ref 15–500)
Eosinophils Relative: 3 %
HCT: 41.8 % (ref 35.0–45.0)
Hemoglobin: 13.8 g/dL (ref 11.7–15.5)
LYMPHS PCT: 28 %
Lymphs Abs: 1624 cells/uL (ref 850–3900)
MCH: 30.4 pg (ref 27.0–33.0)
MCHC: 33 g/dL (ref 32.0–36.0)
MCV: 92.1 fL (ref 80.0–100.0)
MONOS PCT: 8 %
MPV: 10.7 fL (ref 7.5–12.5)
Monocytes Absolute: 464 cells/uL (ref 200–950)
NEUTROS PCT: 61 %
Neutro Abs: 3538 cells/uL (ref 1500–7800)
PLATELETS: 210 10*3/uL (ref 140–400)
RBC: 4.54 MIL/uL (ref 3.80–5.10)
RDW: 13.1 % (ref 11.0–15.0)
WBC: 5.8 10*3/uL (ref 3.8–10.8)

## 2016-07-30 MED ORDER — METRONIDAZOLE 500 MG PO TABS: 500.0000 mg | ORAL_TABLET | Freq: Three times a day (TID) | ORAL | 0 refills | Status: AC

## 2016-07-30 MED ORDER — CIPROFLOXACIN HCL 500 MG PO TABS: 500.0000 mg | ORAL_TABLET | Freq: Two times a day (BID) | ORAL | 0 refills | Status: DC

## 2016-07-30 NOTE — Patient Instructions (Addendum)
Can be diverticulitis Bowel rest with liquids, bland food Will start on cipro/flaygl just in case it is diverticulitis  If worse symptoms go to ER  Can also be back pain/sciatica, check out exercises   Sciatica Sciatica is pain, numbness, weakness, or tingling along the path of the sciatic nerve. The sciatic nerve starts in the lower back and runs down the back of each leg. The nerve controls the muscles in the lower leg and in the back of the knee. It also provides feeling (sensation) to the back of the thigh, the lower leg, and the sole of the foot. Sciatica is a symptom of another medical condition that pinches or puts pressure on the sciatic nerve. Generally, sciatica only affects one side of the body. Sciatica usually goes away on its own or with treatment. In some cases, sciatica may keep coming back (recur). What are the causes? This condition is caused by pressure on the sciatic nerve, or pinching of the sciatic nerve. This may be the result of:  A disk in between the bones of the spine (vertebrae) bulging out too far (herniated disk).  Age-related changes in the spinal disks (degenerative disk disease).  A pain disorder that affects a muscle in the buttock (piriformis syndrome).  Extra bone growth (bone spur) near the sciatic nerve.  An injury or break (fracture) of the pelvis.  Pregnancy.  Tumor (rare). What increases the risk? The following factors may make you more likely to develop this condition:  Playing sports that place pressure or stress on the spine, such as football or weight lifting.  Having poor strength and flexibility.  A history of back injury.  A history of back surgery.  Sitting for long periods of time.  Doing activities that involve repetitive bending or lifting.  Obesity. What are the signs or symptoms? Symptoms can vary from mild to very severe, and they may include:  Any of these problems in the lower back, leg, hip, or buttock:  Mild  tingling or dull aches.  Burning sensations.  Sharp pains.  Numbness in the back of the calf or the sole of the foot.  Leg weakness.  Severe back pain that makes movement difficult. These symptoms may get worse when you cough, sneeze, or laugh, or when you sit or stand for long periods of time. Being overweight may also make symptoms worse. In some cases, symptoms may recur over time. How is this diagnosed? This condition may be diagnosed based on:  Your symptoms.  A physical exam. Your health care provider may ask you to do certain movements to check whether those movements trigger your symptoms.  You may have tests, including:  Blood tests.  X-rays.  MRI.  CT scan. How is this treated? In many cases, this condition improves on its own, without any treatment. However, treatment may include:  Reducing or modifying physical activity during periods of pain.  Exercising and stretching to strengthen your abdomen and improve the flexibility of your spine.  Icing and applying heat to the affected area.  Medicines that help:  To relieve pain and swelling.  To relax your muscles.  Injections of medicines that help to relieve pain, irritation, and inflammation around the sciatic nerve (steroids).  Surgery. Follow these instructions at home: Medicines   Take over-the-counter and prescription medicines only as told by your health care provider.  Do not drive or operate heavy machinery while taking prescription pain medicine. Managing pain   If directed, apply ice to the affected  area.  Put ice in a plastic bag.  Place a towel between your skin and the bag.  Leave the ice on for 20 minutes, 2-3 times a day.  After icing, apply heat to the affected area before you exercise or as often as told by your health care provider. Use the heat source that your health care provider recommends, such as a moist heat pack or a heating pad.  Place a towel between your skin and  the heat source.  Leave the heat on for 20-30 minutes.  Remove the heat if your skin turns bright red. This is especially important if you are unable to feel pain, heat, or cold. You may have a greater risk of getting burned. Activity   Return to your normal activities as told by your health care provider. Ask your health care provider what activities are safe for you.  Avoid activities that make your symptoms worse.  Take brief periods of rest throughout the day. Resting in a lying or standing position is usually better than sitting to rest.  When you rest for longer periods, mix in some mild activity or stretching between periods of rest. This will help to prevent stiffness and pain.  Avoid sitting for long periods of time without moving. Get up and move around at least one time each hour.  Exercise and stretch regularly, as told by your health care provider.  Do not lift anything that is heavier than 10 lb (4.5 kg) while you have symptoms of sciatica. When you do not have symptoms, you should still avoid heavy lifting, especially repetitive heavy lifting.  When you lift objects, always use proper lifting technique, which includes:  Bending your knees.  Keeping the load close to your body.  Avoiding twisting. General instructions   Use good posture.  Avoid leaning forward while sitting.  Avoid hunching over while standing.  Maintain a healthy weight. Excess weight puts extra stress on your back and makes it difficult to maintain good posture.  Wear supportive, comfortable shoes. Avoid wearing high heels.  Avoid sleeping on a mattress that is too soft or too hard. A mattress that is firm enough to support your back when you sleep may help to reduce your pain.  Keep all follow-up visits as told by your health care provider. This is important. Contact a health care provider if:  You have pain that wakes you up when you are sleeping.  You have pain that gets worse when  you lie down.  Your pain is worse than you have experienced in the past.  Your pain lasts longer than 4 weeks.  You experience unexplained weight loss. Get help right away if:  You lose control of your bowel or bladder (incontinence).  You have:  Weakness in your lower back, pelvis, buttocks, or legs that gets worse.  Redness or swelling of your back.  A burning sensation when you urinate. This information is not intended to replace advice given to you by your health care provider. Make sure you discuss any questions you have with your health care provider. Document Released: 02/12/2001 Document Revised: 07/25/2015 Document Reviewed: 10/28/2014 Elsevier Interactive Patient Education  2017 [place].   Sciatica Rehab Ask your health care provider which exercises are safe for you. Do exercises exactly as told by your health care provider and adjust them as directed. It is normal to feel mild stretching, pulling, tightness, or discomfort as you do these exercises, but you should stop right away if you  feel sudden pain or your pain gets worse.Do not begin these exercises until told by your health care provider. Stretching and range of motion exercises These exercises warm up your muscles and joints and improve the movement and flexibility of your hips and your back. These exercises also help to relieve pain, numbness, and tingling. Exercise A: Sciatic nerve glide  1. Sit in a chair with your head facing down toward your chest. Place your hands behind your back. Let your shoulders slump forward. 2. Slowly straighten one of your knees while you tilt your head back as if you are looking toward the ceiling. Only straighten your leg as far as you can without making your symptoms worse. 3. Hold for __________ seconds. 4. Slowly return to the starting position. 5. Repeat with your other leg. Repeat __________ times. Complete this exercise __________ times a day. Exercise B: Knee to chest  with hip adduction and internal rotation   1. Lie on your back on a firm surface with both legs straight. 2. Bend one of your knees and move it up toward your chest until you feel a gentle stretch in your lower back and buttock. Then, move your knee toward the shoulder that is on the opposite side from your leg.  Hold your leg in this position by holding onto the front of your knee. 3. Hold for __________ seconds. 4. Slowly return to the starting position. 5. Repeat with your other leg. Repeat __________ times. Complete this exercise __________ times a day. Exercise C: Prone extension on elbows   1. Lie on your abdomen on a firm surface. A bed may be too soft for this exercise. 2. Prop yourself up on your elbows. 3. Use your arms to help lift your chest up until you feel a gentle stretch in your abdomen and your lower back.  This will place some of your body weight on your elbows. If this is uncomfortable, try stacking pillows under your chest.  Your hips should stay down, against the surface that you are lying on. Keep your hip and back muscles relaxed. 4. Hold for __________ seconds. 5. Slowly relax your upper body and return to the starting position. Repeat __________ times. Complete this exercise __________ times a day. Strengthening exercises These exercises build strength and endurance in your back. Endurance is the ability to use your muscles for a long time, even after they get tired. Exercise D: Pelvic tilt  1. Lie on your back on a firm surface. Bend your knees and keep your feet flat. 2. Tense your abdominal muscles. Tip your pelvis up toward the ceiling and flatten your lower back into the floor.  To help with this exercise, you may place a small towel under your lower back and try to push your back into the towel. 3. Hold for __________ seconds. 4. Let your muscles relax completely before you repeat this exercise. Repeat __________ times. Complete this exercise __________  times a day. Exercise E: Alternating arm and leg raises   1. Get on your hands and knees on a firm surface. If you are on a hard floor, you may want to use padding to cushion your knees, such as an exercise mat. 2. Line up your arms and legs. Your hands should be below your shoulders, and your knees should be below your hips. 3. Lift your left leg behind you. At the same time, raise your right arm and straighten it in front of you.  Do not lift your leg higher  than your hip.  Do not lift your arm higher than your shoulder.  Keep your abdominal and back muscles tight.  Keep your hips facing the ground.  Do not arch your back.  Keep your balance carefully, and do not hold your breath. 4. Hold for __________ seconds. 5. Slowly return to the starting position and repeat with your right leg and your left arm. Repeat __________ times. Complete this exercise __________ times a day. Posture and body mechanics   Body mechanics refers to the movements and positions of your body while you do your daily activities. Posture is part of body mechanics. Good posture and healthy body mechanics can help to relieve stress in your body's tissues and joints. Good posture means that your spine is in its natural S-curve position (your spine is neutral), your shoulders are pulled back slightly, and your head is not tipped forward. The following are general guidelines for applying improved posture and body mechanics to your everyday activities. Standing    When standing, keep your spine neutral and your feet about hip-width apart. Keep a slight bend in your knees. Your ears, shoulders, and hips should line up.  When you do a task in which you stand in one place for a long time, place one foot up on a stable object that is 2-4 inches (5-10 cm) high, such as a footstool. This helps keep your spine neutral. Sitting    When sitting, keep your spine neutral and keep your feet flat on the floor. Use a footrest, if  necessary, and keep your thighs parallel to the floor. Avoid rounding your shoulders, and avoid tilting your head forward.  When working at a desk or a computer, keep your desk at a height where your hands are slightly lower than your elbows. Slide your chair under your desk so you are close enough to maintain good posture.  When working at a computer, place your monitor at a height where you are looking straight ahead and you do not have to tilt your head forward or downward to look at the screen. Resting    When lying down and resting, avoid positions that are most painful for you.  If you have pain with activities such as sitting, bending, stooping, or squatting (flexion-based activities), lie in a position in which your body does not bend very much. For example, avoid curling up on your side with your arms and knees near your chest (fetal position).  If you have pain with activities such as standing for a long time or reaching with your arms (extension-based activities), lie with your spine in a neutral position and bend your knees slightly. Try the following positions:  Lying on your side with a pillow between your knees.  Lying on your back with a pillow under your knees. Lifting    When lifting objects, keep your feet at least shoulder-width apart and tighten your abdominal muscles.  Bend your knees and hips and keep your spine neutral. It is important to lift using the strength of your legs, not your back. Do not lock your knees straight out.  Always ask for help to lift heavy or awkward objects. This information is not intended to replace advice given to you by your health care provider. Make sure you discuss any questions you have with your health care provider. Document Released: 02/18/2005 Document Revised: 10/26/2015 Document Reviewed: 11/04/2014 Elsevier Interactive Patient Education  2017 [employer]    Diverticulitis Diverticulitis is inflammation or infection of  small pouches in your colon that form when you have a condition called diverticulosis. The pouches in your colon are called diverticula. Your colon, or large intestine, is where water is absorbed and stool is formed. Complications of diverticulitis can include:  Bleeding.  Severe infection.  Severe pain.  Perforation of your colon.  Obstruction of your colon. What are the causes? Diverticulitis is caused by bacteria. Diverticulitis happens when stool becomes trapped in diverticula. This allows bacteria to grow in the diverticula, which can lead to inflammation and infection. What increases the risk? People with diverticulosis are at risk for diverticulitis. Eating a diet that does not include enough fiber from fruits and vegetables may make diverticulitis more likely to develop. What are the signs or symptoms? Symptoms of diverticulitis may include:  Abdominal pain and tenderness. The pain is normally located on the left side of the abdomen, but may occur in other areas.  Fever and chills.  Bloating.  Cramping.  Nausea.  Vomiting.  Constipation.  Diarrhea.  Blood in your stool. How is this diagnosed? Your health care provider will ask you about your medical history and do a physical exam. You may need to have tests done because many medical conditions can cause the same symptoms as diverticulitis. Tests may include:  Blood tests.  Urine tests.  Imaging tests of the abdomen, including X-rays and CT scans. When your condition is under control, your health care provider may recommend that you have a colonoscopy. A colonoscopy can show how severe your diverticula are and whether something else is causing your symptoms. How is this treated? Most cases of diverticulitis are mild and can be treated at home. Treatment may include:  Taking over-the-counter pain medicines.  Following a clear liquid diet.  Taking antibiotic medicines by mouth for 7-10 days. More severe  cases may be treated at a hospital. Treatment may include:  Not eating or drinking.  Taking prescription pain medicine.  Receiving antibiotic medicines through an IV tube.  Receiving fluids and nutrition through an IV tube.  Surgery. Follow these instructions at home:  Follow your health care provider's instructions carefully.  Follow a full liquid diet or other diet as directed by your health care provider. After your symptoms improve, your health care provider may tell you to change your diet. He or she may recommend you eat a high-fiber diet. Fruits and vegetables are good sources of fiber. Fiber makes it easier to pass stool.  Take fiber supplements or probiotics as directed by your health care provider.  Only take medicines as directed by your health care provider.  Keep all your follow-up appointments. Contact a health care provider if:  Your pain does not improve.  You have a hard time eating food.  Your bowel movements do not return to normal. Get help right away if:  Your pain becomes worse.  Your symptoms do not get better.  Your symptoms suddenly get worse.  You have a fever.  You have repeated vomiting.  You have bloody or black, tarry stools. This information is not intended to replace advice given to you by your health care provider. Make sure you discuss any questions you have with your health care provider. Document Released: 11/28/2004 Document Revised: 07/27/2015 Document Reviewed: 01/13/2013 Elsevier Interactive Patient Education  2017 [employer].   Simple math prevails.    1st - exercise does not produce significant weight loss - at best one converts fat into muscle , "bulks up", loses inches, but usually stays "weight  neutral"     2nd - think of your body weightas a check book: If you eat more calories than you burn up - you save money or gain weight .... Or if you spend more money than you put in the check book, ie burn up more calories than  you eat, then you lose weight     3rd - if you walk or run 1 mile, you burn up 100 calories - you have to burn up 3,500 calories to lose 1 pound, ie you have to walk/run 35 miles to lose 1 measly pound. So if you want to lose 10 #, then you have to walk/run 350 miles, so.... clearly exercise is not the solution.     4. So if you consume 1,500 calories, then you have to burn up the equivalent of 15 miles to stay weight neutral - It also stands to reason that if you consume 1,500 cal/day and don't lose weight, then you must be burning up about 1,500 cals/day to stay weight neutral.     5. If you really want to lose weight, you must cut your calorie intake 300 calories /day and at that rate you should lose about 1 # every 3 days.   6. Please purchase Dr Fara Olden Fuhrman's book(s) "The End of Dieting" & "Eat to Live" . It has some great concepts and recipes.

## 2016-07-30 NOTE — Progress Notes (Signed)
Assessment and Plan:   Hypertension -Continue medication, monitor blood pressure at home. Continue DASH diet.  Reminder to go to the ER if any CP, SOB, nausea, dizziness, severe HA, changes vision/speech, left arm numbness and tingling and jaw pain.   Cholesterol -Continue diet and exercise. Check cholesterol.   Obesity with co morbidities - long discussion about weight loss, diet, and exercise  Vitamin D Def - check level and continue medications.   LLQ pain ? diverticulitis versus SI joint versus kidney stone Will treat with cipro and flagyl Get labs Bowel rest Get CT scan, patient wants to wait until Monday/Tuesday Go to ER if worse   Continue diet and meds as discussed. Further disposition pending results of labs. Over 30 minutes of exam, counseling, chart review, and critical decision making was performed Future Appointments Date Time Provider [place]  01/07/2017 3:00 PM Vicie Mutters, PA-C [id number] None    HPI 66 y.o. female  presents for 3 month follow up on hypertension, cholesterol, prediabetes, and vitamin D deficiency.   Her blood pressure has been controlled at home, today their BP is BP: 120/84  She does not workout. She denies chest pain, shortness of breath, dizziness. Had knee surgery after failing conservative treatemtn in April, Dr. Tamera Punt, had arthroscopic surgery for "too much cartilage" but states she has not felt good since Feb.  She has been having left lower back pain x 2 weeks that radiates around to her hip, has had nausea x 2 days, woke her up this AM, has decreased appetite, has had some diarrhea. S/p colon sugery for diverticulitis and s/p choley.   She is on cholesterol medication, started atorvastatin last visit and denies myalgias. Her cholesterol is not at goal. The cholesterol last visit was:   Lab Results  Component Value Date   CHOL 273 (H) 01/08/2016   HDL 36 (L) 01/08/2016   LDLCALC 195 (H) 01/08/2016   TRIG 212 (H)  01/08/2016   CHOLHDL 7.6 (H) 01/08/2016   Last A1C in the office was:  Lab Results  Component Value Date   HGBA1C 5.2 01/08/2016   Patient is on Vitamin D supplement.   Lab Results  Component Value Date   VD25OH 37 01/08/2016     BMI is Body mass index is 33 kg/m., she is working on diet and exercise. Wt Readings from Last 3 Encounters:  07/30/16 206 lb (93.4 kg)  02/14/16 205 lb (93 kg)  01/08/16 200 lb (90.7 kg)    Current Medications:  Current Outpatient Prescriptions on File Prior to Visit  Medication Sig Dispense Refill  . ALPRAZolam (XANAX) 1 MG tablet Take 1/2 to 1 tablet 2 x day if needed for anxiety 180 tablet 1  . aspirin 81 MG tablet Take 81 mg by mouth daily.     Marland Kitchen atorvastatin (LIPITOR) 20 MG tablet Take 1 tablet (20 mg total) by mouth daily. 90 tablet 1  . fluticasone (FLONASE) 50 MCG/ACT nasal spray USE 2 SPRAYS INTO EACH NOSTRIL ONCE DAILY 48 g 1  . montelukast (SINGULAIR) 10 MG tablet Take 1 tablet (10 mg total) by mouth daily. 90 tablet 1  . omeprazole (PRILOSEC) 40 MG capsule TAKE ONE CAPSULE EVERY DAY 90 capsule 1   No current facility-administered medications on file prior to visit.    Medical History:  Past Medical History:  Diagnosis Date  . Anxiety and depression   . Depression   . Diverticulitis   . Hyperlipidemia   . Hypertension   .  Irritable bowel syndrome    Allergies:  Allergies  Allergen Reactions  . Atorvastatin   . Levaquin [Levofloxacin In D5w] Nausea Only     Review of Systems:  Review of Systems  Constitutional: Negative.   HENT: Negative.   Eyes: Negative.   Respiratory: Negative.   Cardiovascular: Negative.   Gastrointestinal: Positive for abdominal pain, diarrhea and heartburn. Negative for blood in stool, constipation, melena, nausea and vomiting.  Genitourinary: Negative.   Musculoskeletal: Negative.   Skin: Negative.   Neurological: Negative.   Psychiatric/Behavioral: Negative for depression, hallucinations,  memory loss, substance abuse and suicidal ideas. The patient is nervous/anxious. The patient does not have insomnia.     Family history- Review and unchanged Social history- Review and unchanged Physical Exam: BP 120/84   Pulse 96   Temp 98.1 F (36.7 C)   Resp 14   Ht 5' 6.25" (1.683 m)   Wt 206 lb (93.4 kg)   SpO2 97%   BMI 33.00 kg/m  Wt Readings from Last 3 Encounters:  07/30/16 206 lb (93.4 kg)  02/14/16 205 lb (93 kg)  01/08/16 200 lb (90.7 kg)   General Appearance: Well nourished, in no apparent distress. Eyes: PERRLA, EOMs, conjunctiva no swelling or erythema Sinuses: No Frontal/maxillary tenderness ENT/Mouth: Ext aud canals clear, TMs without erythema, bulging. No erythema, swelling, or exudate on post pharynx.  Tonsils not swollen or erythematous. Hearing normal.  Neck: Supple, thyroid normal.  Respiratory: Respiratory effort normal, BS equal bilaterally without rales, rhonchi, wheezing or stridor.  Cardio: RRR with no MRGs. Brisk peripheral pulses without edema.  Abdomen: Soft, + BS, LLQ tenderness, no guarding, rebound, hernias, masses. Lymphatics: Non tender without lymphadenopathy.  Musculoskeletal: Full ROM, 5/5 strength, Normal gait Skin: Warm, dry without rashes, lesions, ecchymosis.  Neuro: Cranial nerves intact. Normal muscle tone, no cerebellar symptoms. Psych: Awake and oriented X 3, normal affect, Insight and Judgment appropriate.    Vicie Mutters, PA-C 5:27 PM [facility]

## 2016-07-31 ENCOUNTER — Encounter: Payer: Self-pay | Admitting: Physician Assistant

## 2016-07-31 LAB — LIPID PANEL
Cholesterol: 161 mg/dL (ref <200)
HDL: 37 mg/dL — AB (ref >50)
LDL Cholesterol: 94 mg/dL (ref <100)
Total CHOL/HDL Ratio: 4.4 Ratio (ref <5.0)
Triglycerides: 149 mg/dL (ref <150)
VLDL: 30 mg/dL (ref <30)

## 2016-07-31 LAB — HEPATIC FUNCTION PANEL
ALBUMIN: 4.5 g/dL (ref 3.6–5.1)
ALT: 6 U/L (ref 6–29)
AST: 15 U/L (ref 10–35)
Alkaline Phosphatase: 114 U/L (ref 33–130)
BILIRUBIN TOTAL: 0.5 mg/dL (ref 0.2–1.2)
Bilirubin, Direct: 0.1 mg/dL (ref <=0.2)
Indirect Bilirubin: 0.4 mg/dL (ref 0.2–1.2)
Total Protein: 7 g/dL (ref 6.1–8.1)

## 2016-07-31 LAB — MAGNESIUM: MAGNESIUM: 2 mg/dL (ref 1.5–2.5)

## 2016-07-31 LAB — BASIC METABOLIC PANEL WITH GFR
BUN: 11 mg/dL (ref 7–25)
CHLORIDE: 106 mmol/L (ref 98–110)
CO2: 27 mmol/L (ref 20–31)
CREATININE: 0.87 mg/dL (ref 0.50–0.99)
Calcium: 9.9 mg/dL (ref 8.6–10.4)
GFR, Est African American: 82 mL/min (ref >=60)
GFR, Est Non African American: 71 mL/min (ref >=60)
Glucose, Bld: 89 mg/dL (ref 65–99)
Potassium: 4.6 mmol/L (ref 3.5–5.3)
Sodium: 144 mmol/L (ref 135–146)

## 2016-07-31 LAB — HEMOGLOBIN A1C
HEMOGLOBIN A1C: 5.4 % (ref <5.7)
MEAN PLASMA GLUCOSE: 108 mg/dL

## 2016-07-31 LAB — TSH: TSH: 2.98 m[IU]/L

## 2016-08-01 ENCOUNTER — Emergency Department (HOSPITAL_COMMUNITY): Payer: BC Managed Care – PPO

## 2016-08-01 ENCOUNTER — Emergency Department (HOSPITAL_COMMUNITY)
Admission: EM | Admit: 2016-08-01 | Discharge: 2016-08-01 | Disposition: A | Discharge status: Home / Self Care | Payer: BC Managed Care – PPO | Attending: Emergency Medicine | Admitting: Emergency Medicine

## 2016-08-01 ENCOUNTER — Ambulatory Visit: Payer: Self-pay | Admitting: Physician Assistant

## 2016-08-01 ENCOUNTER — Encounter (HOSPITAL_COMMUNITY): Payer: Self-pay

## 2016-08-01 DIAGNOSIS — Z8719 Personal history of other diseases of the digestive system: Secondary | ICD-10-CM | POA: Diagnosis not present

## 2016-08-01 DIAGNOSIS — R1012 Left upper quadrant pain: Secondary | ICD-10-CM

## 2016-08-01 DIAGNOSIS — Z79899 Other long term (current) drug therapy: Secondary | ICD-10-CM | POA: Insufficient documentation

## 2016-08-01 DIAGNOSIS — R1032 Left lower quadrant pain: Secondary | ICD-10-CM | POA: Diagnosis present

## 2016-08-01 DIAGNOSIS — Z7982 Long term (current) use of aspirin: Secondary | ICD-10-CM | POA: Insufficient documentation

## 2016-08-01 DIAGNOSIS — I10 Essential (primary) hypertension: Secondary | ICD-10-CM | POA: Diagnosis not present

## 2016-08-01 LAB — URINALYSIS, ROUTINE W REFLEX MICROSCOPIC
BILIRUBIN URINE: NEGATIVE
Glucose, UA: NEGATIVE mg/dL
HGB URINE DIPSTICK: NEGATIVE
Ketones, ur: NEGATIVE mg/dL
Leukocytes, UA: NEGATIVE
Nitrite: NEGATIVE
PH: 6 (ref 5.0–8.0)
Protein, ur: NEGATIVE mg/dL

## 2016-08-01 LAB — COMPREHENSIVE METABOLIC PANEL
ALBUMIN: 4.3 g/dL (ref 3.5–5.0)
ALK PHOS: 100 U/L (ref 38–126)
ALT: 8 U/L — ABNORMAL LOW (ref 14–54)
ANION GAP: 8 (ref 5–15)
AST: 18 U/L (ref 15–41)
BUN: 12 mg/dL (ref 6–20)
CHLORIDE: 107 mmol/L (ref 101–111)
CO2: 27 mmol/L (ref 22–32)
Calcium: 9.5 mg/dL (ref 8.9–10.3)
Creatinine, Ser: 0.93 mg/dL (ref 0.44–1.00)
GFR calc Af Amer: >60 mL/min (ref >60)
GFR calc non Af Amer: >60 mL/min (ref >60)
Glucose, Bld: 89 mg/dL (ref 65–99)
POTASSIUM: 4.2 mmol/L (ref 3.5–5.1)
SODIUM: 142 mmol/L (ref 135–145)
Total Bilirubin: 0.6 mg/dL (ref 0.3–1.2)
Total Protein: 7 g/dL (ref 6.5–8.1)

## 2016-08-01 LAB — CBC
HEMATOCRIT: 40.7 % (ref 36.0–46.0)
HEMOGLOBIN: 13.1 g/dL (ref 12.0–15.0)
MCH: 30 pg (ref 26.0–34.0)
MCHC: 32.2 g/dL (ref 30.0–36.0)
MCV: 93.3 fL (ref 78.0–100.0)
Platelets: 186 10*3/uL (ref 150–400)
RBC: 4.36 MIL/uL (ref 3.87–5.11)
RDW: 12.7 % (ref 11.5–15.5)
WBC: 4.1 10*3/uL (ref 4.0–10.5)

## 2016-08-01 LAB — LIPASE, BLOOD: LIPASE: 21 U/L (ref 11–51)

## 2016-08-01 MED ORDER — DICYCLOMINE HCL 20 MG PO TABS: 20.0000 mg | ORAL_TABLET | Freq: Two times a day (BID) | ORAL | 0 refills | Status: DC

## 2016-08-01 MED ORDER — SODIUM CHLORIDE 0.9 % IV BOLUS (SEPSIS)
1000.0000 mL | Freq: Once | INTRAVENOUS | Status: AC
  Administered 2016-08-01: 1000 mL via INTRAVENOUS

## 2016-08-01 MED ORDER — DICYCLOMINE HCL 10 MG PO CAPS
10.0000 mg | ORAL_CAPSULE | Freq: Once | ORAL | Status: AC
  Administered 2016-08-01: 10 mg via ORAL
  Filled 2016-08-01: qty 1

## 2016-08-01 MED ORDER — IOPAMIDOL (ISOVUE-300) INJECTION 61%
INTRAVENOUS | Status: AC
  Administered 2016-08-01: 100 mL
  Filled 2016-08-01: qty 100

## 2016-08-01 NOTE — ED Provider Notes (Signed)
[facility] DEPT Provider Note   CSN: [id number] Arrival date & time: 08/01/16  9024     History   Chief Complaint Chief Complaint  Patient presents with  . Back Pain  . Abdominal Pain    HPI Teresa Vasquez is a 66 y.o. female.  Patient with a past medical history of irritable bowel syndrome, diverticulitis, status post multiple abdominal surgeries. Reports that she's had a couple months of abdominal discomfort, getting worse over the last few days. Primarily in the left lower quadrant as well as left flank. Reports having alternating constipation and diarrhea. Denies blood in her stool. Seen by her primary care doctor a couple days ago. Started on ciprofloxacin and Flagyl. Had persistence and worsening of pain, so presented here. Denies any fevers or chills. Endorses nausea, no vomiting. Endorses urinary frequency, urinary hesitancy. Denies dysuria, vaginal bleeding, vaginal discharge.   The history is provided by the patient.  Illness  This is a new problem. Episode onset: 2 months. The problem occurs constantly. The problem has been gradually worsening. Associated symptoms include abdominal pain. Pertinent negatives include no chest pain, no headaches and no shortness of breath. She has tried nothing for the symptoms.    Past Medical History:  Diagnosis Date  . Anxiety and depression   . Depression   . Diverticulitis   . Hyperlipidemia   . Hypertension   . Irritable bowel syndrome     Patient Active Problem List   Diagnosis Date Noted  . Symptomatic cholelithiasis 12/17/2013  . Cholecystitis, acute with cholelithiasis 12/17/2013  . Allergic rhinitis 03/14/2013  . Vitamin D deficiency 03/14/2013  . Elevated blood pressure reading without diagnosis of hypertension 03/14/2013  . Other abnormal glucose 03/14/2013  . Mixed hyperlipidemia 03/14/2013  . DIVERTICULITIS 04/19/2009  . Anxiety state 04/13/2009  . Depression 04/13/2009  . IRRITABLE BOWEL SYNDROME  04/13/2009    Past Surgical History:  Procedure Laterality Date  . CHOLECYSTECTOMY N/A 12/17/2013   Procedure: LAPAROSCOPIC CHOLECYSTECTOMY WITH INTRAOPERATIVE CHOLANGIOGRAM;  Surgeon: Fanny Skates, MD;  Location: [facility];  Service: General;  Laterality: N/A;  . COLON SURGERY  2012   Diverticulosis  . TONSILLECTOMY    . VAGINAL HYSTERECTOMY      OB History    No data available       Home Medications    Prior to Admission medications   Medication Sig Start Date End Date Taking? Authorizing Provider  ALPRAZolam Duanne Moron) 1 MG tablet Take 1/2 to 1 tablet 2 x day if needed for anxiety 07/10/16 01/10/17  Unk Pinto, MD  aspirin 81 MG tablet Take 81 mg by mouth daily.     [provider]  atorvastatin (LIPITOR) 20 MG tablet Take 1 tablet (20 mg total) by mouth daily. 03/12/16 03/12/17  Vicie Mutters, PA-C  ciprofloxacin (CIPRO) 500 MG tablet Take 1 tablet (500 mg total) by mouth 2 (two) times daily. 7 days 07/30/16   Vicie Mutters, PA-C  dicyclomine (BENTYL) 20 MG tablet Take 1 tablet (20 mg total) by mouth 2 (two) times daily. 08/01/16 08/31/16  Maryan Puls, MD  fluticasone [facility]) 50 MCG/ACT nasal spray USE 2 SPRAYS INTO EACH NOSTRIL ONCE DAILY 03/23/16   Unk Pinto, MD  metroNIDAZOLE (FLAGYL) 500 MG tablet Take 1 tablet (500 mg total) by mouth 3 (three) times daily. 07/30/16 08/06/16  Vicie Mutters, PA-C  montelukast (SINGULAIR) 10 MG tablet Take 1 tablet (10 mg total) by mouth daily. 06/21/16   Unk Pinto, MD  omeprazole (PRILOSEC) 40 MG capsule  TAKE ONE CAPSULE EVERY DAY 03/28/16   Unk Pinto, MD    Family History Family History  Problem Relation Age of Onset  . Heart disease Father   . Heart disease Mother   . Hypertension Mother   . Osteoporosis Mother   . Heart attack Brother   . Heart disease Brother   . Irritable bowel syndrome Sister     Social History Social History  Substance Use Topics  . Smoking status: Never Smoker  . Smokeless  tobacco: Never Used  . Alcohol use No     Allergies   Levaquin [levofloxacin in d5w]   Review of Systems Review of Systems  Constitutional: Negative for chills and fever.  Respiratory: Negative for cough and shortness of breath.   Cardiovascular: Negative for chest pain.  Gastrointestinal: Positive for abdominal pain, constipation, diarrhea and nausea. Negative for blood in stool and vomiting.  Genitourinary: Positive for flank pain (left) and frequency. Negative for dysuria, vaginal bleeding and vaginal discharge.  Neurological: Negative for light-headedness and headaches.  All other systems reviewed and are negative.    Physical Exam Updated Vital Signs BP 139/74   Pulse 77   Temp 98.5 F (36.9 C) (Oral)   Resp 13   Ht 5\' 6"  (1.676 m)   Wt 93.4 kg (206 lb)   SpO2 100%   BMI 33.25 kg/m   Physical Exam  Constitutional: She appears well-developed and well-nourished. No distress.  HENT:  Head: Normocephalic and atraumatic.  Eyes: Conjunctivae are normal.  Neck: Neck supple.  Cardiovascular: Normal rate and regular rhythm.   No murmur heard. Pulmonary/Chest: Effort normal and breath sounds normal. No respiratory distress.  Abdominal: Soft. There is tenderness in the left upper quadrant. There is no rebound, no guarding, no CVA tenderness, no tenderness at McBurney's point and negative Murphy's sign.  Musculoskeletal: She exhibits no edema.  Neurological: She is alert.  Skin: Skin is warm and dry.  Psychiatric: She has a normal mood and affect.  Nursing note and vitals reviewed.    ED Treatments / Results  Labs (all labs ordered are listed, but only abnormal results are displayed) Labs Reviewed  COMPREHENSIVE METABOLIC PANEL - Abnormal; Notable for the following:       Result Value   ALT 8 (*)    All other components within normal limits  URINALYSIS, ROUTINE W REFLEX MICROSCOPIC - Abnormal; Notable for the following:    Specific Gravity, Urine <1.005 (*)     All other components within normal limits  LIPASE, BLOOD  CBC    EKG  EKG Interpretation None       Radiology Ct Abdomen Pelvis W Contrast  Result Date: 08/01/2016 CLINICAL DATA:  Left lower quadrant and left flank pain EXAM: CT ABDOMEN AND PELVIS WITH CONTRAST TECHNIQUE: Multidetector CT imaging of the abdomen and pelvis was performed using the standard protocol following bolus administration of intravenous contrast. CONTRAST:  155mL ISOVUE-300 IOPAMIDOL (ISOVUE-300) INJECTION 61% COMPARISON:  11/10/2012 FINDINGS: Lower chest:  No acute finding Hepatobiliary: Stable benign subcentimeter low-density in the upper central liver.Cholecystectomy with unremarkable common bile duct diameter. Pancreas: Unremarkable. Spleen: Nonspecific 10 mm low-density in the subcapsular upper spleen, stable and benign. Adrenals/Urinary Tract: Negative adrenals. No hydronephrosis or stone. Unremarkable bladder. Stomach/Bowel: Sigmoidectomy. Moderate stool in the ascending and transverse colon. No obstruction. No appendicitis. Vascular/Lymphatic: No acute vascular abnormality. No mass or adenopathy. Reproductive:Hysterectomy with symmetric negative ovaries. Other: No ascites or pneumoperitoneum. Musculoskeletal: No acute abnormalities. IMPRESSION: 1. No acute finding to  explain abdominal pain. 2. Cholecystectomy, hysterectomy, and sigmoidectomy. Electronically Signed   By: Monte Fantasia M.D.   On: 08/01/2016 15:16    Procedures Procedures (including critical care time)  Medications Ordered in ED Medications  sodium chloride 0.9 % bolus 1,000 mL (1,000 mLs Intravenous New Bag/Given 08/01/16 1437)  dicyclomine (BENTYL) capsule 10 mg (10 mg Oral Given 08/01/16 1401)  iopamidol (ISOVUE-300) 61 % injection (100 mLs  Contrast Given 08/01/16 1446)     Initial Impression / Assessment and Plan / ED Course  I have reviewed the triage vital signs and the nursing notes.  Pertinent labs & imaging results that were  available during my care of the patient were reviewed by me and considered in my medical decision making (see chart for details).     Patient's description of symptoms seems to be most consistent with irritable bowel syndrome. She did have some tenderness to the left side of her abdomen. Given her history of multiple prior abdominal surgeries and diverticulitis, got a CT of the patient's abdomen. No evidence of acute intra-abdominal process. Gave the patient some IV fluids and Bentyl with resolution of symptoms. The remainder of her labs here are reassuring. No leukocytosis. No anemia. No left right abnormality. Urinalysis not indicative of a urinary tract infection. Lipase without evidence of pancreatitis.  We'll give a prescription for Bentyl. Told to follow up with her primary care doctor as well as her GI physician as soon as possible.  Final Clinical Impressions(s) / ED Diagnoses   Final diagnoses:  Left upper quadrant pain  History of IBS    New Prescriptions New Prescriptions   DICYCLOMINE (BENTYL) 20 MG TABLET    Take 1 tablet (20 mg total) by mouth 2 (two) times daily.     Maryan Puls, MD 08/01/16 1538    Gareth [facility], MD 08/05/16 [phone]

## 2016-08-01 NOTE — ED Triage Notes (Signed)
Pt reports lower back pain radiating into her LLQ. She reports hx of diverticulitis and cholecystectomy. Pt reports nausea without vomiting. Skin warm and dry.

## 2016-08-20 ENCOUNTER — Ambulatory Visit (INDEPENDENT_AMBULATORY_CARE_PROVIDER_SITE_OTHER): Payer: BC Managed Care – PPO | Admitting: Gastroenterology

## 2016-08-20 ENCOUNTER — Encounter: Payer: Self-pay | Admitting: Gastroenterology

## 2016-08-20 VITALS — BP 112/72 | HR 94 | Ht 66.5 in | Wt 204.2 lb

## 2016-08-20 DIAGNOSIS — K582 Mixed irritable bowel syndrome: Secondary | ICD-10-CM | POA: Diagnosis not present

## 2016-08-20 MED ORDER — AMBULATORY NON FORMULARY MEDICATION: 2.0000 | Freq: Two times a day (BID) | 0 refills | Status: DC

## 2016-08-20 MED ORDER — HYDROCORTISONE ACETATE 25 MG RE SUPP
25.0000 mg | Freq: Every day | RECTAL | 1 refills | Status: DC
Start: 2016-08-20 — End: 2016-10-16

## 2016-08-20 MED ORDER — DICYCLOMINE HCL 20 MG PO TABS: 20.0000 mg | ORAL_TABLET | Freq: Two times a day (BID) | ORAL | 2 refills | Status: DC

## 2016-08-20 NOTE — Patient Instructions (Addendum)
We have sent the following medications to your pharmacy for you to pick up at your convenience:  Dicyclomine 20 mg, twice a day.  Hydrocortisone suppository, Take every night at bedtime for 7 days    Please purchase the following medications over the counter and take as directed:  IBgard, take as directed  Miralax, daily (or Citrucel or Benefiber).

## 2016-08-20 NOTE — Progress Notes (Signed)
06/20/3788 MICHALINA CALBERT [id number] 1950-03-19   HISTORY OF PRESENT ILLNESS:  This is a pleasant 66 year old female who is known to Dr. Havery Moros for treatment of her irritable bowel syndrome that alternates from constipation to diarrhea with bloating. She is here today with the same complaints.  She was last seen by him in October 2016 at which time he recommended a FODMAP diet, Citrucel fiber supplement daily, and Bentyl as needed.  She said that she tried Metamucil daily for a while but seemed to constipate her more. She says that she has tried probiotics and takes Gas-X for gas and bloating. Uses milk of magnesia on occasion to help her move her bowels. Most recently on May 31 she was having a lot of abdominal pain, particularly in the left lower quadrant. She went to the emergency department and had a CT scan of the abdomen and pelvis with contrast that did not show any acute findings.  CBC, CMP, lipase, TSH unremarkable.  Was given dicyclomine 20 mg to take 2 pills BID.  Says that she is only taking 1 or 2 per day and it does seem to help some.  She had this medication previously and is unsure why she stopped taking it; says that she probably was feeling well for a while suggest discontinued it. She has tried probiotics.  She admits that all of her symptoms are similar to what she had previously. She complains of lower abdominal pain, sometimes more in the left lower quadrant. Bowel habits alternate anywhere from constipation with no bowel movement for 3 days to urgent postprandial diarrhea depending on what she eats. She knows that her diet has a lot of affect on her symptoms. Has a lot of bloating and gas for which she takes Gas-X.  She denies seeing any blood in the stool. She does report some anal discomfort/sensation of fullness and is wondering if it is hemorrhoids.  She has a history of diverticulosis / diverticulitis. She reports she had a surgery to remove a segment of her colon  (sigmoidectomy I believe) for this in 2012. She had a colonoscopy in 2011 which showed one hyperplastic polyp and diverticulosis, told to follow up in 10 years for repeat CRC Screening. She has had her GB removed in 2015.     Past Medical History:  Diagnosis Date  . Anxiety and depression   . Depression   . Diverticulitis   . Hyperlipidemia   . Hypertension   . Irritable bowel syndrome    Past Surgical History:  Procedure Laterality Date  . CHOLECYSTECTOMY N/A 12/17/2013   Procedure: LAPAROSCOPIC CHOLECYSTECTOMY WITH INTRAOPERATIVE CHOLANGIOGRAM;  Surgeon: Fanny Skates, MD;  Location: [facility];  Service: General;  Laterality: N/A;  . COLON SURGERY  2012   Diverticulosis  . TONSILLECTOMY    . VAGINAL HYSTERECTOMY      reports that she has never smoked. She has never used smokeless tobacco. She reports that she does not drink alcohol or use drugs. family history includes Heart attack in her brother; Heart disease in her brother, father, and mother; Hypertension in her mother; Irritable bowel syndrome in her sister; Osteoporosis in her mother. Allergies  Allergen Reactions  . Levaquin [Levofloxacin In D5w] Nausea Only      Outpatient Encounter Prescriptions as of 08/20/2016  Medication Sig  . ALPRAZolam (XANAX) 1 MG tablet Take 1/2 to 1 tablet 2 x day if needed for anxiety  . aspirin 81 MG tablet Take 81 mg by mouth daily.   Marland Kitchen  atorvastatin (LIPITOR) 20 MG tablet Take 1 tablet (20 mg total) by mouth daily.  . ciprofloxacin (CIPRO) 500 MG tablet Take 1 tablet (500 mg total) by mouth 2 (two) times daily. 7 days  . dicyclomine (BENTYL) 20 MG tablet Take 1 tablet (20 mg total) by mouth 2 (two) times daily.  . fluticasone (FLONASE) 50 MCG/ACT nasal spray USE 2 SPRAYS INTO EACH NOSTRIL ONCE DAILY  . montelukast (SINGULAIR) 10 MG tablet Take 1 tablet (10 mg total) by mouth daily.  Marland Kitchen omeprazole (PRILOSEC) 40 MG capsule TAKE ONE CAPSULE EVERY DAY   No facility-administered encounter  medications on file as of 08/20/2016.      REVIEW OF SYSTEMS  : All other systems reviewed and negative except where noted in the History of Present Illness.   PHYSICAL EXAM: BP 112/72   Pulse 94   Ht 5' 6.5" (1.689 m)   Wt 204 lb 3.2 oz (92.6 kg)   SpO2 98%   BMI 32.46 kg/m  General: Well developed white female in no acute distress Head: Normocephalic and atraumatic Eyes:  Sclerae anicteric, conjunctiva pink. Ears: Normal auditory acuity Lungs: Clear throughout to auscultation; no increased WOB. Heart: Regular rate and rhythm Abdomen: Soft, non-distended. Normal bowel sounds.  Mild left sided TTP. Rectal:  Small hemorrhoid noted, but no other abnormalities.  DRE did not reveal any masses.  Light brown stool on exam glove was heme negative. Musculoskeletal: Symmetrical with no gross deformities  Skin: No lesions on visible extremities Extremities: No edema  Neurological: Alert oriented x 4, grossly non-focal Psychological:  Alert and cooperative. Normal mood and affect  ASSESSMENT AND PLAN: -66 year old female with IBS that alternates from constipation to loose stools.  I have again recommended daily powder fiber supplement such as Benefiber or Citrucel as well as daily Miralax as they can work well together.  Will try IBgard for bloating/gas and can continue dicyclomine prn.  Could consider a trial of Xifaxan. -Anal discomfort/fullness:  Only hemorrhoids noted on DRE today.  Will try hydrocortisone suppositories for 7 days.   CC:  Unk Pinto, MD

## 2016-08-21 NOTE — Progress Notes (Signed)
Agree with assessment and plan as outlined. If symptoms persist despite regimen recommended would consider trial of Rifaximin.

## 2016-09-16 ENCOUNTER — Other Ambulatory Visit: Payer: Self-pay | Admitting: Internal Medicine

## 2016-09-16 DIAGNOSIS — R350 Frequency of micturition: Secondary | ICD-10-CM

## 2016-10-06 ENCOUNTER — Other Ambulatory Visit: Payer: Self-pay | Admitting: Internal Medicine

## 2016-10-08 ENCOUNTER — Encounter: Payer: Self-pay | Admitting: Physician Assistant

## 2016-10-08 ENCOUNTER — Encounter: Payer: Self-pay | Admitting: Internal Medicine

## 2016-10-16 ENCOUNTER — Ambulatory Visit (INDEPENDENT_AMBULATORY_CARE_PROVIDER_SITE_OTHER): Payer: BC Managed Care – PPO | Admitting: Internal Medicine

## 2016-10-16 ENCOUNTER — Encounter: Payer: Self-pay | Admitting: Internal Medicine

## 2016-10-16 VITALS — BP 132/84 | HR 80 | Temp 97.3°F | Resp 16 | Ht 66.5 in | Wt 207.2 lb

## 2016-10-16 DIAGNOSIS — J0410 Acute tracheitis without obstruction: Secondary | ICD-10-CM

## 2016-10-16 DIAGNOSIS — J0140 Acute pansinusitis, unspecified: Secondary | ICD-10-CM

## 2016-10-16 MED ORDER — PREDNISONE 20 MG PO TABS: ORAL_TABLET | ORAL | 0 refills | Status: DC

## 2016-10-16 MED ORDER — AZITHROMYCIN 250 MG PO TABS: ORAL_TABLET | ORAL | 1 refills | Status: DC

## 2016-10-16 MED ORDER — PROMETHAZINE-DM 6.25-15 MG/5ML PO SYRP: ORAL_SOLUTION | ORAL | 1 refills | Status: DC

## 2016-10-16 NOTE — Patient Instructions (Signed)

## 2016-10-16 NOTE — Progress Notes (Signed)
  Subjective:    Patient ID: Teresa Vasquez, female    DOB: 12/28/1950, 66 y.o.   MRN: [id number]  HPI  This very nice 66 yo MWF presents with a 1 week prodrome of worsening sinus congestion, pressure and HA and has also started to cough productively of a greenish-yellow sputum. Denies fever, chills, rash or dyspnea.  Medication Sig  . ALPRAZolam  1 MG tablet Take 1/2 to 1 tablet 2 x day if needed for anxiety  . aspirin 81 MG tablet Take 81 mg by mouth daily.   Marland Kitchen atorvastatin 20 MG tablet Take 1 tablet (20 mg total) by mouth daily.  Marland Kitchen FLONASE nasal spray USE 2 SPRAYS INTO EACH NOSTRIL ONCE DAILY  . Montelukast 10 MG tablet Take 1 tablet (10 mg total) by mouth daily.  Marland Kitchen omeprazole  40 MG capsule TAKE 1 CAPSULE BY MOUTH ONCE DAILY  . dicyclomine 20 MG tablet Take 1 tablet (20 mg total) by mouth 2 (two) times daily.  . IBgard Take 2 capsules by mouth 2 (two) times daily.  . ANUSOL-HC 25 MG supp Place 1 suppository (25 mg total) rectally at bedtime.   Allergies  Allergen Reactions  . Levaquin [Levofloxacin In D5w] Nausea Only   Past Medical History:  Diagnosis Date  . Anxiety and depression   . Depression   . Diverticulitis   . Hyperlipidemia   . Hypertension   . Irritable bowel syndrome     Past Surgical History:  Procedure Laterality Date  . CHOLECYSTECTOMY N/A 12/17/2013   Procedure: LAPAROSCOPIC CHOLECYSTECTOMY WITH INTRAOPERATIVE CHOLANGIOGRAM;  Surgeon: Fanny Skates, MD;  Location: [facility];  Service: General;  Laterality: N/A;  . COLON SURGERY  2012   Diverticulosis  . TONSILLECTOMY    . VAGINAL HYSTERECTOMY     Review of Systems  10 point systems review negative except as above.    Objective:   Physical Exam  BP 132/84   Pulse 80   Temp (!) 97.3 F (36.3 C)   Resp 16   Ht 5' 6.5" (1.689 m)   Wt 207 lb 3.2 oz (94 kg)   BMI 32.94 kg/m   No stridor. Dry cough.   HEENT - EAC's/TM's - WNL. (+) tender fronto-maxillary areas. N/O/P clear. Neck - supple.  Chest -   BS equal with few scattered rales and no rhonchi or wheezes. Cor - Nl HS. RRR w/o sig MGR. PP 1(+). No edema. MS- FROM w/o deformities.  Gait Nl. Neuro -  Nl w/o focal abnormalities. Skin - Clear w/o exposed rash or cyanosis.     Assessment & Plan:   1. Tracheitis  2. Subacute pansinusitis  - predniSONE (DELTASONE) 20 MG tablet; 1 tab 3 x day for 3 days, then 1 tab 2 x day for 3 days, then 1 tab 1 x day for 5 days  Dispense: 20 tablet; Refill: 0  - azithromycin (ZITHROMAX) 250 MG tablet; Take 2 tablets (500 mg) on  Day 1,  followed by 1 tablet (250 mg) once daily on Days 2 through 5.  Dispense: 6 each; Refill: 1  - promethazine-dextromethorphan (PROMETHAZINE-DM) 6.25-15 MG/5ML syrup; Take 1 to 2 tsp every 4 hours if needed for cough  Dispense: 360 mL; Refill: 1  - Dicussed meds/SE's & sx's to return for or go to ER.

## 2016-11-12 ENCOUNTER — Ambulatory Visit: Payer: Self-pay | Admitting: Physician Assistant

## 2016-11-14 NOTE — Progress Notes (Signed)
Assessment and Plan:   Hypertension -Continue medication, monitor blood pressure at home. Continue DASH diet.  Reminder to go to the ER if any CP, SOB, nausea, dizziness, severe HA, changes vision/speech, left arm numbness and tingling and jaw pain.   Cholesterol -Continue diet and exercise. Check cholesterol.   Obesity with co morbidities - long discussion about weight loss, diet, and exercise  Vitamin D Def - check level and continue medications.   Major depressive disorder with single episode, remission status unspecified -     sertraline (ZOLOFT) 50 MG tablet; Take 1 tablet (50 mg total) by mouth daily.  Acute pain of both shoulders -     meloxicam (MOBIC) 15 MG tablet; Take one daily with food for 2 weeks, can take with tylenol, can not take with aleve, iburpofen, then as needed daily for pain - ? Shoulder impingement, if not better will refer to ortho  Medication management -     Magnesium   Continue diet and meds as discussed. Further disposition pending results of labs. Over 30 minutes of exam, counseling, chart review, and critical decision making was performed Future Appointments Date Time Provider [place]  02/13/2017 11:00 AM Vicie Mutters, PA-C [id number] None    HPI 66 y.o. female  presents for 3 month follow up on hypertension, cholesterol, prediabetes, and vitamin D deficiency.   Her blood pressure has been controlled at home, today their BP is BP: 128/80  She does not workout. She denies chest pain, shortness of breath, dizziness.  She has a lot of stress with her husband, he is drinking and smoking, having anger issues, states not towards her, she has had anxiety, SOB with being upset only, nonexertional. She has been having bilateral hand numbness when she wakes up, no weakness, no neck pain, + bilateral shoulder pain. She will take the xanax PRN,   She is on cholesterol medication, started atorvastatin last visit and denies myalgias. Her cholesterol  is not at goal. The cholesterol last visit was:   Lab Results  Component Value Date   CHOL 161 07/30/2016   HDL 37 (L) 07/30/2016   LDLCALC 94 07/30/2016   TRIG 149 07/30/2016   CHOLHDL 4.4 07/30/2016   Last A1C in the office was:  Lab Results  Component Value Date   HGBA1C 5.4 07/30/2016   Patient is on Vitamin D supplement.   Lab Results  Component Value Date   VD25OH 37 01/08/2016     BMI is Body mass index is 32.81 kg/m., she is working on diet and exercise. Wt Readings from Last 3 Encounters:  11/18/16 206 lb 6.4 oz (93.6 kg)  10/16/16 207 lb 3.2 oz (94 kg)  08/20/16 204 lb 3.2 oz (92.6 kg)    Current Medications:  Current Outpatient Prescriptions on File Prior to Visit  Medication Sig Dispense Refill  . ALPRAZolam (XANAX) 1 MG tablet Take 1/2 to 1 tablet 2 x day if needed for anxiety 180 tablet 1  . aspirin 81 MG tablet Take 81 mg by mouth daily.     Marland Kitchen atorvastatin (LIPITOR) 20 MG tablet Take 1 tablet (20 mg total) by mouth daily. 90 tablet 1  . fluticasone (FLONASE) 50 MCG/ACT nasal spray USE 2 SPRAYS INTO EACH NOSTRIL ONCE DAILY 48 g 1  . montelukast (SINGULAIR) 10 MG tablet Take 1 tablet (10 mg total) by mouth daily. 90 tablet 1  . omeprazole (PRILOSEC) 40 MG capsule TAKE 1 CAPSULE BY MOUTH ONCE DAILY 90 capsule 1  .  promethazine-dextromethorphan (PROMETHAZINE-DM) 6.25-15 MG/5ML syrup Take 1 to 2 tsp every 4 hours if needed for cough 360 mL 1  . dicyclomine (BENTYL) 20 MG tablet Take 1 tablet (20 mg total) by mouth 2 (two) times daily. 120 tablet 2   No current facility-administered medications on file prior to visit.    Medical History:  Past Medical History:  Diagnosis Date  . Anxiety and depression   . Depression   . Diverticulitis   . Hyperlipidemia   . Hypertension   . Irritable bowel syndrome    Allergies:  Allergies  Allergen Reactions  . Levofloxacin      Review of Systems:  Review of Systems  Constitutional: Negative.   HENT: Negative.    Eyes: Negative.   Respiratory: Negative.   Cardiovascular: Negative.   Gastrointestinal: Positive for abdominal pain, diarrhea and heartburn. Negative for blood in stool, constipation, melena, nausea and vomiting.  Genitourinary: Negative.   Musculoskeletal: Negative.   Skin: Negative.   Neurological: Negative.   Psychiatric/Behavioral: Negative for depression, hallucinations, memory loss, substance abuse and suicidal ideas. The patient is nervous/anxious. The patient does not have insomnia.     Family history- Review and unchanged Social history- Review and unchanged Physical Exam: BP 128/80   Resp 14   Ht 5' 6.5" (1.689 m)   Wt 206 lb 6.4 oz (93.6 kg)   BMI 32.81 kg/m  Wt Readings from Last 3 Encounters:  11/18/16 206 lb 6.4 oz (93.6 kg)  10/16/16 207 lb 3.2 oz (94 kg)  08/20/16 204 lb 3.2 oz (92.6 kg)   General Appearance: Well nourished, in no apparent distress. Eyes: PERRLA, EOMs, conjunctiva no swelling or erythema Sinuses: No Frontal/maxillary tenderness ENT/Mouth: Ext aud canals clear, TMs without erythema, bulging. No erythema, swelling, or exudate on post pharynx.  Tonsils not swollen or erythematous. Hearing normal.  Neck: Supple, thyroid normal.  Respiratory: Respiratory effort normal, BS equal bilaterally without rales, rhonchi, wheezing or stridor.  Cardio: RRR with no MRGs. Brisk peripheral pulses without edema.  Abdomen: Soft, + BS, LLQ tenderness, no guarding, rebound, hernias, masses. Lymphatics: Non tender without lymphadenopathy.  Musculoskeletal: Full ROM, 5/5 strength, Normal gait, + shoulder impingement sign, good strength bilateral hands.  Skin: Warm, dry without rashes, lesions, ecchymosis.  Neuro: Cranial nerves intact. Normal muscle tone, no cerebellar symptoms. Psych: Awake and oriented X 3, normal affect, Insight and Judgment appropriate.    Vicie Mutters, PA-C 8:57 AM [facility]

## 2016-11-18 ENCOUNTER — Encounter: Payer: Self-pay | Admitting: Physician Assistant

## 2016-11-18 ENCOUNTER — Ambulatory Visit (INDEPENDENT_AMBULATORY_CARE_PROVIDER_SITE_OTHER): Payer: BC Managed Care – PPO | Admitting: Physician Assistant

## 2016-11-18 VITALS — BP 128/80 | HR 100 | Temp 97.5°F | Resp 14 | Ht 66.5 in | Wt 206.4 lb

## 2016-11-18 DIAGNOSIS — M25511 Pain in right shoulder: Secondary | ICD-10-CM

## 2016-11-18 DIAGNOSIS — Z79899 Other long term (current) drug therapy: Secondary | ICD-10-CM

## 2016-11-18 DIAGNOSIS — F329 Major depressive disorder, single episode, unspecified: Secondary | ICD-10-CM

## 2016-11-18 DIAGNOSIS — R030 Elevated blood-pressure reading, without diagnosis of hypertension: Secondary | ICD-10-CM

## 2016-11-18 DIAGNOSIS — E559 Vitamin D deficiency, unspecified: Secondary | ICD-10-CM

## 2016-11-18 DIAGNOSIS — R7309 Other abnormal glucose: Secondary | ICD-10-CM | POA: Diagnosis not present

## 2016-11-18 DIAGNOSIS — M25512 Pain in left shoulder: Secondary | ICD-10-CM

## 2016-11-18 DIAGNOSIS — E782 Mixed hyperlipidemia: Secondary | ICD-10-CM

## 2016-11-18 MED ORDER — MELOXICAM 15 MG PO TABS: ORAL_TABLET | ORAL | 1 refills | Status: DC

## 2016-11-18 MED ORDER — SERTRALINE HCL 50 MG PO TABS: 50.0000 mg | ORAL_TABLET | Freq: Every day | ORAL | 2 refills | Status: DC

## 2016-11-18 NOTE — Patient Instructions (Signed)
Start the zoloft, do 1/2 pill x 1 week and then go up to 1 pill.   Can refer to ortho for your shoulder/hands Get cervical neck pillow  Mobic is an antiinflammatory It helps pain, can not take with aleve, or ibuprofen You can take tylenol (500mg ) or tylenol arthritis (650mg ) with the meloxicam/antiinflammatories. The max you can take of tylenol a day is 3000mg  daily, this is a max of 6 pills a day of the regular tyelnol (500mg ) or a max of 4 a day of the tylenol arthritis (650mg ) as long as no other medications you are taking contain tylenol.   Mobic can cause inflammation in your stomach and can cause ulcers or bleeding, this will look like black tarry stools Make sure you take your mobic with food Try not to take it daily, take AS needed Can take with zantac   Shoulder Impingement Syndrome Rehab Ask your health care provider which exercises are safe for you. Do exercises exactly as told by your health care provider and adjust them as directed. It is normal to feel mild stretching, pulling, tightness, or discomfort as you do these exercises, but you should stop right away if you feel sudden pain or your pain gets worse.Do not begin these exercises until told by your health care provider. Stretching and range of motion exercise This exercise warms up your muscles and joints and improves the movement and flexibility of your shoulder. This exercise also helps to relieve pain and stiffness. Exercise A: Passive horizontal adduction  1. Sit or stand and pull your left / right elbow across your chest, toward your other shoulder. Stop when you feel a gentle stretch in the back of your shoulder and upper arm. ? Keep your arm at shoulder height. ? Keep your arm as close to your body as you comfortably can. 2. Hold for __________ seconds. 3. Slowly return to the starting position. Repeat __________ times. Complete this exercise __________ times a day. Strengthening exercises These exercises build  strength and endurance in your shoulder. Endurance is the ability to use your muscles for a long time, even after they get tired. Exercise B: External rotation, isometric 1. Stand or sit in a doorway, facing the door frame. 2. Bend your left / right elbow and place the back of your wrist against the door frame. Only your wrist should be touching the frame. Keep your upper arm at your side. 3. Gently press your wrist against the door frame, as if you are trying to push your arm away from your abdomen. ? Avoid shrugging your shoulder while you press your hand against the door frame. Keep your shoulder blade tucked down toward the middle of your back. 4. Hold for __________ seconds. 5. Slowly release the tension, and relax your muscles completely before you do the exercise again. Repeat __________ times. Complete this exercise __________ times a day. Exercise C: Internal rotation, isometric  1. Stand or sit in a doorway, facing the door frame. 2. Bend your left / right elbow and place the inside of your wrist against the door frame. Only your wrist should be touching the frame. Keep your upper arm at your side. 3. Gently press your wrist against the door frame, as if you are trying to push your arm toward your abdomen. ? Avoid shrugging your shoulder while you press your hand against the door frame. Keep your shoulder blade tucked down toward the middle of your back. 4. Hold for __________ seconds. 5. Slowly release the  tension, and relax your muscles completely before you do the exercise again. Repeat __________ times. Complete this exercise __________ times a day. Exercise D: Scapular protraction, supine  1. Lie on your back on a firm surface. Hold a __________ weight in your left / right hand. 2. Raise your left / right arm straight into the air so your hand is directly above your shoulder joint. 3. Push the weight into the air so your shoulder lifts off of the surface that you are lying on. Do  not move your head, neck, or back. 4. Hold for __________ seconds. 5. Slowly return to the starting position. Let your muscles relax completely before you repeat this exercise. Repeat __________ times. Complete this exercise __________ times a day. Exercise E: Scapular retraction  1. Sit in a stable chair without armrests, or stand. 2. Secure an exercise band to a stable object in front of you so the band is at shoulder height. 3. Hold one end of the exercise band in each hand. Your palms should face down. 4. Squeeze your shoulder blades together and move your elbows slightly behind you. Do not shrug your shoulders while you do this. 5. Hold for __________ seconds. 6. Slowly return to the starting position. Repeat __________ times. Complete this exercise __________ times a day. Exercise F: Shoulder extension  1. Sit in a stable chair without armrests, or stand. 2. Secure an exercise band to a stable object in front of you where the band is above shoulder height. 3. Hold one end of the exercise band in each hand. 4. Straighten your elbows and lift your hands up to shoulder height. 5. Squeeze your shoulder blades together and pull your hands down to the sides of your thighs. Stop when your hands are straight down by your sides. Do not let your hands go behind your body. 6. Hold for __________ seconds. 7. Slowly return to the starting position. Repeat __________ times. Complete this exercise __________ times a day. This information is not intended to replace advice given to you by your health care provider. Make sure you discuss any questions you have with your health care provider. Document Released: 02/18/2005 Document Revised: 10/26/2015 Document Reviewed: 01/21/2015 Elsevier Interactive Patient Education  [employer].

## 2016-11-19 LAB — LIPID PANEL
CHOL/HDL RATIO: 5.1 (calc) — AB (ref <5.0)
Cholesterol: 180 mg/dL (ref <200)
HDL: 35 mg/dL — AB (ref >50)
LDL CHOLESTEROL (CALC): 115 mg/dL — AB
Non-HDL Cholesterol (Calc): 145 mg/dL (calc) — ABNORMAL HIGH (ref <130)
Triglycerides: 189 mg/dL — ABNORMAL HIGH (ref <150)

## 2016-11-19 LAB — BASIC METABOLIC PANEL WITH GFR
BUN: 14 mg/dL (ref 7–25)
CALCIUM: 9.6 mg/dL (ref 8.6–10.4)
CO2: 25 mmol/L (ref 20–32)
Chloride: 107 mmol/L (ref 98–110)
Creat: 0.85 mg/dL (ref 0.50–0.99)
GFR, Est African American: 85 mL/min/{1.73_m2} (ref >=60)
GFR, Est Non African American: 73 mL/min/{1.73_m2} (ref >=60)
GLUCOSE: 94 mg/dL (ref 65–99)
POTASSIUM: 4.3 mmol/L (ref 3.5–5.3)
Sodium: 141 mmol/L (ref 135–146)

## 2016-11-19 LAB — CBC WITH DIFFERENTIAL/PLATELET
BASOS PCT: 0.6 %
Basophils Absolute: 29 cells/uL (ref 0–200)
EOS PCT: 4 %
Eosinophils Absolute: 192 cells/uL (ref 15–500)
HEMATOCRIT: 39 % (ref 35.0–45.0)
HEMOGLOBIN: 13.3 g/dL (ref 11.7–15.5)
Lymphs Abs: 1373 cells/uL (ref 850–3900)
MCH: 30.4 pg (ref 27.0–33.0)
MCHC: 34.1 g/dL (ref 32.0–36.0)
MCV: 89 fL (ref 80.0–100.0)
MONOS PCT: 7.2 %
MPV: 10.8 fL (ref 7.5–12.5)
NEUTROS ABS: 2861 {cells}/uL (ref 1500–7800)
Neutrophils Relative %: 59.6 %
Platelets: 205 10*3/uL (ref 140–400)
RBC: 4.38 10*6/uL (ref 3.80–5.10)
RDW: 12.9 % (ref 11.0–15.0)
Total Lymphocyte: 28.6 %
WBC mixed population: 346 cells/uL (ref 200–950)
WBC: 4.8 10*3/uL (ref 3.8–10.8)

## 2016-11-19 LAB — HEPATIC FUNCTION PANEL
AG Ratio: 1.7 (calc) (ref 1.0–2.5)
ALBUMIN MSPROF: 4.3 g/dL (ref 3.6–5.1)
ALT: 8 U/L (ref 6–29)
AST: 17 U/L (ref 10–35)
Alkaline phosphatase (APISO): 133 U/L — ABNORMAL HIGH (ref 33–130)
BILIRUBIN INDIRECT: 0.4 mg/dL (ref 0.2–1.2)
Bilirubin, Direct: 0.1 mg/dL (ref 0.0–0.2)
GLOBULIN: 2.5 g/dL (ref 1.9–3.7)
TOTAL PROTEIN: 6.8 g/dL (ref 6.1–8.1)
Total Bilirubin: 0.5 mg/dL (ref 0.2–1.2)

## 2016-11-19 LAB — TSH: TSH: 4.91 m[IU]/L — AB (ref 0.40–4.50)

## 2016-11-19 LAB — MAGNESIUM: Magnesium: 2 mg/dL (ref 1.5–2.5)

## 2017-01-06 ENCOUNTER — Ambulatory Visit
Admission: RE | Admit: 2017-01-06 | Discharge: 2017-01-06 | Disposition: A | Discharge status: Home / Self Care | Payer: BC Managed Care – PPO | Source: Ambulatory Visit | Attending: Orthopedic Surgery | Admitting: Orthopedic Surgery

## 2017-01-06 ENCOUNTER — Other Ambulatory Visit: Payer: Self-pay | Admitting: Orthopedic Surgery

## 2017-01-06 DIAGNOSIS — S83241A Other tear of medial meniscus, current injury, right knee, initial encounter: Secondary | ICD-10-CM

## 2017-01-07 ENCOUNTER — Encounter: Payer: Self-pay | Admitting: Internal Medicine

## 2017-01-07 ENCOUNTER — Encounter: Payer: Self-pay | Admitting: Physician Assistant

## 2017-01-19 ENCOUNTER — Other Ambulatory Visit: Payer: Self-pay | Admitting: Internal Medicine

## 2017-01-19 NOTE — Telephone Encounter (Signed)
Please call alpraz 

## 2017-01-26 ENCOUNTER — Other Ambulatory Visit: Payer: Self-pay | Admitting: Physician Assistant

## 2017-01-26 DIAGNOSIS — J0100 Acute maxillary sinusitis, unspecified: Secondary | ICD-10-CM

## 2017-01-31 ENCOUNTER — Other Ambulatory Visit: Payer: Self-pay | Admitting: Internal Medicine

## 2017-02-12 NOTE — Progress Notes (Signed)
Complete Physical  Assessment and Plan:  Allergic rhinitis due to pollen, unspecified seasonality Continue allergy medication  Irritable bowel syndrome, unspecified type If not on benefiber then add it, decrease stress,  if any worsening symptoms, blood in stool, AB pain, etc call office  Major depressive disorder with single episode, remission status unspecified -     buPROPion (WELLBUTRIN XL) 150 MG 24 hr tablet; Take 1 tablet (150 mg total) by mouth every morning.  DIVERTICULITIS Add fiber  Elevated blood pressure reading without diagnosis of hypertension - continue medications, DASH diet, exercise and monitor at home. Call if greater than 130/80.  -     CBC with Differential/Platelet -     BASIC METABOLIC PANEL WITH GFR -     Hepatic function panel -     TSH -     Urinalysis, Routine w reflex microscopic -     Microalbumin / creatinine urine ratio -     EKG 12-Lead  Other abnormal glucose Discussed disease progression and risks Discussed diet/exercise, weight management and risk modification  Mixed hyperlipidemia -continue medications, check lipids, decrease fatty foods, increase activity.  -     Lipid panel  Vitamin D deficiency -     VITAMIN D 25 Hydroxy (Vit-D Deficiency, Fractures)  Anxiety state Monitor  Encounter for general adult medical examination with abnormal findings 1 year  BMI 33.0-33.9,adult - will discuss weight loss meds next OV - long discussion about weight loss, diet, and exercise -recommended diet heavy in fruits and veggies and low in animal meats, cheeses, and dairy products  Acute vaginitis -     fluconazole (DIFLUCAN) 150 MG tablet; Take 1 tablet (150 mg total) by mouth daily. - if not better will message  Medication management -     Magnesium   Discussed med's effects and SE's. Screening labs and tests as requested with regular follow-up as recommended. Future Appointments  Date Time Provider [place]  02/26/2018 10:00  AM Vicie Mutters, PA-C [facility] None     HPI  66 y.o. female  presents for a complete physical.  She is retired but still helps with polling in [employer]   Had a yeast infection from ABX use a month ago, tried monistat without relief.   Her blood pressure has been controlled at home, today their BP is BP: 124/76.  She does not workout. She denies chest pain, shortness of breath, dizziness.   She is not on cholesterol medication and denies myalgias. Her cholesterol is not at goal. The cholesterol last visit was:  Lab Results  Component Value Date   CHOL 180 11/18/2016   HDL 35 (L) 11/18/2016   LDLCALC 94 07/30/2016   TRIG 189 (H) 11/18/2016   CHOLHDL 5.1 (H) 11/18/2016  . She hasn't been working on diet and exercise for prediabetes, she is on bASA, she is not on ACE/ARB and denies foot ulcerations, hyperglycemia, hypoglycemia , increased appetite, nausea, paresthesia of the feet, polydipsia, polyuria, visual disturbances, vomiting and weight loss. Last A1C in the office was:  Lab Results  Component Value Date   HGBA1C 5.4 07/30/2016   Patient is on Vitamin D supplement.   Lab Results  Component Value Date   VD25OH 37 01/08/2016     Patient reports that she has been taking xanax mostly to help her sleep at nighttime. She has had 2 surgeries for her knee, has had some depression with this, was given zoloft but has not start it due to fear of weight  loss. She has a lot of stress with her husband, he is drinking and smoking, having anger issues, states not towards her.  She is following with Dr. Harrington Challenger.  She reports that she is going to see Dr. Deatra Ina.   BMI is Body mass index is 33.3 kg/m., she is working on diet and exercise. Wt Readings from Last 3 Encounters:  02/13/17 212 lb 9.6 oz (96.4 kg)  11/18/16 206 lb 6.4 oz (93.6 kg)  10/16/16 207 lb 3.2 oz (94 kg)    Current Medications:  Current Outpatient Medications on File Prior to Visit  Medication Sig Dispense  Refill  . ALPRAZolam (XANAX) 1 MG tablet Take 1/2 to 1 tablet 1 or 2 x / day ONLY if needed for Anxiety 180 tablet 0  . aspirin 81 MG tablet Take 81 mg by mouth daily.     Marland Kitchen atorvastatin (LIPITOR) 20 MG tablet Take 1 tablet (20 mg total) by mouth daily. 90 tablet 1  . fluticasone (FLONASE) 50 MCG/ACT nasal spray USE 2 SPRAYS INTO EACH NOSTRIL ONCE DAILY 48 g 1  . montelukast (SINGULAIR) 10 MG tablet Take 1 tablet (10 mg total) by mouth daily. 90 tablet 1  . omeprazole (PRILOSEC) 40 MG capsule TAKE 1 CAPSULE BY MOUTH ONCE DAILY 90 capsule 1  . benzonatate (TESSALON) 100 MG capsule TAKE TWO CAPSULES BY MOUTH THREE TIMES DAILY AS NEEDED FOR COUGH (MAX  600  MG  PER  DAY) (Patient not taking: Reported on 02/13/2017) 60 capsule 0  . dicyclomine (BENTYL) 20 MG tablet Take 1 tablet (20 mg total) by mouth 2 (two) times daily. 120 tablet 2   No current facility-administered medications on file prior to visit.     Health Maintenance:   Immunization History  Administered Date(s) Administered  . Tdap 08/27/2012   Tetanus: 2014 Flu vaccine: Due today  IOM:BTDHRCBULAGT XMI:[id number] 2012 MGM: 2013  Colonoscopy: 2011 CT AB 08/01/2016 Stress test 2014  Patient Care Team: Unk Pinto, MD as PCP - General (Internal Medicine) Rozetta Nunnery, MD as Consulting Physician (Otolaryngology) Nahser, Wonda Cheng, MD as Consulting Physician (Cardiology) Inda Castle, MD (Inactive) as Consulting Physician (Gastroenterology) Gus Height, MD as Consulting Physician (Obstetrics and Gynecology)  Medical History:  Past Medical History:  Diagnosis Date  . Anxiety and depression   . Depression   . Diverticulitis   . Hyperlipidemia   . Hypertension   . Irritable bowel syndrome    Allergies Allergies  Allergen Reactions  . Levofloxacin     SURGICAL HISTORY She  has a past surgical history that includes Tonsillectomy; Vaginal hysterectomy; Colon surgery (2012); and Cholecystectomy (N/A,  12/17/2013). FAMILY HISTORY Her family history includes Heart attack in her brother; Heart disease in her brother, father, and mother; Hypertension in her mother; Irritable bowel syndrome in her sister; Osteoporosis in her mother. SOCIAL HISTORY She  reports that  has never smoked. she has never used smokeless tobacco. She reports that she does not drink alcohol or use drugs.  Review of Systems: Review of Systems  Constitutional: Negative for chills, fever and malaise/fatigue.  HENT: Negative for congestion, ear pain and sore throat.   Eyes: Negative.   Respiratory: Negative for cough, shortness of breath and wheezing.   Cardiovascular: Negative for chest pain, palpitations and leg swelling.  Gastrointestinal: Negative for abdominal pain, blood in stool, constipation, diarrhea, heartburn and melena.  Genitourinary: Negative.   Skin: Negative.   Neurological: Negative for dizziness, sensory change, loss of consciousness and headaches.  Psychiatric/Behavioral: Negative for depression. The patient is not nervous/anxious and does not have insomnia.     Physical Exam: Estimated body mass index is 33.3 kg/m as calculated from the following:   Height as of this encounter: 5\' 7"  (1.702 m).   Weight as of this encounter: 212 lb 9.6 oz (96.4 kg). BP 124/76   Pulse (!) 103   Temp (!) 97.4 F (36.3 C)   Resp 16   Ht 5\' 7"  (1.702 m)   Wt 212 lb 9.6 oz (96.4 kg)   SpO2 98%   BMI 33.30 kg/m   General Appearance: Well nourished well developed, in no apparent distress.  Eyes: PERRLA, EOMs, conjunctiva no swelling or erythema ENT/Mouth: Ear canals normal without obstruction, swelling, erythema, or discharge.  TMs normal bilaterally with no erythema, bulging, retraction, or loss of landmark.  Oropharynx moist and clear with no exudate, erythema, or swelling.   Neck: Supple, thyroid normal. No bruits.  No cervical adenopathy Respiratory: Respiratory effort normal, Breath sounds clear A&P without  wheeze, rhonchi, rales.   Cardio: RRR without murmurs, rubs or gallops. Brisk peripheral pulses without edema.  Chest: symmetric, with normal excursions Breasts: Symmetric, without lumps, nipple discharge, retractions.  Abdomen: Soft, nontender, no guarding, rebound, hernias, masses, or organomegaly.  Lymphatics: Non tender without lymphadenopathy.  Musculoskeletal: Full ROM all peripheral extremities,5/5 strength, and normal gait.  Skin: Warm, dry without rashes, lesions, ecchymosis. Neuro: Awake and oriented X 3, Cranial nerves intact, reflexes equal bilaterally. Normal muscle tone, no cerebellar symptoms. Sensation intact.  Psych:  normal affect, Insight and Judgment appropriate.   EKG: WNL no ST changes, nonspecific t wave inversion unchanged V3-V5.   Over 40 minutes of exam, counseling, chart review and critical decision making was performed  Vicie Mutters 11:23 AM [facility]

## 2017-02-13 ENCOUNTER — Ambulatory Visit: Payer: BC Managed Care – PPO | Admitting: Physician Assistant

## 2017-02-13 ENCOUNTER — Encounter: Payer: Self-pay | Admitting: Physician Assistant

## 2017-02-13 VITALS — BP 124/76 | HR 103 | Temp 97.4°F | Resp 16 | Ht 67.0 in | Wt 212.6 lb

## 2017-02-13 DIAGNOSIS — Z0001 Encounter for general adult medical examination with abnormal findings: Secondary | ICD-10-CM

## 2017-02-13 DIAGNOSIS — J301 Allergic rhinitis due to pollen: Secondary | ICD-10-CM

## 2017-02-13 DIAGNOSIS — Z136 Encounter for screening for cardiovascular disorders: Secondary | ICD-10-CM

## 2017-02-13 DIAGNOSIS — R6889 Other general symptoms and signs: Secondary | ICD-10-CM

## 2017-02-13 DIAGNOSIS — Z79899 Other long term (current) drug therapy: Secondary | ICD-10-CM

## 2017-02-13 DIAGNOSIS — N760 Acute vaginitis: Secondary | ICD-10-CM

## 2017-02-13 DIAGNOSIS — R7309 Other abnormal glucose: Secondary | ICD-10-CM | POA: Diagnosis not present

## 2017-02-13 DIAGNOSIS — R030 Elevated blood-pressure reading, without diagnosis of hypertension: Secondary | ICD-10-CM

## 2017-02-13 DIAGNOSIS — K589 Irritable bowel syndrome without diarrhea: Secondary | ICD-10-CM

## 2017-02-13 DIAGNOSIS — E559 Vitamin D deficiency, unspecified: Secondary | ICD-10-CM | POA: Diagnosis not present

## 2017-02-13 DIAGNOSIS — E782 Mixed hyperlipidemia: Secondary | ICD-10-CM

## 2017-02-13 DIAGNOSIS — F329 Major depressive disorder, single episode, unspecified: Secondary | ICD-10-CM

## 2017-02-13 DIAGNOSIS — Z6833 Body mass index (BMI) 33.0-33.9, adult: Secondary | ICD-10-CM

## 2017-02-13 DIAGNOSIS — K5732 Diverticulitis of large intestine without perforation or abscess without bleeding: Secondary | ICD-10-CM | POA: Diagnosis not present

## 2017-02-13 DIAGNOSIS — F411 Generalized anxiety disorder: Secondary | ICD-10-CM

## 2017-02-13 MED ORDER — BUPROPION HCL ER (XL) 150 MG PO TB24: 150.0000 mg | ORAL_TABLET | ORAL | 2 refills | Status: DC

## 2017-02-13 MED ORDER — FLUCONAZOLE 150 MG PO TABS: 150.0000 mg | ORAL_TABLET | Freq: Every day | ORAL | 3 refills | Status: DC

## 2017-02-13 NOTE — Patient Instructions (Addendum)
Being dehydrated can hurt your kidneys, cause fatigue, headaches, muscle aches, joint pain, and dry skin/nails so please increase your fluids.   Drink 80-100 oz a day of water, measure it out! Eat 3 meals a day, have to do breakfast, eat protein- hard boiled eggs, protein bar like nature valley protein bar, greek yogurt like oikos triple zero, chobani 100, or light n fit greek  Can check out plantnanny app on your phone to help you keep track of your water    We want weight loss that will last so you should lose 1-2 pounds a week.  THAT IS IT! Please pick THREE things a month to change. Once it is a habit check off the item. Then pick another three items off the list to become habits.  If you are already doing a habit on the list GREAT!  Cross that item off! o Don't drink your calories. Ie, alcohol, soda, fruit juice, and sweet tea.  o Drink more water. Drink a glass when you feel hungry or before each meal.  o Eat breakfast - Complex carb and protein (likeDannon light and fit yogurt, oatmeal, fruit, eggs, [place] bacon). o Measure your cereal.  Eat no more than one cup a day. (ie [place]) o Eat an apple a day. o Add a vegetable a day. o Try a new vegetable a month. o Use Pam! Stop using oil or butter to cook. o Don't finish your plate or use smaller plates. o Share your dessert. o Eat sugar free Jello for dessert or frozen grapes. o Don't eat 2-3 hours before bed. o Switch to whole wheat bread, pasta, and brown rice. o Make healthier choices when you eat out. No fries! o Pick baked chicken, NOT fried. o Don't forget to SLOW DOWN when you eat. It is not going anywhere.  o Take the stairs. o Park far away in the parking lot o [employer] (or weights) for 10 minutes while watching TV. o Walk at work for 10 minutes during break. o Walk outside 1 time a week with your friend, kids, dog, or significant other. o Start a walking group at [place] the mall as much as you can tolerate.   o Keep a food diary. o Weigh yourself daily. o Walk for 15 minutes 3 days per week. o Cook at home more often and eat out less.  If life happens and you go back to old habits, it is okay.  Just start over. You can do it!   If you experience chest pain, get short of breath, or tired during the exercise, please stop immediately and inform your doctor.   VENOUS INSUFFICIENCY Our lower leg venous system is not the most reliable, the heart does NOT pump fluid up, there is a valve system.  The muscles of the leg squeeze and the blood moves up and a valve opens and close, then they squeeze, blood moves up and valves open and closes keeping the blood moving towards the heart.  Lots can go wrong with this valve system.  If someone is sitting or standing without movement, everyone will get swelling.  THINGS TO DO:  Do not stand or sit in one position for long periods of time. Do not sit with your legs crossed. Rest with your legs raised during the day.  Your legs have to be higher than your heart so that gravity will force the valves to open, so please really elevate your legs.   Wear elastic  stockings or support hose. Do not wear other tight, encircling garments around the legs, pelvis, or waist.  ELASTIC THERAPY  has a wide variety of well priced compression stockings. [place], [place] [address] [id number] [place] has a good cheap selection, I like the socks, they are not as hard to get on  Walk as much as possible to increase blood flow.  Raise the foot of your bed at night with 2-inch blocks.  SEEK MEDICAL CARE IF:   The skin around your ankle starts to break down.  You have pain, redness, tenderness, or hard swelling developing in your leg over a vein.  You are uncomfortable due to leg pain.  If you ever have shortness of breath with exertion or chest pain go to the ER.   Who Qualifies for Obesity Medications? Although everyone is hopeful for a fast and  easy way to lose weight, nothing has been shown to replace a prudent, calorie-controlled diet along with behavior modification as a cornerstone for all obesity treatments.  The next tool that can be used to achieve weight-loss and health improvement is medication. Pharmacotherapy may be offered to individuals affected by obesity who have failed to achieve weight-loss through diet and exercise alone. Currently there are several drugs that are approved by the FDA for weight-loss: phentermine products (Adipex-P or Suprenza)  lorcaserin HCI (Belviq) phentermine- topiramate ER (Qsymia)  Bupropion; Naltrexone ER (Contrave)  Saxenda (liraglutide), once a day weight loss shot Let's take a closer look at each of these medications and learn how they work:  Phentermine (Adipex-P or Suprenza) How does it work? Phentermine is a medication available by prescription that works on chemicals in the brain to decrease your appetite. It also has a mild stimulant component that adds extra energy. Phentermine is a pill that is taken once a day in the morning time. Tolerance to this medication can develop, so it can only be used for several months at a time. Common side effects are dry mouth, sleeplessness, constipation. Weight-loss: The average weight-loss is 4-5 percent of your weight after one-year. In a 200 pound person, this means about 10 pounds of weight-loss. Patients who receive phentermine can usually expect to see greater weight-loss than those who receive non-pharmacologic care, on average about 13 pounds difference over 12 weeks as reported in one study. Concerns: Due to its stimulant effect, a person's blood pressure and heart rate may increase when on this medication; therefore, you must be monitored closely by a physician who is experienced in prescribing this medication. It cannot be used in patients with some heart conditions (such as poorly controlled blood pressure), glaucoma (increased pressure in  your eye), stroke or overactive thyroid. There is some concern for abuse, but this is minimal if the medication is appropriately used as directed by a healthcare professional.  Lorcaserin (Belviq) How does it work? Lorcaserin was approved in June 2012 by the FDA and became commercially available in June 2013. It works by helping you feel full while eating less, and it works on the chemicals in your brain to help decrease your appetite. Weight-loss: In individuals who took the medication for one-year, it has been shown to have an average of 7 percent weight-loss. In a 200 pound person, this would mean a 14 pound weight-loss. Blood sugar, cholesterol and blood pressure levels have also been shown to improve. Concerns: The most common side effects are headache, dizziness, fatigue, dry mouth, upper TGG:YIRSWNIO tract infection and nausea.  Response to therapy should be evaluated by week 12.  If a patient has not lost at least 5% of baseline body weigh  Phentermine-Topiramate ER (Qsymia) How does it work? This combination medication was approved by the FDA in July 2012. Topiramate is a medication used to treat seizures. It was found that a common side effect of this medication was weight-loss. Phentermine, as described in this brochure, helps to increase your energy and decrease your appetite. Weight-loss: Among individuals who took the highest does of Qsymia (15 mg phentermine and 92 mg of topiramate ER) for one-year, they achieved an average of 14.4 percent weight-loss. In a 200 pound person, a 14.4 percent weight-loss would mean a loss of 29 pounds. Cholesterol levels have also been shown to improve. Concerns: The most common side effects were dry mouth, constipation and pins-and-needle feeling in extremities. Qsymia should NOT be taken during pregnancy since Topiramate ER, a component of Qsymia, has been associated with an increased risk of birth defects.  Bupropion; Naltrexone ER (Contrave) How  does it work? Works in two areas of your brain, hunger center and reward center to reduce hunger and cravings.  Weight loss In a 56 week trial patients lost more than 5% of their body weight.  Concerns Most common side effects are dry mouth, constipation or diarrhea, headache.  Please take it with a full glass of water and low fat meal.      Follow-up Visits: Patients are given the opportunity to revisit a topic or obtain more information on an area of interest during follow-up visits. The frequency of and interval between follow-up visits is determined on a patient-by-patient basis. Frequent visits (every 3 to 4 weeks) are encouraged until initial weight-loss goals (5 to 10 percent of body weight) are achieved. At that point, less frequent visits are typically scheduled as needed for individual patients. However, since obesity is considered a chronic life-long problem for many individuals, periodic continual follow up is recommended.   Research has shown that weight-loss as low as 5 percent of initial body weight can lead to favorable improvements in blood pressure, cholesterol, glucose levels and insulin sensitivity. The risk of developing heart disease is reduced the most in patients who have impaired glucose tolerance, type 2 diabetes or high blood pressure.

## 2017-02-14 LAB — BASIC METABOLIC PANEL WITH GFR
BUN: 13 mg/dL (ref 7–25)
CO2: 31 mmol/L (ref 20–32)
CREATININE: 0.83 mg/dL (ref 0.50–0.99)
Calcium: 9.8 mg/dL (ref 8.6–10.4)
Chloride: 105 mmol/L (ref 98–110)
GFR, Est African American: 86 mL/min/{1.73_m2} (ref >=60)
GFR, Est Non African American: 75 mL/min/{1.73_m2} (ref >=60)
GLUCOSE: 86 mg/dL (ref 65–99)
Potassium: 4.6 mmol/L (ref 3.5–5.3)
Sodium: 144 mmol/L (ref 135–146)

## 2017-02-14 LAB — LIPID PANEL
CHOL/HDL RATIO: 4.3 (calc) (ref <5.0)
CHOLESTEROL: 167 mg/dL (ref <200)
HDL: 39 mg/dL — AB (ref >50)
LDL CHOLESTEROL (CALC): 102 mg/dL — AB
Non-HDL Cholesterol (Calc): 128 mg/dL (calc) (ref <130)
Triglycerides: 156 mg/dL — ABNORMAL HIGH (ref <150)

## 2017-02-14 LAB — HEPATIC FUNCTION PANEL
AG RATIO: 1.8 (calc) (ref 1.0–2.5)
ALT: 8 U/L (ref 6–29)
AST: 15 U/L (ref 10–35)
Albumin: 4.4 g/dL (ref 3.6–5.1)
Alkaline phosphatase (APISO): 128 U/L (ref 33–130)
Bilirubin, Direct: 0.1 mg/dL (ref 0.0–0.2)
GLOBULIN: 2.5 g/dL (ref 1.9–3.7)
Indirect Bilirubin: 0.5 mg/dL (calc) (ref 0.2–1.2)
TOTAL PROTEIN: 6.9 g/dL (ref 6.1–8.1)
Total Bilirubin: 0.6 mg/dL (ref 0.2–1.2)

## 2017-02-14 LAB — MICROALBUMIN / CREATININE URINE RATIO
Creatinine, Urine: 69 mg/dL (ref 20–275)
MICROALB UR: 0.5 mg/dL
MICROALB/CREAT RATIO: 7 ug/mg{creat} (ref <30)

## 2017-02-14 LAB — URINALYSIS, ROUTINE W REFLEX MICROSCOPIC
Bilirubin Urine: NEGATIVE
GLUCOSE, UA: NEGATIVE
HGB URINE DIPSTICK: NEGATIVE
Ketones, ur: NEGATIVE
LEUKOCYTES UA: NEGATIVE
NITRITE: NEGATIVE
PH: 6.5 (ref 5.0–8.0)
Protein, ur: NEGATIVE
SPECIFIC GRAVITY, URINE: 1.01 (ref 1.001–1.03)

## 2017-02-14 LAB — CBC WITH DIFFERENTIAL/PLATELET
BASOS ABS: 41 {cells}/uL (ref 0–200)
Basophils Relative: 0.9 %
EOS ABS: 258 {cells}/uL (ref 15–500)
Eosinophils Relative: 5.6 %
HEMATOCRIT: 40.5 % (ref 35.0–45.0)
HEMOGLOBIN: 13.4 g/dL (ref 11.7–15.5)
LYMPHS ABS: 1109 {cells}/uL (ref 850–3900)
MCH: 29.7 pg (ref 27.0–33.0)
MCHC: 33.1 g/dL (ref 32.0–36.0)
MCV: 89.8 fL (ref 80.0–100.0)
MONOS PCT: 7.6 %
MPV: 10.9 fL (ref 7.5–12.5)
NEUTROS ABS: 2843 {cells}/uL (ref 1500–7800)
Neutrophils Relative %: 61.8 %
Platelets: 197 10*3/uL (ref 140–400)
RBC: 4.51 10*6/uL (ref 3.80–5.10)
RDW: 12.4 % (ref 11.0–15.0)
Total Lymphocyte: 24.1 %
WBC mixed population: 350 cells/uL (ref 200–950)
WBC: 4.6 10*3/uL (ref 3.8–10.8)

## 2017-02-14 LAB — TSH: TSH: 3.39 mIU/L (ref 0.40–4.50)

## 2017-02-14 LAB — VITAMIN D 25 HYDROXY (VIT D DEFICIENCY, FRACTURES): Vit D, 25-Hydroxy: 42 ng/mL (ref 30–100)

## 2017-02-14 LAB — MAGNESIUM: Magnesium: 2.1 mg/dL (ref 1.5–2.5)

## 2017-03-04 HISTORY — PX: KNEE ARTHROSCOPY: SHX127

## 2017-03-09 ENCOUNTER — Encounter: Payer: Self-pay | Admitting: Physician Assistant

## 2017-03-10 ENCOUNTER — Other Ambulatory Visit: Payer: Self-pay | Admitting: Physician Assistant

## 2017-03-10 MED ORDER — ESCITALOPRAM OXALATE 10 MG PO TABS: 10.0000 mg | ORAL_TABLET | Freq: Every day | ORAL | 2 refills | Status: DC

## 2017-03-12 ENCOUNTER — Other Ambulatory Visit: Payer: Self-pay | Admitting: Physician Assistant

## 2017-03-20 ENCOUNTER — Ambulatory Visit: Payer: BC Managed Care – PPO | Admitting: Physician Assistant

## 2017-03-20 ENCOUNTER — Encounter: Payer: Self-pay | Admitting: Physician Assistant

## 2017-03-20 VITALS — BP 122/74 | HR 88 | Temp 97.5°F | Ht 67.0 in | Wt 212.6 lb

## 2017-03-20 DIAGNOSIS — J0100 Acute maxillary sinusitis, unspecified: Secondary | ICD-10-CM | POA: Diagnosis not present

## 2017-03-20 MED ORDER — PROMETHAZINE-DM 6.25-15 MG/5ML PO SYRP: 5.0000 mL | ORAL_SOLUTION | Freq: Four times a day (QID) | ORAL | 1 refills | Status: DC | PRN

## 2017-03-20 MED ORDER — AMOXICILLIN-POT CLAVULANATE 875-125 MG PO TABS: 1.0000 | ORAL_TABLET | Freq: Two times a day (BID) | ORAL | 0 refills | Status: DC

## 2017-03-20 MED ORDER — PREDNISONE 20 MG PO TABS: ORAL_TABLET | ORAL | 0 refills | Status: DC

## 2017-03-20 NOTE — Progress Notes (Signed)
Subjective:    Patient ID: Teresa Vasquez, female    DOB: [birth year], 67 y.o.   MRN: [id number]  HPI 67 y.o. WF with history of HTN presents with URI x 1 week and not improving. She has sore throat, dry cough with wheezing, HA, sinus pressure, teeth pain, some chills/subjective fever.  She has been painting the living room, but stopped due to feeling bad. No SOB, CP, neck pain, sensitivity to light/sound.    Blood pressure 122/74, pulse 88, temperature (!) 97.5 F (36.4 C), height 5\' 7"  (1.702 m), weight 212 lb 9.6 oz (96.4 kg), SpO2 96 %.  Medications Current Outpatient Medications on File Prior to Visit  Medication Sig  . ALPRAZolam (XANAX) 1 MG tablet Take 1/2 to 1 tablet 1 or 2 x / day ONLY if needed for Anxiety  . aspirin 81 MG tablet Take 81 mg by mouth daily.   Marland Kitchen atorvastatin (LIPITOR) 20 MG tablet TAKE 1 TABLET BY MOUTH DAILY  . escitalopram (LEXAPRO) 10 MG tablet Take 1 tablet (10 mg total) by mouth daily.  . fluticasone (FLONASE) 50 MCG/ACT nasal spray USE 2 SPRAYS INTO EACH NOSTRIL ONCE DAILY  . omeprazole (PRILOSEC) 40 MG capsule TAKE 1 CAPSULE BY MOUTH ONCE DAILY  . dicyclomine (BENTYL) 20 MG tablet Take 1 tablet (20 mg total) by mouth 2 (two) times daily.   No current facility-administered medications on file prior to visit.     Problem list She has Anxiety state; Depression; DIVERTICULITIS; IRRITABLE BOWEL SYNDROME; Allergic rhinitis; Vitamin D deficiency; Elevated blood pressure reading without diagnosis of hypertension; Other abnormal glucose; and Mixed hyperlipidemia on their problem list.  Review of Systems  Constitutional: Negative for chills and diaphoresis.  HENT: Positive for congestion, postnasal drip, sinus pressure and sneezing. Negative for ear pain and sore throat.   Respiratory: Positive for cough. Negative for chest tightness, shortness of breath and wheezing.   Cardiovascular: Negative.   Gastrointestinal: Negative.   Genitourinary: Negative.    Musculoskeletal: Negative for neck pain.  Neurological: Positive for headaches.       Objective:   Physical Exam  Constitutional: She appears well-developed and well-nourished.  HENT:  Head: Normocephalic and atraumatic.  Right Ear: External ear normal.  Nose: Right sinus exhibits maxillary sinus tenderness. Right sinus exhibits no frontal sinus tenderness. Left sinus exhibits maxillary sinus tenderness. Left sinus exhibits no frontal sinus tenderness.  Eyes: Conjunctivae and EOM are normal.  Neck: Normal range of motion. Neck supple.  Cardiovascular: Normal rate, regular rhythm, normal heart sounds and intact distal pulses.  Pulmonary/Chest: Effort normal and breath sounds normal. No respiratory distress. She has no wheezes.  Abdominal: Soft. Bowel sounds are normal.  Lymphadenopathy:    She has cervical adenopathy.  Skin: Skin is warm and dry.       Assessment & Plan:   Acute non-recurrent maxillary sinusitis -     predniSONE (DELTASONE) 20 MG tablet; 2 tablets daily for 3 days, 1 tablet daily for 4 days. -     promethazine-dextromethorphan (PROMETHAZINE-DM) 6.25-15 MG/5ML syrup; Take 5 mLs by mouth 4 (four) times daily as needed for cough. -     amoxicillin-clavulanate (AUGMENTIN) 875-125 MG tablet; Take 1 tablet by mouth 2 (two) times daily. 7 days   The patient was advised to call immediately if she has any concerning symptoms in the interval. The patient voices understanding of current treatment options and is in agreement with the current care plan.The patient knows to call the clinic with  any problems, questions or concerns or go to the ER if any further progression of symptoms.   Future Appointments  Date Time Provider Ronda  08/28/2017  2:00 PM Liane Comber, NP [facility] None  02/26/2018 10:00 AM Vicie Mutters, PA-C [facility] None

## 2017-03-20 NOTE — Patient Instructions (Signed)
Make sure you are on an allergy pill, see below for more details. Please take the prednisone as directed below, this is NOT an antibiotic so you do NOT have to finish it. You can take it for a few days and stop it if you are doing better.   Please take the prednisone to help decrease inflammation and therefore decrease symptoms. Take it it with food to avoid GI upset. It can cause increased energy but on the other hand it can make it hard to sleep at night so please take it AT [place], it takes 8-12 hours to start working so it will NOT affect your sleeping if you take it at night with your food!!  If you are diabetic it will increase your sugars so decrease carbs and monitor your sugars closely.     HOW TO TREAT VIRAL COUGH AND COLD SYMPTOMS:  -Symptoms usually last at least 1 week with the worst symptoms being around day 4.  - colds usually start with a sore throat and end with a cough, and the cough can take 2 weeks to get better.  -No antibiotics are needed for colds, flu, sore throats, cough, bronchitis UNLESS symptoms are longer than 7 days OR if you are getting better then get drastically worse.  -There are a lot of combination medications (Dayquil, Nyquil, Vicks 44, tyelnol cold and sinus, ETC). Please look at the ingredients on the back so that you are treating the correct symptoms and not doubling up on medications/ingredients.    Medicines you can use  Nasal congestion  - pseudoephedrine (Sudafed)- behind the counter, do not use if you have high blood pressure, medicine that have -D in them.  - phenylephrine (Sudafed PE) -Dextormethorphan + chlorpheniramine (Coridcidin HBP)- okay if you have high blood pressure -Oxymetazoline (Afrin) nasal spray- LIMIT to 3 days -Saline nasal spray -Neti pot (used distilled or bottled water)  Ear pain/congestion  -pseudoephedrine (sudafed) - Nasonex/flonase nasal spray  Fever  -Acetaminophen (Tyelnol) -Ibuprofen (Advil, motrin,  aleve)  Sore Throat  -Acetaminophen (Tyelnol) -Ibuprofen (Advil, motrin, aleve) -Drink a lot of water -Gargle with salt water - Rest your voice (don't talk) -Throat sprays -Cough drops  Body Aches  -Acetaminophen (Tyelnol) -Ibuprofen (Advil, motrin, aleve)  Headache  -Acetaminophen (Tyelnol) -Ibuprofen (Advil, motrin, aleve) - Exedrin, Exedrin Migraine  Allergy symptoms (cough, sneeze, runny nose, itchy eyes) -Claritin or loratadine cheapest but likely the weakest  -Zyrtec or certizine at night because it can make you sleepy -The strongest is allegra or fexafinadine  Cheapest at [employer], [employer], [employer]  Cough  -Dextromethorphan (Delsym)- medicine that has DM in it -Guafenesin (Mucinex/Robitussin) - cough drops - drink lots of water  Chest Congestion  -Guafenesin (Mucinex/Robitussin)  Red Itchy Eyes  - Naphcon-A  Upset Stomach  - Bland diet (nothing spicy, greasy, fried, and high acid foods like tomatoes, oranges, berries) -OKAY- cereal, bread, soup, crackers, rice -Eat smaller more frequent meals -reduce caffeine, no alcohol -Loperamide (Imodium-AD) if diarrhea -Prevacid for heart burn  General health when sick  -Hydration -wash your hands frequently -keep surfaces clean -change pillow cases and sheets often -Get fresh air but do not exercise strenuously -Vitamin D, double up on it - Vitamin C -Zinc   Take omeprazole over the counter for 2 weeks, then go to zantac 150-300 mg OR pepcid 20 or 40mg  at night for 2 weeks, then you can stop or continue as needed.  Avoid alcohol, spicy foods, NSAIDS (aleve, ibuprofen) at this time. See  foods below.   Food Choices for Gastroesophageal Reflux Disease When you have gastroesophageal reflux disease (GERD), the foods you eat and your eating habits are very important. Choosing the right foods can help ease the discomfort of GERD. WHAT GENERAL GUIDELINES DO I NEED TO FOLLOW?  Choose fruits, vegetables, whole grains,  low-fat dairy products, and low-fat meat, fish, and poultry.  Limit fats such as oils, salad dressings, butter, nuts, and avocado.  Keep a food diary to identify foods that cause symptoms.  Avoid foods that cause reflux. These may be different for different people.  Eat frequent small meals instead of three large meals each day.  Eat your meals slowly, in a relaxed setting.  Limit fried foods.  Cook foods using methods other than frying.  Avoid drinking alcohol.  Avoid drinking large amounts of liquids with your meals.  Avoid bending over or lying down until 2-3 hours after eating. WHAT FOODS ARE NOT RECOMMENDED? The following are some foods and drinks that may worsen your symptoms: Vegetables Tomatoes. Tomato juice. Tomato and spaghetti sauce. Chili peppers. Onion and garlic. Horseradish. Fruits Oranges, grapefruit, and lemon (fruit and juice). Meats High-fat meats, fish, and poultry. This includes hot dogs, ribs, ham, sausage, salami, and bacon. Dairy Whole milk and chocolate milk. Sour cream. Cream. Butter. Ice cream. Cream cheese.  Beverages Coffee and tea, with or without caffeine. Carbonated beverages or energy drinks. Condiments Hot sauce. Barbecue sauce.  Sweets/Desserts Chocolate and cocoa. Donuts. Peppermint and spearmint. Fats and Oils High-fat foods, including [place] fries and potato chips. Other Vinegar. Strong spices, such as black pepper, white pepper, red pepper, cayenne, curry powder, cloves, ginger, and chili powder.

## 2017-04-02 ENCOUNTER — Ambulatory Visit: Payer: Self-pay | Admitting: Adult Health

## 2017-04-02 ENCOUNTER — Ambulatory Visit: Payer: BC Managed Care – PPO | Admitting: Adult Health

## 2017-04-02 ENCOUNTER — Encounter: Payer: Self-pay | Admitting: Adult Health

## 2017-04-02 ENCOUNTER — Ambulatory Visit (HOSPITAL_COMMUNITY)
Admission: RE | Admit: 2017-04-02 | Discharge: 2017-04-02 | Disposition: A | Discharge status: Home / Self Care | Payer: BC Managed Care – PPO | Source: Ambulatory Visit | Attending: Adult Health | Admitting: Adult Health

## 2017-04-02 VITALS — BP 126/76 | HR 92 | Temp 97.3°F | Ht 67.0 in | Wt 210.0 lb

## 2017-04-02 DIAGNOSIS — J0100 Acute maxillary sinusitis, unspecified: Secondary | ICD-10-CM | POA: Diagnosis not present

## 2017-04-02 DIAGNOSIS — R0689 Other abnormalities of breathing: Secondary | ICD-10-CM

## 2017-04-02 DIAGNOSIS — R059 Cough, unspecified: Secondary | ICD-10-CM

## 2017-04-02 DIAGNOSIS — R05 Cough: Secondary | ICD-10-CM | POA: Diagnosis present

## 2017-04-02 DIAGNOSIS — R062 Wheezing: Secondary | ICD-10-CM | POA: Diagnosis not present

## 2017-04-02 DIAGNOSIS — J209 Acute bronchitis, unspecified: Secondary | ICD-10-CM

## 2017-04-02 MED ORDER — DOXYCYCLINE HYCLATE 100 MG PO TABS: 100.0000 mg | ORAL_TABLET | Freq: Two times a day (BID) | ORAL | 0 refills | Status: AC

## 2017-04-02 MED ORDER — PREDNISONE 20 MG PO TABS: ORAL_TABLET | ORAL | 0 refills | Status: DC

## 2017-04-02 NOTE — Patient Instructions (Signed)

## 2017-04-02 NOTE — Progress Notes (Signed)
Assessment and Plan:  Acute bronchitis, unspecified organism Progressed from URI - ongoing 3 weeks with wheezing, + chills  -     predniSONE (DELTASONE) 20 MG tablet; 2 tablets daily for 3 days, 1 tablet daily for 4 days. -     doxycycline (VIBRA-TABS) 100 MG tablet; Take 1 tablet (100 mg total) by mouth 2 (two) times daily for 7 days. -     DG Chest 2 View; Future Continue with allergy medication, mucinex/guaifenesin, flonase daily Continue albuterol nebs as needed for wheezing Suggested symptomatic OTC remedies. Nasal saline spray for congestion. Follow up as needed. Present to ER for shortness of breath, chest pain, other sudden severe symptoms  Further disposition pending results of labs. Discussed med's effects and SE's.   Over 15 minutes of exam, counseling, chart review, and critical decision making was performed.   Future Appointments  Date Time Provider Kaycee  08/28/2017  2:00 PM Liane Comber, NP [facility] None  02/26/2018 10:00 AM Vicie Mutters, PA-C [facility] None    ------------------------------------------------------------------------------------------------------------------   HPI BP 126/76   Pulse 92   Temp (!) 97.3 F (36.3 C)   Ht 5\' 7"  (1.702 m)   Wt 210 lb (95.3 kg)   SpO2 96%   BMI 32.89 kg/m   67 y.o.female presents for ongoing URI symptoms - facial pressure, dry throat, mildly productive cough ongoing now for 3 weeks or so - cough has progressed significantly though remains mostly non-productive.  She was seen at this office on 1/17 and diagnosed with acute maxillary sinusitis - prescribed Flonase, augmentin, prednisone, promethazine - DM  - she completed this medications and did help symptoms somewhat at first, but has nagging ongoing symptoms. She enodrses fatigue and intermittent chills without fever. She denies diaphoresis, dyspnea, chest pain. She endorses some wheezing at home after severe bouts of coughing, sense that "can't get a  deep breath" after coughing spells and has used nebulized albuterol a few times over the past week. Speaks in complete sentences, not in acute distress.     Past Medical History:  Diagnosis Date  . Anxiety and depression   . Depression   . Diverticulitis   . Hyperlipidemia   . Hypertension   . Irritable bowel syndrome      Allergies  Allergen Reactions  . Levofloxacin     Current Outpatient Medications on File Prior to Visit  Medication Sig  . aspirin 81 MG tablet Take 81 mg by mouth daily.   Marland Kitchen atorvastatin (LIPITOR) 20 MG tablet TAKE 1 TABLET BY MOUTH DAILY  . dicyclomine (BENTYL) 20 MG tablet Take 1 tablet (20 mg total) by mouth 2 (two) times daily. (Patient taking differently: Take 20 mg by mouth as needed. )  . escitalopram (LEXAPRO) 10 MG tablet Take 1 tablet (10 mg total) by mouth daily.  . fluticasone (FLONASE) 50 MCG/ACT nasal spray USE 2 SPRAYS INTO EACH NOSTRIL ONCE DAILY  . omeprazole (PRILOSEC) 40 MG capsule TAKE 1 CAPSULE BY MOUTH ONCE DAILY  . promethazine-dextromethorphan (PROMETHAZINE-DM) 6.25-15 MG/5ML syrup Take 5 mLs by mouth 4 (four) times daily as needed for cough.  Marland Kitchen amoxicillin-clavulanate (AUGMENTIN) 875-125 MG tablet Take 1 tablet by mouth 2 (two) times daily. 7 days (Patient not taking: Reported on 04/02/2017)  . predniSONE (DELTASONE) 20 MG tablet 2 tablets daily for 3 days, 1 tablet daily for 4 days. (Patient not taking: Reported on 04/02/2017)   No current facility-administered medications on file prior to visit.     ROS: Review  of Systems  Constitutional: Negative for chills, diaphoresis, fever and malaise/fatigue.  HENT: Positive for congestion. Negative for ear discharge, ear pain, hearing loss, sinus pain, sore throat and tinnitus.   Eyes: Negative for blurred vision, pain, discharge and redness.  Respiratory: Positive for cough and wheezing. Negative for hemoptysis, sputum production, shortness of breath and stridor.   Cardiovascular: Negative  for chest pain, palpitations and orthopnea.  Gastrointestinal: Positive for nausea. Negative for abdominal pain, diarrhea and vomiting.  Genitourinary: Negative.   Musculoskeletal: Negative for joint pain and myalgias.  Skin: Negative for rash.  Neurological: Negative for dizziness, sensory change, weakness and headaches.  Endo/Heme/Allergies: Negative for environmental allergies.  Psychiatric/Behavioral: Negative.   All other systems reviewed and are negative.    Physical Exam:  BP 126/76   Pulse 92   Temp (!) 97.3 F (36.3 C)   Ht 5\' 7"  (1.702 m)   Wt 210 lb (95.3 kg)   SpO2 96%   BMI 32.89 kg/m   General Appearance: Well nourished, in no apparent distress. Eyes: PERRLA, EOMs, conjunctiva no swelling or erythema Sinuses: No Frontal/maxillary tenderness ENT/Mouth: Ext aud canals clear, TMs without erythema, bulging. No erythema, swelling, or exudate on post pharynx.  Tonsils not swollen or erythematous. Hearing normal.  Neck: Supple, thyroid normal.  Respiratory: Respiratory effort normal, BS equal bilaterally with scattered rhonchi and rales, without fine wheezing or stridor.  Cardio: RRR with no MRGs. Brisk peripheral pulses without edema.  Abdomen: Soft, + BS.  Non tender, no guarding, rebound, hernias, masses. Lymphatics: Non tender without lymphadenopathy.  Musculoskeletal: Full ROM, 5/5 strength, normal gait.  Skin: Warm, dry without rashes, lesions, ecchymosis.  Neuro: Cranial nerves intact. Normal muscle tone, no cerebellar symptoms. Sensation intact.  Psych: Awake and oriented X 3, normal affect, Insight and Judgment appropriate.     [name], NP 1:18 PM [facility]

## 2017-04-11 ENCOUNTER — Telehealth: Payer: Self-pay | Admitting: *Deleted

## 2017-04-11 NOTE — Telephone Encounter (Signed)
Patient called and continues to have chest congestion, even thouh she has taken 2 rounds of antibiotics. Per Liane Comber, NP, the patient's CXR was negative so she should use OTC Mucinex, allergy medication, and Flonase nasal spray.  The patient will try these recommendations and will call back PRN.

## 2017-04-24 ENCOUNTER — Other Ambulatory Visit: Payer: Self-pay | Admitting: Internal Medicine

## 2017-05-09 ENCOUNTER — Other Ambulatory Visit: Payer: Self-pay | Admitting: Internal Medicine

## 2017-07-17 ENCOUNTER — Other Ambulatory Visit: Payer: Self-pay | Admitting: Internal Medicine

## 2017-07-17 DIAGNOSIS — J0140 Acute pansinusitis, unspecified: Secondary | ICD-10-CM

## 2017-08-27 DIAGNOSIS — E66811 Obesity, class 1: Secondary | ICD-10-CM | POA: Insufficient documentation

## 2017-08-27 DIAGNOSIS — E669 Obesity, unspecified: Secondary | ICD-10-CM | POA: Insufficient documentation

## 2017-08-27 NOTE — Progress Notes (Signed)
FOLLOW UP  Assessment and Plan:   BP Monitor blood pressure at home; patient to call if consistently greater than 130/80 Continue DASH diet.   Reminder to go to the ER if any CP, SOB, nausea, dizziness, severe HA, changes vision/speech, left arm numbness and tingling and jaw pain.  Cholesterol Currently near goal; continue atorvastatin Continue low cholesterol diet and exercise.  Check lipid panel.   Other abnormal glucose Recent A1Cs at goal Discussed diet/exercise, weight management  Defer A1C; check BMP  Obesity with co morbidities Long discussion about weight loss, diet, and exercise Recommended diet heavy in fruits and veggies and low in animal meats, cheeses, and dairy products, appropriate calorie intake Discussed ideal weight for height and initial weight goal  Will follow up in 3 months  Vitamin D Def Below goal at last visit Continue supplementation to maintain goal of 70-100 Check Vit D level  Depression/anxiety Continue medications; reminded to limit use of benzo to avoid tolerance and addiction;  Lifestyle discussed: diet/exerise, sleep hygiene, stress management, hydration Will have her work on night-time routine, declines alternate agents/daily agent to try to taper today and quite defensive, will continue to discuss at follow up appointments -  Discussed new guidelines suggest the benzodiazepines are best short term, with prolonged use they lead to physical and psychological dependence. In addition, evidence suggest that for insomnia the effectiveness wanes in 4 weeks and the risks out weight their benefits. Use of these agents have been associated with dementia, falls, motor vehicle accidents and physical addiction. Decreasing these medication have been proven to show improvements in cognition, alertness, decrease of falls and daytime sedation.    Continue diet and meds as discussed. Further disposition pending results of labs. Discussed med's effects and  SE's.   Over 30 minutes of exam, counseling, chart review, and critical decision making was performed.   Future Appointments  Date Time Provider Rake  02/26/2018 10:00 AM Vicie Mutters, PA-C [facility] None    ----------------------------------------------------------------------------------------------------------------------  HPI 67 y.o. female  presents for 3 month follow up on BP, cholesterol, glucose management, weight and vitamin D deficiency. She also c/o nasal congestion/sinus   she has a diagnosis of depression/anxiety, has been prescribed zoloft, lexapro in the past but reports she didn't do well on these, currently PRN xanax 0.5-1 mg BID, reports symptoms are well controlled on current regimen. she is currently taking xanax 1 mg at night and feels she is well controlled on this plan.   BMI is Body mass index is 33.14 kg/m., she has been working on diet, exercise limited by bilateral knee pain pending possible knee surgery. Wt Readings from Last 3 Encounters:  08/28/17 211 lb 9.6 oz (96 kg)  04/02/17 210 lb (95.3 kg)  03/20/17 212 lb 9.6 oz (96.4 kg)   Her blood pressure has been controlled at home, today their BP is BP: 128/80  She does workout. She denies chest pain, shortness of breath, dizziness.   She is on cholesterol medication (atorvastatin 20 mg daily) and denies myalgias. Her cholesterol is not at goal. The cholesterol last visit was:   Lab Results  Component Value Date   CHOL 167 02/13/2017   HDL 39 (L) 02/13/2017   LDLCALC 102 (H) 02/13/2017   TRIG 156 (H) 02/13/2017   CHOLHDL 4.3 02/13/2017    She has been working on diet for glucose management, and denies foot ulcerations, increased appetite, nausea, paresthesia of the feet, polydipsia, polyuria, visual disturbances, vomiting and weight loss. Last A1C  in the office was:  Lab Results  Component Value Date   HGBA1C 5.4 07/30/2016   Patient is on Vitamin D supplement.   Lab Results   Component Value Date   VD25OH 42 02/13/2017        Current Medications:  Current Outpatient Medications on File Prior to Visit  Medication Sig  . ALPRAZolam (XANAX) 1 MG tablet Take 1/2 to 1 tablet 2 x / day  ONLY if needed for Anxiety Attack and please try to limit to 5 days /week to avoid addiction  . aspirin 81 MG tablet Take 81 mg by mouth daily.   Marland Kitchen atorvastatin (LIPITOR) 20 MG tablet TAKE 1 TABLET BY MOUTH DAILY  . fluticasone (FLONASE) 50 MCG/ACT nasal spray USE 2 SPRAYS INTO EACH NOSTRIL ONCE DAILY  . omeprazole (PRILOSEC) 40 MG capsule TAKE 1 CAPSULE BY MOUTH ONCE DAILY  . dicyclomine (BENTYL) 20 MG tablet Take 1 tablet (20 mg total) by mouth 2 (two) times daily. (Patient taking differently: Take 20 mg by mouth as needed. )   No current facility-administered medications on file prior to visit.      Allergies:  Allergies  Allergen Reactions  . Levofloxacin      Medical History:  Past Medical History:  Diagnosis Date  . Anxiety and depression   . Depression   . Diverticulitis   . Hyperlipidemia   . Hypertension   . Irritable bowel syndrome    Family history- Reviewed and unchanged Social history- Reviewed and unchanged   Review of Systems:  Review of Systems  Constitutional: Negative for malaise/fatigue and weight loss.  HENT: Negative for hearing loss and tinnitus.   Eyes: Negative for blurred vision and double vision.  Respiratory: Negative for cough, shortness of breath and wheezing.   Cardiovascular: Negative for chest pain, palpitations, orthopnea, claudication and leg swelling.  Gastrointestinal: Negative for abdominal pain, blood in stool, constipation, diarrhea, heartburn, melena, nausea and vomiting.  Genitourinary: Negative.   Musculoskeletal: Negative for joint pain and myalgias.  Skin: Negative for rash.  Neurological: Negative for dizziness, tingling, sensory change, weakness and headaches.  Endo/Heme/Allergies: Negative for polydipsia.   Psychiatric/Behavioral: Negative for depression, substance abuse and suicidal ideas. The patient is nervous/anxious and has insomnia.   All other systems reviewed and are negative.     Physical Exam: BP 128/80   Pulse (!) 101   Temp 97.9 F (36.6 C)   Resp 16   Ht 5\' 7"  (1.702 m)   Wt 211 lb 9.6 oz (96 kg)   SpO2 98%   BMI 33.14 kg/m  Wt Readings from Last 3 Encounters:  08/28/17 211 lb 9.6 oz (96 kg)  04/02/17 210 lb (95.3 kg)  03/20/17 212 lb 9.6 oz (96.4 kg)   General Appearance: Well nourished, in no apparent distress. Eyes: PERRLA, EOMs, conjunctiva no swelling or erythema Sinuses: No Frontal/maxillary tenderness ENT/Mouth: Ext aud canals clear, TMs without erythema, bulging. No erythema, swelling, or exudate on post pharynx.  Tonsils not swollen or erythematous. Hearing normal.  Neck: Supple, thyroid normal.  Respiratory: Respiratory effort normal, BS equal bilaterally without rales, rhonchi, wheezing or stridor.  Cardio: RRR with no MRGs. Brisk peripheral pulses without edema.  Abdomen: Soft, + BS.  Non tender, no guarding, rebound, hernias, masses. Lymphatics: Non tender without lymphadenopathy.  Musculoskeletal: Full ROM, 5/5 strength, Normal gait Skin: Warm, dry without rashes, lesions, ecchymosis.  Neuro: Cranial nerves intact. No cerebellar symptoms.  Psych: Awake and oriented X 3, anxious affect, defensive, Insight  and Judgment appropriate.    [name], NP 2:18 PM [facility]

## 2017-08-28 ENCOUNTER — Ambulatory Visit: Payer: BLUE CROSS/BLUE SHIELD | Admitting: Adult Health

## 2017-08-28 ENCOUNTER — Encounter: Payer: Self-pay | Admitting: Adult Health

## 2017-08-28 VITALS — BP 128/80 | HR 101 | Temp 97.9°F | Resp 16 | Ht 67.0 in | Wt 211.6 lb

## 2017-08-28 DIAGNOSIS — J0100 Acute maxillary sinusitis, unspecified: Secondary | ICD-10-CM | POA: Diagnosis not present

## 2017-08-28 DIAGNOSIS — E669 Obesity, unspecified: Secondary | ICD-10-CM | POA: Diagnosis not present

## 2017-08-28 DIAGNOSIS — E782 Mixed hyperlipidemia: Secondary | ICD-10-CM

## 2017-08-28 DIAGNOSIS — R7309 Other abnormal glucose: Secondary | ICD-10-CM | POA: Diagnosis not present

## 2017-08-28 DIAGNOSIS — F411 Generalized anxiety disorder: Secondary | ICD-10-CM | POA: Diagnosis not present

## 2017-08-28 DIAGNOSIS — J209 Acute bronchitis, unspecified: Secondary | ICD-10-CM | POA: Diagnosis not present

## 2017-08-28 DIAGNOSIS — R030 Elevated blood-pressure reading, without diagnosis of hypertension: Secondary | ICD-10-CM

## 2017-08-28 DIAGNOSIS — Z79899 Other long term (current) drug therapy: Secondary | ICD-10-CM | POA: Diagnosis not present

## 2017-08-28 DIAGNOSIS — E66811 Obesity, class 1: Secondary | ICD-10-CM

## 2017-08-28 DIAGNOSIS — R350 Frequency of micturition: Secondary | ICD-10-CM

## 2017-08-28 DIAGNOSIS — E559 Vitamin D deficiency, unspecified: Secondary | ICD-10-CM | POA: Diagnosis not present

## 2017-08-28 DIAGNOSIS — F329 Major depressive disorder, single episode, unspecified: Secondary | ICD-10-CM | POA: Diagnosis not present

## 2017-08-28 MED ORDER — PREDNISONE 20 MG PO TABS: ORAL_TABLET | ORAL | 0 refills | Status: DC

## 2017-08-28 MED ORDER — PROMETHAZINE-DM 6.25-15 MG/5ML PO SYRP: 5.0000 mL | ORAL_SOLUTION | Freq: Four times a day (QID) | ORAL | 1 refills | Status: DC | PRN

## 2017-08-28 NOTE — Patient Instructions (Addendum)
Aim for 7+ servings of fruits and vegetables daily  80+ fluid ounces of water or unsweet tea for healthy kidneys  Limit alcohol intake, avoid smoking  Limit animal fats in diet for cholesterol and heart health - choose grass fed whenever available  Aim for low stress - take time to unwind and care for your mental health  Aim for 150 min of moderate intensity exercise weekly for heart health, and weights twice weekly for bone health  Aim for 7-9 hours of sleep daily    Can try melatonin 5mg -15 mg at night for sleep, can also do benadryl 25-50mg  at night for sleep.  If this does not help we can try prescription medication.  Also here is some information about good sleep hygiene.   Insomnia Insomnia is frequent trouble falling and/or staying asleep. Insomnia can be a long term problem or a short term problem. Both are common. Insomnia can be a short term problem when the wakefulness is related to a certain stress or worry. Long term insomnia is often related to ongoing stress during waking hours and/or poor sleeping habits. Overtime, sleep deprivation itself can make the problem worse. Every little thing feels more severe because you are overtired and your ability to cope is decreased. CAUSES   Stress, anxiety, and depression.  Poor sleeping habits.  Distractions such as TV in the bedroom.  Naps close to bedtime.  Engaging in emotionally charged conversations before bed.  Technical reading before sleep.  Alcohol and other sedatives. They may make the problem worse. They can hurt normal sleep patterns and normal dream activity.  Stimulants such as caffeine for several hours prior to bedtime.  Pain syndromes and shortness of breath can cause insomnia.  Exercise late at night.  Changing time zones may cause sleeping problems (jet lag). It is sometimes helpful to have someone observe your sleeping patterns. They should look for periods of not breathing during the night (sleep  apnea). They should also look to see how long those periods last. If you live alone or observers are uncertain, you can also be observed at a sleep clinic where your sleep patterns will be professionally monitored. Sleep apnea requires a checkup and treatment. Give your caregivers your medical history. Give your caregivers observations your family has made about your sleep.  SYMPTOMS   Not feeling rested in the morning.  Anxiety and restlessness at bedtime.  Difficulty falling and staying asleep. TREATMENT   Your caregiver may prescribe treatment for an underlying medical disorders. Your caregiver can give advice or help if you are using alcohol or other drugs for self-medication. Treatment of underlying problems will usually eliminate insomnia problems.  Medications can be prescribed for short time use. They are generally not recommended for lengthy use.  Over-the-counter sleep medicines are not recommended for lengthy use. They can be habit forming.  You can promote easier sleeping by making lifestyle changes such as:  Using relaxation techniques that help with breathing and reduce muscle tension.  Exercising earlier in the day.  Changing your diet and the time of your last meal. No night time snacks.  Establish a regular time to go to bed.  Counseling can help with stressful problems and worry.  Soothing music and white noise may be helpful if there are background noises you cannot remove.  Stop tedious detailed work at least one hour before bedtime. HOME CARE INSTRUCTIONS   Keep a diary. Inform your caregiver about your progress. This includes any medication side effects. See  your caregiver regularly. Take note of:  Times when you are asleep.  Times when you are awake during the night.  The quality of your sleep.  How you feel the next day. This information will help your caregiver care for you.  Get out of bed if you are still awake after 15 minutes. Read or do some  quiet activity. Keep the lights down. Wait until you feel sleepy and go back to bed.  Keep regular sleeping and waking hours. Avoid naps.  Exercise regularly.  Avoid distractions at bedtime. Distractions include watching television or engaging in any intense or detailed activity like attempting to balance the household checkbook.  Develop a bedtime ritual. Keep a familiar routine of bathing, brushing your teeth, climbing into bed at the same time each night, listening to soothing music. Routines increase the success of falling to sleep faster.  Use relaxation techniques. This can be using breathing and muscle tension release routines. It can also include visualizing peaceful scenes. You can also help control troubling or intruding thoughts by keeping your mind occupied with boring or repetitive thoughts like the old concept of counting sheep. You can make it more creative like imagining planting one beautiful flower after another in your backyard garden.  During your day, work to eliminate stress. When this is not possible use some of the previous suggestions to help reduce the anxiety that accompanies stressful situations. MAKE SURE YOU:   Understand these instructions.  Will watch your condition.  Will get help right away if you are not doing well or get worse. Document Released: 02/16/2000 Document Revised: 05/13/2011 Document Reviewed: 03/18/2007 [facility] Patient Information 2015 [place], [place]. This information is not intended to replace advice given to you by your health care provider. Make sure you discuss any questions you have with your health care provider.       When it comes to diets, agreement about the perfect plan isn't easy to find, even among the experts. Experts at the [employer] developed an idea known as the Healthy Eating Plate. Just imagine a plate divided into logical, healthy portions.  The emphasis is on diet quality:  Load up on  vegetables and fruits - one-half of your plate: Aim for color and variety, and remember that potatoes don't count.  Go for whole grains - one-quarter of your plate: Whole wheat, barley, wheat berries, quinoa, oats, brown rice, and foods made with them. If you want pasta, go with whole wheat pasta.  Protein power - one-quarter of your plate: Fish, chicken, beans, and nuts are all healthy, versatile protein sources. Limit red meat.  The diet, however, does go beyond the plate, offering a few other suggestions.  Use healthy plant oils, such as olive, canola, soy, corn, sunflower and peanut. Check the labels, and avoid partially hydrogenated oil, which have unhealthy trans fats.  If you're thirsty, drink water. Coffee and tea are good in moderation, but skip sugary drinks and limit milk and dairy products to one or two daily servings.  The type of carbohydrate in the diet is more important than the amount. Some sources of carbohydrates, such as vegetables, fruits, whole grains, and beans-are healthier than others.  Finally, stay active.

## 2017-08-29 LAB — TSH: TSH: 2.91 mIU/L (ref 0.40–4.50)

## 2017-08-29 LAB — CBC WITH DIFFERENTIAL/PLATELET
BASOS ABS: 50 {cells}/uL (ref 0–200)
Basophils Relative: 0.9 %
EOS PCT: 2.1 %
Eosinophils Absolute: 118 cells/uL (ref 15–500)
HCT: 38.9 % (ref 35.0–45.0)
Hemoglobin: 13 g/dL (ref 11.7–15.5)
Lymphs Abs: 1305 cells/uL (ref 850–3900)
MCH: 29.6 pg (ref 27.0–33.0)
MCHC: 33.4 g/dL (ref 32.0–36.0)
MCV: 88.6 fL (ref 80.0–100.0)
MONOS PCT: 7 %
MPV: 11.5 fL (ref 7.5–12.5)
NEUTROS ABS: 3735 {cells}/uL (ref 1500–7800)
NEUTROS PCT: 66.7 %
PLATELETS: 198 10*3/uL (ref 140–400)
RBC: 4.39 10*6/uL (ref 3.80–5.10)
RDW: 12.3 % (ref 11.0–15.0)
TOTAL LYMPHOCYTE: 23.3 %
WBC mixed population: 392 cells/uL (ref 200–950)
WBC: 5.6 10*3/uL (ref 3.8–10.8)

## 2017-08-29 LAB — VITAMIN D 25 HYDROXY (VIT D DEFICIENCY, FRACTURES): Vit D, 25-Hydroxy: 40 ng/mL (ref 30–100)

## 2017-08-29 LAB — LIPID PANEL
Cholesterol: 152 mg/dL (ref <200)
HDL: 37 mg/dL — ABNORMAL LOW (ref >50)
LDL Cholesterol (Calc): 94 mg/dL (calc)
Non-HDL Cholesterol (Calc): 115 mg/dL (calc) (ref <130)
Total CHOL/HDL Ratio: 4.1 (calc) (ref <5.0)
Triglycerides: 117 mg/dL (ref <150)

## 2017-08-29 LAB — COMPLETE METABOLIC PANEL WITH GFR
AG Ratio: 1.9 (calc) (ref 1.0–2.5)
ALKALINE PHOSPHATASE (APISO): 122 U/L (ref 33–130)
ALT: 7 U/L (ref 6–29)
AST: 18 U/L (ref 10–35)
Albumin: 4.6 g/dL (ref 3.6–5.1)
BUN: 16 mg/dL (ref 7–25)
CO2: 29 mmol/L (ref 20–32)
Calcium: 9.7 mg/dL (ref 8.6–10.4)
Chloride: 106 mmol/L (ref 98–110)
Creat: 0.96 mg/dL (ref 0.50–0.99)
GFR, Est African American: 72 mL/min/{1.73_m2} (ref >=60)
GFR, Est Non African American: 62 mL/min/{1.73_m2} (ref >=60)
GLOBULIN: 2.4 g/dL (ref 1.9–3.7)
GLUCOSE: 90 mg/dL (ref 65–99)
Potassium: 4.6 mmol/L (ref 3.5–5.3)
SODIUM: 143 mmol/L (ref 135–146)
Total Bilirubin: 0.6 mg/dL (ref 0.2–1.2)
Total Protein: 7 g/dL (ref 6.1–8.1)

## 2017-08-29 LAB — URINALYSIS W MICROSCOPIC + REFLEX CULTURE
Bacteria, UA: NONE SEEN /HPF
Bilirubin Urine: NEGATIVE
Glucose, UA: NEGATIVE
Hgb urine dipstick: NEGATIVE
Hyaline Cast: NONE SEEN /LPF
Leukocyte Esterase: NEGATIVE
Nitrites, Initial: NEGATIVE
RBC / HPF: NONE SEEN /HPF (ref 0–2)
Specific Gravity, Urine: 1.035 (ref 1.001–1.03)
WBC, UA: NONE SEEN /HPF (ref 0–5)
pH: 6 (ref 5.0–8.0)

## 2017-08-29 LAB — NO CULTURE INDICATED

## 2017-08-29 LAB — HEMOGLOBIN A1C
Hgb A1c MFr Bld: 5.4 % of total Hgb (ref <5.7)
Mean Plasma Glucose: 108 (calc)
eAG (mmol/L): 6 (calc)

## 2017-09-11 ENCOUNTER — Other Ambulatory Visit: Payer: Self-pay | Admitting: Internal Medicine

## 2017-09-11 MED ORDER — ALPRAZOLAM 1 MG PO TABS: ORAL_TABLET | ORAL | 0 refills | Status: DC

## 2017-09-15 ENCOUNTER — Other Ambulatory Visit: Payer: Self-pay | Admitting: Adult Health

## 2017-10-06 ENCOUNTER — Other Ambulatory Visit: Payer: Self-pay | Admitting: Internal Medicine

## 2017-10-23 ENCOUNTER — Other Ambulatory Visit: Payer: Self-pay | Admitting: Internal Medicine

## 2017-12-13 ENCOUNTER — Other Ambulatory Visit: Payer: Self-pay | Admitting: Internal Medicine

## 2017-12-16 DIAGNOSIS — R05 Cough: Secondary | ICD-10-CM | POA: Diagnosis not present

## 2017-12-17 ENCOUNTER — Ambulatory Visit: Payer: Self-pay | Admitting: Adult Health

## 2017-12-30 DIAGNOSIS — R69 Illness, unspecified: Secondary | ICD-10-CM | POA: Diagnosis not present

## 2018-01-06 ENCOUNTER — Other Ambulatory Visit: Payer: Self-pay | Admitting: Internal Medicine

## 2018-01-06 DIAGNOSIS — F411 Generalized anxiety disorder: Secondary | ICD-10-CM

## 2018-01-06 MED ORDER — ALPRAZOLAM 1 MG PO TABS: ORAL_TABLET | ORAL | 0 refills | Status: DC

## 2018-01-11 ENCOUNTER — Other Ambulatory Visit: Payer: Self-pay | Admitting: Adult Health

## 2018-02-17 ENCOUNTER — Encounter: Payer: Self-pay | Admitting: Physician Assistant

## 2018-02-26 ENCOUNTER — Encounter: Payer: Self-pay | Admitting: Physician Assistant

## 2018-02-26 DIAGNOSIS — J019 Acute sinusitis, unspecified: Secondary | ICD-10-CM | POA: Diagnosis not present

## 2018-02-26 DIAGNOSIS — R05 Cough: Secondary | ICD-10-CM | POA: Diagnosis not present

## 2018-03-06 ENCOUNTER — Encounter: Payer: Self-pay | Admitting: Adult Health

## 2018-03-06 DIAGNOSIS — K219 Gastro-esophageal reflux disease without esophagitis: Secondary | ICD-10-CM | POA: Insufficient documentation

## 2018-03-06 NOTE — Progress Notes (Signed)
Complete Physical  Assessment and Plan:   Encounter for general adult medical examination with abnormal findings 1 year  Allergic rhinitis due to pollen, unspecified seasonality Continue allergy medication  Irritable bowel syndrome, unspecified type If not on benefiber then add it, decrease stress,  if any worsening symptoms, blood in stool, AB pain, etc call office  Major depressive disorder with single episode, remission status unspecified -     buPROPion (WELLBUTRIN XL) 150 MG 24 hr tablet; Take 1 tablet (150 mg total) by mouth every morning.  Elevated blood pressure reading without diagnosis of hypertension - continue medications, DASH diet, exercise and monitor at home. Call if greater than 130/80.  -     CBC with Differential/Platelet -     CMP/GFR -     TSH -     Urinalysis, Routine w reflex microscopic -     Microalbumin / creatinine urine ratio -     EKG 12-Lead  Other abnormal glucose Discussed disease progression and risks Discussed diet/exercise, weight management and risk modification  Mixed hyperlipidemia -continue medications, check lipids, decrease fatty foods, increase activity.  -     Lipid panel  Vitamin D deficiency -     VITAMIN D 25 Hydroxy (Vit-D Deficiency, Fractures)  Anxiety state Monitor  Obesity Long discussion about weight loss, diet, and exercise Recommended diet heavy in fruits and veggies and low in animal meats, cheeses, and dairy products, appropriate calorie intake Discussed appropriate weight for height  Follow up at next visit  Medication management -     Magnesium   Discussed med's effects and SE's. Screening labs and tests as requested with regular follow-up as recommended. No future appointments.   HPI  68 y.o. female  presents for a complete physical. She has Anxiety state; Depression; IRRITABLE BOWEL SYNDROME; Allergic rhinitis; Vitamin D deficiency; Elevated blood pressure reading without diagnosis of hypertension;  Other abnormal glucose; Mixed hyperlipidemia; Obesity (BMI 30.0-34.9); and GERD (gastroesophageal reflux disease) on their problem list.   She is married, she has 1 child and has 2 grandkids, watches occasionally.  She is retired but still helps with polling in [employer]   She is a bit upset due to her 19 year old sister was diagnosed with inoperable brain tumors and is working through this.   Patient reports that she has been taking xanax mostly to help her sleep at nighttime. She takes most nights recently since her sister's diagnosis. She also takes benadryl 25 mg with this to help.   She is following with Dr. Harrington Challenger for GYN every 2 years, has appointment this upcoming year.   BMI is Body mass index is 33.03 kg/m., she has been working on diet and exercise, following some modified keto principles. She is modeling her house and also walks some. Has new puppy that she is walking. She drinks tea + splenda, water at night. She estimates 2-3 glasses.  Wt Readings from Last 3 Encounters:  03/09/18 206 lb 3.2 oz (93.5 kg)  08/28/17 211 lb 9.6 oz (96 kg)  04/02/17 210 lb (95.3 kg)   Her blood pressure has been controlled at home, today their BP is BP: 108/72.  She does not workout. She denies chest pain, shortness of breath, dizziness.   She is on cholesterol medication (atorvastatin 20 mg) and denies myalgias. Her cholesterol is not at goal. The cholesterol last visit was:  Lab Results  Component Value Date   CHOL 152 08/28/2017   HDL 37 (L) 08/28/2017  LDLCALC 94 08/28/2017   TRIG 117 08/28/2017   CHOLHDL 4.1 08/28/2017  . She hasn't been working on diet and exercise for prediabetes, she is on bASA, she is not on ACE/ARB and denies foot ulcerations, hyperglycemia, hypoglycemia , increased appetite, nausea, paresthesia of the feet, polydipsia, polyuria, visual disturbances, vomiting and weight loss. Last A1C in the office was:  Lab Results  Component Value Date   HGBA1C 5.4  08/28/2017   Patient is on Vitamin D supplement.   Lab Results  Component Value Date   VD25OH 40 08/28/2017     Lab Results  Component Value Date   GFRNONAA 62 08/28/2017        Current Medications:  Current Outpatient Medications on File Prior to Visit  Medication Sig Dispense Refill  . ALPRAZolam (XANAX) 1 MG tablet Take 1/2 to 1 tablet 2 x /day  ONLY if needed for Anxiety Attack and please try to limit to 5 days /week to avoid addiction 90 tablet 0  . aspirin 81 MG tablet Take 81 mg by mouth daily.     Marland Kitchen atorvastatin (LIPITOR) 20 MG tablet TAKE 1 TABLET BY MOUTH EVERY DAY 90 tablet 1  . CHOLECALCIFEROL PO Take 5,000 Units by mouth daily.    Marland Kitchen dicyclomine (BENTYL) 20 MG tablet Take 1 tablet (20 mg total) by mouth 2 (two) times daily. (Patient taking differently: Take 20 mg by mouth as needed. ) 120 tablet 2  . fluticasone (FLONASE) 50 MCG/ACT nasal spray PLACE 2 SPRAYS INTO EACH NOSTRIL ONCE DAILY 48 g 3  . montelukast (SINGULAIR) 10 MG tablet TAKE 1 TABLET BY MOUTH ONCE DAILY 90 tablet 1  . omeprazole (PRILOSEC) 40 MG capsule TAKE 1 CAPSULE BY MOUTH ONCE DAILY (Patient taking differently: as needed. ) 90 capsule 1   No current facility-administered medications on file prior to visit.     Health Maintenance:   Immunization History  Administered Date(s) Administered  . Tdap 08/27/2012   Tetanus: 2014 Flu vaccine: out in office  Pneumonia:  Prevnar 13: out in office Zostavax:   LMP: Hysterectomy Pap: Hysterectomy 2012, sees GYN every other year MGM: 2017 at GYN, will follow up this year DEXA: will get at GYN  Colonoscopy: 2011, due 2021 CT AB 08/01/2016 Stress test 2014  Last eye exam: remote, no issues, uses [employer] [facility] Last dental exam: Dr. ?, last visit 2019, has scheduled this week  Patient Care Team: Unk Pinto, MD as PCP - General (Internal Medicine) Rozetta Nunnery, MD as Consulting Physician (Otolaryngology) Nahser, Wonda Cheng, MD as  Consulting Physician (Cardiology) Inda Castle, MD (Inactive) as Consulting Physician (Gastroenterology) Gus Height, MD as Consulting Physician (Obstetrics and Gynecology)  Medical History:  Past Medical History:  Diagnosis Date  . Anxiety and depression   . Depression   . Diverticulitis   . DIVERTICULITIS 04/19/2009   Qualifier: Diagnosis of  By: Deatra Ina MD, Sandy Salaam   . Hyperlipidemia   . Hypertension   . Irritable bowel syndrome    Allergies Allergies  Allergen Reactions  . Levofloxacin   . Prednisone     SURGICAL HISTORY She  has a past surgical history that includes Tonsillectomy; Vaginal hysterectomy; Colon surgery (2012); Cholecystectomy (N/A, 12/17/2013); and Knee arthroscopy (Right, 2019). FAMILY HISTORY Her family history includes Brain cancer (age of onset: 46) in her sister; Cancer in her paternal aunt; Heart attack in her brother; Heart disease in her brother, father, and mother; Hypertension in her mother; Irritable bowel syndrome in  her sister; Osteoporosis in her mother. SOCIAL HISTORY She  reports that she has never smoked. She has never used smokeless tobacco. She reports that she does not drink alcohol or use drugs.  Review of Systems: Review of Systems  Constitutional: Negative for chills, fever and malaise/fatigue.  HENT: Negative for congestion, ear pain and sore throat.   Eyes: Negative.   Respiratory: Negative for cough, shortness of breath and wheezing.   Cardiovascular: Negative for chest pain, palpitations and leg swelling.  Gastrointestinal: Negative for abdominal pain, blood in stool, constipation, diarrhea, heartburn and melena.  Genitourinary: Negative.   Skin: Negative.   Neurological: Negative for dizziness, sensory change, loss of consciousness and headaches.  Psychiatric/Behavioral: Negative for depression. The patient is not nervous/anxious and does not have insomnia.     Physical Exam: Estimated body mass index is 33.03 kg/m as  calculated from the following:   Height as of this encounter: 5' 6.25" (1.683 m).   Weight as of this encounter: 206 lb 3.2 oz (93.5 kg). BP 108/72   Pulse 92   Temp (!) 97.3 F (36.3 C)   Ht 5' 6.25" (1.683 m)   Wt 206 lb 3.2 oz (93.5 kg)   SpO2 98%   BMI 33.03 kg/m   General Appearance: Well nourished well developed, in no apparent distress.  Eyes: PERRLA, EOMs, conjunctiva no swelling or erythema ENT/Mouth: Ear canals normal without obstruction, swelling, erythema, or discharge.  TMs normal bilaterally with no erythema, bulging, retraction, or loss of landmark.  Oropharynx moist and clear with no exudate, erythema, or swelling.   Neck: Supple, thyroid normal. No bruits.  No cervical adenopathy Respiratory: Respiratory effort normal, Breath sounds clear A&P without wheeze, rhonchi, rales.   Cardio: RRR without murmurs, rubs or gallops. Brisk peripheral pulses without edema.  Chest: symmetric, with normal excursions Breasts: Symmetric, without lumps, nipple discharge, retractions.  Abdomen: Soft, nontender, no guarding, rebound, hernias, masses, or organomegaly.  Lymphatics: Non tender without lymphadenopathy.  Musculoskeletal: Full ROM all peripheral extremities,5/5 strength, and normal gait.  Skin: Warm, dry without rashes, lesions, ecchymosis. Neuro: Awake and oriented X 3, Cranial nerves intact, reflexes equal bilaterally. Normal muscle tone, no cerebellar symptoms. Sensation intact.  Psych:  normal affect, Insight and Judgment appropriate.   EKG: WNL no ST changes, nonspecific t wave inversion unchanged V3-V5.   Over 40 minutes of exam, counseling, chart review and critical decision making was performed  Teresa Vasquez 2:46 PM [facility]

## 2018-03-09 ENCOUNTER — Encounter: Payer: Self-pay | Admitting: Adult Health

## 2018-03-09 ENCOUNTER — Ambulatory Visit (INDEPENDENT_AMBULATORY_CARE_PROVIDER_SITE_OTHER): Payer: Medicare HMO | Admitting: Adult Health

## 2018-03-09 VITALS — BP 108/72 | HR 92 | Temp 97.3°F | Ht 66.25 in | Wt 206.2 lb

## 2018-03-09 DIAGNOSIS — E559 Vitamin D deficiency, unspecified: Secondary | ICD-10-CM

## 2018-03-09 DIAGNOSIS — Z136 Encounter for screening for cardiovascular disorders: Secondary | ICD-10-CM | POA: Diagnosis not present

## 2018-03-09 DIAGNOSIS — F411 Generalized anxiety disorder: Secondary | ICD-10-CM

## 2018-03-09 DIAGNOSIS — K219 Gastro-esophageal reflux disease without esophagitis: Secondary | ICD-10-CM

## 2018-03-09 DIAGNOSIS — J301 Allergic rhinitis due to pollen: Secondary | ICD-10-CM

## 2018-03-09 DIAGNOSIS — R030 Elevated blood-pressure reading, without diagnosis of hypertension: Secondary | ICD-10-CM

## 2018-03-09 DIAGNOSIS — D649 Anemia, unspecified: Secondary | ICD-10-CM

## 2018-03-09 DIAGNOSIS — K589 Irritable bowel syndrome without diarrhea: Secondary | ICD-10-CM

## 2018-03-09 DIAGNOSIS — Z0000 Encounter for general adult medical examination without abnormal findings: Secondary | ICD-10-CM

## 2018-03-09 DIAGNOSIS — R7309 Other abnormal glucose: Secondary | ICD-10-CM

## 2018-03-09 DIAGNOSIS — E782 Mixed hyperlipidemia: Secondary | ICD-10-CM

## 2018-03-09 DIAGNOSIS — E669 Obesity, unspecified: Secondary | ICD-10-CM

## 2018-03-09 DIAGNOSIS — F329 Major depressive disorder, single episode, unspecified: Secondary | ICD-10-CM

## 2018-03-09 DIAGNOSIS — E66811 Obesity, class 1: Secondary | ICD-10-CM

## 2018-03-09 NOTE — Patient Instructions (Addendum)
Teresa Vasquez , Thank you for taking time to come for your Medicare Wellness Visit. I appreciate your ongoing commitment to your health goals. Please review the following plan we discussed and let me know if I can assist you in the future.   These are the goals we discussed: Goals    . DIET - INCREASE WATER INTAKE     65+ fluid ounces daily     . Exercise 150 min/wk Moderate Activity    . Weight (lb) < 200 lb (90.7 kg)       This is a list of the screening recommended for you and due dates:  Health Maintenance  Topic Date Due  . HIV Screening  [birth year]/1967  . Mammogram  12/02/2002  . Pap Smear  08/14/2013  . DEXA scan (bone density measurement)  12/01/2017  . Pneumonia vaccines (1 of 2 - PCV13) 12/01/2017  . Colon Cancer Screening  05/06/2019  . Tetanus Vaccine  08/28/2022  .  Hepatitis C: One time screening is recommended by Center for Disease Control  (CDC) for  adults born from [birth year] through 1963.   Completed  . Flu Shot  Discontinued    Please schedule an eye exam and a total body skin check this year- 1 small mole on back that may or may not need to come off     Know what a healthy weight is for you (roughly BMI <25) and aim to maintain this  Aim for 7+ servings of fruits and vegetables daily  65-80+ fluid ounces of water or unsweet tea for healthy kidneys  Limit to max 1 drink of alcohol per day; avoid smoking/tobacco  Limit animal fats in diet for cholesterol and heart health - choose grass fed whenever available  Avoid highly processed foods, and foods high in saturated/trans fats  Aim for low stress - take time to unwind and care for your mental health  Aim for 150 min of moderate intensity exercise weekly for heart health, and weights twice weekly for bone health  Aim for 7-9 hours of sleep daily      Cherry Angioma A cherry angioma is a harmless growth on the skin. It is made up of blood vessels. Cherry angiomas can appear anywhere on the body, but they  usually appear on the trunk and arms. What are the causes? The cause of this condition is not known, but it seems to be related to advancing age. What increases the risk? You are more likely to develop this condition if you:  Are over the age of 34.  Have a family member with this condition. What are the signs or symptoms?   Symptoms of this condition include harmless growths that are: ? Smooth, round, and red or purplish-red. ? As small as the tip of a pin or as big as a pencil eraser. How is this diagnosed? This condition is diagnosed with a skin exam. Rarely, a piece of the cherry angioma may be removed for testing if it is not clear that the growth is a cherry angioma. How is this treated? Treatment is not needed for this condition. If you do not like the way a cherry angioma looks, you may have it removed. Removal methods include:  A method where heat is used to burn the cherry angioma off the skin (electrocautery).  A method where the cherry angioma is frozen (cryosurgery). This causes it to eventually fall off the skin.  A method where a laser is used to destroy the red  blood cells and blood vessels in the angioma (laser therapy).  A minor surgical procedure. A scalpel is used to remove the cherry angioma off the skin. A cherry angioma may come back after it has been removed. Follow these instructions at home:  If you have a cherry angioma removed, keep the area clean and follow any other care instructions as told by your health care provider.  Take over-the-counter and prescription medicines only as told by your health care provider.  Keep all follow-up visits as told by your health care provider. This is important. Summary  A cherry angioma is a harmless growth on the skin that is made up of blood vessels.  Treatment is not needed for this condition.  If you do not like the way a cherry angioma looks, you may have it removed.  If you have a cherry angioma removed,  follow any care instructions as told by your health care provider. This information is not intended to replace advice given to you by your health care provider. Make sure you discuss any questions you have with your health care provider. Document Released: 04/29/2001 Document Revised: 09/09/2017 Document Reviewed: 09/09/2017 Elsevier Interactive Patient Education  2019 [employer].

## 2018-03-10 ENCOUNTER — Encounter: Payer: Self-pay | Admitting: Adult Health

## 2018-03-10 LAB — CBC WITH DIFFERENTIAL/PLATELET
Absolute Monocytes: 385 cells/uL (ref 200–950)
BASOS ABS: 30 {cells}/uL (ref 0–200)
Basophils Relative: 0.6 %
EOS PCT: 3.4 %
Eosinophils Absolute: 170 cells/uL (ref 15–500)
HCT: 37.6 % (ref 35.0–45.0)
HEMOGLOBIN: 12.3 g/dL (ref 11.7–15.5)
Lymphs Abs: 1160 cells/uL (ref 850–3900)
MCH: 29.1 pg (ref 27.0–33.0)
MCHC: 32.7 g/dL (ref 32.0–36.0)
MCV: 88.9 fL (ref 80.0–100.0)
MONOS PCT: 7.7 %
MPV: 11.2 fL (ref 7.5–12.5)
Neutro Abs: 3255 cells/uL (ref 1500–7800)
Neutrophils Relative %: 65.1 %
Platelets: 207 10*3/uL (ref 140–400)
RBC: 4.23 10*6/uL (ref 3.80–5.10)
RDW: 12.7 % (ref 11.0–15.0)
Total Lymphocyte: 23.2 %
WBC: 5 10*3/uL (ref 3.8–10.8)

## 2018-03-10 LAB — COMPLETE METABOLIC PANEL WITH GFR
AG RATIO: 2 (calc) (ref 1.0–2.5)
ALBUMIN MSPROF: 4.3 g/dL (ref 3.6–5.1)
ALKALINE PHOSPHATASE (APISO): 127 U/L (ref 33–130)
ALT: 7 U/L (ref 6–29)
AST: 16 U/L (ref 10–35)
BUN: 8 mg/dL (ref 7–25)
CO2: 31 mmol/L (ref 20–32)
Calcium: 9.6 mg/dL (ref 8.6–10.4)
Chloride: 106 mmol/L (ref 98–110)
Creat: 0.76 mg/dL (ref 0.50–0.99)
GFR, Est African American: 95 mL/min/{1.73_m2} (ref >=60)
GFR, Est Non African American: 82 mL/min/{1.73_m2} (ref >=60)
Globulin: 2.2 g/dL (calc) (ref 1.9–3.7)
Glucose, Bld: 89 mg/dL (ref 65–99)
POTASSIUM: 4.7 mmol/L (ref 3.5–5.3)
Sodium: 143 mmol/L (ref 135–146)
Total Bilirubin: 0.5 mg/dL (ref 0.2–1.2)
Total Protein: 6.5 g/dL (ref 6.1–8.1)

## 2018-03-10 LAB — IRON, TOTAL/TOTAL IRON BINDING CAP
%SAT: 23 % (calc) (ref 16–45)
Iron: 72 ug/dL (ref 45–160)
TIBC: 314 ug/dL (ref 250–450)

## 2018-03-10 LAB — URINALYSIS, ROUTINE W REFLEX MICROSCOPIC
Bacteria, UA: NONE SEEN /HPF
Bilirubin Urine: NEGATIVE
Glucose, UA: NEGATIVE
Hgb urine dipstick: NEGATIVE
Hyaline Cast: NONE SEEN /LPF
Ketones, ur: NEGATIVE
Nitrite: NEGATIVE
Protein, ur: NEGATIVE
RBC / HPF: NONE SEEN /HPF (ref 0–2)
Specific Gravity, Urine: 1.013 (ref 1.001–1.03)
WBC, UA: NONE SEEN /HPF (ref 0–5)
pH: 5.5 (ref 5.0–8.0)

## 2018-03-10 LAB — MICROALBUMIN / CREATININE URINE RATIO
Creatinine, Urine: 89 mg/dL (ref 20–275)
Microalb Creat Ratio: 7 mcg/mg creat (ref <30)
Microalb, Ur: 0.6 mg/dL

## 2018-03-10 LAB — VITAMIN B12: Vitamin B-12: 551 pg/mL (ref 200–1100)

## 2018-03-10 LAB — MAGNESIUM: MAGNESIUM: 2.1 mg/dL (ref 1.5–2.5)

## 2018-03-10 LAB — TSH: TSH: 3.68 mIU/L (ref 0.40–4.50)

## 2018-03-10 LAB — LIPID PANEL
CHOL/HDL RATIO: 5.3 (calc) — AB (ref <5.0)
CHOLESTEROL: 180 mg/dL (ref <200)
HDL: 34 mg/dL — ABNORMAL LOW (ref >50)
LDL CHOLESTEROL (CALC): 116 mg/dL — AB
Non-HDL Cholesterol (Calc): 146 mg/dL (calc) — ABNORMAL HIGH (ref <130)
Triglycerides: 180 mg/dL — ABNORMAL HIGH (ref <150)

## 2018-03-10 LAB — HEMOGLOBIN A1C
HEMOGLOBIN A1C: 5.8 %{Hb} — AB (ref <5.7)
Mean Plasma Glucose: 120 (calc)
eAG (mmol/L): 6.6 (calc)

## 2018-03-10 LAB — VITAMIN D 25 HYDROXY (VIT D DEFICIENCY, FRACTURES): Vit D, 25-Hydroxy: 37 ng/mL (ref 30–100)

## 2018-03-12 DIAGNOSIS — R69 Illness, unspecified: Secondary | ICD-10-CM | POA: Diagnosis not present

## 2018-03-13 ENCOUNTER — Telehealth: Payer: Self-pay

## 2018-03-13 NOTE — Telephone Encounter (Signed)
Patient states that she has called a couple of derm offices and the earliest they can get her in is in April. Wants to know if you have any suggestions that might help her get seen sooner?

## 2018-03-17 NOTE — Telephone Encounter (Signed)
Patient states that she is seeing dermatology for suspicious moles. Has gotten appointment for next week with [facility] dermatology. Gave patient our fax number to receive office note and lab results.

## 2018-03-25 DIAGNOSIS — D2239 Melanocytic nevi of other parts of face: Secondary | ICD-10-CM | POA: Diagnosis not present

## 2018-03-25 DIAGNOSIS — L814 Other melanin hyperpigmentation: Secondary | ICD-10-CM | POA: Diagnosis not present

## 2018-03-25 DIAGNOSIS — D225 Melanocytic nevi of trunk: Secondary | ICD-10-CM | POA: Diagnosis not present

## 2018-03-25 DIAGNOSIS — D485 Neoplasm of uncertain behavior of skin: Secondary | ICD-10-CM | POA: Diagnosis not present

## 2018-03-25 DIAGNOSIS — D1801 Hemangioma of skin and subcutaneous tissue: Secondary | ICD-10-CM | POA: Diagnosis not present

## 2018-04-01 ENCOUNTER — Other Ambulatory Visit: Payer: Self-pay | Admitting: Internal Medicine

## 2018-04-01 DIAGNOSIS — F411 Generalized anxiety disorder: Secondary | ICD-10-CM

## 2018-04-01 MED ORDER — ALPRAZOLAM 1 MG PO TABS: ORAL_TABLET | ORAL | 0 refills | Status: DC

## 2018-04-10 DIAGNOSIS — R69 Illness, unspecified: Secondary | ICD-10-CM | POA: Diagnosis not present

## 2018-05-13 DIAGNOSIS — R69 Illness, unspecified: Secondary | ICD-10-CM | POA: Diagnosis not present

## 2018-05-21 DIAGNOSIS — J069 Acute upper respiratory infection, unspecified: Secondary | ICD-10-CM | POA: Diagnosis not present

## 2018-05-28 ENCOUNTER — Other Ambulatory Visit: Payer: Self-pay | Admitting: Adult Health

## 2018-06-11 ENCOUNTER — Other Ambulatory Visit: Payer: Self-pay | Admitting: Internal Medicine

## 2018-06-11 DIAGNOSIS — F411 Generalized anxiety disorder: Secondary | ICD-10-CM

## 2018-06-11 MED ORDER — ALPRAZOLAM 1 MG PO TABS: ORAL_TABLET | ORAL | 0 refills | Status: DC

## 2018-06-15 ENCOUNTER — Other Ambulatory Visit: Payer: Self-pay

## 2018-06-15 ENCOUNTER — Encounter: Payer: Self-pay | Admitting: Podiatry

## 2018-06-15 ENCOUNTER — Ambulatory Visit (INDEPENDENT_AMBULATORY_CARE_PROVIDER_SITE_OTHER): Payer: Medicare HMO

## 2018-06-15 ENCOUNTER — Other Ambulatory Visit: Payer: Self-pay | Admitting: Podiatry

## 2018-06-15 ENCOUNTER — Ambulatory Visit (INDEPENDENT_AMBULATORY_CARE_PROVIDER_SITE_OTHER): Payer: Medicare HMO | Admitting: Podiatry

## 2018-06-15 VITALS — Temp 96.4°F

## 2018-06-15 DIAGNOSIS — G8929 Other chronic pain: Secondary | ICD-10-CM

## 2018-06-15 DIAGNOSIS — M722 Plantar fascial fibromatosis: Secondary | ICD-10-CM

## 2018-06-15 DIAGNOSIS — M7732 Calcaneal spur, left foot: Secondary | ICD-10-CM

## 2018-06-15 DIAGNOSIS — M79672 Pain in left foot: Principal | ICD-10-CM

## 2018-06-15 MED ORDER — MELOXICAM 15 MG PO TABS: 15.0000 mg | ORAL_TABLET | Freq: Every day | ORAL | 0 refills | Status: DC

## 2018-06-15 MED ORDER — TRIAMCINOLONE ACETONIDE 10 MG/ML IJ SUSP
10.0000 mg | Freq: Once | INTRAMUSCULAR | Status: AC
  Administered 2018-06-15: 10 mg

## 2018-06-15 NOTE — Patient Instructions (Signed)

## 2018-06-15 NOTE — Progress Notes (Deleted)
MEDICARE ANNUAL WELLNESS VISIT AND FOLLOW UP  Assessment:   Welcome to Medicare Visit  Allergic rhinitis due to pollen, unspecified seasonality Continue allergy medication  Irritable bowel syndrome, unspecified type If not on benefiber then add it, decrease stress,  if any worsening symptoms, blood in stool, AB pain, etc call office  GERD Well managed on current medications Discussed diet, avoiding triggers and other lifestyle changes  Major depressive disorder with single episode, remission status unspecified ***  Elevated blood pressure reading without diagnosis of hypertension - continue medications, DASH diet, exercise and monitor at home. Call if greater than 130/80.  -     CBC with Differential/Platelet -     CMP/GFR -     Magnesium   Other abnormal glucose Discussed disease progression and risks Discussed diet/exercise, weight management and risk modification  Mixed hyperlipidemia -continue medications, check lipids, decrease fatty foods, increase activity.  -     Lipid panel  Vitamin D deficiency Continue supplementation for goal of 60-100 -     VITAMIN D 25 Hydroxy (Vit-D Deficiency, Fractures)  Anxiety state/Insomnia ***  Obesity Long discussion about weight loss, diet, and exercise Recommended diet heavy in fruits and veggies and low in animal meats, cheeses, and dairy products, appropriate calorie intake Discussed appropriate weight for height  Follow up at next visit  Medication management -     Magnesium  Over 40 minutes of exam, counseling, chart review and critical decision making was performed Future Appointments  Date Time Provider [place]  06/16/2018  2:00 PM Liane Comber, NP [facility] None  07/13/2018 10:15 AM Trula Slade, DPM [facility]  09/23/2018  2:30 PM Unk Pinto, MD [facility] None  03/15/2019  2:00 PM Liane Comber, NP [facility] None     Plan:   During the course of the visit the patient was  educated and counseled about appropriate screening and preventive services including:    Pneumococcal vaccine   Prevnar 13  Influenza vaccine  Td vaccine  Screening electrocardiogram  Bone densitometry screening  Colorectal cancer screening  Diabetes screening  Glaucoma screening  Nutrition counseling   Advanced directives: requested   Subjective:  Teresa Vasquez is a 68 y.o. female who presents for Medicare Annual Wellness Visit and 3 month follow up.   She is married, she has 1 child and has 2 grandkids, watches occasionally.  She is retired but still helps with polling in [employer]   She is a bit upset due to her 61 year old sister was diagnosed with inoperable brain tumors and is working through this.   she has a diagnosis of anxiety and is currently on ***, reports symptoms are*** well controlled on current regimen. she *** Patient reports that she has been taking xanax mostly to help her sleep at nighttime. She takes most nights recently since her sister's diagnosis. She also takes benadryl 25 mg with this to help.  she has a diagnosis of GERD which is currently managed by protonix 40 mg *** she reports symptoms {ACTION; ARE/ARE AST:[id number]} currently well controlled, and denies breakthrough reflux, burning in chest, hoarseness or cough.    BMI is There is no height or weight on file to calculate BMI., she {HAS HAS NLG:92119} been working on diet and exercise. Wt Readings from Last 3 Encounters:  03/09/18 206 lb 3.2 oz (93.5 kg)  08/28/17 211 lb 9.6 oz (96 kg)  04/02/17 210 lb (95.3 kg)    Her blood pressure {HAS HAS NOT:18834} been controlled  at home, today their BP is   She {DOES_DOES DQQ:22979} workout. She denies chest pain, shortness of breath, dizziness.   She is on cholesterol medication (atorvastatin 20 mg daily ***) and denies myalgias. Her cholesterol is not at goal. The cholesterol last visit was:   Lab Results  Component Value Date    CHOL 180 03/09/2018   HDL 34 (L) 03/09/2018   LDLCALC 116 (H) 03/09/2018   TRIG 180 (H) 03/09/2018   CHOLHDL 5.3 (H) 03/09/2018    She {Has/has not:18111} been working on diet and exercise for prediabetes, and denies {Symptoms; diabetes w/o none:19199}. Last A1C in the office was:  Lab Results  Component Value Date   HGBA1C 5.8 (H) 03/09/2018   Last GFR: Lab Results  Component Value Date   [facility] 82 03/09/2018   Patient is on Vitamin D supplement.   Lab Results  Component Value Date   VD25OH 37 03/09/2018      Medication Review: Current Outpatient Medications on File Prior to Visit  Medication Sig Dispense Refill  . ALPRAZolam (XANAX) 1 MG tablet Take 1/2 to 1 tablet 2 x /day  ONLY if needed for Anxiety Attack and please try to limit to 5 days /week to avoid addiction 90 tablet 0  . aspirin 81 MG tablet Take 81 mg by mouth daily.     Marland Kitchen atorvastatin (LIPITOR) 20 MG tablet TAKE 1 TABLET BY MOUTH EVERY DAY 90 tablet 1  . CHOLECALCIFEROL PO Take 5,000 Units by mouth daily.    Marland Kitchen dicyclomine (BENTYL) 20 MG tablet Take 1 tablet (20 mg total) by mouth 2 (two) times daily. (Patient taking differently: Take 20 mg by mouth as needed. ) 120 tablet 2  . fluticasone (FLONASE) 50 MCG/ACT nasal spray PLACE 2 SPRAYS INTO EACH NOSTRIL ONCE DAILY 48 g 3  . meloxicam (MOBIC) 15 MG tablet Take 1 tablet (15 mg total) by mouth daily. 30 tablet 0  . montelukast (SINGULAIR) 10 MG tablet TAKE 1 TABLET BY MOUTH ONCE DAILY 90 tablet 1  . omeprazole (PRILOSEC) 40 MG capsule Take 1 capsule by mouth once daily 90 capsule 0   No current facility-administered medications on file prior to visit.     Allergies  Allergen Reactions  . Levofloxacin   . Prednisone     Current Problems (verified) Patient Active Problem List   Diagnosis Date Noted  . GERD (gastroesophageal reflux disease) 03/06/2018  . Obesity (BMI 30.0-34.9) 08/27/2017  . Allergic rhinitis 03/14/2013  . Vitamin D deficiency 03/14/2013   . Elevated blood pressure reading without diagnosis of hypertension 03/14/2013  . Other abnormal glucose (prediabetes) 03/14/2013  . Mixed hyperlipidemia 03/14/2013  . Anxiety state 04/13/2009  . Depression 04/13/2009  . IRRITABLE BOWEL SYNDROME 04/13/2009    Screening Tests Immunization History  Administered Date(s) Administered  . Influenza, High Dose Seasonal PF 04/10/2018  . Tdap 08/27/2012   Tetanus: 2014 Flu vaccine: 04/2018 Pneumonia:  Prevnar 13: n/a -  defer to next in office OV Zostavax:   LMP: Hysterectomy Pap: Hysterectomy 2012, sees GYN every other year MGM: 2017 at GYN, will follow up this year *** need reports DEXA: will get at GYN  Colonoscopy: 2011, due 2021  CT AB 08/01/2016 Stress test 2014  Last eye exam: remote, no issues, uses [employer] readers Last dental exam: Dr. ?, last visit 2019, has scheduled this week   Patient Care Team: Unk Pinto, MD as PCP - General (Internal Medicine) Rozetta Nunnery, MD as Consulting Physician (Otolaryngology)  Nahser, Wonda Cheng, MD as Consulting Physician (Cardiology) Inda Castle, MD (Inactive) as Consulting Physician (Gastroenterology) Gus Height, MD (Inactive) as Consulting Physician (Obstetrics and Gynecology)  SURGICAL HISTORY She  has a past surgical history that includes Tonsillectomy; Vaginal hysterectomy; Colon surgery (2012); Cholecystectomy (N/A, 12/17/2013); and Knee arthroscopy (Right, 2019). FAMILY HISTORY Her family history includes Brain cancer (age of onset: 87) in her sister; Cancer in her paternal aunt; Heart attack in her brother; Heart disease in her brother, father, and mother; Hypertension in her mother; Irritable bowel syndrome in her sister; Osteoporosis in her mother. SOCIAL HISTORY She  reports that she has never smoked. She has never used smokeless tobacco. She reports that she does not drink alcohol or use drugs.   MEDICARE WELLNESS OBJECTIVES: Physical activity:    Cardiac risk factors:   Depression/mood screen:   Depression screen [facility] 2/9 03/09/2018  Decreased Interest 0  Down, Depressed, Hopeless 1  PHQ - 2 Score 1    ADLs:  No flowsheet data found.   Cognitive Testing  Alert? Yes  Normal Appearance?Yes  Oriented to person? Yes  Place? Yes   Time? Yes  Recall of three objects?  Yes  Can perform simple calculations? Yes  Displays appropriate judgment?Yes  Can read the correct time from a watch face?Yes  EOL planning:    Review of Systems  Constitutional: Negative for malaise/fatigue and weight loss.  HENT: Negative for hearing loss and tinnitus.   Eyes: Negative for blurred vision and double vision.  Respiratory: Negative for cough, sputum production, shortness of breath and wheezing.   Cardiovascular: Negative for chest pain, palpitations, orthopnea, claudication, leg swelling and PND.  Gastrointestinal: Negative for abdominal pain, blood in stool, constipation, diarrhea, heartburn, melena, nausea and vomiting.  Genitourinary: Negative.   Musculoskeletal: Negative for falls, joint pain and myalgias.  Skin: Negative for rash.  Neurological: Negative for dizziness, tingling, sensory change, weakness and headaches.  Endo/Heme/Allergies: Negative for polydipsia.  Psychiatric/Behavioral: Negative.  Negative for depression, memory loss, substance abuse and suicidal ideas. The patient is not nervous/anxious and does not have insomnia.   All other systems reviewed and are negative.    Objective:     There were no vitals filed for this visit. There is no height or weight on file to calculate BMI.  General appearance: alert, no distress, WD/WN, female HEENT: normocephalic, sclerae anicteric, TMs pearly, nares patent, no discharge or erythema, pharynx normal Oral cavity: MMM, no lesions Neck: supple, no lymphadenopathy, no thyromegaly, no masses Heart: RRR, normal S1, S2, no murmurs Lungs: CTA bilaterally, no wheezes, rhonchi, or  rales Abdomen: +bs, soft, non tender, non distended, no masses, no hepatomegaly, no splenomegaly Musculoskeletal: nontender, no swelling, no obvious deformity Extremities: no edema, no cyanosis, no clubbing Pulses: 2+ symmetric, upper and lower extremities, normal cap refill Neurological: alert, oriented x 3, CN2-12 intact, strength normal upper extremities and lower extremities, sensation normal throughout, DTRs 2+ throughout, no cerebellar signs, gait normal Psychiatric: normal affect, behavior normal, pleasant   Medicare Attestation I have personally reviewed: The patient's medical and social history Their use of alcohol, tobacco or illicit drugs Their current medications and supplements The patient's functional ability including ADLs,fall risks, home safety risks, cognitive, and hearing and visual impairment Diet and physical activities Evidence for depression or mood disorders  The patient's weight, height, BMI, and visual acuity have been recorded in the chart.  I have made referrals, counseling, and provided education to the patient based on review of the above  and I have provided the patient with a written personalized care plan for preventive services.     [name], NP   06/15/2018

## 2018-06-16 ENCOUNTER — Encounter: Payer: Self-pay | Admitting: Adult Health

## 2018-06-16 ENCOUNTER — Other Ambulatory Visit: Payer: Self-pay

## 2018-06-16 ENCOUNTER — Ambulatory Visit: Payer: Medicare HMO | Admitting: Adult Health

## 2018-06-16 ENCOUNTER — Ambulatory Visit: Payer: Self-pay | Admitting: Adult Health

## 2018-06-16 VITALS — BP 104/62 | HR 69 | Temp 96.4°F | Wt 207.0 lb

## 2018-06-16 DIAGNOSIS — F411 Generalized anxiety disorder: Secondary | ICD-10-CM

## 2018-06-16 DIAGNOSIS — F329 Major depressive disorder, single episode, unspecified: Secondary | ICD-10-CM | POA: Diagnosis not present

## 2018-06-16 DIAGNOSIS — M722 Plantar fascial fibromatosis: Secondary | ICD-10-CM

## 2018-06-16 DIAGNOSIS — Z0000 Encounter for general adult medical examination without abnormal findings: Secondary | ICD-10-CM

## 2018-06-16 DIAGNOSIS — R6889 Other general symptoms and signs: Secondary | ICD-10-CM

## 2018-06-16 DIAGNOSIS — E669 Obesity, unspecified: Secondary | ICD-10-CM

## 2018-06-16 DIAGNOSIS — K589 Irritable bowel syndrome without diarrhea: Secondary | ICD-10-CM | POA: Diagnosis not present

## 2018-06-16 DIAGNOSIS — Z0001 Encounter for general adult medical examination with abnormal findings: Secondary | ICD-10-CM | POA: Diagnosis not present

## 2018-06-16 DIAGNOSIS — R030 Elevated blood-pressure reading, without diagnosis of hypertension: Secondary | ICD-10-CM | POA: Diagnosis not present

## 2018-06-16 DIAGNOSIS — E782 Mixed hyperlipidemia: Secondary | ICD-10-CM

## 2018-06-16 DIAGNOSIS — J301 Allergic rhinitis due to pollen: Secondary | ICD-10-CM

## 2018-06-16 DIAGNOSIS — K219 Gastro-esophageal reflux disease without esophagitis: Secondary | ICD-10-CM | POA: Diagnosis not present

## 2018-06-16 DIAGNOSIS — R7309 Other abnormal glucose: Secondary | ICD-10-CM | POA: Diagnosis not present

## 2018-06-16 DIAGNOSIS — E559 Vitamin D deficiency, unspecified: Secondary | ICD-10-CM

## 2018-06-16 DIAGNOSIS — R69 Illness, unspecified: Secondary | ICD-10-CM | POA: Diagnosis not present

## 2018-06-16 NOTE — Progress Notes (Signed)
Virtual Visit via Telephone Note  I connected with Teresa Vasquez on 99/37/16 at 12:00 PM EDT by telephone and verified that I am speaking with the correct person using two identifiers.   I discussed the limitations, risks, security and privacy concerns of performing an evaluation and management service by telephone and the availability of in person appointments. I also discussed with the patient that there may be a patient responsible charge related to this service. The patient expressed understanding and agreed to proceed.   I discussed the assessment and treatment plan with the patient. The patient was provided an opportunity to ask questions and all were answered. The patient agreed with the plan and demonstrated an understanding of the instructions.   The patient was advised to call back or seek an in-person evaluation if the symptoms worsen or if the condition fails to improve as anticipated.  I provided 30 minutes of non-face-to-face time during this encounter.   Teresa Ribas, NP    MEDICARE ANNUAL WELLNESS VISIT AND FOLLOW UP  Assessment:   Welcome to medication visit 1 year; follow up GYN for mammograms/PAP/dexa prevnar 13 at next visit Call insurance about shingles vaccine   Allergic rhinitis due to pollen, unspecified seasonality Continue allergy medication  Irritable bowel syndrome, unspecified type If not on benefiber then add it, decrease stress,  if any worsening symptoms, blood in stool, AB pain, etc call office  Major depressive disorder with single episode, remission status unspecified Declines daily medication; she feels well managed with lifestyle changes Lifestyle discussed: diet/exerise, sleep hygiene, stress management, hydration  Elevated blood pressure reading without diagnosis of hypertension - continue medications, DASH diet, exercise and monitor at home. Call if greater than 130/80.  -     CBC with Differential/Platelet -      CMP/GFR  Other abnormal glucose Discussed disease progression and risks Discussed diet/exercise, weight management and risk modification  Mixed hyperlipidemia -continue medications, check lipids, decrease fatty foods, increase activity.  -     Lipid panel  Vitamin D deficiency -     VITAMIN D 25 Hydroxy (Vit-D Deficiency, Fractures)  Anxiety state Monitor  Obesity Long discussion about weight loss, diet, and exercise Recommended diet heavy in fruits and veggies and low in animal meats, cheeses, and dairy products, appropriate calorie intake Discussed appropriate weight for height  Follow up at next visit  Medication management -     Magnesium  Left plantar fascitis Managed by foot center; continue brace, exercises, antiinflammatories  Over 40 minutes of exam, counseling, chart review and critical decision making was performed Future Appointments  Date Time Provider [place]  07/13/2018 10:15 AM Trula Slade, DPM [facility]  09/23/2018  2:30 PM Unk Pinto, MD [facility] None  03/15/2019  2:00 PM Liane Comber, NP [facility] None     Plan:   During the course of the visit the patient was educated and counseled about appropriate screening and preventive services including:    Pneumococcal vaccine   Prevnar 13  Influenza vaccine  Td vaccine  Screening electrocardiogram  Bone densitometry screening  Colorectal cancer screening  Diabetes screening  Glaucoma screening  Nutrition counseling   Advanced directives: requested   Subjective:  Teresa Vasquez is a 68 y.o. female who presents for Medicare Annual Wellness Visit and 3 month follow up.   She has plantar fascitis ongoing since Jan 2020, followed by [facility] and recently started on mobic and had steroid shot yesterday. She was also given  a brace to wear.   She has hx of depression/anxiety but feels she is doing well off of medications and declines daily agent,  feels she does well as long as she sleeps well she manages; she does have a 33 year old mother, younger sister newly diagnosed with brain tumor. She is currently prescribed xanax, uses mainly in the evening for sleep PRN.   BMI is Body mass index is 33.16 kg/m., she has been working on diet but exercise has been limited due to ongoing foot pain.  Wt Readings from Last 3 Encounters:  06/16/18 207 lb (93.9 kg)  03/09/18 206 lb 3.2 oz (93.5 kg)  08/28/17 211 lb 9.6 oz (96 kg)    Her blood pressure has been controlled at home, today their BP is BP: 104/62 She does not workout. She denies chest pain, shortness of breath, dizziness.   She is on cholesterol medication (atorvastatin 20 mg daily) and denies myalgias. Her cholesterol is not at goal. The cholesterol last visit was:   Lab Results  Component Value Date   CHOL 180 03/09/2018   HDL 34 (L) 03/09/2018   LDLCALC 116 (H) 03/09/2018   TRIG 180 (H) 03/09/2018   CHOLHDL 5.3 (H) 03/09/2018    She has been working on diet for prediabetes, and denies increased appetite, nausea, paresthesia of the feet, polydipsia, polyuria and visual disturbances. Last A1C in the office was:  Lab Results  Component Value Date   HGBA1C 5.8 (H) 03/09/2018   Last GFR: Lab Results  Component Value Date   GFRNONAA 82 03/09/2018   Patient is on Vitamin D supplement, has increased to 5000 IU daily;   Lab Results  Component Value Date   VD25OH 37 03/09/2018       Medication Review: Current Outpatient Medications on File Prior to Visit  Medication Sig Dispense Refill  . ALPRAZolam (XANAX) 1 MG tablet Take 1/2 to 1 tablet 2 x /day  ONLY if needed for Anxiety Attack and please try to limit to 5 days /week to avoid addiction 90 tablet 0  . aspirin 81 MG tablet Take 81 mg by mouth daily.     Marland Kitchen atorvastatin (LIPITOR) 20 MG tablet TAKE 1 TABLET BY MOUTH EVERY DAY 90 tablet 1  . CHOLECALCIFEROL PO Take 5,000 Units by mouth daily.    Marland Kitchen dicyclomine (BENTYL) 20 MG  tablet Take 1 tablet (20 mg total) by mouth 2 (two) times daily. (Patient taking differently: Take 20 mg by mouth as needed. ) 120 tablet 2  . fluticasone (FLONASE) 50 MCG/ACT nasal spray PLACE 2 SPRAYS INTO EACH NOSTRIL ONCE DAILY 48 g 3  . meloxicam (MOBIC) 15 MG tablet Take 1 tablet (15 mg total) by mouth daily. 30 tablet 0  . montelukast (SINGULAIR) 10 MG tablet TAKE 1 TABLET BY MOUTH ONCE DAILY 90 tablet 1  . omeprazole (PRILOSEC) 40 MG capsule Take 1 capsule by mouth once daily 90 capsule 0   No current facility-administered medications on file prior to visit.     Allergies  Allergen Reactions  . Levofloxacin   . Prednisone     Current Problems (verified) Patient Active Problem List   Diagnosis Date Noted  . Plantar fasciitis of left foot 06/16/2018  . GERD (gastroesophageal reflux disease) 03/06/2018  . Obesity (BMI 30.0-34.9) 08/27/2017  . Allergic rhinitis 03/14/2013  . Vitamin D deficiency 03/14/2013  . Elevated blood pressure reading without diagnosis of hypertension 03/14/2013  . Other abnormal glucose (prediabetes) 03/14/2013  . Mixed  hyperlipidemia 03/14/2013  . Anxiety state 04/13/2009  . Depression 04/13/2009  . IRRITABLE BOWEL SYNDROME 04/13/2009    Screening Tests Immunization History  Administered Date(s) Administered  . Influenza, High Dose Seasonal PF 04/10/2018  . Tdap 08/27/2012    Tetanus: 2014 Flu vaccine: 04/2018 Pneumonia:  Prevnar 13: out in office Zostavax: defer   LMP: Hysterectomy Pap: Hysterectomy 2012, sees GYN every other year, last PAP 2017 MGM: 2017 at GYN, will follow up this year DEXA: will get at GYN  Colonoscopy: 2011, due 2021 CT AB 08/01/2016 Stress test 2014  Last eye exam: remote, no issues, uses [employer] readers Last dental exam: Dr.Finn, last visit 2020  Patient Care Team: Unk Pinto, MD as PCP - General (Internal Medicine) Rozetta Nunnery, MD as Consulting Physician (Otolaryngology) Nahser, Wonda Cheng, MD as Consulting Physician (Cardiology) Inda Castle, MD (Inactive) as Consulting Physician (Gastroenterology) Gus Height, MD (Inactive) as Consulting Physician (Obstetrics and Gynecology)  SURGICAL HISTORY She  has a past surgical history that includes Tonsillectomy; Vaginal hysterectomy; Colon surgery (2012); Cholecystectomy (N/A, 12/17/2013); and Knee arthroscopy (Right, 2019). FAMILY HISTORY Her family history includes Brain cancer (age of onset: 26) in her sister; Cancer in her paternal aunt; Heart attack (age of onset: 53) in her brother; Heart attack (age of onset: 17) in her father; Heart disease in her brother, father, and mother; Hypertension in her mother; Irritable bowel syndrome in her sister; Osteoporosis in her mother. SOCIAL HISTORY She  reports that she has never smoked. She has never used smokeless tobacco. She reports that she does not drink alcohol or use drugs.   MEDICARE WELLNESS OBJECTIVES: Physical activity: Current Exercise Habits: The patient does not participate in regular exercise at present, Exercise limited by: orthopedic condition(s) Cardiac risk factors: Cardiac Risk Factors include: obesity (BMI >30kg/m2);sedentary lifestyle;advanced age (>39men, >70 women);dyslipidemia;family history of premature cardiovascular disease Depression/mood screen:   Depression screen [facility] 2/9 06/16/2018  Decreased Interest 0  Down, Depressed, Hopeless 1  PHQ - 2 Score 1    ADLs:  In your present state of health, do you have any difficulty performing the following activities: 06/16/2018  Hearing? N  Vision? N  Difficulty concentrating or making decisions? N  Walking or climbing stairs? N  Dressing or bathing? N  Doing errands, shopping? N  Some recent data might be hidden     Cognitive Testing  Alert? Yes  Normal Appearance?Yes  Oriented to person? Yes  Place? Yes   Time? Yes  Recall of three objects?  Yes  Can perform simple calculations? Yes  Displays  appropriate judgment?Yes  Can read the correct time from a watch face?Yes  EOL planning: Does Patient Have a Medical Advance Directive?: Yes Type of Advance Directive: Living will Does patient want to make changes to medical advance directive?: No - Patient declined  Review of Systems  Constitutional: Negative for malaise/fatigue and weight loss.  HENT: Negative for hearing loss and tinnitus.   Eyes: Negative for blurred vision and double vision.  Respiratory: Negative for cough, sputum production, shortness of breath and wheezing.   Cardiovascular: Negative for chest pain, palpitations, orthopnea, claudication, leg swelling and PND.  Gastrointestinal: Negative for abdominal pain, blood in stool, constipation, diarrhea, heartburn, melena, nausea and vomiting.  Genitourinary: Negative.   Musculoskeletal: Negative for falls, joint pain and myalgias.       Left foot pain; seeing podiatry   Skin: Negative for rash.  Neurological: Negative for dizziness, tingling, sensory change, weakness and headaches.  Endo/Heme/Allergies: Negative for polydipsia.  Psychiatric/Behavioral: Negative.  Negative for depression, memory loss, substance abuse and suicidal ideas. The patient is not nervous/anxious and does not have insomnia.   All other systems reviewed and are negative.    Objective:     Today's Vitals   06/16/18 1142  BP: 104/62  Pulse: 69  Temp: (!) 96.4 F (35.8 C)  Weight: 207 lb (93.9 kg)   Body mass index is 33.16 kg/m.  General : Well sounding patient in no apparent distress HEENT: no hoarseness, no cough for duration of visit Lungs: speaks in complete sentences, no audible wheezing, no apparent distress Neurological: alert, oriented x 3 Psychiatric: pleasant, judgement appropriate    Medicare Attestation I have personally reviewed: The patient's medical and social history Their use of alcohol, tobacco or illicit drugs Their current medications and supplements The  patient's functional ability including ADLs,fall risks, home safety risks, cognitive, and hearing and visual impairment Diet and physical activities Evidence for depression or mood disorders  The patient's weight, height, BMI, and visual acuity have been recorded in the chart.  I have made referrals, counseling, and provided education to the patient based on review of the above and I have provided the patient with a written personalized care plan for preventive services.     Teresa Ribas, NP   06/16/2018

## 2018-06-17 NOTE — Progress Notes (Signed)
Subjective:   Patient ID: Teresa Vasquez, female   DOB: 68 y.o.   MRN: [id number]   HPI 68 year old female presents the office today for concerns of pain to the left heel on the plantar aspect.  This been ongoing since December 2019.  She states it hurts more if she sits for some time she stands back up.  It started after she was on a ladder at home.  She states it goes into the arch as well and she feels a pulling sensation.  She describes a sore, tender discomfort.  Occasional tingling.  No sharp or radiating pain.  The pain does not wake her up at night.  She has no other concerns today.  Review of Systems  All other systems reviewed and are negative.  Past Medical History:  Diagnosis Date  . Anxiety and depression   . Depression   . Diverticulitis   . DIVERTICULITIS 04/19/2009   Qualifier: Diagnosis of  By: Deatra Ina MD, Sandy Salaam   . Hyperlipidemia   . Hypertension   . Irritable bowel syndrome     Past Surgical History:  Procedure Laterality Date  . CHOLECYSTECTOMY N/A 12/17/2013   Procedure: LAPAROSCOPIC CHOLECYSTECTOMY WITH INTRAOPERATIVE CHOLANGIOGRAM;  Surgeon: Fanny Skates, MD;  Location: [facility];  Service: General;  Laterality: N/A;  . COLON SURGERY  2012   Diverticulosis  . KNEE ARTHROSCOPY Right 2019   x 2  . TONSILLECTOMY    . VAGINAL HYSTERECTOMY       Current Outpatient Medications:  .  ALPRAZolam (XANAX) 1 MG tablet, Take 1/2 to 1 tablet 2 x /day  ONLY if needed for Anxiety Attack and please try to limit to 5 days /week to avoid addiction, Disp: 90 tablet, Rfl: 0 .  aspirin 81 MG tablet, Take 81 mg by mouth daily. , Disp: , Rfl:  .  atorvastatin (LIPITOR) 20 MG tablet, TAKE 1 TABLET BY MOUTH EVERY DAY, Disp: 90 tablet, Rfl: 1 .  CHOLECALCIFEROL PO, Take 5,000 Units by mouth daily., Disp: , Rfl:  .  dicyclomine (BENTYL) 20 MG tablet, Take 1 tablet (20 mg total) by mouth 2 (two) times daily. (Patient taking differently: Take 20 mg by mouth as needed. ), Disp: 120  tablet, Rfl: 2 .  fluticasone (FLONASE) 50 MCG/ACT nasal spray, PLACE 2 SPRAYS INTO EACH NOSTRIL ONCE DAILY, Disp: 48 g, Rfl: 3 .  meloxicam (MOBIC) 15 MG tablet, Take 1 tablet (15 mg total) by mouth daily., Disp: 30 tablet, Rfl: 0 .  montelukast (SINGULAIR) 10 MG tablet, TAKE 1 TABLET BY MOUTH ONCE DAILY, Disp: 90 tablet, Rfl: 1 .  omeprazole (PRILOSEC) 40 MG capsule, Take 1 capsule by mouth once daily, Disp: 90 capsule, Rfl: 0  Allergies  Allergen Reactions  . Levofloxacin   . Prednisone           Objective:  Physical Exam  General: AAO x3, NAD  Dermatological: Skin is warm, dry and supple bilateral. Nails x 10 are well manicured; remaining integument appears unremarkable at this time. There are no open sores, no preulcerative lesions, no rash or signs of infection present.  Vascular: Dorsalis Pedis artery and Posterior Tibial artery pedal pulses are 2/4 bilateral with immedate capillary fill time. Pedal hair growth present. No varicosities and no lower extremity edema present bilateral. There is no pain with calf compression, swelling, warmth, erythema.   Neruologic: Grossly intact via light touch bilateral.  Protective threshold with Semmes Wienstein monofilament intact to all pedal sites bilateral.  Negative  Tinel sign.  Musculoskeletal: Tenderness to palpation along the plantar medial tubercle of the calcaneus at the insertion of plantar fascia on the left  foot. There is mild discomfort along the course of the plantar fascia within the arch of the foot. Plantar fascia appears to be intact. There is no pain with lateral compression of the calcaneus or pain with vibratory sensation. There is no pain along the course or insertion of the achilles tendon. No other areas of tenderness to bilateral lower extremities.Muscular strength 5/5 in all groups tested bilateral.  Gait: Unassisted, Nonantalgic.       Assessment:   68 year old female left heel pain, plantar fasciitis  Plan:   -Treatment options discussed including all alternatives, risks, and complications -Etiology of symptoms were discussed -X-rays were obtained and reviewed with the patient. No evidence of acute fracture or stress fracture identified today. Heel spur present -Steroid injection performed today.  See procedure note below. -Plantar fascial brace dispensed. -Prescribed mobic. Discussed side effects of the medication and directed to stop if any are to occur and call the office.  -Stretching, icing exercises daily. -Shoe modifications and orthotics  Procedure: Injection Tendon/Ligament Discussed alternatives, risks, complications and verbal consent was obtained.  Location: LEFT plantar fascia at the glabrous junction; medial approach. Skin Prep: Alcohol  Injectate: 0.5cc 0.5% marcaine plain, 0.5 cc 2% lidocaine plain and, 1 cc kenalog 10. Disposition: Patient tolerated procedure well. Injection site dressed with a band-aid.  Post-injection care was discussed and return precautions discussed.   Return in about 4 weeks (around 07/13/2018).  Trula Slade DPM

## 2018-07-05 ENCOUNTER — Other Ambulatory Visit: Payer: Self-pay | Admitting: Adult Health

## 2018-07-05 ENCOUNTER — Other Ambulatory Visit: Payer: Self-pay | Admitting: Podiatry

## 2018-07-13 ENCOUNTER — Ambulatory Visit (INDEPENDENT_AMBULATORY_CARE_PROVIDER_SITE_OTHER): Payer: Medicare HMO | Admitting: Podiatry

## 2018-07-13 ENCOUNTER — Other Ambulatory Visit: Payer: Self-pay

## 2018-07-13 DIAGNOSIS — M722 Plantar fascial fibromatosis: Secondary | ICD-10-CM

## 2018-07-13 MED ORDER — IBUPROFEN 800 MG PO TABS: 800.0000 mg | ORAL_TABLET | Freq: Three times a day (TID) | ORAL | 0 refills | Status: DC | PRN

## 2018-07-13 MED ORDER — TRIAMCINOLONE ACETONIDE 10 MG/ML IJ SUSP
10.0000 mg | Freq: Once | INTRAMUSCULAR | Status: AC
  Administered 2018-07-13: 10 mg

## 2018-07-15 NOTE — Progress Notes (Signed)
Subjective: 68 year old female presents the office today for follow-up evaluation left heel pain, plantar fasciitis.  She states the injection did help quite a bit for 2 weeks but started to flare back up.  She states the brace is also helping some.  She denies any recent changes or injury since I last saw her. Denies any systemic complaints such as fevers, chills, nausea, vomiting. No acute changes since last appointment, and no other complaints at this time.   Objective: AAO x3, NAD DP/PT pulses palpable bilaterally, CRT less than 3 seconds There is continuation of the decreased tenderness palpation of the plantar medial tubercle of the calcaneus at the insertion of plantar fascia on the left side.  Plantar fascial appears to be intact.  No pain with compression of calcaneus.  Negative Tinel sign. No open lesions or pre-ulcerative lesions.  No pain with calf compression, swelling, warmth, erythema  Assessment: Left heel pain, plantar fasciitis  Plan: -All treatment options discussed with the patient including all alternatives, risks, complications.  -We will switch to ibuprofen which was prescribed today. -Plantar fascial strapping applied. -A second steroid injection was performed.  See procedure note below. -Discussed shoe modifications and orthotics.  Continue with stretching, icing daily. -Patient encouraged to call the office with any questions, concerns, change in symptoms.   Procedure: Injection Tendon/Ligament Discussed alternatives, risks, complications and verbal consent was obtained.  Location:LEFT lantar fascia at the glabrous junction; medial approach. Skin Prep: Alcohol  Injectate: 0.5cc 0.5% marcaine plain, 0.5 cc 2% lidocaine plain and, 1 cc kenalog 10. Disposition: Patient tolerated procedure well. Injection site dressed with a band-aid.  Post-injection care was discussed and return precautions discussed.   Trula Slade DPM

## 2018-07-22 DIAGNOSIS — R69 Illness, unspecified: Secondary | ICD-10-CM | POA: Diagnosis not present

## 2018-08-20 ENCOUNTER — Other Ambulatory Visit: Payer: Self-pay | Admitting: Internal Medicine

## 2018-08-20 ENCOUNTER — Other Ambulatory Visit: Payer: Self-pay | Admitting: Adult Health

## 2018-08-20 DIAGNOSIS — F411 Generalized anxiety disorder: Secondary | ICD-10-CM

## 2018-08-20 MED ORDER — ALPRAZOLAM 1 MG PO TABS: ORAL_TABLET | ORAL | 0 refills | Status: DC

## 2018-08-25 ENCOUNTER — Other Ambulatory Visit: Payer: Self-pay

## 2018-08-25 ENCOUNTER — Ambulatory Visit (INDEPENDENT_AMBULATORY_CARE_PROVIDER_SITE_OTHER): Payer: Medicare HMO | Admitting: Podiatry

## 2018-08-25 ENCOUNTER — Encounter: Payer: Self-pay | Admitting: Podiatry

## 2018-08-25 VITALS — Temp 97.2°F

## 2018-08-25 DIAGNOSIS — M722 Plantar fascial fibromatosis: Secondary | ICD-10-CM | POA: Diagnosis not present

## 2018-08-25 MED ORDER — TRIAMCINOLONE ACETONIDE 10 MG/ML IJ SUSP
10.0000 mg | Freq: Once | INTRAMUSCULAR | Status: AC
  Administered 2018-08-25: 10 mg

## 2018-08-25 NOTE — Patient Instructions (Signed)

## 2018-08-26 NOTE — Progress Notes (Signed)
Subjective: 68 year old female presents the office today for follow-up evaluation of left heel pain.  She states that she still having pain but she does note the pain is moved.  The second injection was not as helpful as the first.  She states that she has more pain to the outside she points more to the lateral as well as the back of the heel nail.  She states that she is getting pain in the morning when she first gets up.  Denies any systemic complaints such as fevers, chills, nausea, vomiting. No acute changes since last appointment, and no other complaints at this time.   Objective: AAO x3, NAD DP/PT pulses palpable bilaterally, CRT less than 3 seconds The majority tenderness is mostly to the posterior aspect of the heel.  Also slight discomfort at the plantar lateral aspect of the heel.  No significant discomfort on plantar medial aspect.  No pain with lateral compression of calcaneus.  There is no edema, erythema. No open lesions or pre-ulcerative lesions.  No pain with calf compression, swelling, warmth, erythema  Assessment: Achilles tendinitis, plantar fasciitis  Plan: -All treatment options discussed with the patient including all alternatives, risks, complications.  -Steroid injection performed today.  See procedure note below. Dispensed.  Discussed traction, ice daily. -Patient encouraged to call the office with any questions, concerns, change in symptoms.   Procedure: Injection Tendon/Ligament Discussed alternatives, risks, complications and verbal consent was obtained.  Location:LEFT plantar fascia at the glabrous junction; lateral approach. Skin Prep: Alcohol  Injectate: 0.5cc 0.5% marcaine plain, 0.5 cc 2% lidocaine plain and, 1 cc kenalog 10. Disposition: Patient tolerated procedure well. Injection site dressed with a band-aid.  Post-injection care was discussed and return precautions discussed.   Trula Slade DPM

## 2018-09-18 ENCOUNTER — Other Ambulatory Visit: Payer: Self-pay

## 2018-09-18 ENCOUNTER — Telehealth: Payer: Self-pay | Admitting: Podiatry

## 2018-09-18 ENCOUNTER — Ambulatory Visit (HOSPITAL_COMMUNITY)
Admission: RE | Admit: 2018-09-18 | Discharge: 2018-09-18 | Disposition: A | Discharge status: Home / Self Care | Payer: Medicare HMO | Source: Ambulatory Visit | Attending: Family | Admitting: Family

## 2018-09-18 DIAGNOSIS — R1032 Left lower quadrant pain: Secondary | ICD-10-CM | POA: Insufficient documentation

## 2018-09-18 DIAGNOSIS — R1031 Right lower quadrant pain: Secondary | ICD-10-CM | POA: Insufficient documentation

## 2018-09-18 DIAGNOSIS — M79605 Pain in left leg: Secondary | ICD-10-CM

## 2018-09-18 DIAGNOSIS — M79604 Pain in right leg: Secondary | ICD-10-CM

## 2018-09-18 NOTE — Telephone Encounter (Signed)
Called and received note of negative exam

## 2018-09-18 NOTE — Telephone Encounter (Signed)
Patient is having some leg cramps and wanted to know could that be associated with her plantars fasc. Patient has an appointment on tuesday

## 2018-09-18 NOTE — Telephone Encounter (Signed)
Please let me know if you hear. She should also come in to be seen as well. Since she is getting the ultrasound today she can come in Monday if she is able.

## 2018-09-18 NOTE — Addendum Note (Signed)
Addended by: Harriett Sine D on: 09/18/2018 11:40 AM   Modules accepted: Orders

## 2018-09-18 NOTE — Telephone Encounter (Signed)
I informed pt's husband, Marc Morgans of today's appt to at [facility] at 3:00pm arrive 2:45pm. Faxed the required form to [facility].

## 2018-09-18 NOTE — Telephone Encounter (Addendum)
Dr. Jacqualyn Posey states venous dopplers today. [facility] - [facility] transferred to the doppler lab, unable to schedule with vascular lab scheduler Leroy Kennedy is out of the office. I called the [phone] instructed to call to schedule and Davy Pique states she will see how to schedule. Juliann Pulse states pt is scheduled today at 3:00pm needs to arrive 2:45pm and to wear a mask.

## 2018-09-18 NOTE — Telephone Encounter (Addendum)
I called pt and she states since February she has had cramping all the way up to the legs from the feet, squeezing and knot, worse at night, and has had shortness of breath, worse this week.

## 2018-09-21 ENCOUNTER — Telehealth: Payer: Self-pay | Admitting: *Deleted

## 2018-09-21 NOTE — Telephone Encounter (Signed)
Called and spoke with the patient and relayed the message per Dr Jacqualyn Posey and stated that the ultrasound was negative and the patient is coming in tomorrow to see Dr Jacqualyn Posey. Teresa Vasquez

## 2018-09-22 ENCOUNTER — Ambulatory Visit (INDEPENDENT_AMBULATORY_CARE_PROVIDER_SITE_OTHER): Payer: Medicare HMO | Admitting: Podiatry

## 2018-09-22 ENCOUNTER — Other Ambulatory Visit: Payer: Self-pay

## 2018-09-22 ENCOUNTER — Encounter: Payer: Self-pay | Admitting: Podiatry

## 2018-09-22 VITALS — BP 113/73 | HR 104 | Temp 97.4°F | Resp 16

## 2018-09-22 DIAGNOSIS — M79672 Pain in left foot: Secondary | ICD-10-CM | POA: Diagnosis not present

## 2018-09-22 DIAGNOSIS — M7732 Calcaneal spur, left foot: Secondary | ICD-10-CM

## 2018-09-22 DIAGNOSIS — M722 Plantar fascial fibromatosis: Secondary | ICD-10-CM | POA: Diagnosis not present

## 2018-09-23 ENCOUNTER — Ambulatory Visit (INDEPENDENT_AMBULATORY_CARE_PROVIDER_SITE_OTHER): Payer: Medicare HMO | Admitting: Internal Medicine

## 2018-09-23 ENCOUNTER — Other Ambulatory Visit: Payer: Self-pay

## 2018-09-23 VITALS — BP 130/82 | HR 96 | Temp 97.0°F | Resp 16 | Ht 66.5 in | Wt 207.0 lb

## 2018-09-23 DIAGNOSIS — E559 Vitamin D deficiency, unspecified: Secondary | ICD-10-CM | POA: Diagnosis not present

## 2018-09-23 DIAGNOSIS — R7309 Other abnormal glucose: Secondary | ICD-10-CM | POA: Diagnosis not present

## 2018-09-23 DIAGNOSIS — Z79899 Other long term (current) drug therapy: Secondary | ICD-10-CM

## 2018-09-23 DIAGNOSIS — E782 Mixed hyperlipidemia: Secondary | ICD-10-CM | POA: Diagnosis not present

## 2018-09-23 DIAGNOSIS — R030 Elevated blood-pressure reading, without diagnosis of hypertension: Secondary | ICD-10-CM | POA: Diagnosis not present

## 2018-09-23 NOTE — Progress Notes (Signed)
Subjective: 68 year old female presents the office today for follow-up evaluation left heel pain.  She states she is not doing well and the heel still hurts.  She did get some improvement after the injections however the pain did come back.  She is also reporting cramping to her legs and she points more to her thighs.  Because of this she called the office and I ordered a venous duplex which was negative for blood clot.  She still gets pain to the bottom of her heel.  She has had some mild swelling.  She states that she will stretch and she will feel good for some time but then she when she sits down and stands back up will start to hurt again.  She will get relief for short amount of time.  No recent injury or falls. Denies any systemic complaints such as fevers, chills, nausea, vomiting. No acute changes since last appointment, and no other complaints at this time.   Objective: AAO x3, NAD DP/PT pulses palpable bilaterally, CRT less than 3 seconds There is still tenderness palpation on the plantar aspect the calcaneus.  No pain with lateral compression of calcaneus.  No pain along the course or insertion of the Achilles tendon.  Negative Tinel sign.  Achilles tendon appears to be intact.  No pain in the dorsal foot.  No pain to the forefoot or ankle.  No open lesions or pre-ulcerative lesions.  No pain with calf compression, swelling, warmth, erythema  Assessment: Left chronic heel pain.  Plan: -All treatment options discussed with the patient including all alternatives, risks, complications.  -At this time I recommend mobilization in the cam boot was dispensed today.  Encouraged ice elevation.  I will order an MRI of the left to a plantar fascial tear.  We discussed other treatment options including physical therapy, EPAT and as a last resort surgery.  However before going to the next option would get an MRI prior to starting therapy.  She is in agreement with this. -Stretching exercises and  hopefully help with cramping however she has a form with her primary care physician tomorrow as well.  We discussed a muscle relaxer. -Patient encouraged to call the office with any questions, concerns, change in symptoms.   Trula Slade DPM

## 2018-09-23 NOTE — Progress Notes (Signed)
History of Present Illness:      This very nice 68 y.o. MWF presents for 6 month follow up with labile HTN, HLD, Pre-Diabetes and Vitamin D Deficiency. Patient has GERD controlled on her meds.       Patient is followed expectantly with hx/o labile HTN & BP has been controlled and today's BP is at goal - 130/82. In June 2014, Patient had a false mildly positive Cardiolyte exonerated by a normal heart Cath . Patient has had no complaints of any cardiac type chest pain, palpitations, dyspnea, orthopnea / PND, dizziness, claudication, or dependent edema.      Patient has is on Lipitor and  Hyperlipidemia is  controlled. Patient denies myalgias or other med SE's. Last Lipids were at goal: Lab Results  Component Value Date   CHOL 155 09/23/2018   HDL 37 (L) 09/23/2018   LDLCALC 91 09/23/2018   TRIG 169 (H) 09/23/2018   CHOLHDL 4.2 09/23/2018        Also, the patient has history of PreDiabetes / Insulin Resistance and has had no symptoms of reactive hypoglycemia, diabetic polys, paresthesias or visual blurring.  Last A1c was Normal & at goal: Lab Results  Component Value Date   HGBA1C 5.5 09/23/2018        Further, the patient also has history of Vitamin D Deficiency ("16"/ 2008)  and supplements vitamin D without any suspected side-effects. Last vitamin D was still low (goal 70-100): Lab Results  Component Value Date   VD25OH 46 09/23/2018   Current Outpatient Medications on File Prior to Visit  Medication Sig  . ALPRAZolam (XANAX) 1 MG tablet Take 1/2 to 1 tablet 2 x /day  ONLY if needed for Anxiety Attack and please try to limit to 5 days /week to avoid addiction  . aspirin 81 MG tablet Take 81 mg by mouth daily.   Marland Kitchen atorvastatin (LIPITOR) 20 MG tablet Take 1 tablet Daily for Cholesterol  . CHOLECALCIFEROL PO Take 5,000 Units by mouth daily.  . fluticasone (FLONASE) 50 MCG/ACT nasal spray PLACE 2 SPRAYS INTO EACH NOSTRIL ONCE DAILY  . ibuprofen (ADVIL) 800 MG tablet Take 1 tablet (800  mg total) by mouth every 8 (eight) hours as needed.  . montelukast (SINGULAIR) 10 MG tablet TAKE 1 TABLET BY MOUTH ONCE DAILY  . omeprazole (PRILOSEC) 40 MG capsule Take 1 capsule by mouth once daily  . dicyclomine (BENTYL) 20 MG tablet Take 1 tablet (20 mg total) by mouth 2 (two) times daily. (Patient taking differently: Take 20 mg by mouth as needed. )   No current facility-administered medications on file prior to visit.    Allergies  Allergen Reactions  . Levofloxacin   . Prednisone    PMHx:   Past Medical History:  Diagnosis Date  . Anxiety and depression   . Depression   . Diverticulitis   . DIVERTICULITIS 04/19/2009   Qualifier: Diagnosis of  By: Deatra Ina MD, Sandy Salaam   . Hyperlipidemia   . Hypertension   . Irritable bowel syndrome    Immunization History  Administered Date(s) Administered  . Influenza, High Dose Seasonal PF 04/10/2018  . Tdap 08/27/2012   Past Surgical History:  Procedure Laterality Date  . CHOLECYSTECTOMY N/A 12/17/2013   Procedure: LAPAROSCOPIC CHOLECYSTECTOMY WITH INTRAOPERATIVE CHOLANGIOGRAM;  Surgeon: Fanny Skates, MD;  Location: [facility];  Service: General;  Laterality: N/A;  . COLON SURGERY  2012   Diverticulosis  . KNEE ARTHROSCOPY Right 2019   x 2  . TONSILLECTOMY    .  VAGINAL HYSTERECTOMY     FHx:    Reviewed / unchanged  SHx:    Reviewed / unchanged   Systems Review:  Constitutional: Denies fever, chills, wt changes, headaches, insomnia, fatigue, night sweats, change in appetite. Eyes: Denies redness, blurred vision, diplopia, discharge, itchy, watery eyes.  ENT: Denies discharge, congestion, post nasal drip, epistaxis, sore throat, earache, hearing loss, dental pain, tinnitus, vertigo, sinus pain, snoring.  CV: Denies chest pain, palpitations, irregular heartbeat, syncope, dyspnea, diaphoresis, orthopnea, PND, claudication or edema. Respiratory: denies cough, dyspnea, DOE, pleurisy, hoarseness, laryngitis, wheezing.   Gastrointestinal: Denies dysphagia, odynophagia, heartburn, reflux, water brash, abdominal pain or cramps, nausea, vomiting, bloating, diarrhea, constipation, hematemesis, melena, hematochezia  or hemorrhoids. Genitourinary: Denies dysuria, frequency, urgency, nocturia, hesitancy, discharge, hematuria or flank pain. Musculoskeletal: Denies arthralgias, myalgias, stiffness, jt. swelling, pain, limping or strain/sprain.  Skin: Denies pruritus, rash, hives, warts, acne, eczema or change in skin lesion(s). Neuro: No weakness, tremor, incoordination, spasms, paresthesia or pain. Psychiatric: Denies confusion, memory loss or sensory loss. Endo: Denies change in weight, skin or hair change.  Heme/Lymph: No excessive bleeding, bruising or enlarged lymph nodes.  Physical Exam  BP 130/82   Pulse 96   Temp (!) 97 F (36.1 C)   Resp 16   Ht 5' 6.5" (1.689 m)   Wt 207 lb (93.9 kg)   BMI 32.91 kg/m   Appears over nourished, well groomed  and in no distress.  Eyes: PERRLA, EOMs, conjunctiva no swelling or erythema. Sinuses: No frontal/maxillary tenderness ENT/Mouth: EAC's clear, TM's nl w/o erythema, bulging. Nares clear w/o erythema, swelling, exudates. Oropharynx clear without erythema or exudates. Oral hygiene is good. Tongue normal, non obstructing. Hearing intact.  Neck: Supple. Thyroid not palpable. Car 2+/2+ without bruits, nodes or JVD. Chest: Respirations nl with BS clear & equal w/o rales, rhonchi, wheezing or stridor.  Cor: Heart sounds normal w/ regular rate and rhythm without sig. murmurs, gallops, clicks or rubs. Peripheral pulses normal and equal  without edema.  Abdomen: Soft & bowel sounds normal. Non-tender w/o guarding, rebound, hernias, masses or organomegaly.  Lymphatics: Unremarkable.  Musculoskeletal: Full ROM all peripheral extremities, joint stability, 5/5 strength and normal gait.  Skin: Warm, dry without exposed rashes, lesions or ecchymosis apparent.  Neuro: Cranial  nerves intact, reflexes equal bilaterally. Sensory-motor testing grossly intact. Tendon reflexes grossly intact.  Pysch: Alert & oriented x 3.  Insight and judgement nl & appropriate. No ideations.  Assessment and Plan:  1. Elevated blood pressure reading without diagnosis of hypertension  - Continue periodic monitor blood pressure at home.  - Continue DASH diet.  Reminder to go to the ER if any CP,  SOB, nausea, dizziness, severe HA, changes vision/speech.  - CBC with Differential/Platelet - COMPLETE METABOLIC PANEL WITH GFR - Magnesium - TSH  2. Hyperlipidemia, mixed  - Continue diet/meds, exercise,& lifestyle modifications.  - Continue monitor periodic cholesterol/liver & renal functions   - Lipid panel - TSH  3. Abnormal glucose  - Continue diet, exercise  - Lifestyle modifications.  - Monitor appropriate labs.  - Hemoglobin A1c - Insulin, random  4. Vitamin D deficiency  - Continue supplementation.  - VITAMIN D 25 Hydroxyl  5. Medication management  - CBC with Differential/Platelet - COMPLETE METABOLIC PANEL WITH GFR - Magnesium - Lipid panel - TSH - Hemoglobin A1c - Insulin, random - VITAMIN D 25 Hydroxyl        Discussed  regular exercise, BP monitoring, weight control to achieve/maintain BMI less than 25  and discussed med and SE's. Recommended labs to assess and monitor clinical status with further disposition pending results of labs.  I discussed the assessment and treatment plan with the patient. The patient was provided an opportunity to ask questions and all were answered. The patient agreed with the plan and demonstrated an understanding of the instructions.  I provided over 30 minutes of exam, counseling, chart review and  complex critical decision making.   Kirtland Bouchard, MD

## 2018-09-23 NOTE — Patient Instructions (Signed)

## 2018-09-23 NOTE — Addendum Note (Signed)
Addended by: Cranford Mon R on: 09/23/2018 02:02 PM   Modules accepted: Orders

## 2018-09-24 LAB — COMPLETE METABOLIC PANEL WITH GFR
AG Ratio: 1.9 (calc) (ref 1.0–2.5)
ALT: 6 U/L (ref 6–29)
AST: 15 U/L (ref 10–35)
Albumin: 4.2 g/dL (ref 3.6–5.1)
Alkaline phosphatase (APISO): 104 U/L (ref 37–153)
BUN: 12 mg/dL (ref 7–25)
CO2: 26 mmol/L (ref 20–32)
Calcium: 9.3 mg/dL (ref 8.6–10.4)
Chloride: 107 mmol/L (ref 98–110)
Creat: 0.84 mg/dL (ref 0.50–0.99)
GFR, Est African American: 85 mL/min/{1.73_m2} (ref >=60)
GFR, Est Non African American: 73 mL/min/{1.73_m2} (ref >=60)
Globulin: 2.2 g/dL (calc) (ref 1.9–3.7)
Glucose, Bld: 87 mg/dL (ref 65–99)
Potassium: 4.3 mmol/L (ref 3.5–5.3)
Sodium: 142 mmol/L (ref 135–146)
Total Bilirubin: 0.4 mg/dL (ref 0.2–1.2)
Total Protein: 6.4 g/dL (ref 6.1–8.1)

## 2018-09-24 LAB — CBC WITH DIFFERENTIAL/PLATELET
Absolute Monocytes: 437 cells/uL (ref 200–950)
Basophils Absolute: 29 cells/uL (ref 0–200)
Basophils Relative: 0.6 %
Eosinophils Absolute: 168 cells/uL (ref 15–500)
Eosinophils Relative: 3.5 %
HCT: 37.9 % (ref 35.0–45.0)
Hemoglobin: 12.3 g/dL (ref 11.7–15.5)
Lymphs Abs: 1080 cells/uL (ref 850–3900)
MCH: 28.9 pg (ref 27.0–33.0)
MCHC: 32.5 g/dL (ref 32.0–36.0)
MCV: 89 fL (ref 80.0–100.0)
MPV: 11.4 fL (ref 7.5–12.5)
Monocytes Relative: 9.1 %
Neutro Abs: 3086 cells/uL (ref 1500–7800)
Neutrophils Relative %: 64.3 %
Platelets: 197 10*3/uL (ref 140–400)
RBC: 4.26 10*6/uL (ref 3.80–5.10)
RDW: 13.1 % (ref 11.0–15.0)
Total Lymphocyte: 22.5 %
WBC: 4.8 10*3/uL (ref 3.8–10.8)

## 2018-09-24 LAB — LIPID PANEL
Cholesterol: 155 mg/dL (ref <200)
HDL: 37 mg/dL — ABNORMAL LOW (ref >=50)
LDL Cholesterol (Calc): 91 mg/dL (calc)
Non-HDL Cholesterol (Calc): 118 mg/dL (calc) (ref <130)
Total CHOL/HDL Ratio: 4.2 (calc) (ref <5.0)
Triglycerides: 169 mg/dL — ABNORMAL HIGH (ref <150)

## 2018-09-24 LAB — HEMOGLOBIN A1C
Hgb A1c MFr Bld: 5.5 % of total Hgb (ref <5.7)
Mean Plasma Glucose: 111 (calc)
eAG (mmol/L): 6.2 (calc)

## 2018-09-24 LAB — VITAMIN D 25 HYDROXY (VIT D DEFICIENCY, FRACTURES): Vit D, 25-Hydroxy: 46 ng/mL (ref 30–100)

## 2018-09-24 LAB — MAGNESIUM: Magnesium: 1.9 mg/dL (ref 1.5–2.5)

## 2018-09-24 LAB — TSH: TSH: 3.61 mIU/L (ref 0.40–4.50)

## 2018-09-24 LAB — INSULIN, RANDOM: Insulin: 8.4 u[IU]/mL

## 2018-09-27 ENCOUNTER — Encounter: Payer: Self-pay | Admitting: Internal Medicine

## 2018-10-15 ENCOUNTER — Telehealth: Payer: Self-pay | Admitting: Podiatry

## 2018-10-15 ENCOUNTER — Telehealth: Payer: Self-pay | Admitting: *Deleted

## 2018-10-15 NOTE — Telephone Encounter (Signed)
Pt called states her insurance has denied her MRI, that is scheduled for tomorrow.

## 2018-10-15 NOTE — Telephone Encounter (Signed)
Pt is scheduled for an MRI tomorrow at [facility] and was called today and told that her MRI had been denied from [place]. Pt was told to call our office and see if we could follow up on the pre-cert and see if we can find out why it was denied.   Pts supplement insurance, [employer] approved the MRI but her Primary, Holland Falling did not.  Please give patient a call.

## 2018-10-15 NOTE — Telephone Encounter (Signed)
Pt called and asked to speak to the person pre-certing the MRI and asked if she should reschedule. I told pt that in these cases pts generally schedule 3-5 business days out.

## 2018-10-16 ENCOUNTER — Other Ambulatory Visit: Payer: BC Managed Care – PPO

## 2018-10-20 ENCOUNTER — Telehealth: Payer: Self-pay | Admitting: *Deleted

## 2018-10-20 NOTE — Telephone Encounter (Signed)
Pt called states call [facility] and give the number from the insurance company.

## 2018-10-20 NOTE — Telephone Encounter (Signed)
Gabriel Rung states Reference: [id number] has been denied and the decision of the appeal has not been sent to [facility].

## 2018-10-20 NOTE — Telephone Encounter (Signed)
Pt states the [employer] had been approved on 10/16/2018 and asked if I had received in our office. I told pt I had not received the fax but would look in the chart, fax received file and scan section. I told pt she could call and schedule with [facility], they often will get the faxed approval or have called for the approval. Pt states she would like to have the MRI performed at [facility] they had openings at the end of the week. I asked if she had contacted [facility] to see if had cancellation scheduling. Pt states she had been doing that all along. I told pt I would needed to contact [place] and [employer] to change the location. Pt started to yell that she was getting the run around and I told her I was trying to help her. Pt states she would call the insurance herself. I started to give her my fax machine number and she hung up.

## 2018-10-20 NOTE — Telephone Encounter (Signed)
[employer] - Medicare-Teresa Vasquez called with Authorization:  [id number], and asked if she could assist with the change of location on this case. Teresa Vasquez transferred to [employer]. Teresa G. states she can not change location until the health plan has sent the appeal to them and she was discussing with the Physician Support Group as to how to make this location change. Teresa Vasquez states she spoke with a supervisor, there is nothing that can be done on this case until the [employer] sends the authorization/approval letter to them.

## 2018-10-20 NOTE — Telephone Encounter (Signed)
I called [facility]'s central phone and was on hold for 6 minutes to get NPI [id number], and the location for the fax number given by the pt [phone] and the phone number [phone], [phone].

## 2018-10-20 NOTE — Telephone Encounter (Signed)
I informed pt of the call to [employer] to change location to [facility] and the multiple calls to [place] and [place] for a resolution to the MRI appeal then approval. Pt states she will call [employer] and call me again. I gave pt my direct line [phone], and direct fax [phone].

## 2018-10-20 NOTE — Telephone Encounter (Signed)
[employer] - Medicare-Yolanda was assisting, then scheduler - L. Sallee Provencal stated there was a Youth worker on the phone with [phone]. I told Denman George I had another representative with Authorization on the line and would need to hang up.

## 2018-10-20 NOTE — Telephone Encounter (Signed)
[employer] called the [place] office stating that the were calling with authorization for MRI Auth # [id number] 10/16/18 approved for scheduling  [facility]

## 2018-10-20 NOTE — Telephone Encounter (Signed)
[employer] - [facility] changed the MRI location to [facility] NPI:  [id number] valid 09/23/2018 - 03/26/2019.

## 2018-10-20 NOTE — Telephone Encounter (Addendum)
I informed pt that if I sent in the orders to [facility], I would have to take the orders out and it would take out the [facility] appt that is already scheduled. Pt states she spoke to [employer] and was informed that it could take [employer] up to 30 days to send the PA to [place]. Pt states she does have an appt set up with [facility] and not [facility].  Pt stated just leave at [place]. I told pt that I would call [employer] and change back to [place].

## 2018-10-21 NOTE — Telephone Encounter (Signed)
Left message informing pt the [employer] PA had been changed back to [place].

## 2018-10-21 NOTE — Telephone Encounter (Signed)
[employer] Bertram Millard changed [id number] location to [place] NPI: [id number] for MRI left ankle [id number], valid 09/28/2018 - 03/26/2019.

## 2018-10-21 NOTE — Telephone Encounter (Addendum)
I called [facility] and asked to schedule pt earlier. Gwyndolyn Saxon checked scheduling and stated he did not see any change in scheduling or cancellations. Faxed orders with PA for [employer] and [employer] to [place].

## 2018-11-02 ENCOUNTER — Other Ambulatory Visit: Payer: Self-pay | Admitting: Internal Medicine

## 2018-11-02 DIAGNOSIS — F411 Generalized anxiety disorder: Secondary | ICD-10-CM

## 2018-11-02 MED ORDER — ALPRAZOLAM 1 MG PO TABS: ORAL_TABLET | ORAL | 0 refills | Status: DC

## 2018-11-04 ENCOUNTER — Ambulatory Visit
Admission: RE | Admit: 2018-11-04 | Discharge: 2018-11-04 | Disposition: A | Discharge status: Home / Self Care | Payer: BC Managed Care – PPO | Source: Ambulatory Visit | Attending: Podiatry | Admitting: Podiatry

## 2018-11-04 ENCOUNTER — Other Ambulatory Visit: Payer: Self-pay

## 2018-11-04 DIAGNOSIS — S96912A Strain of unspecified muscle and tendon at ankle and foot level, left foot, initial encounter: Secondary | ICD-10-CM | POA: Diagnosis not present

## 2018-11-04 DIAGNOSIS — M79672 Pain in left foot: Secondary | ICD-10-CM

## 2018-11-04 MED ORDER — GADOBENATE DIMEGLUMINE 529 MG/ML IV SOLN
18.0000 mL | Freq: Once | INTRAVENOUS | Status: AC | PRN
  Administered 2018-11-04: 18 mL via INTRAVENOUS

## 2018-11-05 ENCOUNTER — Other Ambulatory Visit: Payer: Self-pay | Admitting: Podiatry

## 2018-11-05 ENCOUNTER — Telehealth: Payer: Self-pay | Admitting: Podiatry

## 2018-11-05 MED ORDER — MELOXICAM 15 MG PO TABS: 15.0000 mg | ORAL_TABLET | Freq: Every day | ORAL | 0 refills | Status: DC

## 2018-11-05 NOTE — Telephone Encounter (Signed)
Pt called to say she had her MRI done last night and wanted to go ahead an schedule an appointment to come in for the results. I scheduled her for Friday, 18 September and put her on a cancellation list for any sooner appointments that may come open before then. Pt is upset because someone in the office screwed up with her insurance in regards to getting her MRI pre-certed I'm assuming. It was denied before it was authorized. She states she is upset because this has now taken about 2 months and she wanted me to let you know.

## 2018-11-05 NOTE — Telephone Encounter (Signed)
I called her to go over the results anyway today and realized she called.   I would like to scheduled her for EPAT. We can do it no charge since she had issues and the delay. Can you call her to get it scheduled ASAP?

## 2018-11-05 NOTE — Telephone Encounter (Signed)
Pt is scheduled for 8:30 am on Tuesday to see Janett Billow, RN for her first EPAT. I told the pt to bring her insurance cards with her and put a note at the front of the visit notes to scan her insurance cards.

## 2018-11-06 ENCOUNTER — Other Ambulatory Visit: Payer: Self-pay | Admitting: Internal Medicine

## 2018-11-06 DIAGNOSIS — F411 Generalized anxiety disorder: Secondary | ICD-10-CM

## 2018-11-06 MED ORDER — ALPRAZOLAM 1 MG PO TABS: ORAL_TABLET | ORAL | 0 refills | Status: DC

## 2018-11-10 ENCOUNTER — Ambulatory Visit: Payer: Self-pay

## 2018-11-10 ENCOUNTER — Other Ambulatory Visit: Payer: Self-pay

## 2018-11-10 DIAGNOSIS — M722 Plantar fascial fibromatosis: Secondary | ICD-10-CM

## 2018-11-20 ENCOUNTER — Other Ambulatory Visit: Payer: Self-pay

## 2018-11-20 ENCOUNTER — Ambulatory Visit: Payer: Medicare HMO

## 2018-11-20 ENCOUNTER — Ambulatory Visit: Payer: Medicare HMO | Admitting: Podiatry

## 2018-11-20 DIAGNOSIS — M722 Plantar fascial fibromatosis: Secondary | ICD-10-CM

## 2018-11-26 ENCOUNTER — Other Ambulatory Visit: Payer: Self-pay | Admitting: Internal Medicine

## 2018-11-27 ENCOUNTER — Ambulatory Visit: Payer: Medicare HMO

## 2018-11-27 ENCOUNTER — Other Ambulatory Visit: Payer: Self-pay

## 2018-11-27 DIAGNOSIS — M722 Plantar fascial fibromatosis: Secondary | ICD-10-CM

## 2018-12-03 NOTE — Progress Notes (Signed)
Patient is here today with complaint of plantar fasciitis in her left foot, she is tried various treatments with no success.  She is here today for epat treatment.  ESWT administered for 5 J and tolerated well.  She is to follow-up next week for her second appointment.

## 2018-12-08 NOTE — Progress Notes (Signed)
Patient is here today with complaint of plantar fasciitis in her left foot, she is tried various treatments with no success.  She is here today for epat treatment.  ESWT administered for 6.5 J and tolerated well.  She is to follow-up in 2 weeks for 4th appointment.

## 2018-12-08 NOTE — Progress Notes (Signed)
Patient is here today with complaint of plantar fasciitis in her left foot, she is tried various treatments with no success.  She is here today for epat treatment.  ESWT administered for 6 J and tolerated well.  She is to follow-up next week for her second appointment.

## 2018-12-10 ENCOUNTER — Other Ambulatory Visit: Payer: Self-pay

## 2018-12-10 ENCOUNTER — Ambulatory Visit: Payer: Medicare HMO

## 2018-12-10 DIAGNOSIS — M722 Plantar fascial fibromatosis: Secondary | ICD-10-CM

## 2018-12-10 NOTE — Progress Notes (Signed)
Patient is here today with complaint of plantar fasciitis in her left foot, she is tried various treatments with no success.  She is here today for epat treatment.  ESWT administered for 14 J and tolerated well.  She is to follow-up in 2 weeks for 5th appointment.

## 2018-12-14 DIAGNOSIS — Z1231 Encounter for screening mammogram for malignant neoplasm of breast: Secondary | ICD-10-CM | POA: Diagnosis not present

## 2018-12-14 DIAGNOSIS — Z6833 Body mass index (BMI) 33.0-33.9, adult: Secondary | ICD-10-CM | POA: Diagnosis not present

## 2018-12-14 DIAGNOSIS — Z01419 Encounter for gynecological examination (general) (routine) without abnormal findings: Secondary | ICD-10-CM | POA: Diagnosis not present

## 2018-12-16 LAB — HM MAMMOGRAPHY

## 2018-12-24 ENCOUNTER — Ambulatory Visit: Payer: Medicare HMO | Admitting: Adult Health

## 2018-12-24 ENCOUNTER — Encounter: Payer: Self-pay | Admitting: *Deleted

## 2018-12-24 ENCOUNTER — Other Ambulatory Visit: Payer: Medicare HMO

## 2019-01-08 ENCOUNTER — Other Ambulatory Visit: Payer: Self-pay

## 2019-01-08 ENCOUNTER — Ambulatory Visit: Payer: Medicare HMO

## 2019-01-08 DIAGNOSIS — M722 Plantar fascial fibromatosis: Secondary | ICD-10-CM

## 2019-01-08 NOTE — Progress Notes (Signed)
FOLLOW UP  Assessment and Plan:   Major depressive disorder with recurent episode, in partial remission Declines daily medication; recent PHQ-9 of 1; she feels well managed with lifestyle changes  Encouraged therapy/support group via hospice  Lifestyle discussed: diet/exerise, sleep hygiene, stress management, hydration  Anxiety Fairly managed by current regimen; continue PRN xanax; reminded to limit to <5 days/week, if increasing use needs daily agent  Stress management techniques discussed, increase water, good sleep hygiene discussed, increase exercise, and increase veggies.   Elevated blood pressure reading without diagnosis of hypertension  - continue medications, DASH diet, exercise and monitor at home. Call if greater than 130/80.  -     CBC with Differential/Platelet -     CMP/GFR  Other abnormal glucose/ hx of prediabetes Discussed disease progression and risks Discussed diet/exercise, weight management and risk modification A1c annually and as indicated; recently at goal; defer today; monitor weight and serum glucose   Mixed hyperlipidemia -continue medications, check lipids, decrease fatty foods, increase activity.  -     Lipid panel  Vitamin D deficiency Near goal at recent check;  continue to recommend supplementation  Defer vitamin D level  Obesity - BMI 33 Long discussion about weight loss, diet, and exercise Recommended diet heavy in fruits and veggies and low in animal meats, cheeses, and dairy products, appropriate calorie intake Discussed appropriate weight for height  Follow up at next visit   GERD Well managed on current medications  Discussed diet, avoiding triggers and other lifestyle changes  Medication management -     Magnesium  Right plantar fascitis Managed by foot center; continue brace, exercises, antiinflammatories  Need for influenza vaccine - Quad valent high dose administered without complicatoin  Continue diet and meds as  discussed. Further disposition pending results of labs. Discussed med's effects and SE's.   Over 30 minutes of exam, counseling, chart review, and critical decision making was performed.   Future Appointments  Date Time Provider [place]  01/22/2019 10:30 AM [facility] NURSE [facility]  03/29/2019  2:00 PM Liane Comber, NP [facility] None  06/29/2019  3:00 PM Liane Comber, NP [facility] None    ----------------------------------------------------------------------------------------------------------------------  HPI 68 y.o. female  presents for 3 month follow up on BP, cholesterol, glucose, anxiety, GERD, weight and vitamin D deficiency.   She is helping take care of her sister who is in hospice with terminal brain tumor and expected to pass away this week.   She has plantar fascitis ongoing since Jan 2020, followed by [facility] and recently started on mobic and had steroid shot and foot brace, finishing up therapy next week ? "EPAT", doing ibuprofen 800 mg q8h PRN and feeling much improved.   She has hx of of depression/anxiety but feels she is doing fairly off of medications recently (PHQ score of 1 in 06/2018); states as long as she sleeps well she manages; She is currently prescribed xanax, uses mainly in the evening for sleep PRN to calm her mind down and rest.    She has GERD and is currently taking omeprazole 20 mg and feels well controlled.   BMI is Body mass index is 33.86 kg/m., she has not been working on diet and exercise. Wt Readings from Last 3 Encounters:  01/11/19 213 lb (96.6 kg)  09/23/18 207 lb (93.9 kg)  06/16/18 207 lb (93.9 kg)   Her blood pressure has been controlled at home, today their BP is BP: 126/80  She does not workout. She denies chest pain,  shortness of breath, dizziness.   She is on cholesterol medication Atorvastatin 20 mg daily and denies myalgias. Her cholesterol is at goal. The cholesterol last visit was:   Lab Results   Component Value Date   CHOL 155 09/23/2018   HDL 37 (L) 09/23/2018   LDLCALC 91 09/23/2018   TRIG 169 (H) 09/23/2018   CHOLHDL 4.2 09/23/2018    She has been working on diet and exercise for glucose management (hx of prediabetes, 5.7% in 12/2014, 5.8% in 03/2018) and denies increased appetite, nausea, paresthesia of the feet, polydipsia, polyuria, visual disturbances and vomiting. Last A1C in the office was:  Lab Results  Component Value Date   HGBA1C 5.5 09/23/2018    She has stable CKD II:  Lab Results  Component Value Date   GFRNONAA 73 09/23/2018   Patient is on Vitamin D supplement.   Lab Results  Component Value Date   VD25OH 46 09/23/2018        Current Medications:  Current Outpatient Medications on File Prior to Visit  Medication Sig  . ALPRAZolam (XANAX) 1 MG tablet Take 1/2 to 1 tablet 2 x /day  ONLY if needed for Anxiety Attack and please try to limit to 5 days /week to avoid addiction  . aspirin 81 MG tablet Take 81 mg by mouth daily.   Marland Kitchen atorvastatin (LIPITOR) 20 MG tablet Take 1 tablet Daily for Cholesterol  . CHOLECALCIFEROL PO Take 5,000 Units by mouth daily.  Marland Kitchen dicyclomine (BENTYL) 20 MG tablet Take 1 tablet (20 mg total) by mouth 2 (two) times daily. (Patient taking differently: Take 20 mg by mouth as needed. )  . fluticasone (FLONASE) 50 MCG/ACT nasal spray PLACE 2 SPRAYS INTO EACH NOSTRIL ONCE DAILY  . ibuprofen (ADVIL) 800 MG tablet Take 1 tablet (800 mg total) by mouth every 8 (eight) hours as needed.  Marland Kitchen MAGNESIUM CITRATE PO Take 400 mg by mouth daily.  . montelukast (SINGULAIR) 10 MG tablet TAKE 1 TABLET BY MOUTH ONCE DAILY  . Omega-3 Fatty Acids (OMEGA-3 FISH OIL PO) Take by mouth daily.  Marland Kitchen omeprazole (PRILOSEC) 40 MG capsule Take 1 capsule Daily for Indigestion & Heartburn  . POTASSIUM PO Take by mouth daily.  . vitamin C (ASCORBIC ACID) 250 MG tablet Take 250 mg by mouth daily.   No current facility-administered medications on file prior to visit.       Allergies:  Allergies  Allergen Reactions  . Levofloxacin   . Prednisone      Medical History:  Past Medical History:  Diagnosis Date  . Anxiety and depression   . Depression   . Diverticulitis   . DIVERTICULITIS 04/19/2009   Qualifier: Diagnosis of  By: Deatra Ina MD, Sandy Salaam   . Hyperlipidemia   . Hypertension   . Irritable bowel syndrome    Family history- Reviewed and unchanged Social history- Reviewed and unchanged   Review of Systems:  Review of Systems  Constitutional: Negative for malaise/fatigue and weight loss.  HENT: Negative for hearing loss and tinnitus.   Eyes: Negative for blurred vision and double vision.  Respiratory: Negative for cough, shortness of breath and wheezing.   Cardiovascular: Negative for chest pain, palpitations, orthopnea, claudication and leg swelling.  Gastrointestinal: Negative for abdominal pain, blood in stool, constipation, diarrhea, heartburn, melena, nausea and vomiting.  Genitourinary: Negative.   Musculoskeletal: Negative for joint pain and myalgias.  Skin: Negative for rash.  Neurological: Negative for dizziness, tingling, sensory change, weakness and headaches.  Endo/Heme/Allergies: Negative  for polydipsia.  Psychiatric/Behavioral: Negative.   All other systems reviewed and are negative.     Physical Exam: BP 126/80   Pulse 88   Temp 97.7 F (36.5 C)   Ht 5' 6.5" (1.689 m)   Wt 213 lb (96.6 kg)   SpO2 96%   BMI 33.86 kg/m  Wt Readings from Last 3 Encounters:  01/11/19 213 lb (96.6 kg)  09/23/18 207 lb (93.9 kg)  06/16/18 207 lb (93.9 kg)   General Appearance: Well nourished, in no apparent distress. Eyes: PERRLA, EOMs, conjunctiva no swelling or erythema Sinuses: No Frontal/maxillary tenderness ENT/Mouth: Ext aud canals clear, TMs without erythema, bulging. No erythema, swelling, or exudate on post pharynx.  Tonsils not swollen or erythematous. Hearing normal.  Neck: Supple, thyroid normal.  Respiratory:  Respiratory effort normal, BS equal bilaterally without rales, rhonchi, wheezing or stridor.  Cardio: RRR with no MRGs. Brisk peripheral pulses without edema.  Abdomen: Soft, + BS.  Non tender, no guarding, rebound, hernias, masses. Lymphatics: Non tender without lymphadenopathy.  Musculoskeletal: Full ROM, 5/5 strength, Normal gait Skin: Warm, dry without rashes, lesions, ecchymosis.  Neuro: Cranial nerves intact. No cerebellar symptoms.  Psych: Awake and oriented X 3, normal affect, Insight and Judgment appropriate.    [name], NP 4:01 PM [facility]

## 2019-01-08 NOTE — Progress Notes (Signed)
Patient is here today with complaint of plantar fasciitis in her left foot, she is tried various treatments with no success.  She is here today for epat treatment.  ESWT administered for 15J and tolerated well.  She is to follow-up in 2 weeks for 6th  And final appointment.

## 2019-01-11 ENCOUNTER — Other Ambulatory Visit: Payer: Self-pay

## 2019-01-11 ENCOUNTER — Encounter: Payer: Self-pay | Admitting: Adult Health

## 2019-01-11 ENCOUNTER — Ambulatory Visit (INDEPENDENT_AMBULATORY_CARE_PROVIDER_SITE_OTHER): Payer: Medicare HMO | Admitting: Adult Health

## 2019-01-11 VITALS — BP 126/80 | HR 88 | Temp 97.7°F | Ht 66.5 in | Wt 213.0 lb

## 2019-01-11 DIAGNOSIS — R7309 Other abnormal glucose: Secondary | ICD-10-CM | POA: Diagnosis not present

## 2019-01-11 DIAGNOSIS — E782 Mixed hyperlipidemia: Secondary | ICD-10-CM

## 2019-01-11 DIAGNOSIS — E669 Obesity, unspecified: Secondary | ICD-10-CM | POA: Diagnosis not present

## 2019-01-11 DIAGNOSIS — E66811 Obesity, class 1: Secondary | ICD-10-CM

## 2019-01-11 DIAGNOSIS — F419 Anxiety disorder, unspecified: Secondary | ICD-10-CM

## 2019-01-11 DIAGNOSIS — E559 Vitamin D deficiency, unspecified: Secondary | ICD-10-CM

## 2019-01-11 DIAGNOSIS — K219 Gastro-esophageal reflux disease without esophagitis: Secondary | ICD-10-CM | POA: Diagnosis not present

## 2019-01-11 DIAGNOSIS — Z79899 Other long term (current) drug therapy: Secondary | ICD-10-CM

## 2019-01-11 DIAGNOSIS — F3341 Major depressive disorder, recurrent, in partial remission: Secondary | ICD-10-CM

## 2019-01-11 DIAGNOSIS — R69 Illness, unspecified: Secondary | ICD-10-CM | POA: Diagnosis not present

## 2019-01-11 NOTE — Patient Instructions (Signed)
Goals    . DIET - INCREASE WATER INTAKE     65+ fluid ounces daily     . Exercise 150 min/wk Moderate Activity    . Weight (lb) < 200 lb (90.7 kg)         Preventing Influenza, Adult Influenza, more commonly known as "the flu," is a viral infection that mainly affects the respiratory tract. The respiratory tract includes structures that help you breathe, such as the lungs, nose, and throat. The flu causes many common cold symptoms, as well as a high fever and body aches. The flu spreads easily from person to person (is contagious). The flu is most common from December through March. This is called flu season.You can catch the flu virus by:  Breathing in droplets from an infected person's cough or sneeze.  Touching something that was recently contaminated with the virus and then touching your mouth, nose, or eyes. What can I do to lower my risk?        You can decrease your risk of getting the flu by:  Getting a flu shot (influenza vaccination) every year. This is the best way to prevent the flu. A flu shot is recommended for everyone age 72 months and older. ? It is best to get a flu shot in the fall, as soon as it is available. Getting a flu shot during winter or spring instead is still a good idea. Flu season can last into early spring. ? Preventing the flu through vaccination requires getting a new flu shot every year. This is because the flu virus changes slightly (mutates) from one year to the next. Even if a flu shot does not completely protect you from all flu virus mutations, it can reduce the severity of your illness and prevent dangerous complications of the flu. ? If you are pregnant, you can and should get a flu shot. ? If you have had a reaction to the shot in the past or if you are allergic to eggs, check with your health care provider before getting a flu shot. ? Sometimes the vaccine is available as a nasal spray. In some years, the nasal spray has not been as effective  against the flu virus. Check with your health care provider if you have questions about this.  Practicing good health habits. This is especially important during flu season. ? Avoid contact with people who are sick with flu or cold symptoms. ? Wash your hands with soap and water often. If soap and water are not available, use alcohol-based hand sanitizer. ? Avoid touching your hands to your face, especially when you have not washed your hands recently. ? Use a disinfectant to clean surfaces at home and at work that may be contaminated with the flu virus. ? Keep your body's disease-fighting system (immune system) in good shape by eating a healthy diet, drinking plenty of fluids, getting enough sleep, and exercising regularly. If you do get the flu, avoid spreading it to others by:  Staying home until your symptoms have been gone for at least one day.  Covering your mouth and nose when you cough or sneeze.  Avoiding close contact with others, especially babies and elderly people. Why are these changes important? Getting a flu shot and practicing good health habits protects you as well as other people. If you get the flu, your friends, family, and co-workers are also at risk of getting it, because it spreads so easily to others. Each year, about 2 out of  every 10 people get the flu. Having the flu can lead to complications, such as pneumonia, ear infection, and sinus infection. The flu also can be deadly, especially for babies, people older than age 66, and people who have serious long-term diseases. How is this treated? Most people recover from the flu by resting at home and drinking plenty of fluids. However, a prescription antiviral medicine may reduce your flu symptoms and may make your flu go away sooner. This medicine must be started within a few days of getting flu symptoms. You can talk with your health care provider about whether you need an antiviral medicine. Antiviral medicine may be  prescribed for people who are at risk for more serious flu symptoms. This includes people who:  Are older than age 66.  Are pregnant.  Have a condition that makes the flu worse or more dangerous. Where to find more information  Centers for Disease Control and Prevention: http://www.smith-bell.org/  [employer].com.ee: azureicus.com  American Academy of Family Physicians: familydoctor.org/familydoctor/en/kids/vaccines/preventing-the-flu.html Contact a health care provider if:  You have influenza and you develop new symptoms.  You have: ? Chest pain. ? Diarrhea. ? A fever.  Your cough gets worse, or you produce more mucus. Summary  The best way to prevent the flu is to get a flu shot every year in the fall.  Even if you get the flu after you have received the yearly vaccine, your flu may be milder and go away sooner because of your flu shot.  If you get the flu, antiviral medicines that are started with a few days of symptoms may reduce your flu symptoms and may make your flu go away sooner.  You can also help prevent the flu by practicing good health habits. This information is not intended to replace advice given to you by your health care provider. Make sure you discuss any questions you have with your health care provider. Document Released: 03/05/2015 Document Revised: 01/31/2017 Document Reviewed: 10/28/2015 Elsevier Patient Education  2020 [employer].

## 2019-01-12 ENCOUNTER — Other Ambulatory Visit: Payer: Self-pay | Admitting: Adult Health

## 2019-01-12 LAB — COMPLETE METABOLIC PANEL WITH GFR
AG Ratio: 1.8 (calc) (ref 1.0–2.5)
ALT: 8 U/L (ref 6–29)
AST: 17 U/L (ref 10–35)
Albumin: 4.4 g/dL (ref 3.6–5.1)
Alkaline phosphatase (APISO): 127 U/L (ref 37–153)
BUN: 14 mg/dL (ref 7–25)
CO2: 31 mmol/L (ref 20–32)
Calcium: 9.8 mg/dL (ref 8.6–10.4)
Chloride: 105 mmol/L (ref 98–110)
Creat: 0.8 mg/dL (ref 0.50–0.99)
GFR, Est African American: 89 mL/min/{1.73_m2} (ref >=60)
GFR, Est Non African American: 77 mL/min/{1.73_m2} (ref >=60)
Globulin: 2.4 g/dL (calc) (ref 1.9–3.7)
Glucose, Bld: 90 mg/dL (ref 65–99)
Potassium: 4.9 mmol/L (ref 3.5–5.3)
Sodium: 141 mmol/L (ref 135–146)
Total Bilirubin: 0.4 mg/dL (ref 0.2–1.2)
Total Protein: 6.8 g/dL (ref 6.1–8.1)

## 2019-01-12 LAB — LIPID PANEL
Cholesterol: 177 mg/dL (ref <200)
HDL: 36 mg/dL — ABNORMAL LOW (ref >=50)
LDL Cholesterol (Calc): 110 mg/dL (calc) — ABNORMAL HIGH
Non-HDL Cholesterol (Calc): 141 mg/dL (calc) — ABNORMAL HIGH (ref <130)
Total CHOL/HDL Ratio: 4.9 (calc) (ref <5.0)
Triglycerides: 191 mg/dL — ABNORMAL HIGH (ref <150)

## 2019-01-12 LAB — CBC WITH DIFFERENTIAL/PLATELET
Absolute Monocytes: 432 cells/uL (ref 200–950)
Basophils Absolute: 49 cells/uL (ref 0–200)
Basophils Relative: 0.9 %
Eosinophils Absolute: 162 cells/uL (ref 15–500)
Eosinophils Relative: 3 %
HCT: 38.2 % (ref 35.0–45.0)
Hemoglobin: 12.4 g/dL (ref 11.7–15.5)
Lymphs Abs: 1215 cells/uL (ref 850–3900)
MCH: 28.4 pg (ref 27.0–33.0)
MCHC: 32.5 g/dL (ref 32.0–36.0)
MCV: 87.4 fL (ref 80.0–100.0)
MPV: 11.4 fL (ref 7.5–12.5)
Monocytes Relative: 8 %
Neutro Abs: 3542 cells/uL (ref 1500–7800)
Neutrophils Relative %: 65.6 %
Platelets: 210 10*3/uL (ref 140–400)
RBC: 4.37 10*6/uL (ref 3.80–5.10)
RDW: 13.1 % (ref 11.0–15.0)
Total Lymphocyte: 22.5 %
WBC: 5.4 10*3/uL (ref 3.8–10.8)

## 2019-01-12 LAB — MAGNESIUM: Magnesium: 2 mg/dL (ref 1.5–2.5)

## 2019-01-12 LAB — TSH: TSH: 3.5 mIU/L (ref 0.40–4.50)

## 2019-01-12 MED ORDER — ATORVASTATIN CALCIUM 40 MG PO TABS: ORAL_TABLET | ORAL | 3 refills | Status: DC

## 2019-01-22 ENCOUNTER — Ambulatory Visit: Payer: Medicare HMO | Admitting: *Deleted

## 2019-01-22 ENCOUNTER — Other Ambulatory Visit: Payer: Self-pay

## 2019-01-22 DIAGNOSIS — M722 Plantar fascial fibromatosis: Secondary | ICD-10-CM

## 2019-01-22 NOTE — Progress Notes (Signed)
Patient presents for the 6th EPAT  treatment today with complaint of plantar heel pain left. Diagnosed with plantar fasciitis by Dr. Jacqualyn Posey. This has been ongoing for several months. The patient has tried ice, stretching, NSAIDS and supportive shoe gear with no long term relief. Most of the pain is located medial heel and arch. She is also having some lateral foot pain as well.She states she is 80% better than where she was at initial treatment. She has little "twinges" of pain intermittently.   ESWT administered and tolerated well. Treatment settings initiated at:   Energy: 25 for 1000 shocks  Ended treatment session today with 3000 shocks at the following settings:   Energy: 20 for the next 2000  Frequency: 5.0  Joules: 21.39  Arch massager was utilized x 3 rounds  Advised to avoid NSAIDs and ice and to utilize boot or supportive shoes for at least the next 3 days.  Follow up in 4 weeks with Dr. Jacqualyn Posey for re-evaluation.

## 2019-02-05 DIAGNOSIS — Z20828 Contact with and (suspected) exposure to other viral communicable diseases: Secondary | ICD-10-CM | POA: Diagnosis not present

## 2019-02-22 ENCOUNTER — Encounter: Payer: Self-pay | Admitting: Podiatry

## 2019-02-22 ENCOUNTER — Other Ambulatory Visit: Payer: Self-pay

## 2019-02-22 ENCOUNTER — Ambulatory Visit (INDEPENDENT_AMBULATORY_CARE_PROVIDER_SITE_OTHER): Payer: Medicare HMO | Admitting: Podiatry

## 2019-02-22 DIAGNOSIS — M722 Plantar fascial fibromatosis: Secondary | ICD-10-CM | POA: Diagnosis not present

## 2019-02-22 NOTE — Patient Instructions (Signed)

## 2019-02-28 NOTE — Progress Notes (Signed)
Subjective: 69 year old female presents the office for follow-up evaluation of left chronic heel pain, plan fasciitis.  She did complete the course of EPAT and she states that the heel is doing much better.  She states that she can tell a noticeable difference in start of the treatment.  She did not occasional discomfort at times but overall improved.  No recent injury.  No recent weakness or falls any radiating pain.  When she does get pain is still isolated to the bottom of the heel. Denies any systemic complaints such as fevers, chills, nausea, vomiting. No acute changes since last appointment, and no other complaints at this time.   Objective: AAO x3, NAD DP/PT pulses palpable bilaterally, CRT less than 3 seconds Minimal tenderness palpation on the plantar medial/lateral tubercle of the calcaneus at the insertion of the plantar fascia on the left foot.  Negative Tinel sign.  No pain to the Achilles tendon.  Plantar fascia, Achilles tendon appear to be intact.  No areas of discomfort. No pain with calf compression, swelling, warmth, erythema  Assessment: Left plantar fasciitis with improvement  Plan: -All treatment options discussed with the patient including all alternatives, risks, complications.  -I want her to get back to doing the stretching, icing exercises daily or she can do ice/heat contrast.  We discussed changing shoes and also inserts for her shoes.  Discussed steroid injection but she is doing much better so we will hold off on this today. -Patient encouraged to call the office with any questions, concerns, change in symptoms.   Trula Slade DPM

## 2019-03-02 ENCOUNTER — Other Ambulatory Visit: Payer: Self-pay | Admitting: Internal Medicine

## 2019-03-02 DIAGNOSIS — F411 Generalized anxiety disorder: Secondary | ICD-10-CM

## 2019-03-02 MED ORDER — ALPRAZOLAM 1 MG PO TABS: ORAL_TABLET | ORAL | 0 refills | Status: DC

## 2019-03-15 ENCOUNTER — Other Ambulatory Visit: Payer: Self-pay | Admitting: Adult Health

## 2019-03-15 ENCOUNTER — Encounter: Payer: Self-pay | Admitting: Adult Health

## 2019-03-15 ENCOUNTER — Telehealth: Payer: Self-pay

## 2019-03-15 ENCOUNTER — Other Ambulatory Visit: Payer: Self-pay

## 2019-03-15 MED ORDER — ESCITALOPRAM OXALATE 20 MG PO TABS: 20.0000 mg | ORAL_TABLET | Freq: Every day | ORAL | 0 refills | Status: DC

## 2019-03-15 NOTE — Telephone Encounter (Signed)
Patient advised. Resent rx to [facility] to use [facility] and pay cash instead.

## 2019-03-15 NOTE — Telephone Encounter (Signed)
Patient states that with the death of her sister, she feels she needs something for anxiety. She did mention this to you during her recent visit but at that time she felt that she didn't need anything but that has changed. Please advise.   [employer]

## 2019-03-21 DIAGNOSIS — J0140 Acute pansinusitis, unspecified: Secondary | ICD-10-CM | POA: Diagnosis not present

## 2019-03-23 DIAGNOSIS — Z03818 Encounter for observation for suspected exposure to other biological agents ruled out: Secondary | ICD-10-CM | POA: Diagnosis not present

## 2019-03-23 DIAGNOSIS — Z20828 Contact with and (suspected) exposure to other viral communicable diseases: Secondary | ICD-10-CM | POA: Diagnosis not present

## 2019-03-29 ENCOUNTER — Encounter: Payer: Self-pay | Admitting: Adult Health

## 2019-04-19 NOTE — Progress Notes (Signed)
Complete Physical  Assessment and Plan:   Encounter for general adult medical examination with abnormal findings 1 year  Allergic rhinitis due to pollen, unspecified seasonality Continue allergy medication  Irritable bowel syndrome, unspecified type If not on benefiber then add it, decrease stress,  if any worsening symptoms, blood in stool, AB pain, etc call office  Elevated blood pressure reading without diagnosis of hypertension - continue medications, DASH diet, exercise and monitor at home. Call if greater than 130/80.  -     CBC with Differential/Platelet -     CMP/GFR -     TSH -     Urinalysis, Routine w reflex microscopic -     Microalbumin / creatinine urine ratio -     EKG 12-Lead  Other abnormal glucose Discussed disease progression and risks Discussed diet/exercise, weight management and risk modification  Mixed hyperlipidemia -continue medications, LDL goal <100, check lipids, decrease fatty foods, increase activity.  -     Lipid panel  Vitamin D deficiency -     VITAMIN D 25 Hydroxy (Vit-D Deficiency, Fractures)  Recurrent depression in partial remission /Anxiety state Continue medications  Lifestyle discussed: diet/exerise, sleep hygiene, stress management, hydration  Obesity Long discussion about weight loss, diet, and exercise Recommended diet heavy in fruits and veggies and low in animal meats, cheeses, and dairy products, appropriate calorie intake Discussed appropriate weight for height  Follow up at next visit  Medication management -     Magnesium  Estrogen def DEXA for osteoporosis screening ordered; patient given # to schedule   Need for pneumococcal vaccination 13 valent administered without complication   Discussed med's effects and SE's. Screening labs and tests as requested with regular follow-up as recommended. Future Appointments  Date Time Provider [place]  07/21/2019  2:00 PM Liane Comber, NP [facility] None   04/20/2020  2:00 PM Liane Comber, NP [facility] None     HPI  69 y.o. female  presents for a complete physical. She has Anxiety; Major depressive disorder in partial remission ([place]); IRRITABLE BOWEL SYNDROME; Allergic rhinitis; Vitamin D deficiency; Elevated blood pressure reading without diagnosis of hypertension; Other abnormal glucose (prediabetes); Mixed hyperlipidemia; Obesity (BMI 30.0-34.9); GERD (gastroesophageal reflux disease); and Plantar fasciitis of left foot on their problem list.   She is married, she has 1 child and 2 grandkids, watches occasionally.  She is retired but still helps with polling in [employer]   Her sister passed last year from brain tumor, was in hospice, was helping with her at end of life. Mother is in upper 32s, but lots of family hurt feelings, not talking to sister or mom.   She has hx of of depression/anxiety, recently initiated on lexapro 20 mg daily. She is considering therapy/counseling. She is currently prescribed xanax, uses mainly in the evening for sleep PRN to calm her mind down and rest if can't sleep with diphenhydramine.   She has plantar fascitis ongoing since Jan 2020, followed by [facility], with therapy and NSAID and reports much improved.   She has GERD and is currently taking omeprazole 20 mg and feels well controlled. Denies breakthrough sx.   She is following with Dr. Harrington Challenger for GYN every 2 years, saw 12/2018 and had mammogram and pelvic. Has hysterectomy in 1992.   BMI is Body mass index is 34.38 kg/m., she has been working on diet and exercise. No intentional exercise recently due to weather, but active around the home, chasing grandchildren; plans to start walking again with  dog once weather improves.  Still follows some keto diet principles and a supplement.   She drinks tea + splenda, water at night. She estimates 2-3 glasses. Rare very small coke. No ETOH.  Wt Readings from Last 3 Encounters:  04/21/19 213 lb  (96.6 kg)  01/11/19 213 lb (96.6 kg)  09/23/18 207 lb (93.9 kg)   Her blood pressure has been controlled at home, today their BP is BP: 118/72.  She does not workout. She denies chest pain, shortness of breath, dizziness.   She is on cholesterol medication (atorvastatin 40 mg, increased at last visit from 20 mg daily) and denies myalgias. Her cholesterol is not at goal of LDL <100. The cholesterol last visit was:  Lab Results  Component Value Date   CHOL 177 01/11/2019   HDL 36 (L) 01/11/2019   LDLCALC 110 (H) 01/11/2019   TRIG 191 (H) 01/11/2019   CHOLHDL 4.9 01/11/2019  . She hasn't been working on diet and exercise for hx of prediabetes (5.7% in 2015, 5.8% in 03/2018), she is on bASA, she is not on ACE/ARB and denies foot ulcerations, hyperglycemia, hypoglycemia , increased appetite, nausea, paresthesia of the feet, polydipsia, polyuria, visual disturbances, vomiting and weight loss. Last A1C in the office was:  Lab Results  Component Value Date   HGBA1C 5.5 09/23/2018   Patient is on Vitamin D supplement, taking 5000 IU daily    Lab Results  Component Value Date   VD25OH 46 09/23/2018     Last GFR:  Lab Results  Component Value Date   GFRNONAA 77 01/11/2019      Current Medications:  Current Outpatient Medications on File Prior to Visit  Medication Sig Dispense Refill  . aspirin 81 MG tablet Take 81 mg by mouth daily.     . CHOLECALCIFEROL PO Take 5,000 Units by mouth daily.    Marland Kitchen escitalopram (LEXAPRO) 20 MG tablet Take 1 tablet (20 mg total) by mouth daily. 90 tablet 0  . fluticasone (FLONASE) 50 MCG/ACT nasal spray PLACE 2 SPRAYS INTO EACH NOSTRIL ONCE DAILY 48 g 3  . ibuprofen (ADVIL) 800 MG tablet Take 1 tablet (800 mg total) by mouth every 8 (eight) hours as needed. 30 tablet 0  . MAGNESIUM CITRATE PO Take 400 mg by mouth daily.    . Omega-3 Fatty Acids (OMEGA-3 FISH OIL PO) Take by mouth daily.    Marland Kitchen omeprazole (PRILOSEC) 40 MG capsule Take 1 capsule Daily for  Indigestion & Heartburn 90 capsule 3  . POTASSIUM PO Take by mouth daily.    . vitamin C (ASCORBIC ACID) 250 MG tablet Take 250 mg by mouth daily.    . Zinc 50 MG TABS Take by mouth.     No current facility-administered medications on file prior to visit.    Health Maintenance:   Immunization History  Administered Date(s) Administered  . Influenza, High Dose Seasonal PF 04/10/2018  . Tdap 08/27/2012   Tetanus: 2014 Flu vaccine: 04/2018 Pneumonia:  Prevnar 13: DUE Zostavax: defer   LMP: Hysterectomy Pap: Hysterectomy 2012, sees GYN every other year, last pelvic 12/2018 MGM: 12/2018 at GYN DEXA: ordered, patient to schedule  Colonoscopy: 2011, due 2021 wants to do cologuard CT AB 08/01/2016 Stress test 2014  Last eye exam: remote, no issues, uses [employer] readers Last dental exam: Dr.Finn, last visit 2020, goes q45m  Patient Care Team: Unk Pinto, MD as PCP - General (Internal Medicine) Rozetta Nunnery, MD as Consulting Physician (Otolaryngology) Nahser, Wonda Cheng,  MD as Consulting Physician (Cardiology) Inda Castle, MD (Inactive) as Consulting Physician (Gastroenterology) Gus Height, MD (Inactive) as Consulting Physician (Obstetrics and Gynecology)  Medical History:  Past Medical History:  Diagnosis Date  . Anxiety and depression   . Depression   . Diverticulitis   . DIVERTICULITIS 04/19/2009   Qualifier: Diagnosis of  By: Deatra Ina MD, Sandy Salaam   . Hyperlipidemia   . Hypertension   . Irritable bowel syndrome    Allergies Allergies  Allergen Reactions  . Levofloxacin   . Prednisone     SURGICAL HISTORY She  has a past surgical history that includes Tonsillectomy; Vaginal hysterectomy; Colon surgery (2012); Cholecystectomy (N/A, 12/17/2013); and Knee arthroscopy (Right, 2019). FAMILY HISTORY Her family history includes Brain cancer (age of onset: 27) in her sister; Cancer in her paternal aunt; Heart attack (age of onset: 60) in her brother;  Heart attack (age of onset: 23) in her father; Heart disease in her brother, father, and mother; Hypertension in her mother; Irritable bowel syndrome in her sister; Osteoporosis in her mother; Throat cancer in her brother. SOCIAL HISTORY She  reports that she has never smoked. She has never used smokeless tobacco. She reports that she does not drink alcohol or use drugs.  Review of Systems: Review of Systems  Constitutional: Negative for chills, fever and malaise/fatigue.  HENT: Negative for congestion, ear pain and sore throat.   Eyes: Negative.   Respiratory: Negative for cough, shortness of breath and wheezing.   Cardiovascular: Negative for chest pain, palpitations and leg swelling.  Gastrointestinal: Negative for abdominal pain, blood in stool, constipation, diarrhea, heartburn and melena.  Genitourinary: Negative.   Skin: Negative.   Neurological: Negative for dizziness, sensory change, loss of consciousness and headaches.  Psychiatric/Behavioral: Positive for depression. Negative for substance abuse and suicidal ideas. The patient is not nervous/anxious and does not have insomnia.     Physical Exam: Estimated body mass index is 34.38 kg/m as calculated from the following:   Height as of this encounter: 5\' 6"  (1.676 m).   Weight as of this encounter: 213 lb (96.6 kg). BP 118/72   Pulse 82   Temp (!) 97.5 F (36.4 C)   Ht 5\' 6"  (1.676 m)   Wt 213 lb (96.6 kg)   SpO2 97%   BMI 34.38 kg/m   General Appearance: Well nourished well developed, in no apparent distress.  Eyes: PERRLA, EOMs, conjunctiva no swelling or erythema ENT/Mouth: Ear canals normal without obstruction, swelling, erythema, or discharge.  TMs normal bilaterally with no erythema, bulging, retraction, or loss of landmark.  Oropharynx moist and clear with no exudate, erythema, or swelling.   Neck: Supple, thyroid normal. No bruits.  No cervical adenopathy Respiratory: Respiratory effort normal, Breath sounds clear  A&P without wheeze, rhonchi, rales.   Cardio: RRR without murmurs, rubs or gallops. Brisk peripheral pulses without edema.  Chest: symmetric, with normal excursions Breasts: Defer to GYN Abdomen: Soft, nontender, no guarding, rebound, hernias, masses, or organomegaly.  Lymphatics: Non tender without lymphadenopathy.  Musculoskeletal: Full ROM all peripheral extremities,5/5 strength, and normal gait.  Skin: Warm, dry without rashes, lesions, ecchymosis. Neuro: Awake and oriented X 3, Cranial nerves intact, reflexes equal bilaterally. Normal muscle tone, no cerebellar symptoms. Sensation intact.  Psych:  normal affect, Insight and Judgment appropriate.   EKG: WNL no ST changes, nonspecific t wave inversion unchanged V3-V5.   Over 40 minutes of exam, counseling, chart review and critical decision making was performed  Teresa Vasquez 2:46  PM [facility]

## 2019-04-21 ENCOUNTER — Ambulatory Visit (INDEPENDENT_AMBULATORY_CARE_PROVIDER_SITE_OTHER): Payer: Medicare HMO | Admitting: Adult Health

## 2019-04-21 ENCOUNTER — Other Ambulatory Visit: Payer: Self-pay

## 2019-04-21 ENCOUNTER — Encounter: Payer: Self-pay | Admitting: Adult Health

## 2019-04-21 VITALS — BP 118/72 | HR 82 | Temp 97.5°F | Ht 66.0 in | Wt 213.0 lb

## 2019-04-21 DIAGNOSIS — R030 Elevated blood-pressure reading, without diagnosis of hypertension: Secondary | ICD-10-CM | POA: Diagnosis not present

## 2019-04-21 DIAGNOSIS — Z23 Encounter for immunization: Secondary | ICD-10-CM | POA: Diagnosis not present

## 2019-04-21 DIAGNOSIS — K589 Irritable bowel syndrome without diarrhea: Secondary | ICD-10-CM

## 2019-04-21 DIAGNOSIS — F419 Anxiety disorder, unspecified: Secondary | ICD-10-CM

## 2019-04-21 DIAGNOSIS — E559 Vitamin D deficiency, unspecified: Secondary | ICD-10-CM | POA: Diagnosis not present

## 2019-04-21 DIAGNOSIS — J301 Allergic rhinitis due to pollen: Secondary | ICD-10-CM

## 2019-04-21 DIAGNOSIS — Z136 Encounter for screening for cardiovascular disorders: Secondary | ICD-10-CM | POA: Diagnosis not present

## 2019-04-21 DIAGNOSIS — Z0000 Encounter for general adult medical examination without abnormal findings: Secondary | ICD-10-CM | POA: Diagnosis not present

## 2019-04-21 DIAGNOSIS — F3341 Major depressive disorder, recurrent, in partial remission: Secondary | ICD-10-CM

## 2019-04-21 DIAGNOSIS — K219 Gastro-esophageal reflux disease without esophagitis: Secondary | ICD-10-CM

## 2019-04-21 DIAGNOSIS — F411 Generalized anxiety disorder: Secondary | ICD-10-CM

## 2019-04-21 DIAGNOSIS — E669 Obesity, unspecified: Secondary | ICD-10-CM

## 2019-04-21 DIAGNOSIS — E782 Mixed hyperlipidemia: Secondary | ICD-10-CM | POA: Diagnosis not present

## 2019-04-21 DIAGNOSIS — R7309 Other abnormal glucose: Secondary | ICD-10-CM

## 2019-04-21 DIAGNOSIS — R69 Illness, unspecified: Secondary | ICD-10-CM | POA: Diagnosis not present

## 2019-04-21 DIAGNOSIS — Z131 Encounter for screening for diabetes mellitus: Secondary | ICD-10-CM | POA: Diagnosis not present

## 2019-04-21 DIAGNOSIS — E2839 Other primary ovarian failure: Secondary | ICD-10-CM

## 2019-04-21 MED ORDER — ALPRAZOLAM 1 MG PO TABS: ORAL_TABLET | ORAL | 0 refills | Status: DC

## 2019-04-21 MED ORDER — MONTELUKAST SODIUM 10 MG PO TABS: 10.0000 mg | ORAL_TABLET | Freq: Every day | ORAL | 1 refills | Status: DC

## 2019-04-21 MED ORDER — ATORVASTATIN CALCIUM 40 MG PO TABS: ORAL_TABLET | ORAL | 3 refills | Status: DC

## 2019-04-21 NOTE — Patient Instructions (Addendum)
Teresa Vasquez, Thank you for taking time to come for your Annual Wellness Visit. I appreciate your ongoing commitment to your health goals. Please review the following plan we discussed and let me know if I can assist you in the future.   These are the goals we discussed: Goals    . DIET - INCREASE WATER INTAKE     65+ fluid ounces daily     . Exercise 150 min/wk Moderate Activity    . Weight (lb) < 200 lb (90.7 kg)       This is a list of the screening recommended for you and due dates:  Health Maintenance  Topic Date Due  . DEXA scan (bone density measurement)  12/01/2017  . Pneumonia vaccines (1 of 2 - PCV13) 12/01/2017  . Colon Cancer Screening  05/06/2019  . Mammogram  12/16/2019  . Tetanus Vaccine  08/28/2022  .  Hepatitis C: One time screening is recommended by Center for Disease Control  (CDC) for  adults born from [birth year] through 1963.   Completed  . Flu Shot  Discontinued    Recommend getting an eye exam done this year   HOW TO [place]  7 a.m.-6:30 p.m., Monday 7 a.m.-5 p.m., Tuesday-Friday Schedule an appointment by calling [phone].      Carpal Tunnel Syndrome  Carpal tunnel syndrome is a condition that causes pain in your hand and arm. The carpal tunnel is a narrow area located on the palm side of your wrist. Repeated wrist motion or certain diseases may cause swelling within the tunnel. This swelling pinches the main nerve in the wrist (median nerve). What are the causes? This condition may be caused by:  Repeated wrist motions.  Wrist injuries.  Arthritis.  A cyst or tumor in the carpal tunnel.  Fluid buildup during pregnancy. Sometimes the cause of this condition is not known. What increases the risk? The following factors may make you more likely to develop this condition:  Having a job, such as being a Research scientist (life sciences), that requires you to repeatedly move your wrist in  the same motion.  Being a woman.  Having certain conditions, such as: ? Diabetes. ? Obesity. ? An underactive thyroid (hypothyroidism). ? Kidney failure. What are the signs or symptoms? Symptoms of this condition include:  A tingling feeling in your fingers, especially in your thumb, index, and middle fingers.  Tingling or numbness in your hand.  An aching feeling in your entire arm, especially when your wrist and elbow are bent for a long time.  Wrist pain that goes up your arm to your shoulder.  Pain that goes down into your palm or fingers.  A weak feeling in your hands. You may have trouble grabbing and holding items. Your symptoms may feel worse during the night. How is this diagnosed? This condition is diagnosed with a medical history and physical exam. You may also have tests, including:  Electromyogram (EMG). This test measures electrical signals sent by your nerves into the muscles.  Nerve conduction study. This test measures how well electrical signals pass through your nerves.  Imaging tests, such as X-rays, ultrasound, and MRI. These tests check for possible causes of your condition. How is this treated? This condition may be treated with:  Lifestyle changes. It is important to stop or change the activity that caused your condition.  Doing exercise and activities to strengthen your muscles and bones (physical  therapy).  Learning how to use your hand again after diagnosis (occupational therapy).  Medicines for pain and inflammation. This may include medicine that is injected into your wrist.  A wrist splint.  Surgery. Follow these instructions at home: If you have a splint:  Wear the splint as told by your health care provider. Remove it only as told by your health care provider.  Loosen the splint if your fingers tingle, become numb, or turn cold and blue.  Keep the splint clean.  If the splint is not waterproof: ? Do not let it get wet. ? Cover it  with a watertight covering when you take a bath or shower. Managing pain, stiffness, and swelling   If directed, put ice on the painful area: ? If you have a removable splint, remove it as told by your health care provider. ? Put ice in a plastic bag. ? Place a towel between your skin and the bag. ? Leave the ice on for 20 minutes, 2-3 times per day. General instructions  Take over-the-counter and prescription medicines only as told by your health care provider.  Rest your wrist from any activity that may be causing your pain. If your condition is work related, talk with your employer about changes that can be made, such as getting a wrist pad to use while typing.  Do any exercises as told by your health care provider, physical therapist, or occupational therapist.  Keep all follow-up visits as told by your health care provider. This is important. Contact a health care provider if:  You have new symptoms.  Your pain is not controlled with medicines.  Your symptoms get worse. Get help right away if:  You have severe numbness or tingling in your wrist or hand. Summary  Carpal tunnel syndrome is a condition that causes pain in your hand and arm.  It is usually caused by repeated wrist motions.  Lifestyle changes and medicines are used to treat carpal tunnel syndrome. Surgery may be recommended.  Follow your health care provider's instructions about wearing a splint, resting from activity, keeping follow-up visits, and calling for help. This information is not intended to replace advice given to you by your health care provider. Make sure you discuss any questions you have with your health care provider. Document Revised: 06/27/2017 Document Reviewed: 06/27/2017 Elsevier Patient Education  [employer].

## 2019-04-22 LAB — URINALYSIS, ROUTINE W REFLEX MICROSCOPIC
Bacteria, UA: NONE SEEN /HPF
Bilirubin Urine: NEGATIVE
Glucose, UA: NEGATIVE
Hgb urine dipstick: NEGATIVE
Hyaline Cast: NONE SEEN /LPF
Ketones, ur: NEGATIVE
Nitrite: NEGATIVE
Protein, ur: NEGATIVE
Specific Gravity, Urine: 1.018 (ref 1.001–1.03)
pH: 6.5 (ref 5.0–8.0)

## 2019-04-22 LAB — COMPLETE METABOLIC PANEL WITH GFR
AG Ratio: 1.9 (calc) (ref 1.0–2.5)
ALT: 7 U/L (ref 6–29)
AST: 16 U/L (ref 10–35)
Albumin: 4.3 g/dL (ref 3.6–5.1)
Alkaline phosphatase (APISO): 139 U/L (ref 37–153)
BUN: 11 mg/dL (ref 7–25)
CO2: 30 mmol/L (ref 20–32)
Calcium: 9.6 mg/dL (ref 8.6–10.4)
Chloride: 105 mmol/L (ref 98–110)
Creat: 0.81 mg/dL (ref 0.50–0.99)
GFR, Est African American: 88 mL/min/{1.73_m2} (ref >=60)
GFR, Est Non African American: 76 mL/min/{1.73_m2} (ref >=60)
Globulin: 2.3 g/dL (calc) (ref 1.9–3.7)
Glucose, Bld: 87 mg/dL (ref 65–99)
Potassium: 4.9 mmol/L (ref 3.5–5.3)
Sodium: 144 mmol/L (ref 135–146)
Total Bilirubin: 0.5 mg/dL (ref 0.2–1.2)
Total Protein: 6.6 g/dL (ref 6.1–8.1)

## 2019-04-22 LAB — HEMOGLOBIN A1C
Hgb A1c MFr Bld: 5.7 % of total Hgb — ABNORMAL HIGH (ref <5.7)
Mean Plasma Glucose: 117 (calc)
eAG (mmol/L): 6.5 (calc)

## 2019-04-22 LAB — CBC WITH DIFFERENTIAL/PLATELET
Absolute Monocytes: 465 cells/uL (ref 200–950)
Basophils Absolute: 40 cells/uL (ref 0–200)
Basophils Relative: 0.8 %
Eosinophils Absolute: 140 cells/uL (ref 15–500)
Eosinophils Relative: 2.8 %
HCT: 37.5 % (ref 35.0–45.0)
Hemoglobin: 12.2 g/dL (ref 11.7–15.5)
Lymphs Abs: 1175 cells/uL (ref 850–3900)
MCH: 28.6 pg (ref 27.0–33.0)
MCHC: 32.5 g/dL (ref 32.0–36.0)
MCV: 87.8 fL (ref 80.0–100.0)
MPV: 11.4 fL (ref 7.5–12.5)
Monocytes Relative: 9.3 %
Neutro Abs: 3180 cells/uL (ref 1500–7800)
Neutrophils Relative %: 63.6 %
Platelets: 201 10*3/uL (ref 140–400)
RBC: 4.27 10*6/uL (ref 3.80–5.10)
RDW: 13 % (ref 11.0–15.0)
Total Lymphocyte: 23.5 %
WBC: 5 10*3/uL (ref 3.8–10.8)

## 2019-04-22 LAB — LIPID PANEL
Cholesterol: 174 mg/dL (ref <200)
HDL: 38 mg/dL — ABNORMAL LOW (ref >=50)
LDL Cholesterol (Calc): 107 mg/dL (calc) — ABNORMAL HIGH
Non-HDL Cholesterol (Calc): 136 mg/dL (calc) — ABNORMAL HIGH (ref <130)
Total CHOL/HDL Ratio: 4.6 (calc) (ref <5.0)
Triglycerides: 175 mg/dL — ABNORMAL HIGH (ref <150)

## 2019-04-22 LAB — TSH: TSH: 2.84 mIU/L (ref 0.40–4.50)

## 2019-04-22 LAB — MAGNESIUM: Magnesium: 2.1 mg/dL (ref 1.5–2.5)

## 2019-04-22 LAB — MICROALBUMIN / CREATININE URINE RATIO
Creatinine, Urine: 124 mg/dL (ref 20–275)
Microalb Creat Ratio: 6 mcg/mg creat (ref <30)
Microalb, Ur: 0.7 mg/dL

## 2019-04-22 LAB — VITAMIN D 25 HYDROXY (VIT D DEFICIENCY, FRACTURES): Vit D, 25-Hydroxy: 37 ng/mL (ref 30–100)

## 2019-06-16 ENCOUNTER — Ambulatory Visit: Payer: Medicare HMO | Admitting: Adult Health

## 2019-06-21 ENCOUNTER — Other Ambulatory Visit: Payer: Self-pay

## 2019-06-21 ENCOUNTER — Ambulatory Visit (INDEPENDENT_AMBULATORY_CARE_PROVIDER_SITE_OTHER): Payer: Medicare HMO | Admitting: Adult Health

## 2019-06-21 ENCOUNTER — Encounter: Payer: Self-pay | Admitting: Adult Health

## 2019-06-21 VITALS — BP 130/82 | HR 104 | Temp 97.7°F | Ht 66.0 in | Wt 217.0 lb

## 2019-06-21 DIAGNOSIS — R69 Illness, unspecified: Secondary | ICD-10-CM | POA: Diagnosis not present

## 2019-06-21 DIAGNOSIS — F411 Generalized anxiety disorder: Secondary | ICD-10-CM | POA: Diagnosis not present

## 2019-06-21 DIAGNOSIS — J301 Allergic rhinitis due to pollen: Secondary | ICD-10-CM | POA: Diagnosis not present

## 2019-06-21 DIAGNOSIS — Z1211 Encounter for screening for malignant neoplasm of colon: Secondary | ICD-10-CM

## 2019-06-21 DIAGNOSIS — J302 Other seasonal allergic rhinitis: Secondary | ICD-10-CM

## 2019-06-21 MED ORDER — DEXAMETHASONE SODIUM PHOSPHATE 10 MG/ML IJ SOLN
10.0000 mg | Freq: Once | INTRAMUSCULAR | Status: AC
  Administered 2019-06-21: 15:00:00 10 mg via INTRAMUSCULAR

## 2019-06-21 MED ORDER — ALPRAZOLAM 1 MG PO TABS: ORAL_TABLET | ORAL | 0 refills | Status: DC

## 2019-06-21 MED ORDER — PROMETHAZINE-DM 6.25-15 MG/5ML PO SYRP: 5.0000 mL | ORAL_SOLUTION | Freq: Four times a day (QID) | ORAL | 1 refills | Status: DC | PRN

## 2019-06-21 NOTE — Patient Instructions (Signed)
Restart singulaire, consider adding claritin, etc as well for 2 weeks until feeling better  Increase flonase to twice daily in each nostril until symptoms are improving  Nasal saline sprays regularly, after going outdoors       Allergic Rhinitis, Adult Allergic rhinitis is an allergic reaction that affects the mucous membrane inside the nose. It causes sneezing, a runny or stuffy nose, and the feeling of mucus going down the back of the throat (postnasal drip). Allergic rhinitis can be mild to severe. There are two types of allergic rhinitis:  Seasonal. This type is also called hay fever. It happens only during certain seasons.  Perennial. This type can happen at any time of the year. What are the causes? This condition happens when the body's defense system (immune system) responds to certain harmless substances called allergens as though they were germs.  Seasonal allergic rhinitis is triggered by pollen, which can come from grasses, trees, and weeds. Perennial allergic rhinitis may be caused by:  House dust mites.  Pet dander.  Mold spores. What are the signs or symptoms? Symptoms of this condition include:  Sneezing.  Runny or stuffy nose (nasal congestion).  Postnasal drip.  Itchy nose.  Tearing of the eyes.  Trouble sleeping.  Daytime sleepiness. How is this diagnosed? This condition may be diagnosed based on:  Your medical history.  A physical exam.  Tests to check for related conditions, such as: ? Asthma. ? Pink eye. ? Ear infection. ? Upper respiratory infection.  Tests to find out which allergens trigger your symptoms. These may include skin or blood tests. How is this treated? There is no cure for this condition, but treatment can help control symptoms. Treatment may include:  Taking medicines that block allergy symptoms, such as antihistamines. Medicine may be given as a shot, nasal spray, or pill.  Avoiding the allergen.  Desensitization.  This treatment involves getting ongoing shots until your body becomes less sensitive to the allergen. This treatment may be done if other treatments do not help.  If taking medicine and avoiding the allergen does not work, new, stronger medicines may be prescribed. Follow these instructions at home:  Find out what you are allergic to. Common allergens include smoke, dust, and pollen.  Avoid the things you are allergic to. These are some things you can do to help avoid allergens: ? Replace carpet with wood, tile, or vinyl flooring. Carpet can trap dander and dust. ? Do not smoke. Do not allow smoking in your home. ? Change your heating and air conditioning filter at least once a month. ? During allergy season:  Keep windows closed as much as possible.  Plan outdoor activities when pollen counts are lowest. This is usually during the evening hours.  When coming indoors, change clothing and shower before sitting on furniture or bedding.  Take over-the-counter and prescription medicines only as told by your health care provider.  Keep all follow-up visits as told by your health care provider. This is important. Contact a health care provider if:  You have a fever.  You develop a persistent cough.  You make whistling sounds when you breathe (you wheeze).  Your symptoms interfere with your normal daily activities. Get help right away if:  You have shortness of breath. Summary  This condition can be managed by taking medicines as directed and avoiding allergens.  Contact your health care provider if you develop a persistent cough or fever.  During allergy season, keep windows closed as much as  possible. This information is not intended to replace advice given to you by your health care provider. Make sure you discuss any questions you have with your health care provider. Document Revised: 01/31/2017 Document Reviewed: 03/28/2016 Elsevier Patient Education  2020 [employer].

## 2019-06-21 NOTE — Progress Notes (Signed)
Assessment and Plan:  Teresa Vasquez was seen today for sinus problem.  Diagnoses and all orders for this visit:  Seasonal allergic rhinitis, unspecified trigger Benign exam, history suggestive of allergic etiology, onset after stopping singulaire Allergic hygiene reviewed, education provided Restart singulaire as this has been working well, consider adding claritin or other antihistamine for 2 weeks Discussed the importance of avoiding unnecessary antibiotic therapy. Suggested symptomatic OTC remedies. Nasal saline spray for congestion. Nasal steroids, allergy pill, oral steroids offered- requests IM steroid Follow up as needed, if new or progressive sx.  -     dexamethasone (DECADRON) injection 10 mg -     promethazine-dextromethorphan (PROMETHAZINE-DM) 6.25-15 MG/5ML syrup; Take 5 mLs by mouth 4 (four) times daily as needed for cough.  Screening for colon cancer -     Cologuard  Anxiety state -     ALPRAZolam (XANAX) 1 MG tablet; Take 1/2 to 1 tablet 2 x /day  ONLY if needed for Anxiety Attack and please try to limit to 5 days /week to avoid addiction   Further disposition pending results of labs. Discussed med's effects and SE's.   Over 15 minutes of exam, counseling, chart review, and critical decision making was performed.   Future Appointments  Date Time Provider [place]  08/26/2019  2:00 PM Liane Comber, NP [facility] None  12/27/2019  2:30 PM Unk Pinto, MD [facility] None  04/20/2020  2:00 PM Liane Comber, NP [facility] None    ------------------------------------------------------------------------------------------------------------------   HPI BP 130/82   Pulse (!) 104   Temp 97.7 F (36.5 C)   Ht 5\' 6"  (1.676 m)   Wt 217 lb (98.4 kg)   SpO2 98%   BMI 35.02 kg/m   68 y.o.female non-smoker with hx of allergic rhinitis and GERD presents for evaluation of persistent upper respiratory sx (nasal congestion, sinus pressure/headache) and  non-productive cough x 4-5 weeks.   She reports noted sinus pressure and frontal headache and dry cough (worse when lying supine) that began approx 1 month ago. She endorses post-nasal drip. Secretions are typically clear. She endorses mild sore throat. She reports symptoms are mild but progressively getting worse. She denies CP, wheezing. She denies fever/chills, rash, arthralgias.  She endorses sneezing, watery and itchy eyes.   She has been on singulaire for allergies but reports stopped this about 4-6 weeks ago as she felt she was doing well and didn't need it. Does admit to Spring and Fall allergies.   She has flonase, typically takes at night.   Past Medical History:  Diagnosis Date  . Anxiety and depression   . Depression   . Diverticulitis   . DIVERTICULITIS 04/19/2009   Qualifier: Diagnosis of  By: Deatra Ina MD, Sandy Salaam   . Hyperlipidemia   . Hypertension   . Irritable bowel syndrome      Allergies  Allergen Reactions  . Levofloxacin   . Prednisone     Current Outpatient Medications on File Prior to Visit  Medication Sig  . ALPRAZolam (XANAX) 1 MG tablet Take 1/2 to 1 tablet 2 x /day  ONLY if needed for Anxiety Attack and please try to limit to 5 days /week to avoid addiction  . aspirin 81 MG tablet Take 81 mg by mouth daily.   Teresa Vasquez Kitchen atorvastatin (LIPITOR) 40 MG tablet Take 1 tablet Daily for Cholesterol  . CHOLECALCIFEROL PO Take 5,000 Units by mouth daily.  Teresa Vasquez Kitchen escitalopram (LEXAPRO) 20 MG tablet Take 1 tablet (20 mg total) by mouth daily.  . fluticasone (  FLONASE) 50 MCG/ACT nasal spray PLACE 2 SPRAYS INTO EACH NOSTRIL ONCE DAILY  . ibuprofen (ADVIL) 800 MG tablet Take 1 tablet (800 mg total) by mouth every 8 (eight) hours as needed.  Teresa Vasquez Kitchen MAGNESIUM CITRATE PO Take 400 mg by mouth daily.  . montelukast (SINGULAIR) 10 MG tablet Take 1 tablet (10 mg total) by mouth daily.  . Omega-3 Fatty Acids (OMEGA-3 FISH OIL PO) Take by mouth daily.  Teresa Vasquez Kitchen omeprazole (PRILOSEC) 40 MG capsule Take 1  capsule Daily for Indigestion & Heartburn  . POTASSIUM PO Take by mouth daily.  . vitamin C (ASCORBIC ACID) 250 MG tablet Take 250 mg by mouth daily.  . Zinc 50 MG TABS Take by mouth.   No current facility-administered medications on file prior to visit.    ROS: all negative except above.   Physical Exam:  BP 130/82   Pulse (!) 104   Temp 97.7 F (36.5 C)   Ht 5\' 6"  (1.676 m)   Wt 217 lb (98.4 kg)   SpO2 98%   BMI 35.02 kg/m   General Appearance: Well nourished, in no apparent distress. Eyes: PERRLA, EOMs, conjunctiva no swelling or erythema Sinuses: No Frontal/maxillary tenderness ENT/Mouth: Ext aud canals clear, TMs without erythema, bulging. No erythema, swelling, or exudate on post pharynx.  Tonsils not swollen or erythematous. Hearing normal.  Neck: Supple, thyroid normal.  Respiratory: Respiratory effort normal, BS equal bilaterally without rales, rhonchi, wheezing or stridor.  Cardio: RRR with no MRGs. Brisk peripheral pulses without edema.  Abdomen: Soft, + BS.  Non tender, no guarding, rebound, hernias, masses. Lymphatics: Non tender without lymphadenopathy.  Musculoskeletal: Full ROM, 5/5 strength, normal gait.  Skin: Warm, dry without rashes, lesions, ecchymosis.  Neuro: Cranial nerves intact. Normal muscle tone, no cerebellar symptoms. Sensation intact.  Psych: Awake and oriented X 3, normal affect, Insight and Judgment appropriate.     [name], NP 2:43 PM [facility]

## 2019-06-29 ENCOUNTER — Ambulatory Visit: Payer: Medicare HMO | Admitting: Adult Health

## 2019-07-14 ENCOUNTER — Other Ambulatory Visit: Payer: Self-pay | Admitting: Adult Health

## 2019-07-18 ENCOUNTER — Other Ambulatory Visit: Payer: Self-pay | Admitting: Internal Medicine

## 2019-07-19 DIAGNOSIS — Z1211 Encounter for screening for malignant neoplasm of colon: Secondary | ICD-10-CM | POA: Diagnosis not present

## 2019-07-20 LAB — COLOGUARD: Cologuard: NEGATIVE

## 2019-07-21 ENCOUNTER — Ambulatory Visit: Payer: Medicare HMO | Admitting: Adult Health

## 2019-07-23 ENCOUNTER — Encounter: Payer: Self-pay | Admitting: Internal Medicine

## 2019-07-23 LAB — COLOGUARD: COLOGUARD: NEGATIVE

## 2019-08-15 ENCOUNTER — Other Ambulatory Visit: Payer: Self-pay | Admitting: Internal Medicine

## 2019-08-23 ENCOUNTER — Other Ambulatory Visit: Payer: Self-pay | Admitting: Adult Health Nurse Practitioner

## 2019-08-23 DIAGNOSIS — F411 Generalized anxiety disorder: Secondary | ICD-10-CM

## 2019-08-23 MED ORDER — ALPRAZOLAM 1 MG PO TABS: ORAL_TABLET | ORAL | 0 refills | Status: DC

## 2019-08-25 NOTE — Progress Notes (Signed)
MEDICARE ANNUAL WELLNESS VISIT AND FOLLOW UP  Assessment:   Annual Medicare Wellness visit  1 year; follow up GYN for mammograms/PAP/dexa  Allergic rhinitis due to pollen, unspecified seasonality Continue allergy medication  Irritable bowel syndrome, unspecified type If not on benefiber then add it, decrease stress,  if any worsening symptoms, blood in stool, AB pain, etc call office  Major depressive disorder, recurrent, active ([place]) Improved with lexapro, discussed benzo taper Lifestyle discussed: diet/exerise, sleep hygiene, stress management, hydration  Elevated blood pressure reading without diagnosis of hypertension - continue medications, DASH diet, exercise and monitor at home. Call if greater than 130/80.  -     CBC with Differential/Platelet -     CMP/GFR  Other abnormal glucose Discussed disease progression and risks Discussed diet/exercise, weight management and risk modification  Mixed hyperlipidemia -off of atorvastatin with leg cramps improved; consider pravastatin vs low dose rosuvastatin pending labs check lipids, decrease fatty foods, increase activity.  -     Lipid panel  Vitamin D deficiency Continue supplement   Anxiety state Monitor; discussed need to taper benzo to avoid daily use New guidelines suggest the benzodiazepines are best short term, with prolonged use they lead to physical and psychological dependence. In addition, evidence suggest that for insomnia the effectiveness wanes in 4 weeks and the risks out weight their benefits. Use of these agents have been associated with dementia, falls, motor vehicle accidents and physical addiction. Decreasing these medication have been proven to show improvements in cognition, alertness, decrease of falls and daytime sedation.  Insomnia- good sleep hygiene discussed, increase day time activity, try melatonin or benadryl if this does not help we will call in sleep medication - trazodone  Morbid obesity -  BMI 35 with comorbidities  Long discussion about weight loss, diet, and exercise Recommended diet heavy in fruits and veggies and low in animal meats, cheeses, and dairy products, appropriate calorie intake Discussed appropriate weight for height  Follow up at next visit  Medication management -     Magnesium  Left plantar fascitis Managed by foot center; continue brace, exercises, antiinflammatories PRN  Over 40 minutes of exam, counseling, chart review and critical decision making was performed Future Appointments  Date Time Provider [place]  12/27/2019  2:30 PM Unk Pinto, MD [facility] None  04/20/2020  2:00 PM Liane Comber, NP [facility] None     Plan:   During the course of the visit the patient was educated and counseled about appropriate screening and preventive services including:    Pneumococcal vaccine   Prevnar 13  Influenza vaccine  Td vaccine  Screening electrocardiogram  Bone densitometry screening  Colorectal cancer screening  Diabetes screening  Glaucoma screening  Nutrition counseling   Advanced directives: requested   Subjective:  Teresa Vasquez is a 69 y.o. female who presents for Medicare Annual Wellness Visit and 3 month follow up.   Her sister passed last year from brain tumor 01/2019, mom passed in April 2021. Family strife, not speaking with them any longer, conflict over trust.   She has hx of of depression/anxiety, recently back on lexapro 20 mg daily.  She is considering therapy/counseling - has She is currently prescribed xanax, uses mainly in the evening for sleep PRN to calm her mind down and rest, has been taking 0.5 -1.5 tabs nightly since mom passed, but does plan to start cutting back.   She has plantar fascitis ongoing since Jan 2020, followed by [facility] PRN, with therapy and  NSAID and reports much improved.    She has GERD and is currently taking omeprazole 40 mg and feels well controlled.  Denies breakthrough sx.   BMI is Body mass index is 34.86 kg/m., she has been working on diet but exercise has been limited but very active taking care of grandkids, taking dog out 4-5 times per day 5-10 min each Wt Readings from Last 3 Encounters:  08/26/19 216 lb (98 kg)  06/21/19 217 lb (98.4 kg)  04/21/19 213 lb (96.6 kg)    Her blood pressure has been controlled at home, today their BP is BP: 130/76  She does not workout. She denies chest pain, shortness of breath, dizziness.   She is on cholesterol medication (was on atorvastatin but was having leg cramps, resolved since stopping med) and denies myalgias. Her cholesterol is not at goal. The cholesterol last visit was:   Lab Results  Component Value Date   CHOL 174 04/21/2019   HDL 38 (L) 04/21/2019   LDLCALC 107 (H) 04/21/2019   TRIG 175 (H) 04/21/2019   CHOLHDL 4.6 04/21/2019    She has been working on diet for prediabetes, and denies increased appetite, nausea, paresthesia of the feet, polydipsia, polyuria and visual disturbances. Last A1C in the office was:  Lab Results  Component Value Date   HGBA1C 5.7 (H) 04/21/2019   Last GFR: Lab Results  Component Value Date   GFRNONAA 76 04/21/2019   Patient is on Vitamin D supplement, has increased to 5000 IU daily, working on consistency  Lab Results  Component Value Date   VD25OH 37 04/21/2019       Medication Review: Current Outpatient Medications on File Prior to Visit  Medication Sig Dispense Refill  . ALPRAZolam (XANAX) 1 MG tablet Take 1/2 to 1 tablet 2 x /day  ONLY if needed for Anxiety Attack. 90 tablet 0  . aspirin 81 MG tablet Take 81 mg by mouth daily.     . CHOLECALCIFEROL PO Take 5,000 Units by mouth daily.    Marland Kitchen escitalopram (LEXAPRO) 20 MG tablet TAKE ONE TABLET BY MOUTH DAILY 90 tablet 1  . fluticasone (FLONASE) 50 MCG/ACT nasal spray PLACE 2 SPRAYS INTO EACH NOSTRIL ONCE DAILY 48 mL 3  . ibuprofen (ADVIL) 800 MG tablet Take 1 tablet (800 mg total) by  mouth every 8 (eight) hours as needed. 30 tablet 0  . MAGNESIUM CITRATE PO Take 400 mg by mouth daily.    . montelukast (SINGULAIR) 10 MG tablet Take 1 tablet (10 mg total) by mouth daily. 90 tablet 1  . Omega-3 Fatty Acids (OMEGA-3 FISH OIL PO) Take by mouth daily.    Marland Kitchen omeprazole (PRILOSEC) 40 MG capsule Take 1 capsule Daily for Indigestion & Heartburn 90 capsule 3  . promethazine-dextromethorphan (PROMETHAZINE-DM) 6.25-15 MG/5ML syrup Take 5 mLs by mouth 4 (four) times daily as needed for cough. 240 mL 1  . vitamin C (ASCORBIC ACID) 250 MG tablet Take 250 mg by mouth daily.    . Zinc 50 MG TABS Take by mouth.     No current facility-administered medications on file prior to visit.    Allergies  Allergen Reactions  . Levofloxacin   . Prednisone     Current Problems (verified) Patient Active Problem List   Diagnosis Date Noted  . Plantar fasciitis of left foot 06/16/2018  . GERD (gastroesophageal reflux disease) 03/06/2018  . Morbid obesity ([place]) 08/27/2017  . Allergic rhinitis 03/14/2013  . Vitamin D deficiency 03/14/2013  . Elevated  blood pressure reading without diagnosis of hypertension 03/14/2013  . Other abnormal glucose (prediabetes) 03/14/2013  . Mixed hyperlipidemia 03/14/2013  . Anxiety 04/13/2009  . Major depressive disorder in partial remission ([place]) 04/13/2009  . IRRITABLE BOWEL SYNDROME 04/13/2009    Screening Tests Immunization History  Administered Date(s) Administered  . Influenza, High Dose Seasonal PF 04/10/2018  . PFIZER SARS-COV-2 Vaccination 07/06/2019, 07/27/2019  . Pneumococcal Conjugate-13 04/21/2019  . Tdap 08/27/2012   Tetanus: 2014 Flu vaccine: 04/2018 Pneumonia: due 1 year from prevnar Prevnar 13: 04/2019 Zostavax: declines at this time  Covid 19: 2/2, 2021, pfizer  LMP: Hysterectomy Pap: Hysterectomy 2012, sees GYN every other year, last pelvic 12/2018 MGM: 12/2018 at [place]: order in system, patient to schedule  Colonoscopy:  2011 Cologuard: 07/2019 negative  CT AB 08/01/2016 Stress test 2014  Last eye exam: remote, no issues, uses [employer] readers Last dental exam: Dr.Finn, last visit 2021, goes q66m   Patient Care Team: Unk Pinto, MD as PCP - General (Internal Medicine) Rozetta Nunnery, MD as Consulting Physician (Otolaryngology) Nahser, Wonda Cheng, MD as Consulting Physician (Cardiology) Inda Castle, MD (Inactive) as Consulting Physician (Gastroenterology) Gus Height, MD (Inactive) as Consulting Physician (Obstetrics and Gynecology)  SURGICAL HISTORY She  has a past surgical history that includes Tonsillectomy; Vaginal hysterectomy; Colon surgery (2012); Cholecystectomy (N/A, 12/17/2013); and Knee arthroscopy (Right, 2019). FAMILY HISTORY Her family history includes Brain cancer (age of onset: 37) in her sister; Cancer in her paternal aunt; Heart attack (age of onset: 96) in her brother; Heart attack (age of onset: 57) in her father; Heart disease in her brother, father, and mother; Hypertension in her mother; Irritable bowel syndrome in her sister; Osteoporosis in her mother; Throat cancer in her brother. SOCIAL HISTORY She  reports that she has never smoked. She has never used smokeless tobacco. She reports that she does not drink alcohol and does not use drugs.   MEDICARE WELLNESS OBJECTIVES: Physical activity: Current Exercise Habits: The patient does not participate in regular exercise at present, Exercise limited by: None identified Cardiac risk factors: Cardiac Risk Factors include: advanced age (>94men, >45 women);dyslipidemia;hypertension;obesity (BMI >30kg/m2);sedentary lifestyle Depression/mood screen:   Depression screen [facility] 2/9 08/26/2019  Decreased Interest 0  Down, Depressed, Hopeless 1  PHQ - 2 Score 1  Altered sleeping -  Tired, decreased energy -  Change in appetite -  Feeling bad or failure about yourself  -  Trouble concentrating -  Moving slowly or  fidgety/restless -  Suicidal thoughts -  PHQ-9 Score -  Difficult doing work/chores -    ADLs:  In your present state of health, do you have any difficulty performing the following activities: 08/26/2019 09/27/2018  Hearing? N N  Vision? N N  Difficulty concentrating or making decisions? N N  Walking or climbing stairs? N N  Dressing or bathing? N N  Doing errands, shopping? N N  Some recent data might be hidden     Cognitive Testing  Alert? Yes  Normal Appearance?Yes  Oriented to person? Yes  Place? Yes   Time? Yes  Recall of three objects?  Yes  Can perform simple calculations? Yes  Displays appropriate judgment?Yes  Can read the correct time from a watch face?Yes  EOL planning: Does Patient Have a Medical Advance Directive?: No Would patient like information on creating a medical advance directive?: Yes (MAU/Ambulatory/Procedural Areas - Information given)   Review of Systems  Constitutional: Negative for malaise/fatigue and weight loss.  HENT: Negative for  hearing loss and tinnitus.   Eyes: Negative for blurred vision and double vision.  Respiratory: Negative for cough, sputum production, shortness of breath and wheezing.   Cardiovascular: Negative for chest pain, palpitations, orthopnea, claudication, leg swelling and PND.  Gastrointestinal: Negative for abdominal pain, blood in stool, constipation, diarrhea, heartburn, melena, nausea and vomiting.  Genitourinary: Negative.   Musculoskeletal: Negative for falls, joint pain and myalgias.       Left foot pain improved   Skin: Negative for rash.  Neurological: Negative for dizziness, tingling, sensory change, weakness and headaches.  Endo/Heme/Allergies: Negative for polydipsia.  Psychiatric/Behavioral: Negative.  Negative for depression, memory loss, substance abuse and suicidal ideas. The patient is not nervous/anxious and does not have insomnia.   All other systems reviewed and are negative.    Objective:      Today's Vitals   08/26/19 1359 08/26/19 1423  BP: 130/76   Pulse: (!) 112 96  Temp: (!) 97.3 F (36.3 C)   SpO2: 98%   Weight: 216 lb (98 kg)    Body mass index is 34.86 kg/m.  General Appearance: Well nourished, in no apparent distress. Eyes: PERRLA, EOMs, conjunctiva no swelling or erythema Sinuses: No Frontal/maxillary tenderness ENT/Mouth: Ext aud canals clear, TMs without erythema, bulging. No erythema, swelling, or exudate on post pharynx.  Tonsils not swollen or erythematous. Hearing normal.  Neck: Supple, thyroid normal.  Respiratory: Respiratory effort normal, BS equal bilaterally without rales, rhonchi, wheezing or stridor.  Cardio: RRR with no MRGs. Brisk peripheral pulses without edema.  Abdomen: Soft, + BS.  Non tender, no guarding, rebound, hernias, masses. Lymphatics: Non tender without lymphadenopathy.  Musculoskeletal: Full ROM, 5/5 strength, Normal gait Skin: Warm, dry without rashes, lesions, ecchymosis.  Neuro: Cranial nerves intact. No cerebellar symptoms.  Psych: Awake and oriented X 3, normal affect, Insight and Judgment appropriate.    Medicare Attestation I have personally reviewed: The patient's medical and social history Their use of alcohol, tobacco or illicit drugs Their current medications and supplements The patient's functional ability including ADLs,fall risks, home safety risks, cognitive, and hearing and visual impairment Diet and physical activities Evidence for depression or mood disorders  The patient's weight, height, BMI, and visual acuity have been recorded in the chart.  I have made referrals, counseling, and provided education to the patient based on review of the above and I have provided the patient with a written personalized care plan for preventive services.     [name], NP   08/26/2019

## 2019-08-26 ENCOUNTER — Encounter: Payer: Self-pay | Admitting: Adult Health

## 2019-08-26 ENCOUNTER — Ambulatory Visit (INDEPENDENT_AMBULATORY_CARE_PROVIDER_SITE_OTHER): Payer: Medicare HMO | Admitting: Adult Health

## 2019-08-26 ENCOUNTER — Other Ambulatory Visit: Payer: Self-pay

## 2019-08-26 VITALS — BP 130/76 | HR 96 | Temp 97.3°F | Wt 216.0 lb

## 2019-08-26 DIAGNOSIS — R69 Illness, unspecified: Secondary | ICD-10-CM | POA: Diagnosis not present

## 2019-08-26 DIAGNOSIS — E559 Vitamin D deficiency, unspecified: Secondary | ICD-10-CM | POA: Diagnosis not present

## 2019-08-26 DIAGNOSIS — R030 Elevated blood-pressure reading, without diagnosis of hypertension: Secondary | ICD-10-CM

## 2019-08-26 DIAGNOSIS — Z0000 Encounter for general adult medical examination without abnormal findings: Secondary | ICD-10-CM

## 2019-08-26 DIAGNOSIS — R6889 Other general symptoms and signs: Secondary | ICD-10-CM

## 2019-08-26 DIAGNOSIS — J301 Allergic rhinitis due to pollen: Secondary | ICD-10-CM

## 2019-08-26 DIAGNOSIS — E782 Mixed hyperlipidemia: Secondary | ICD-10-CM | POA: Diagnosis not present

## 2019-08-26 DIAGNOSIS — K219 Gastro-esophageal reflux disease without esophagitis: Secondary | ICD-10-CM | POA: Diagnosis not present

## 2019-08-26 DIAGNOSIS — Z0001 Encounter for general adult medical examination with abnormal findings: Secondary | ICD-10-CM

## 2019-08-26 DIAGNOSIS — F419 Anxiety disorder, unspecified: Secondary | ICD-10-CM

## 2019-08-26 DIAGNOSIS — R7309 Other abnormal glucose: Secondary | ICD-10-CM

## 2019-08-26 DIAGNOSIS — K589 Irritable bowel syndrome without diarrhea: Secondary | ICD-10-CM | POA: Diagnosis not present

## 2019-08-26 DIAGNOSIS — F3341 Major depressive disorder, recurrent, in partial remission: Secondary | ICD-10-CM

## 2019-08-26 LAB — CBC WITH DIFFERENTIAL/PLATELET
Absolute Monocytes: 383 cells/uL (ref 200–950)
Basophils Absolute: 38 cells/uL (ref 0–200)
Basophils Relative: 0.7 %
Eosinophils Absolute: 108 cells/uL (ref 15–500)
Eosinophils Relative: 2 %
HCT: 36.9 % (ref 35.0–45.0)
Hemoglobin: 11.7 g/dL (ref 11.7–15.5)
Lymphs Abs: 1102 cells/uL (ref 850–3900)
MCH: 27.4 pg (ref 27.0–33.0)
MCHC: 31.7 g/dL — ABNORMAL LOW (ref 32.0–36.0)
MCV: 86.4 fL (ref 80.0–100.0)
MPV: 10.9 fL (ref 7.5–12.5)
Monocytes Relative: 7.1 %
Neutro Abs: 3769 cells/uL (ref 1500–7800)
Neutrophils Relative %: 69.8 %
Platelets: 225 10*3/uL (ref 140–400)
RBC: 4.27 10*6/uL (ref 3.80–5.10)
RDW: 13.3 % (ref 11.0–15.0)
Total Lymphocyte: 20.4 %
WBC: 5.4 10*3/uL (ref 3.8–10.8)

## 2019-08-26 LAB — COMPLETE METABOLIC PANEL WITH GFR
AG Ratio: 1.6 (calc) (ref 1.0–2.5)
ALT: 8 U/L (ref 6–29)
AST: 15 U/L (ref 10–35)
Albumin: 4.1 g/dL (ref 3.6–5.1)
Alkaline phosphatase (APISO): 125 U/L (ref 37–153)
BUN: 13 mg/dL (ref 7–25)
CO2: 31 mmol/L (ref 20–32)
Calcium: 9.6 mg/dL (ref 8.6–10.4)
Chloride: 105 mmol/L (ref 98–110)
Creat: 0.86 mg/dL (ref 0.50–0.99)
GFR, Est African American: 82 mL/min/{1.73_m2} (ref >=60)
GFR, Est Non African American: 70 mL/min/{1.73_m2} (ref >=60)
Globulin: 2.6 g/dL (calc) (ref 1.9–3.7)
Glucose, Bld: 120 mg/dL — ABNORMAL HIGH (ref 65–99)
Potassium: 4.2 mmol/L (ref 3.5–5.3)
Sodium: 140 mmol/L (ref 135–146)
Total Bilirubin: 0.3 mg/dL (ref 0.2–1.2)
Total Protein: 6.7 g/dL (ref 6.1–8.1)

## 2019-08-26 LAB — LIPID PANEL
Cholesterol: 254 mg/dL — ABNORMAL HIGH (ref <200)
HDL: 32 mg/dL — ABNORMAL LOW (ref >=50)
LDL Cholesterol (Calc): 160 mg/dL (calc) — ABNORMAL HIGH
Non-HDL Cholesterol (Calc): 222 mg/dL (calc) — ABNORMAL HIGH (ref <130)
Total CHOL/HDL Ratio: 7.9 (calc) — ABNORMAL HIGH (ref <5.0)
Triglycerides: 364 mg/dL — ABNORMAL HIGH (ref <150)

## 2019-08-26 LAB — MAGNESIUM: Magnesium: 2.1 mg/dL (ref 1.5–2.5)

## 2019-08-26 LAB — TSH: TSH: 3.29 mIU/L (ref 0.40–4.50)

## 2019-08-26 NOTE — Patient Instructions (Addendum)
Ms. Teresa Vasquez , Thank you for taking time to come for your Medicare Wellness Visit. I appreciate your ongoing commitment to your health goals. Please review the following plan we discussed and let me know if I can assist you in the future.   These are the goals we discussed: Goals    . DIET - INCREASE WATER INTAKE     65+ fluid ounces daily     . Exercise 150 min/wk Moderate Activity    . Weight (lb) < 200 lb (90.7 kg)       This is a list of the screening recommended for you and due dates:  Health Maintenance  Topic Date Due  . DEXA scan (bone density measurement)  Never done  . Mammogram  12/16/2019  . Pneumonia vaccines (2 of 2 - PPSV23) 04/20/2020  . Cologuard (Stool DNA test)  07/20/2022  . Tetanus Vaccine  08/28/2022  . COVID-19 Vaccine  Completed  .  Hepatitis C: One time screening is recommended by Center for Disease Control  (CDC) for  adults born from [birth year] through 1963.   Completed  . Flu Shot  Discontinued    NEW GUIDELINES FOR BENOZOS  New guidelines suggest the benzodiazepines are best short term, with prolonged use they lead to physical and psychological dependence. In addition, evidence suggest that for insomnia the effectiveness wanes in 4 weeks and the risks out weight their benefits. Use of these agents have been associated with dementia, falls, motor vehicle accidents and physical addiction. Decreasing these medication have been proven to show improvements in cognition, alertness, decrease of falls and daytime sedation.   Symptoms of withdrawal include, insomnia, anxiety, irritability, sweating and stomach or intestinal symptoms like diarrhea or nausea. Recommend a very slow taper.      Can do benadryl 25-50mg  at night for sleep.  If this does not help we can try prescription medication.  Also here is some information about good sleep hygiene.   Insomnia Insomnia is frequent trouble falling and/or staying asleep. Insomnia can be a long term problem or a  short term problem. Both are common. Insomnia can be a short term problem when the wakefulness is related to a certain stress or worry. Long term insomnia is often related to ongoing stress during waking hours and/or poor sleeping habits. Overtime, sleep deprivation itself can make the problem worse. Every little thing feels more severe because you are overtired and your ability to cope is decreased. CAUSES   Stress, anxiety, and depression.  Poor sleeping habits.  Distractions such as TV in the bedroom.  Naps close to bedtime.  Engaging in emotionally charged conversations before bed.  Technical reading before sleep.  Alcohol and other sedatives. They may make the problem worse. They can hurt normal sleep patterns and normal dream activity.  Stimulants such as caffeine for several hours prior to bedtime.  Pain syndromes and shortness of breath can cause insomnia.  Exercise late at night.  Changing time zones may cause sleeping problems (jet lag). It is sometimes helpful to have someone observe your sleeping patterns. They should look for periods of not breathing during the night (sleep apnea). They should also look to see how long those periods last. If you live alone or observers are uncertain, you can also be observed at a sleep clinic where your sleep patterns will be professionally monitored. Sleep apnea requires a checkup and treatment. Give your caregivers your medical history. Give your caregivers observations your family has made about your sleep.  SYMPTOMS   Not feeling rested in the morning.  Anxiety and restlessness at bedtime.  Difficulty falling and staying asleep. TREATMENT   Your caregiver may prescribe treatment for an underlying medical disorders. Your caregiver can give advice or help if you are using alcohol or other drugs for self-medication. Treatment of underlying problems will usually eliminate insomnia problems.  Medications can be prescribed for short  time use. They are generally not recommended for lengthy use.  Over-the-counter sleep medicines are not recommended for lengthy use. They can be habit forming.  You can promote easier sleeping by making lifestyle changes such as:  Using relaxation techniques that help with breathing and reduce muscle tension.  Exercising earlier in the day.  Changing your diet and the time of your last meal. No night time snacks.  Establish a regular time to go to bed.  Counseling can help with stressful problems and worry.  Soothing music and white noise may be helpful if there are background noises you cannot remove.  Stop tedious detailed work at least one hour before bedtime. HOME CARE INSTRUCTIONS   Keep a diary. Inform your caregiver about your progress. This includes any medication side effects. See your caregiver regularly. Take note of:  Times when you are asleep.  Times when you are awake during the night.  The quality of your sleep.  How you feel the next day. This information will help your caregiver care for you.  Get out of bed if you are still awake after 15 minutes. Read or do some quiet activity. Keep the lights down. Wait until you feel sleepy and go back to bed.  Keep regular sleeping and waking hours. Avoid naps.  Exercise regularly.  Avoid distractions at bedtime. Distractions include watching television or engaging in any intense or detailed activity like attempting to balance the household checkbook.  Develop a bedtime ritual. Keep a familiar routine of bathing, brushing your teeth, climbing into bed at the same time each night, listening to soothing music. Routines increase the success of falling to sleep faster.  Use relaxation techniques. This can be using breathing and muscle tension release routines. It can also include visualizing peaceful scenes. You can also help control troubling or intruding thoughts by keeping your mind occupied with boring or repetitive  thoughts like the old concept of counting sheep. You can make it more creative like imagining planting one beautiful flower after another in your backyard garden.  During your day, work to eliminate stress. When this is not possible use some of the previous suggestions to help reduce the anxiety that accompanies stressful situations. MAKE SURE YOU:   Understand these instructions.  Will watch your condition.  Will get help right away if you are not doing well or get worse. Document Released: 02/16/2000 Document Revised: 05/13/2011 Document Reviewed: 03/18/2007 [facility] Patient Information 2015 [place], [place]. This information is not intended to replace advice given to you by your health care provider. Make sure you discuss any questions you have with your health care provider.

## 2019-08-27 ENCOUNTER — Other Ambulatory Visit: Payer: Self-pay | Admitting: Adult Health

## 2019-08-27 MED ORDER — ROSUVASTATIN CALCIUM 5 MG PO TABS: ORAL_TABLET | ORAL | 1 refills | Status: DC

## 2019-09-12 IMAGING — MR MR KNEE*R* W/O CM
4 of 6 series · 19 of 40 positions shown · non-contrast
Comparison: MRI right knee 04/28/2016.

CLINICAL DATA: History of right knee surgery in June 2016. The
patient slipped off of a porch 1 week ago and felt a pop in the
right knee with onset of severe pain.

EXAM:
MRI OF THE RIGHT KNEE WITHOUT CONTRAST
TECHNIQUE: Multiplanar, multisequence MR imaging of the knee was performed. No
intravenous contrast was administered.

[Series 3: PD fat-sat · axial · 4.0mm · 0.31mm/px · z∈[-10,+95]mm · 8 of 25 slices shown (1 of 4)]
[im 1/25]
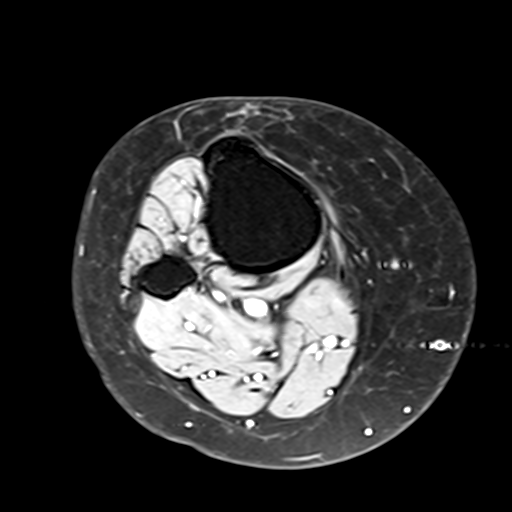
[im 4/25]
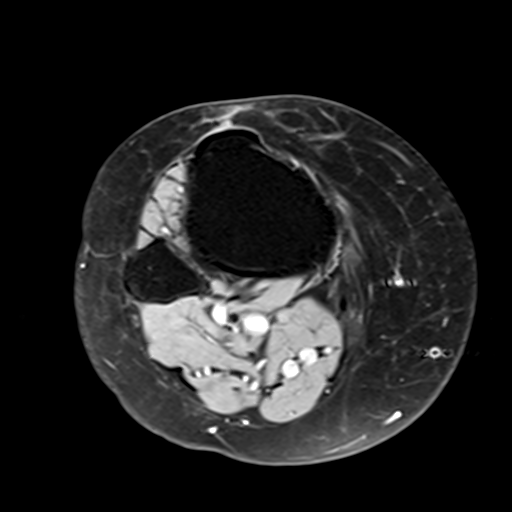
[im 7/25]
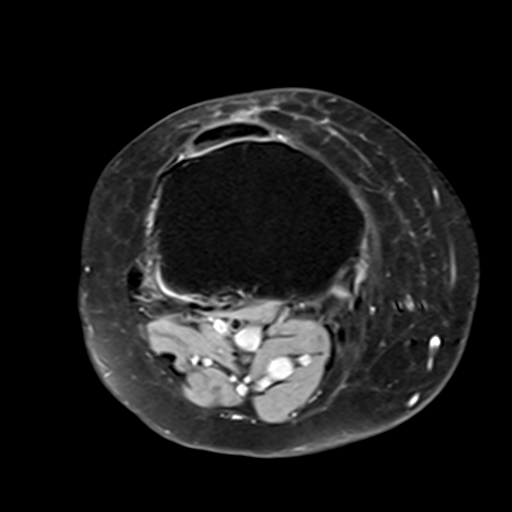
[im 11/25]
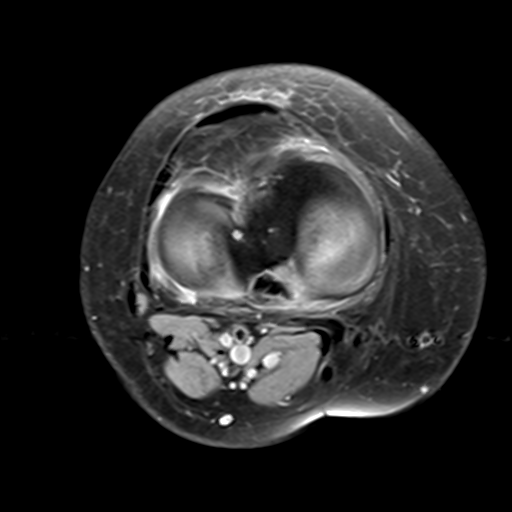
[im 14/25]
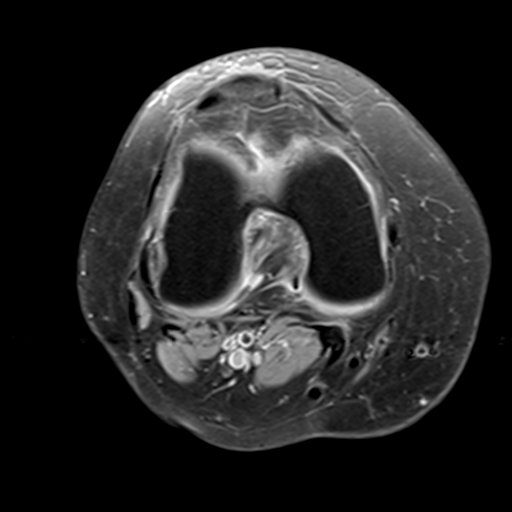
[im 18/25]
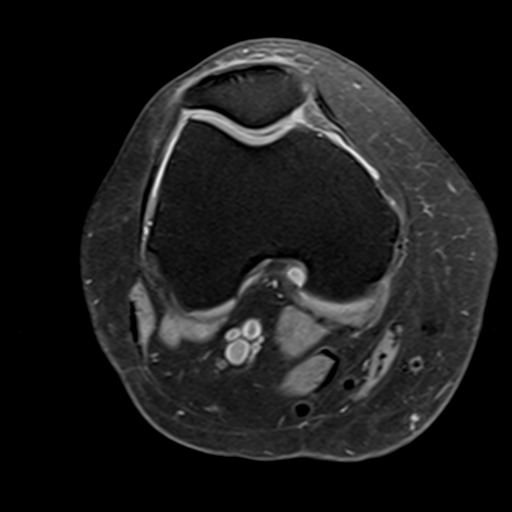
[im 21/25]
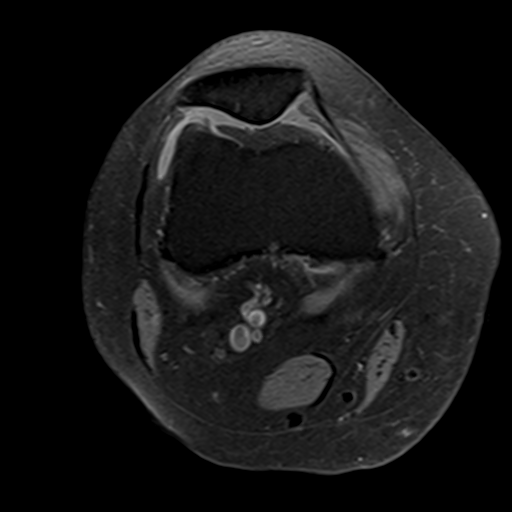
[im 25/25]
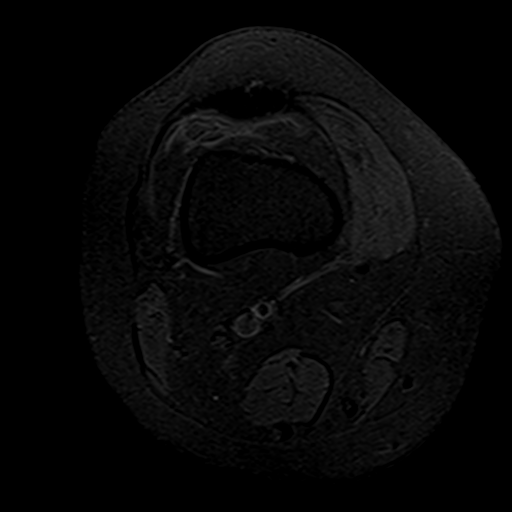

[Series 6: PD fat-sat · sagittal · 3.5mm · 0.31mm/px · 5 of 25 slices shown (2 of 4)]
[im 1/25]
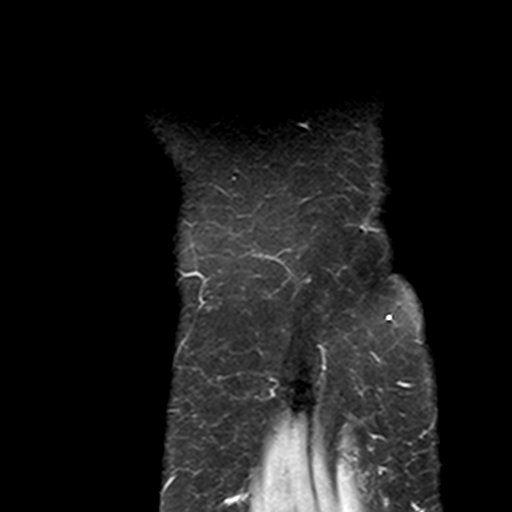
[im 4/25]
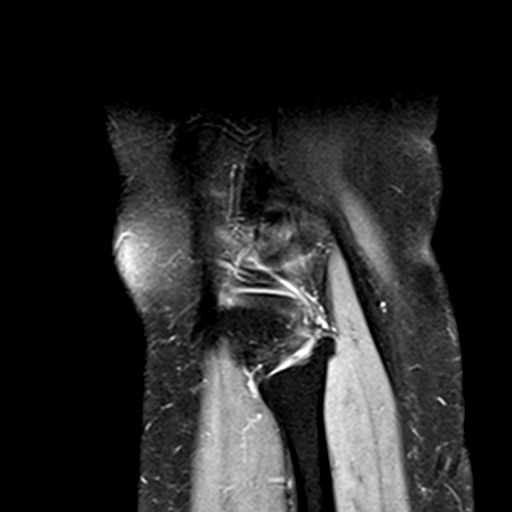
[im 7/25]
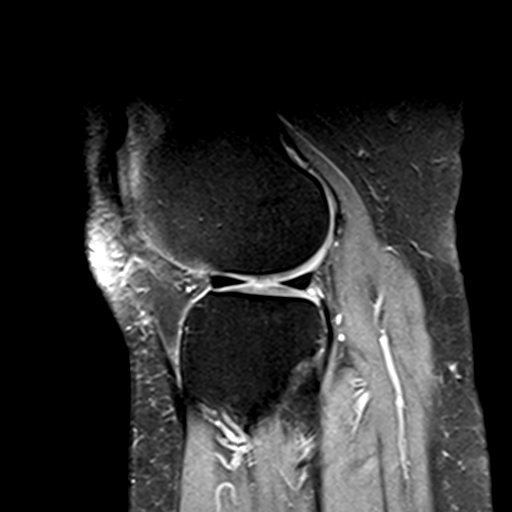
[im 14/25]
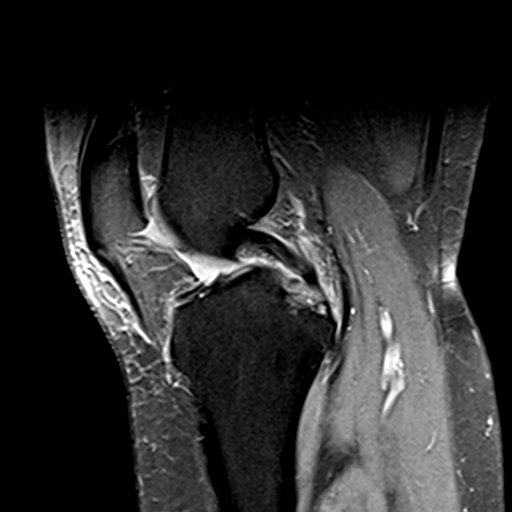
[im 21/25]
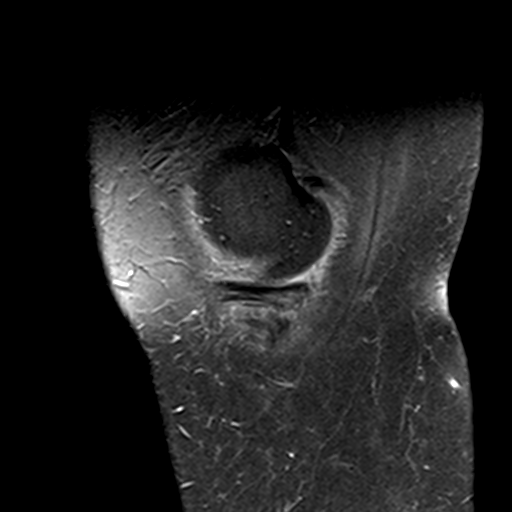

[Series 7: PD fat-sat · coronal · 3.5mm · 0.31mm/px · 3 of 23 slices shown (3 of 4)]
[im 4/23]
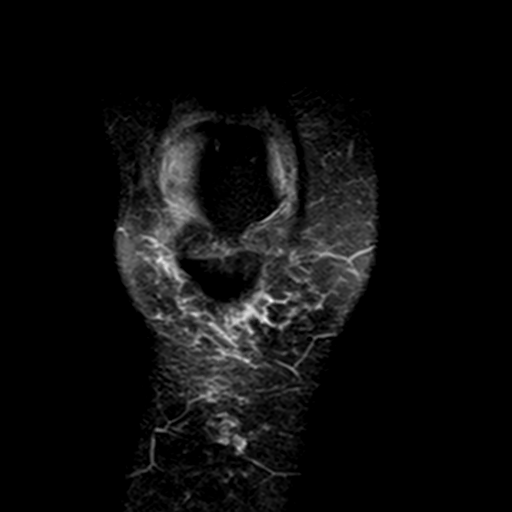
[im 12/23]
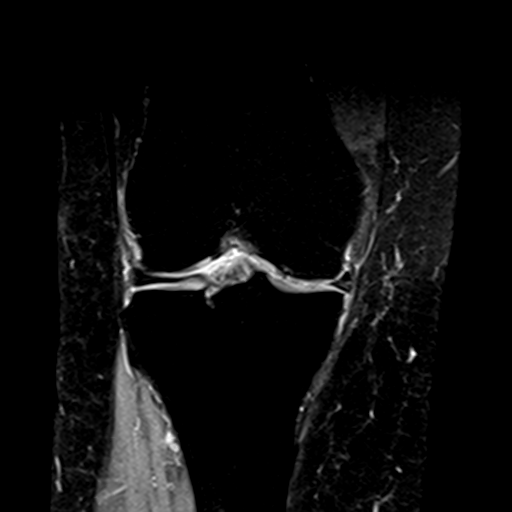
[im 19/23]
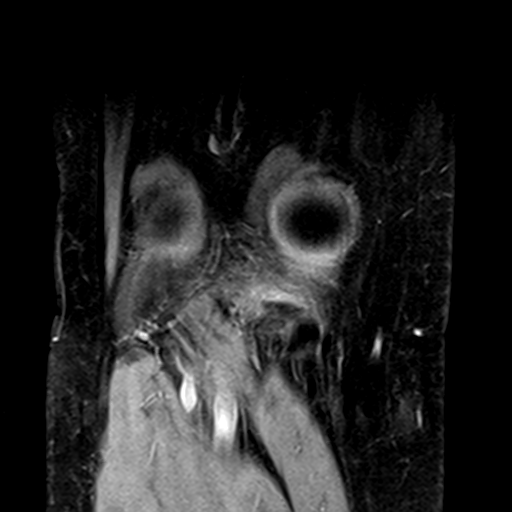

[Series 8: PD fat-sat · coronal · 2.0mm · 0.29mm/px · 3 of 11 slices shown (4 of 4)]
[im 1/11]
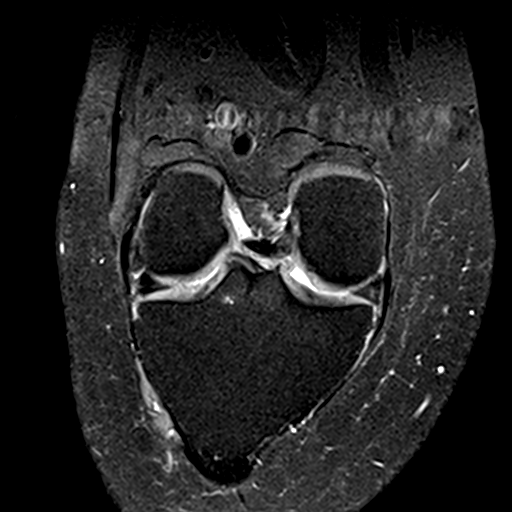
[im 6/11]
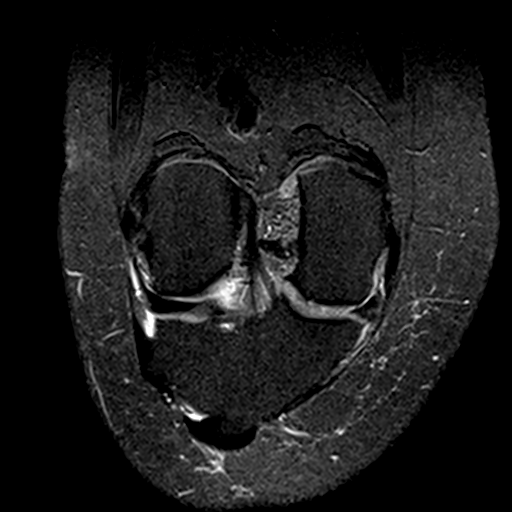
[im 11/11]
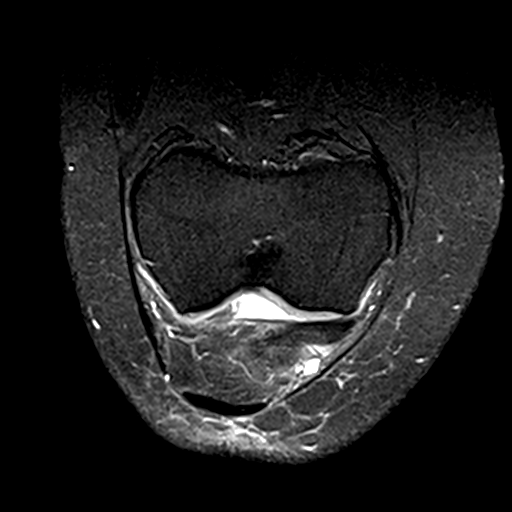

[19 of 40 positions shown; findings below may reference images not displayed]

FINDINGS: MENISCI

Medial meniscus: Since the prior MRI, the patient has suffered a
radial tear through the root of the posterior horn of the medial
meniscus. Extensive degenerative signal is present throughout the
remainder of the posterior horn.

Lateral meniscus:  Intact.

LIGAMENTS

Cruciates:  Intact.

Collaterals:  Intact.

CARTILAGE

Patellofemoral:  Mild thinning about the patella is unchanged.

Medial: Thinning along the weight-bearing medial femoral condyle is
unchanged.

Lateral:  Mildly degenerated, unchanged.

Joint:  Very small effusion.

Popliteal Fossa:  No Baker's cyst.

Extensor Mechanism:  Intact.

Bones:  No fracture or worrisome lesion.

Other: None.
IMPRESSION: The patient has suffered a radial tear through the root of the
posterior horn of the medial meniscus since the prior examination.

No change in mild osteoarthritis about the knee.

## 2019-10-18 ENCOUNTER — Other Ambulatory Visit: Payer: Self-pay | Admitting: Internal Medicine

## 2019-10-18 ENCOUNTER — Other Ambulatory Visit: Payer: Self-pay | Admitting: Adult Health Nurse Practitioner

## 2019-10-18 DIAGNOSIS — F411 Generalized anxiety disorder: Secondary | ICD-10-CM

## 2019-11-09 ENCOUNTER — Ambulatory Visit (INDEPENDENT_AMBULATORY_CARE_PROVIDER_SITE_OTHER): Payer: Medicare HMO

## 2019-11-09 ENCOUNTER — Other Ambulatory Visit: Payer: Self-pay

## 2019-11-09 VITALS — BP 128/76 | HR 60 | Temp 97.7°F | Wt 217.0 lb

## 2019-11-09 DIAGNOSIS — R8271 Bacteriuria: Secondary | ICD-10-CM

## 2019-11-09 NOTE — Addendum Note (Signed)
Addended by: Chancy Hurter on: 11/09/2019 05:42 PM   Modules accepted: Orders

## 2019-11-10 ENCOUNTER — Other Ambulatory Visit: Payer: Self-pay | Admitting: Internal Medicine

## 2019-11-10 DIAGNOSIS — R300 Dysuria: Secondary | ICD-10-CM

## 2019-11-10 MED ORDER — CIPROFLOXACIN HCL 250 MG PO TABS: ORAL_TABLET | ORAL | 0 refills | Status: DC

## 2019-11-11 ENCOUNTER — Other Ambulatory Visit: Payer: Self-pay | Admitting: Internal Medicine

## 2019-11-11 DIAGNOSIS — B9620 Unspecified Escherichia coli [E. coli] as the cause of diseases classified elsewhere: Secondary | ICD-10-CM

## 2019-11-11 LAB — URINALYSIS, ROUTINE W REFLEX MICROSCOPIC
Bacteria, UA: NONE SEEN /HPF
Bilirubin Urine: NEGATIVE
Glucose, UA: NEGATIVE
Hyaline Cast: NONE SEEN /LPF
Ketones, ur: NEGATIVE
Nitrite: NEGATIVE
Specific Gravity, Urine: 1.012 (ref 1.001–1.03)
pH: 5.5 (ref 5.0–8.0)

## 2019-11-11 LAB — URINE CULTURE
MICRO NUMBER:: 10918049
SPECIMEN QUALITY:: ADEQUATE

## 2019-11-11 NOTE — Progress Notes (Signed)
===================================================  -   U/C returned growing an E.coli bacteri which  is very sensitive to the Cipro antibiotic sent in .  - So please finish the Cipro and schedule a Nurse visit in 3-4 weeks to recheck urine to be sure infection is cleared up

## 2019-11-12 NOTE — Progress Notes (Signed)
Patient is aware of lab results and 3 week NV to re-check urine has been scheduled. -e welch

## 2019-11-30 ENCOUNTER — Other Ambulatory Visit: Payer: Self-pay

## 2019-11-30 ENCOUNTER — Ambulatory Visit (INDEPENDENT_AMBULATORY_CARE_PROVIDER_SITE_OTHER): Payer: Medicare HMO

## 2019-11-30 DIAGNOSIS — N390 Urinary tract infection, site not specified: Secondary | ICD-10-CM

## 2019-11-30 DIAGNOSIS — B9620 Unspecified Escherichia coli [E. coli] as the cause of diseases classified elsewhere: Secondary | ICD-10-CM | POA: Diagnosis not present

## 2019-11-30 NOTE — Progress Notes (Signed)
Patient presents to the office for a nurse visit to provide a urine specimen. Previously treated for a UTI and has completed her antibiotics. No signs or symptoms at this time. Vitals taken and recorded.

## 2019-12-01 LAB — URINALYSIS, ROUTINE W REFLEX MICROSCOPIC
Bilirubin Urine: NEGATIVE
Glucose, UA: NEGATIVE
Hgb urine dipstick: NEGATIVE
Ketones, ur: NEGATIVE
Leukocytes,Ua: NEGATIVE
Nitrite: NEGATIVE
Protein, ur: NEGATIVE
Specific Gravity, Urine: 1.01 (ref 1.001–1.03)
pH: 6 (ref 5.0–8.0)

## 2019-12-01 LAB — URINE CULTURE
MICRO NUMBER:: 11005351
Result:: NO GROWTH
SPECIMEN QUALITY:: ADEQUATE

## 2019-12-01 NOTE — Progress Notes (Signed)
==========================================================  -    Urine culture returned OK - No Infection. So No antibiotics needed  ==========================================================

## 2019-12-06 ENCOUNTER — Other Ambulatory Visit: Payer: Self-pay | Admitting: Internal Medicine

## 2019-12-07 ENCOUNTER — Other Ambulatory Visit: Payer: Self-pay | Admitting: *Deleted

## 2019-12-07 MED ORDER — OMEPRAZOLE 40 MG PO CPDR: DELAYED_RELEASE_CAPSULE | ORAL | 0 refills | Status: DC

## 2019-12-08 ENCOUNTER — Other Ambulatory Visit: Payer: Self-pay | Admitting: Internal Medicine

## 2019-12-09 DIAGNOSIS — J019 Acute sinusitis, unspecified: Secondary | ICD-10-CM | POA: Diagnosis not present

## 2019-12-13 ENCOUNTER — Other Ambulatory Visit: Payer: Self-pay | Admitting: Adult Health

## 2019-12-13 DIAGNOSIS — F411 Generalized anxiety disorder: Secondary | ICD-10-CM

## 2019-12-26 NOTE — Progress Notes (Signed)
History of Present Illness:       This very nice 69 y.o.  MWF presents for 4 month follow up with HTN, HLD, Pre-Diabetes and Vitamin D Deficiency.       Patient is followed expectantly for labile HTN & BP has been controlled at home. Today's BP is at goal -  116/80. Patient has had no complaints of any cardiac type chest pain, palpitations, dyspnea / orthopnea / PND, dizziness, claudication, or dependent edema.  BP Readings from Last 3 Encounters:  11/30/19 126/82  11/09/19 128/76  08/26/19 130/76       Patient is Statin Intolerant and her Hyperlipidemia is not controlled with diet & meds. Patient denies myalgias or other med SE's. Last Lipids were not at goal:  Lab Results  Component Value Date   CHOL 254 (H) 08/26/2019   HDL 32 (L) 08/26/2019   LDLCALC 160 (H) 08/26/2019   TRIG 364 (H) 08/26/2019   CHOLHDL 7.9 (H) 08/26/2019    Also, the patient has Moderate Obesity  (BMI 34.8+) and consequent  PreDiabetes and has had no symptoms of reactive hypoglycemia, diabetic polys, paresthesias or visual blurring.  Last A1c was not at goal:  Lab Results  Component Value Date   HGBA1C 5.7 (H) 04/21/2019   Wt Readings from Last 3 Encounters:  11/30/19 218 lb (98.9 kg)  11/09/19 217 lb (98.4 kg)  08/26/19 216 lb (98 kg)           Further, the patient also has history of Vitamin D Deficiency and  Does supplements vitamin D as recommended . Last vitamin D was still very low:  Lab Results  Component Value Date   VD25OH 37 04/21/2019    Current Outpatient Medications on File Prior to Visit  Medication Sig  . ALPRAZolam  1 MG tablet TAKE 1/2 TO 1 TABLET TWICE A DAY ONLY IF NEEDED FOR ANXIETY ATTACK.  Marland Kitchen aspirin 81 MG tablet Take  daily.   . CHOLECALCIFEROL PO Take 5,000 Units daily.  Marland Kitchen escitalopram  20 MG tablet TAKE ONE TABLET  DAILY  . FLONASE nasal spray 2 SPRAYS INTO EACH NOSTRIL DAILY  . ibuprofen  800 MG tablet Take 1 tablet  every 8  hours as needed.  Marland Kitchen MAGNESIUM  CITRATE  400 mg  Take daily.  . montelukast 10 MG tablet Take 1 tablet daily.  . Omega-3 FISH OIL Take  daily.  Marland Kitchen omeprazole  40 MG capsule TAKE ONE CAPSULE ONCE DAILY   . rosuvastatin 5 MG tablet Start by taking 1 tab three nights a week (MWF) for cholesterol; slowly increase as tolerated up to taking daily.  . vitamin C  250 MG tablet Take daily.  . Zinc 50 MG TABS Take daily     Allergies  Allergen Reactions  . Atorvastatin Other (See Comments)    Leg cramps  . Levofloxacin   . Prednisone     PMHx:   Past Medical History:  Diagnosis Date  . Anxiety and depression   . Depression   . Diverticulitis   . DIVERTICULITIS 04/19/2009   Qualifier: Diagnosis of  By: Deatra Ina MD, Sandy Salaam   . Hyperlipidemia   . Hypertension   . Irritable bowel syndrome     Immunization History  Administered Date(s) Administered  . Influenza, High Dose Seasonal PF 04/10/2018  . PFIZER SARS-COV-2 Vaccination 07/06/2019, 07/27/2019  . Pneumococcal Conjugate-13 04/21/2019  . Tdap 08/27/2012    Past Surgical History:  Procedure Laterality  Date  . CHOLECYSTECTOMY N/A 12/17/2013   Procedure: LAPAROSCOPIC CHOLECYSTECTOMY WITH INTRAOPERATIVE CHOLANGIOGRAM;  Surgeon: Fanny Skates, MD;  Location: [facility];  Service: General;  Laterality: N/A;  . COLON SURGERY  2012   Diverticulosis  . KNEE ARTHROSCOPY Right 2019   x 2  . TONSILLECTOMY    . VAGINAL HYSTERECTOMY      FHx:    Reviewed / unchanged  SHx:    Reviewed / unchanged   Systems Review:  Constitutional: Denies fever, chills, wt changes, headaches, insomnia, fatigue, night sweats, change in appetite. Eyes: Denies redness, blurred vision, diplopia, discharge, itchy, watery eyes.  ENT: Denies discharge, congestion, post nasal drip, epistaxis, sore throat, earache, hearing loss, dental pain, tinnitus, vertigo, sinus pain, snoring.  CV: Denies chest pain, palpitations, irregular heartbeat, syncope, dyspnea, diaphoresis, orthopnea, PND, claudication  or edema. Respiratory: denies cough, dyspnea, DOE, pleurisy, hoarseness, laryngitis, wheezing.  Gastrointestinal: Denies dysphagia, odynophagia, heartburn, reflux, water brash, abdominal pain or cramps, nausea, vomiting, bloating, diarrhea, constipation, hematemesis, melena, hematochezia  or hemorrhoids. Genitourinary: Denies dysuria, frequency, urgency, nocturia, hesitancy, discharge, hematuria or flank pain. Musculoskeletal: Denies arthralgias, myalgias, stiffness, jt. swelling, pain, limping or strain/sprain.  Skin: Denies pruritus, rash, hives, warts, acne, eczema or change in skin lesion(s). Neuro: No weakness, tremor, incoordination, spasms, paresthesia or pain. Psychiatric: Denies confusion, memory loss or sensory loss. Endo: Denies change in weight, skin or hair change.  Heme/Lymph: No excessive bleeding, bruising or enlarged lymph nodes.  Physical Exam  BP 116/80   Pulse 95   Temp (!) 97 F (36.1 C)   Resp 16   Ht 5' 6.5" (1.689 m)   Wt 222 lb 6.4 oz (100.9 kg)   SpO2 99%   BMI 35.36 kg/m   Appears  well nourished, well groomed  and in no distress.  Eyes: PERRLA, EOMs, conjunctiva no swelling or erythema. Sinuses: No frontal/maxillary tenderness ENT/Mouth: EAC's clear, TM's nl w/o erythema, bulging. Nares clear w/o erythema, swelling, exudates. Oropharynx clear without erythema or exudates. Oral hygiene is good. Tongue normal, non obstructing. Hearing intact.  Neck: Supple. Thyroid not palpable. Car 2+/2+ without bruits, nodes or JVD. Chest: Respirations nl with BS clear & equal w/o rales, rhonchi, wheezing or stridor.  Cor: Heart sounds normal w/ regular rate and rhythm without sig. murmurs, gallops, clicks or rubs. Peripheral pulses normal and equal  without edema.  Abdomen: Soft & bowel sounds normal. Non-tender w/o guarding, rebound, hernias, masses or organomegaly.  Lymphatics: Unremarkable.  Musculoskeletal: Full ROM all peripheral extremities, joint stability, 5/5  strength and normal gait.  Skin: Warm, dry without exposed rashes, lesions or ecchymosis apparent.  Neuro: Cranial nerves intact, reflexes equal bilaterally. Sensory-motor testing grossly intact. Tendon reflexes grossly intact.  Pysch: Alert & oriented x 3.  Insight and judgement nl & appropriate. No ideations.  Assessment and Plan:  1. Labile hypertension  - Continue medication, monitor blood pressure at home.  - Continue DASH diet.  Reminder to go to the ER if any CP,  SOB, nausea, dizziness, severe HA, changes vision/speech.  - CBC with Differential/Platelet - COMPLETE METABOLIC PANEL WITH GFR - Magnesium - TSH  2. Hyperlipidemia, mixed  - Continue diet/meds, exercise,& lifestyle modifications.  - Continue monitor periodic cholesterol/liver & renal functions   - Lipid panel - TSH  3. Abnormal glucose  - Continue diet, exercise  - Lifestyle modifications.  - Monitor appropriate labs.  - Hemoglobin A1c - Insulin, random  4. Vitamin D deficiency  - Continue supplementation.  - VITAMIN  D 25 Hydroxy  5. Medication management  - CBC with Differential/Platelet - COMPLETE METABOLIC PANEL WITH GFR - Magnesium - Lipid panel - TSH - Hemoglobin A1c - Insulin, random - VITAMIN D 25 Hydroxy         Discussed  regular exercise, BP monitoring, weight control to achieve/maintain BMI less than 25 and discussed med and SE's. Recommended labs to assess and monitor clinical status with further disposition pending results of labs.  I discussed the assessment and treatment plan with the patient. The patient was provided an opportunity to ask questions and all were answered. The patient agreed with the plan and demonstrated an understanding of the instructions.  I provided over 30 minutes of exam, counseling, chart review and  complex critical decision making.         The patient was instructed in use of Phentermine & Topiramate as an adjunct for weight loss for her severe  obesity.  Kirtland Bouchard, MD

## 2019-12-27 ENCOUNTER — Ambulatory Visit (INDEPENDENT_AMBULATORY_CARE_PROVIDER_SITE_OTHER): Payer: Medicare HMO | Admitting: Internal Medicine

## 2019-12-27 ENCOUNTER — Encounter: Payer: Self-pay | Admitting: Internal Medicine

## 2019-12-27 ENCOUNTER — Other Ambulatory Visit: Payer: Self-pay

## 2019-12-27 VITALS — BP 116/80 | HR 95 | Temp 97.0°F | Resp 16 | Ht 66.5 in | Wt 222.4 lb

## 2019-12-27 DIAGNOSIS — E669 Obesity, unspecified: Secondary | ICD-10-CM | POA: Diagnosis not present

## 2019-12-27 DIAGNOSIS — R0989 Other specified symptoms and signs involving the circulatory and respiratory systems: Secondary | ICD-10-CM

## 2019-12-27 DIAGNOSIS — R69 Illness, unspecified: Secondary | ICD-10-CM | POA: Diagnosis not present

## 2019-12-27 DIAGNOSIS — Z79899 Other long term (current) drug therapy: Secondary | ICD-10-CM | POA: Diagnosis not present

## 2019-12-27 DIAGNOSIS — E782 Mixed hyperlipidemia: Secondary | ICD-10-CM | POA: Diagnosis not present

## 2019-12-27 DIAGNOSIS — R7309 Other abnormal glucose: Secondary | ICD-10-CM

## 2019-12-27 DIAGNOSIS — E559 Vitamin D deficiency, unspecified: Secondary | ICD-10-CM | POA: Diagnosis not present

## 2019-12-27 MED ORDER — TOPIRAMATE 50 MG PO TABS: ORAL_TABLET | ORAL | 1 refills | Status: DC

## 2019-12-27 MED ORDER — PHENTERMINE HCL 37.5 MG PO TABS: ORAL_TABLET | ORAL | 1 refills | Status: DC

## 2019-12-27 NOTE — Patient Instructions (Signed)

## 2019-12-28 LAB — TSH: TSH: 4.44 mIU/L (ref 0.40–4.50)

## 2019-12-28 LAB — LIPID PANEL
Cholesterol: 200 mg/dL — ABNORMAL HIGH (ref <200)
HDL: 37 mg/dL — ABNORMAL LOW (ref >=50)
LDL Cholesterol (Calc): 124 mg/dL (calc) — ABNORMAL HIGH
Non-HDL Cholesterol (Calc): 163 mg/dL (calc) — ABNORMAL HIGH (ref <130)
Total CHOL/HDL Ratio: 5.4 (calc) — ABNORMAL HIGH (ref <5.0)
Triglycerides: 243 mg/dL — ABNORMAL HIGH (ref <150)

## 2019-12-28 LAB — CBC WITH DIFFERENTIAL/PLATELET
Absolute Monocytes: 422 cells/uL (ref 200–950)
Basophils Absolute: 40 cells/uL (ref 0–200)
Basophils Relative: 0.7 %
Eosinophils Absolute: 188 cells/uL (ref 15–500)
Eosinophils Relative: 3.3 %
HCT: 35.1 % (ref 35.0–45.0)
Hemoglobin: 11.1 g/dL — ABNORMAL LOW (ref 11.7–15.5)
Lymphs Abs: 1026 cells/uL (ref 850–3900)
MCH: 28.2 pg (ref 27.0–33.0)
MCHC: 31.6 g/dL — ABNORMAL LOW (ref 32.0–36.0)
MCV: 89.1 fL (ref 80.0–100.0)
MPV: 10.7 fL (ref 7.5–12.5)
Monocytes Relative: 7.4 %
Neutro Abs: 4024 cells/uL (ref 1500–7800)
Neutrophils Relative %: 70.6 %
Platelets: 199 10*3/uL (ref 140–400)
RBC: 3.94 10*6/uL (ref 3.80–5.10)
RDW: 13 % (ref 11.0–15.0)
Total Lymphocyte: 18 %
WBC: 5.7 10*3/uL (ref 3.8–10.8)

## 2019-12-28 LAB — COMPLETE METABOLIC PANEL WITH GFR
AG Ratio: 1.6 (calc) (ref 1.0–2.5)
ALT: 11 U/L (ref 6–29)
AST: 18 U/L (ref 10–35)
Albumin: 4 g/dL (ref 3.6–5.1)
Alkaline phosphatase (APISO): 117 U/L (ref 37–153)
BUN: 14 mg/dL (ref 7–25)
CO2: 27 mmol/L (ref 20–32)
Calcium: 9.3 mg/dL (ref 8.6–10.4)
Chloride: 107 mmol/L (ref 98–110)
Creat: 0.76 mg/dL (ref 0.50–0.99)
GFR, Est African American: 94 mL/min/{1.73_m2} (ref >=60)
GFR, Est Non African American: 81 mL/min/{1.73_m2} (ref >=60)
Globulin: 2.5 g/dL (calc) (ref 1.9–3.7)
Glucose, Bld: 107 mg/dL — ABNORMAL HIGH (ref 65–99)
Potassium: 4.2 mmol/L (ref 3.5–5.3)
Sodium: 141 mmol/L (ref 135–146)
Total Bilirubin: 0.4 mg/dL (ref 0.2–1.2)
Total Protein: 6.5 g/dL (ref 6.1–8.1)

## 2019-12-28 LAB — HEMOGLOBIN A1C
Hgb A1c MFr Bld: 5.7 % of total Hgb — ABNORMAL HIGH (ref <5.7)
Mean Plasma Glucose: 117 (calc)
eAG (mmol/L): 6.5 (calc)

## 2019-12-28 LAB — MAGNESIUM: Magnesium: 2.1 mg/dL (ref 1.5–2.5)

## 2019-12-28 LAB — INSULIN, RANDOM: Insulin: 49 u[IU]/mL — ABNORMAL HIGH

## 2019-12-28 LAB — VITAMIN D 25 HYDROXY (VIT D DEFICIENCY, FRACTURES): Vit D, 25-Hydroxy: 43 ng/mL (ref 30–100)

## 2019-12-28 NOTE — Progress Notes (Signed)
========================================================== -   Test results slightly outside the reference range are not unusual. If there is anything important, I will review this with you,  otherwise it is considered normal test values.  If you have further questions,  please do not hesitate to contact me at the office or via My Chart.  ==========================================================  -  mild anemia - which appears stable ==========================================================  -  Total Chol = 200 - elevated  (Ideal or Goal is less than 180)   - and   - Bad LDL Chol = 124 - also elevated  (Ideal or Goal is less than 70)   - if taking Crestor 3 x /week - then recommend increase to every day  ==========================================================  -  A1c = 5.7% - Borderline elevated sugars    - Avoid Sweets, Candy & White Stuff   - Rice, Potatoes, Breads &  Pasta ==========================================================  -  Vitamin D = 43 - very low   - Vitamin D goal is between 70-100.   $$$$$$$$$$$$$$$$$$$$$$$$$$$$$$$$$$$$$$$$$$$$$$$$$$$$$$$$$$$$  - Please INCREASE your Vitamin D 5,000 units to   2 capsules = 10,000 units /day   $$$$$$$$$$$$$$$$$$$$$$$$$$$$$$$$$$$$$$$$$$$$$$$$$$$$$$$$$$$$$  - It is very important as a natural anti-inflammatory and helping the  immune system protect against viral infections, like the Covid-19    helping hair, skin, and nails, as well as reducing stroke and  heart attack risk.   - It helps your bones and helps with mood.  - It also decreases numerous cancer risks so please  take it as directed.   - Low Vit D is associated with a 200-300% higher risk for  CANCER   and 200-300% higher risk for HEART   ATTACK  &  STROKE.    - It is also associated with higher death rate at younger ages,   autoimmune diseases like Rheumatoid arthritis, Lupus,  Multiple Sclerosis.     - Also many other serious conditions,  like depression, Alzheimer's  Dementia, infertility, muscle aches, fatigue, fibromyalgia   - just to name a few. ==========================================================  - All Else - CBC - Kidneys - Electrolytes - Liver - Magnesium & Thyroid    - all  Normal / OK ==========================================================

## 2020-01-05 DIAGNOSIS — R69 Illness, unspecified: Secondary | ICD-10-CM | POA: Diagnosis not present

## 2020-01-17 ENCOUNTER — Encounter (HOSPITAL_COMMUNITY): Payer: Self-pay | Admitting: Emergency Medicine

## 2020-01-17 ENCOUNTER — Other Ambulatory Visit: Payer: Self-pay

## 2020-01-17 ENCOUNTER — Ambulatory Visit (HOSPITAL_COMMUNITY)
Admission: EM | Admit: 2020-01-17 | Discharge: 2020-01-17 | Disposition: A | Discharge status: Home / Self Care | Payer: Medicare HMO

## 2020-01-17 DIAGNOSIS — H1132 Conjunctival hemorrhage, left eye: Secondary | ICD-10-CM

## 2020-01-17 MED ORDER — TETRACAINE HCL 0.5 % OP SOLN
OPHTHALMIC | Status: AC
  Filled 2020-01-17: qty 4

## 2020-01-17 MED ORDER — EYE WASH OPHTH SOLN
OPHTHALMIC | Status: AC
  Filled 2020-01-17: qty 118

## 2020-01-17 NOTE — ED Triage Notes (Signed)
Pt states she was trying to put on her reading glasses and accidentally hit herself in the left eye. Pt states that the corner of her eye was bleeding and she put ice on it. She states her vision in that eye is blurry.

## 2020-01-17 NOTE — ED Provider Notes (Signed)
[facility]  MRN: [id number] DOB: Oct 27, 1950  Subjective:   Teresa Vasquez is a 69 y.o. female presenting for suffering a left eye injury today.  Patient states that she accidentally hit the metal part of her eye while trying to put her glasses on.  Has had slight stinging sensation, slight blurred vision.  Denies eye swelling, bleeding.  Does not wear contacts.  No current facility-administered medications for this encounter.  Current Outpatient Medications:    ALPRAZolam (XANAX) 1 MG tablet, TAKE 1/2 TO 1 TABLET TWICE A DAY ONLY IF NEEDED FOR ANXIETY ATTACK., Disp: 90 tablet, Rfl: 0   aspirin 81 MG tablet, Take 81 mg by mouth daily. , Disp: , Rfl:    CHOLECALCIFEROL PO, Take 5,000 Units by mouth daily., Disp: , Rfl:    escitalopram (LEXAPRO) 20 MG tablet, TAKE ONE TABLET BY MOUTH DAILY, Disp: 90 tablet, Rfl: 1   fluticasone (FLONASE) 50 MCG/ACT nasal spray, PLACE 2 SPRAYS INTO EACH NOSTRIL ONCE DAILY, Disp: 48 mL, Rfl: 3   ibuprofen (ADVIL) 800 MG tablet, Take 1 tablet (800 mg total) by mouth every 8 (eight) hours as needed., Disp: 30 tablet, Rfl: 0   MAGNESIUM CITRATE PO, Take 400 mg by mouth daily., Disp: , Rfl:    montelukast (SINGULAIR) 10 MG tablet, Take 1 tablet (10 mg total) by mouth daily., Disp: 90 tablet, Rfl: 1   Omega-3 Fatty Acids (OMEGA-3 FISH OIL PO), Take by mouth daily., Disp: , Rfl:    omeprazole (PRILOSEC) 40 MG capsule, TAKE ONE CAPSULE BY MOUTH ONCE DAILY FOR INDIGESTION AND HEARTBURN., Disp: 90 capsule, Rfl: 1   phentermine (ADIPEX-P) 37.5 MG tablet, Take      1/2 to 1 tablet      every Morning      for Dieting & Weight Loss, Disp: 90 tablet, Rfl: 1   rosuvastatin (CRESTOR) 5 MG tablet, Start by taking 1 tab three nights a week (MWF) for cholesterol; slowly increase as tolerated up to taking daily., Disp: 90 tablet, Rfl: 1   topiramate (TOPAMAX) 50 MG tablet, Take      1/2 to 1 tablet      2 x /day      at Suppertime & Bedtime      for  Dieting & Weight Loss, Disp: 180 tablet, Rfl: 1   vitamin C (ASCORBIC ACID) 250 MG tablet, Take 250 mg by mouth daily., Disp: , Rfl:    Zinc 50 MG TABS, Take by mouth., Disp: , Rfl:    Allergies  Allergen Reactions   Atorvastatin Other (See Comments)    Leg cramps   Levofloxacin    Prednisone     Past Medical History:  Diagnosis Date   Anxiety and depression    Depression    Diverticulitis    DIVERTICULITIS 04/19/2009   Qualifier: Diagnosis of  By: Deatra Ina MD, Sandy Salaam    Hyperlipidemia    Hypertension    Irritable bowel syndrome      Past Surgical History:  Procedure Laterality Date   CHOLECYSTECTOMY N/A 12/17/2013   Procedure: LAPAROSCOPIC CHOLECYSTECTOMY WITH INTRAOPERATIVE CHOLANGIOGRAM;  Surgeon: Fanny Skates, MD;  Location: [facility];  Service: General;  Laterality: N/A;   COLON SURGERY  2012   Diverticulosis   KNEE ARTHROSCOPY Right 2019   x 2   TONSILLECTOMY     VAGINAL HYSTERECTOMY      Family History  Problem Relation Age of Onset   Heart disease Father  Heart attack Father 67   Heart disease Mother    Hypertension Mother    Osteoporosis Mother    Heart attack Brother 22   Heart disease Brother    Throat cancer Brother        remote smoker   Irritable bowel syndrome Sister    Brain cancer Sister 43   Cancer Paternal Aunt        oral    Social History   Tobacco Use   Smoking status: Never Smoker   Smokeless tobacco: Never Used  Scientific laboratory technician Use: Never used  Substance Use Topics   Alcohol use: No   Drug use: No    ROS   Objective:   Vitals: BP 130/69 (BP Location: Right Arm)    Pulse 96    Temp 97.9 F (36.6 C) (Oral)    SpO2 100%   Physical Exam Constitutional:      General: She is not in acute distress.    Appearance: Normal appearance. She is well-developed. She is not ill-appearing, toxic-appearing or diaphoretic.  HENT:     Head: Normocephalic and atraumatic.     Nose: Nose normal.      Mouth/Throat:     Mouth: Mucous membranes are moist.     Pharynx: Oropharynx is clear.  Eyes:     General: No scleral icterus.       Left eye: No foreign body, discharge or hordeolum.     Extraocular Movements: Extraocular movements intact.     Left eye: Normal extraocular motion.     Conjunctiva/sclera:     Left eye: Left conjunctiva is not injected. Hemorrhage (Medially over area outlined) present. No chemosis or exudate.    Pupils: Pupils are equal, round, and reactive to light.   Cardiovascular:     Rate and Rhythm: Normal rate.  Pulmonary:     Effort: Pulmonary effort is normal.  Skin:    General: Skin is warm and dry.  Neurological:     General: No focal deficit present.     Mental Status: She is alert and oriented to person, place, and time.  Psychiatric:        Mood and Affect: Mood normal.        Behavior: Behavior normal.     Eye Exam: Eyelids everted and swept for foreign body. The eye was anesthetized with 2 drops of tetracaine and stained with fluorescein. Examination under woods lamp does not reveal a foreign body or area of increased stain uptake. The eye was then irrigated copiously with saline.   Assessment and Plan :   PDMP not reviewed this encounter.  1. Subconjunctival hemorrhage of left eye     Anticipatory guidance provided for subconjunctival hemorrhage of the left eye.  Recommended Tylenol for pain control.  Follow-up with Dr. Katy Fitch to establish care and also recheck her eye. Counseled patient on potential for adverse effects with medications prescribed/recommended today, ER and return-to-clinic precautions discussed, patient verbalized understanding.    Jaynee Eagles, PA-C 01/17/20 1657

## 2020-01-17 NOTE — Discharge Instructions (Addendum)
Do not use any nonsteroidal anti-inflammatories (NSAIDs) like ibuprofen, Motrin, naproxen, Aleve, etc. which are all available over-the-counter.  Please just use Tylenol at a dose of 500mg -650mg  once every 6 hours as needed for your eye pain.

## 2020-01-31 DIAGNOSIS — R69 Illness, unspecified: Secondary | ICD-10-CM | POA: Diagnosis not present

## 2020-02-03 ENCOUNTER — Other Ambulatory Visit: Payer: Self-pay | Admitting: Adult Health

## 2020-02-03 DIAGNOSIS — F411 Generalized anxiety disorder: Secondary | ICD-10-CM

## 2020-02-16 DIAGNOSIS — R69 Illness, unspecified: Secondary | ICD-10-CM | POA: Diagnosis not present

## 2020-02-22 ENCOUNTER — Other Ambulatory Visit: Payer: Self-pay | Admitting: Adult Health

## 2020-03-06 ENCOUNTER — Other Ambulatory Visit: Payer: Self-pay

## 2020-03-06 ENCOUNTER — Encounter (HOSPITAL_COMMUNITY): Payer: Self-pay | Admitting: Emergency Medicine

## 2020-03-06 ENCOUNTER — Ambulatory Visit (HOSPITAL_COMMUNITY)
Admission: EM | Admit: 2020-03-06 | Discharge: 2020-03-06 | Disposition: A | Discharge status: Home / Self Care | Payer: Medicare HMO | Attending: Urgent Care | Admitting: Urgent Care

## 2020-03-06 ENCOUNTER — Ambulatory Visit (INDEPENDENT_AMBULATORY_CARE_PROVIDER_SITE_OTHER): Payer: Medicare HMO

## 2020-03-06 DIAGNOSIS — R519 Headache, unspecified: Secondary | ICD-10-CM | POA: Diagnosis not present

## 2020-03-06 DIAGNOSIS — R5383 Other fatigue: Secondary | ICD-10-CM

## 2020-03-06 DIAGNOSIS — R6883 Chills (without fever): Secondary | ICD-10-CM | POA: Diagnosis not present

## 2020-03-06 DIAGNOSIS — R059 Cough, unspecified: Secondary | ICD-10-CM

## 2020-03-06 DIAGNOSIS — R0989 Other specified symptoms and signs involving the circulatory and respiratory systems: Secondary | ICD-10-CM

## 2020-03-06 DIAGNOSIS — J0191 Acute recurrent sinusitis, unspecified: Secondary | ICD-10-CM

## 2020-03-06 MED ORDER — PROMETHAZINE-DM 6.25-15 MG/5ML PO SYRP: 5.0000 mL | ORAL_SOLUTION | Freq: Every evening | ORAL | 0 refills | Status: DC | PRN

## 2020-03-06 MED ORDER — AMOXICILLIN-POT CLAVULANATE 875-125 MG PO TABS: 1.0000 | ORAL_TABLET | Freq: Two times a day (BID) | ORAL | 0 refills | Status: DC

## 2020-03-06 NOTE — ED Triage Notes (Signed)
Pt states that she has a HA, cough, chills, and fatigue that started ten days ago. Pt states that the cough started a month ago but has gotten worse.

## 2020-03-06 NOTE — ED Provider Notes (Signed)
[facility]   MRN: [id number] DOB: 1950-09-03  Subjective:   Teresa Vasquez is a 71 y.o. female presenting for 1 month history of persistent and worsening cough with chest congestion.  In the past 10 days she started more sinus headaches, chills and fatigue.  Has a history of allergic rhinitis and sinus infections.  Denies history of asthma inhaler.  She is Covid vaccinated.  No current facility-administered medications for this encounter.  Current Outpatient Medications:  .  ALPRAZolam (XANAX) 1 MG tablet, TAKE 1/2 TO 1 TABLET BY MOUTH TWICE A DAY AS NEEDED IF NEEDED FOR ANXIETY ATTACK, Disp: 90 tablet, Rfl: 0 .  aspirin 81 MG tablet, Take 81 mg by mouth daily. , Disp: , Rfl:  .  CHOLECALCIFEROL PO, Take 5,000 Units by mouth daily., Disp: , Rfl:  .  escitalopram (LEXAPRO) 20 MG tablet, TAKE ONE TABLET BY MOUTH DAILY, Disp: 90 tablet, Rfl: 1 .  fluticasone (FLONASE) 50 MCG/ACT nasal spray, PLACE 2 SPRAYS INTO EACH NOSTRIL ONCE DAILY, Disp: 48 mL, Rfl: 3 .  ibuprofen (ADVIL) 800 MG tablet, Take 1 tablet (800 mg total) by mouth every 8 (eight) hours as needed., Disp: 30 tablet, Rfl: 0 .  MAGNESIUM CITRATE PO, Take 400 mg by mouth daily., Disp: , Rfl:  .  montelukast (SINGULAIR) 10 MG tablet, Take 1 tablet (10 mg total) by mouth daily., Disp: 90 tablet, Rfl: 1 .  Omega-3 Fatty Acids (OMEGA-3 FISH OIL PO), Take by mouth daily., Disp: , Rfl:  .  omeprazole (PRILOSEC) 40 MG capsule, TAKE ONE CAPSULE BY MOUTH ONCE DAILY FOR INDIGESTION AND HEARTBURN., Disp: 90 capsule, Rfl: 1 .  phentermine (ADIPEX-P) 37.5 MG tablet, Take      1/2 to 1 tablet      every Morning      for Dieting & Weight Loss, Disp: 90 tablet, Rfl: 1 .  rosuvastatin (CRESTOR) 5 MG tablet, START BY TAKE 1 TABLET 3 NIGHTS A WEEK MON/WED/FRI. SLOWLY INCREASE AS TOLERATED TO DAILY, Disp: 90 tablet, Rfl: 1 .  topiramate (TOPAMAX) 50 MG tablet, Take      1/2 to 1 tablet      2 x /day      at Suppertime & Bedtime       for Dieting & Weight Loss, Disp: 180 tablet, Rfl: 1 .  vitamin C (ASCORBIC ACID) 250 MG tablet, Take 250 mg by mouth daily., Disp: , Rfl:  .  Zinc 50 MG TABS, Take by mouth., Disp: , Rfl:    Allergies  Allergen Reactions  . Atorvastatin Other (See Comments)    Leg cramps  . Levofloxacin   . Prednisone     Past Medical History:  Diagnosis Date  . Anxiety and depression   . Depression   . Diverticulitis   . DIVERTICULITIS 04/19/2009   Qualifier: Diagnosis of  By: Arlyce Dice MD, Barbette Hair   . Hyperlipidemia   . Hypertension   . Irritable bowel syndrome      Past Surgical History:  Procedure Laterality Date  . CHOLECYSTECTOMY N/A 12/17/2013   Procedure: LAPAROSCOPIC CHOLECYSTECTOMY WITH INTRAOPERATIVE CHOLANGIOGRAM;  Surgeon: Claud Kelp, MD;  Location: [facility];  Service: General;  Laterality: N/A;  . COLON SURGERY  2012   Diverticulosis  . KNEE ARTHROSCOPY Right 2019   x 2  . TONSILLECTOMY    . VAGINAL HYSTERECTOMY      Family History  Problem Relation Age of Onset  . Heart disease Father   .  Heart attack Father 98  . Heart disease Mother   . Hypertension Mother   . Osteoporosis Mother   . Heart attack Brother 80  . Heart disease Brother   . Throat cancer Brother        remote smoker  . Irritable bowel syndrome Sister   . Brain cancer Sister 47  . Cancer Paternal Aunt        oral    Social History   Tobacco Use  . Smoking status: Never Smoker  . Smokeless tobacco: Never Used  Vaping Use  . Vaping Use: Never used  Substance Use Topics  . Alcohol use: No  . Drug use: No    ROS   Objective:   Vitals: BP 130/76 (BP Location: Right Arm)   Pulse (!) 106   Temp 98 F (36.7 C) (Temporal)   Resp 18   SpO2 99%   Physical Exam Constitutional:      General: She is not in acute distress.    Appearance: Normal appearance. She is well-developed. She is ill-appearing. She is not toxic-appearing or diaphoretic.  HENT:     Head: Normocephalic and atraumatic.      Right Ear: Tympanic membrane and ear canal normal. No drainage or tenderness. No middle ear effusion. Tympanic membrane is not erythematous.     Left Ear: Tympanic membrane and ear canal normal. No drainage or tenderness.  No middle ear effusion. Tympanic membrane is not erythematous.     Nose: Nose normal. No congestion or rhinorrhea.     Mouth/Throat:     Mouth: Mucous membranes are moist. No oral lesions.     Pharynx: Oropharynx is clear. No pharyngeal swelling, oropharyngeal exudate, posterior oropharyngeal erythema or uvula swelling.     Tonsils: No tonsillar exudate or tonsillar abscesses.  Eyes:     Extraocular Movements: Extraocular movements intact.     Right eye: Normal extraocular motion.     Left eye: Normal extraocular motion.     Conjunctiva/sclera: Conjunctivae normal.     Pupils: Pupils are equal, round, and reactive to light.  Cardiovascular:     Rate and Rhythm: Normal rate and regular rhythm.     Pulses: Normal pulses.     Heart sounds: Normal heart sounds. No murmur heard. No friction rub. No gallop.   Pulmonary:     Effort: Pulmonary effort is normal. No respiratory distress.     Breath sounds: Normal breath sounds. No stridor. No wheezing, rhonchi or rales.  Musculoskeletal:     Cervical back: Normal range of motion and neck supple.  Lymphadenopathy:     Cervical: No cervical adenopathy.  Skin:    General: Skin is warm and dry.     Findings: No rash.  Neurological:     General: No focal deficit present.     Mental Status: She is alert and oriented to person, place, and time.  Psychiatric:        Mood and Affect: Mood normal.        Behavior: Behavior normal.        Thought Content: Thought content normal.     DG Chest 2 View  Result Date: 03/06/2020 CLINICAL DATA:  Cough and chest congestion. Also complaining of headache, chills and fatigue with symptoms beginning approximately 10 days ago. EXAM: CHEST - 2 VIEW COMPARISON:  04/02/2017 FINDINGS:  Cardiac silhouette is normal in size. No mediastinal or hilar masses or evidence of adenopathy. Linear scarring noted in the left upper lobe lingula, stable.  The lungs are otherwise clear. No pleural effusion or pneumothorax. Skeletal structures are intact. IMPRESSION: No active cardiopulmonary disease. Electronically Signed   By: Lajean Manes M.D.   On: 03/06/2020 17:01    Assessment and Plan :   PDMP not reviewed this encounter.  1. Acute recurrent sinusitis, unspecified location   2. Cough   3. Chest congestion     Will start empiric treatment for sinusitis with Augmentin.  Recommended supportive care otherwise. Counseled patient on potential for adverse effects with medications prescribed/recommended today, ER and return-to-clinic precautions discussed, patient verbalized understanding.    Jaynee Eagles, [place] 03/06/20 1713

## 2020-03-07 ENCOUNTER — Other Ambulatory Visit: Payer: Self-pay | Admitting: Adult Health

## 2020-03-09 ENCOUNTER — Encounter: Payer: Self-pay | Admitting: Adult Health Nurse Practitioner

## 2020-03-09 ENCOUNTER — Other Ambulatory Visit: Payer: Self-pay

## 2020-03-09 ENCOUNTER — Ambulatory Visit (INDEPENDENT_AMBULATORY_CARE_PROVIDER_SITE_OTHER): Payer: Medicare HMO | Admitting: Adult Health Nurse Practitioner

## 2020-03-09 VITALS — BP 136/84 | HR 75 | Temp 97.5°F | Wt 222.0 lb

## 2020-03-09 DIAGNOSIS — Z1152 Encounter for screening for COVID-19: Secondary | ICD-10-CM

## 2020-03-09 DIAGNOSIS — J40 Bronchitis, not specified as acute or chronic: Secondary | ICD-10-CM

## 2020-03-09 DIAGNOSIS — R69 Illness, unspecified: Secondary | ICD-10-CM | POA: Diagnosis not present

## 2020-03-09 DIAGNOSIS — R062 Wheezing: Secondary | ICD-10-CM

## 2020-03-09 LAB — POC COVID19 BINAXNOW: SARS Coronavirus 2 Ag: NEGATIVE

## 2020-03-09 MED ORDER — DEXAMETHASONE 4 MG PO TABS: ORAL_TABLET | ORAL | 1 refills | Status: DC

## 2020-03-09 MED ORDER — ALBUTEROL SULFATE HFA 108 (90 BASE) MCG/ACT IN AERS: 2.0000 | INHALATION_SPRAY | RESPIRATORY_TRACT | 0 refills | Status: DC | PRN

## 2020-03-09 NOTE — Patient Instructions (Addendum)
You received an injection of dexamethasone in the office today.   A Dexamethasone steroid taper has been sent to the pharmacy for you to help reduce inflammation.  We will send in Ventolin (Albuterol) inhaler.  Continue Taking the Mucinex DM every 12 hours.  You can try humidification or Warm steamy shower to help loosen chest.   For nasal drainage, take a Cetirizine (Zyrtec) every night to help dry up any fluid draining in back of your throat.  Continue to take Vitamin D supplement to help your immune system.  Stay hydrated drinking lots of water or some Gatorade.

## 2020-03-09 NOTE — Progress Notes (Signed)
Assessment and Plan: Teresa Vasquez was seen today for chills, cough and shortness of breath.  Diagnoses and all orders for this visit:  Encounter for screening for COVID-19 -     POC COVID-19 BinaxNow - Neg  Bronchitis -     dexamethasone (DECADRON) 4 MG tablet; Take 1 tab 3 x day - 3 days, then 2 x day - 3 days, then 1 tab daily Take mucinex DM Q12 Discussed humidifier or warm steamy shower  Wheezing -     albuterol (VENTOLIN HFA) 108 (90 Base) MCG/ACT inhaler; Inhale 2 puffs into the lungs every 4 (four) hours as needed for wheezing or shortness of breath. Please give generic or the one that insurance covers -     dexamethasone (DECADRON) 4 MG tablet; Take 1 tab 3 x day - 3 days, then 2 x day - 3 days, then 1 tab daily   Contact office with any new or worsening symptoms.    Further disposition pending results of labs. Discussed med's effects and SE's.   Over 30 minutes of face to face interview, exam, counseling, chart review, and critical decision making was performed.   Future Appointments  Date Time Provider [place]  04/20/2020  2:00 PM Liane Comber, NP [facility] None  08/30/2020  2:00 PM Liane Comber, NP [facility] None    ------------------------------------------------------------------------------------------------------------------   HPI 70 y.o.female presents for evaluation of symptoms that started 02/28/20.  She went to urgent care two days ago and was given promethazine cough syrup and Augmentin that she has taken two day worth of this w/o complications.  She reports she does not feel any better.  She continues to cough that can be productive at times.  She reports some wheezing.  She does not notice an increase in coughing at night as she sleeps propped up on pillows, always has.  She reports some shortness of breath as well.  She also has a headache related to the coughing.  She was COVID Neg today by antigen test.  Duoneb breathing treatment in  office today, tolerated well.  Wheezing improved but still present.  In office IM dexamethasone. Discussed this with patient.  Has had reaction to prednisone in the past but tolerates injection.  Will send in dexamethasone for her to take PO.  Monitor for side effects.   Past Medical History:  Diagnosis Date  . Anxiety and depression   . Depression   . Diverticulitis   . DIVERTICULITIS 04/19/2009   Qualifier: Diagnosis of  By: Deatra Ina MD, Sandy Salaam   . Hyperlipidemia   . Hypertension   . Irritable bowel syndrome      Allergies  Allergen Reactions  . Atorvastatin Other (See Comments)    Leg cramps  . Levofloxacin   . Prednisone     Current Outpatient Medications on File Prior to Visit  Medication Sig  . ALPRAZolam (XANAX) 1 MG tablet TAKE 1/2 TO 1 TABLET BY MOUTH TWICE A DAY AS NEEDED IF NEEDED FOR ANXIETY ATTACK  . amoxicillin-clavulanate (AUGMENTIN) 875-125 MG tablet Take 1 tablet by mouth every 12 (twelve) hours.  Marland Kitchen aspirin 81 MG tablet Take 81 mg by mouth daily.   . CHOLECALCIFEROL PO Take 5,000 Units by mouth daily.  Marland Kitchen escitalopram (LEXAPRO) 20 MG tablet TAKE ONE TABLET BY MOUTH DAILY  . fluticasone (FLONASE) 50 MCG/ACT nasal spray PLACE 2 SPRAYS INTO EACH NOSTRIL ONCE DAILY  . ibuprofen (ADVIL) 800 MG tablet Take 1 tablet (800 mg total) by mouth every 8 (  eight) hours as needed.  Marland Kitchen MAGNESIUM CITRATE PO Take 400 mg by mouth daily.  . montelukast (SINGULAIR) 10 MG tablet Take    1 tablet     Daily       for Allergies  . Omega-3 Fatty Acids (OMEGA-3 FISH OIL PO) Take by mouth daily.  Marland Kitchen omeprazole (PRILOSEC) 40 MG capsule TAKE ONE CAPSULE BY MOUTH ONCE DAILY FOR INDIGESTION AND HEARTBURN.  Marland Kitchen phentermine (ADIPEX-P) 37.5 MG tablet Take      1/2 to 1 tablet      every Morning      for Dieting & Weight Loss  . promethazine-dextromethorphan (PROMETHAZINE-DM) 6.25-15 MG/5ML syrup Take 5 mLs by mouth at bedtime as needed for cough.  . rosuvastatin (CRESTOR) 5 MG tablet START BY TAKE 1  TABLET 3 NIGHTS A WEEK MON/WED/FRI. SLOWLY INCREASE AS TOLERATED TO DAILY  . topiramate (TOPAMAX) 50 MG tablet Take      1/2 to 1 tablet      2 x /day      at Suppertime & Bedtime      for Dieting & Weight Loss  . vitamin C (ASCORBIC ACID) 250 MG tablet Take 250 mg by mouth daily.  . Zinc 50 MG TABS Take by mouth.   No current facility-administered medications on file prior to visit.    ROS: all negative except above.   Physical Exam:  BP 136/84   Pulse 75   Temp (!) 97.5 F (36.4 C)   Wt 222 lb (100.7 kg)   SpO2 94%   BMI 35.29 kg/m   General Appearance: Well nourished, in no apparent distress. Eyes: PERRLA, EOMs, conjunctiva no swelling or erythema Sinuses: No Frontal/maxillary tenderness ENT/Mouth: Ext aud canals clear, TMs without erythema, bulging. No erythema, swelling, or exudate on post pharynx.  Tonsils not swollen or erythematous. Hearing normal.  Neck: Supple, thyroid normal.  Respiratory: Respiratory effort normal, BS equal bilaterally with wheezing, without rales, wheezing or stridor.  Cardio: RRR with no MRGs. Brisk peripheral pulses without edema.  Abdomen: Soft, + BS.  Non tender, no guarding, rebound, hernias, masses. Lymphatics: Non tender without lymphadenopathy.  Musculoskeletal: Full ROM, 5/5 strength, normal gait.  Skin: Warm, dry without rashes, lesions, ecchymosis.  Neuro: Cranial nerves intact. Normal muscle tone, no cerebellar symptoms. Sensation intact.  Psych: Awake and oriented X 3, normal affect, Insight and Judgment appropriate.    Elder Negus, Edrick Oh, DNP [facility] 03/09/2020  5:30 PM

## 2020-03-22 ENCOUNTER — Other Ambulatory Visit: Payer: Self-pay | Admitting: Adult Health

## 2020-03-22 DIAGNOSIS — F411 Generalized anxiety disorder: Secondary | ICD-10-CM

## 2020-03-30 DIAGNOSIS — R69 Illness, unspecified: Secondary | ICD-10-CM | POA: Diagnosis not present

## 2020-04-10 ENCOUNTER — Other Ambulatory Visit: Payer: Self-pay

## 2020-04-10 ENCOUNTER — Ambulatory Visit (HOSPITAL_COMMUNITY)
Admission: EM | Admit: 2020-04-10 | Discharge: 2020-04-10 | Disposition: A | Discharge status: Home / Self Care | Payer: BC Managed Care – PPO | Attending: Internal Medicine | Admitting: Internal Medicine

## 2020-04-10 ENCOUNTER — Encounter (HOSPITAL_COMMUNITY): Payer: Self-pay | Admitting: Emergency Medicine

## 2020-04-10 DIAGNOSIS — R1031 Right lower quadrant pain: Secondary | ICD-10-CM | POA: Diagnosis not present

## 2020-04-10 DIAGNOSIS — R112 Nausea with vomiting, unspecified: Secondary | ICD-10-CM | POA: Diagnosis not present

## 2020-04-10 DIAGNOSIS — R197 Diarrhea, unspecified: Secondary | ICD-10-CM | POA: Diagnosis not present

## 2020-04-10 LAB — POCT URINALYSIS DIPSTICK, ED / UC
Bilirubin Urine: NEGATIVE
Glucose, UA: NEGATIVE mg/dL
Hgb urine dipstick: NEGATIVE
Ketones, ur: NEGATIVE mg/dL
Leukocytes,Ua: NEGATIVE
Nitrite: NEGATIVE
Protein, ur: NEGATIVE mg/dL
Specific Gravity, Urine: >=1.03 (ref 1.005–1.030)
Urobilinogen, UA: 0.2 mg/dL (ref 0.0–1.0)
pH: 5.5 (ref 5.0–8.0)

## 2020-04-10 NOTE — ED Provider Notes (Signed)
Teresa Vasquez    CSN: [id number] Arrival date & time: 04/10/20  1230      History   Chief Complaint Chief Complaint  Patient presents with  . Abdominal Pain    HPI Teresa Vasquez is a 70 y.o. female.   Patient presents with right lower quadrant abdominal pain x4 days.  She states the pain has been getting worse.  She also reports vomiting and diarrhea.  She denies fever, chills, dysuria, hematuria, vaginal discharge, pelvic pain, or other symptoms.  No treatments attempted at home.  Her medical history includes hypertension, morbid obesity, IBS, diverticulitis, anxiety, depression.  The history is provided by the patient and medical records.    Past Medical History:  Diagnosis Date  . Anxiety and depression   . Depression   . Diverticulitis   . DIVERTICULITIS 04/19/2009   Qualifier: Diagnosis of  By: Deatra Ina MD, Sandy Salaam   . Hyperlipidemia   . Hypertension   . Irritable bowel syndrome     Patient Active Problem List   Diagnosis Date Noted  . Plantar fasciitis of left foot 06/16/2018  . GERD (gastroesophageal reflux disease) 03/06/2018  . Morbid obesity ([place]) 08/27/2017  . Allergic rhinitis 03/14/2013  . Vitamin D deficiency 03/14/2013  . Elevated blood pressure reading without diagnosis of hypertension 03/14/2013  . Other abnormal glucose (prediabetes) 03/14/2013  . Mixed hyperlipidemia 03/14/2013  . Anxiety 04/13/2009  . Major depressive disorder in partial remission ([place]) 04/13/2009  . IRRITABLE BOWEL SYNDROME 04/13/2009    Past Surgical History:  Procedure Laterality Date  . CHOLECYSTECTOMY N/A 12/17/2013   Procedure: LAPAROSCOPIC CHOLECYSTECTOMY WITH INTRAOPERATIVE CHOLANGIOGRAM;  Surgeon: Fanny Skates, MD;  Location: [place];  Service: General;  Laterality: N/A;  . COLON SURGERY  2012   Diverticulosis  . KNEE ARTHROSCOPY Right 2019   x 2  . TONSILLECTOMY    . VAGINAL HYSTERECTOMY      OB History   No obstetric history on file.      Home  Medications    Prior to Admission medications   Medication Sig Start Date End Date Taking? Authorizing Provider  albuterol (VENTOLIN HFA) 108 (90 Base) MCG/ACT inhaler Inhale 2 puffs into the lungs every 4 (four) hours as needed for wheezing or shortness of breath. Please give generic or the one that insurance covers 03/09/20   Garnet Sierras, NP  ALPRAZolam (XANAX) 1 MG tablet TAKE 1/2 TO 1 TABLET BY MOUTH TWICE A DAY AS NEEDED IF NEEDED FOR ANXIETY ATTACK 03/22/20   Garnet Sierras, NP  amoxicillin-clavulanate (AUGMENTIN) 875-125 MG tablet Take 1 tablet by mouth every 12 (twelve) hours. 03/06/20   Jaynee Eagles, PA-C  aspirin 81 MG tablet Take 81 mg by mouth daily.     [provider]  CHOLECALCIFEROL PO Take 5,000 Units by mouth daily.    [provider]  dexamethasone (DECADRON) 4 MG tablet Take 1 tab 3 x day - 3 days, then 2 x day - 3 days, then 1 tab daily 03/09/20   Garnet Sierras, NP  escitalopram (LEXAPRO) 20 MG tablet TAKE ONE TABLET BY MOUTH DAILY 07/14/19   Liane Comber, NP  fluticasone [facility]) 50 MCG/ACT nasal spray PLACE 2 SPRAYS INTO EACH NOSTRIL ONCE DAILY 08/15/19   Unk Pinto, MD  ibuprofen (ADVIL) 800 MG tablet Take 1 tablet (800 mg total) by mouth every 8 (eight) hours as needed. 07/13/18   Trula Slade, DPM  MAGNESIUM CITRATE PO Take 400 mg by mouth daily.  [provider]  montelukast (SINGULAIR) 10 MG tablet Take    1 tablet     Daily       for Allergies 03/07/20   Unk Pinto, MD  Omega-3 Fatty Acids (OMEGA-3 FISH OIL PO) Take by mouth daily.    [provider]  omeprazole (PRILOSEC) 40 MG capsule TAKE ONE CAPSULE BY MOUTH ONCE DAILY FOR INDIGESTION AND HEARTBURN. 12/08/19   Liane Comber, NP  phentermine (ADIPEX-P) 37.5 MG tablet Take      1/2 to 1 tablet      every Morning      for Dieting & Weight Loss 12/27/19   Unk Pinto, MD  promethazine-dextromethorphan (PROMETHAZINE-DM) 6.25-15 MG/5ML syrup Take 5 mLs by  mouth at bedtime as needed for cough. 03/06/20   Jaynee Eagles, PA-C  rosuvastatin (CRESTOR) 5 MG tablet START BY TAKE 1 TABLET 3 NIGHTS A WEEK MON/WED/FRI. SLOWLY INCREASE AS TOLERATED TO DAILY 02/22/20   Liane Comber, NP  topiramate (TOPAMAX) 50 MG tablet Take      1/2 to 1 tablet      2 x /day      at Suppertime & Bedtime      for Dieting & Weight Loss 12/27/19   Unk Pinto, MD  vitamin C (ASCORBIC ACID) 250 MG tablet Take 250 mg by mouth daily.    [provider]  Zinc 50 MG TABS Take by mouth.    [provider]    Family History Family History  Problem Relation Age of Onset  . Heart disease Father   . Heart attack Father 75  . Heart disease Mother   . Hypertension Mother   . Osteoporosis Mother   . Heart attack Brother 53  . Heart disease Brother   . Throat cancer Brother        remote smoker  . Irritable bowel syndrome Sister   . Brain cancer Sister 8  . Cancer Paternal Aunt        oral    Social History Social History   Tobacco Use  . Smoking status: Never Smoker  . Smokeless tobacco: Never Used  Vaping Use  . Vaping Use: Never used  Substance Use Topics  . Alcohol use: No  . Drug use: No     Allergies   Atorvastatin, Levofloxacin, and Prednisone   Review of Systems Review of Systems  Constitutional: Negative for chills and fever.  HENT: Negative for ear pain and sore throat.   Eyes: Negative for pain and visual disturbance.  Respiratory: Negative for cough and shortness of breath.   Cardiovascular: Negative for chest pain and palpitations.  Gastrointestinal: Positive for abdominal pain, diarrhea and vomiting.  Genitourinary: Negative for dysuria and hematuria.  Musculoskeletal: Negative for arthralgias and back pain.  Skin: Negative for color change and rash.  Neurological: Negative for seizures and syncope.  All other systems reviewed and are negative.    Physical Exam Triage Vital Signs ED Triage Vitals  Enc Vitals Group      BP      Pulse      Resp      Temp      Temp src      SpO2      Weight      Height      Head Circumference      Peak Flow      Pain Score      Pain Loc      Pain Edu?  Excl. in [place]?    No data found.  Updated Vital Signs BP 104/84 (BP Location: Left Arm)   Pulse 98   Temp 98.4 F (36.9 C) (Oral)   Resp 18   SpO2 100%   Visual Acuity Right Eye Distance:   Left Eye Distance:   Bilateral Distance:    Right Eye Near:   Left Eye Near:    Bilateral Near:     Physical Exam Vitals and nursing note reviewed.  Constitutional:      General: She is not in acute distress.    Appearance: She is well-developed and well-nourished. She is not ill-appearing.  HENT:     Head: Normocephalic and atraumatic.     Mouth/Throat:     Mouth: Mucous membranes are moist.  Eyes:     Conjunctiva/sclera: Conjunctivae normal.  Cardiovascular:     Rate and Rhythm: Normal rate and regular rhythm.     Heart sounds: Normal heart sounds.  Pulmonary:     Effort: Pulmonary effort is normal. No respiratory distress.     Breath sounds: Normal breath sounds.  Abdominal:     General: Bowel sounds are normal.     Palpations: Abdomen is soft.     Tenderness: There is no abdominal tenderness. There is no right CVA tenderness, left CVA tenderness, guarding or rebound.  Musculoskeletal:        General: No edema.     Cervical back: Neck supple.  Skin:    General: Skin is warm and dry.  Neurological:     General: No focal deficit present.     Mental Status: She is alert and oriented to person, place, and time.     Gait: Gait normal.  Psychiatric:        Mood and Affect: Mood and affect and mood normal.        Behavior: Behavior normal.      UC Treatments / Results  Labs (all labs ordered are listed, but only abnormal results are displayed) Labs Reviewed  POCT URINALYSIS DIPSTICK, ED / UC - Abnormal    EKG   Radiology No results found.  Procedures Procedures (including  critical care time)  Medications Ordered in UC Medications - No data to display  Initial Impression / Assessment and Plan / UC Course  I have reviewed the triage vital signs and the nursing notes.  Pertinent labs & imaging results that were available during my care of the patient were reviewed by me and considered in my medical decision making (see chart for details).   RLQ abdominal pain, Nausea, vomiting, diarrhea.  Discussed with patient the limitations of being seen in the urgent care for abdominal pain.  Instructed her to go to the ED if she has acute abdominal pain or other concerning symptoms.  Discussed that her urine does not show signs of infection but that she does need to increase her water intake.  Instructed her to follow-up with her PCP or gastroenterologist if her symptoms are not improving.  She agrees to plan of care.   Final Clinical Impressions(s) / UC Diagnoses   Final diagnoses:  Right lower quadrant abdominal pain  Nausea vomiting and diarrhea     Discharge Instructions     Go to the emergency department if you have acute abdominal pain or other concerning symptoms.    Your urine does not show signs of infection.  You need to increase your water intake.    Follow-up with your primary care provider if your  symptoms are not improving.        ED Prescriptions    None     PDMP not reviewed this encounter.   Sharion Balloon, NP 04/10/20 1540

## 2020-04-10 NOTE — ED Triage Notes (Signed)
Patient presents to [facility] for lower central abdominal pain starting 4 days ago.  Patient states pain has worsened, and moved more to the RLQ.  Patient c/o emesis x 2 days Thursday and Friday of last week.  C/o chills, denies fevers.  Denies changes to urination, but states UTI 6 weeks ago.

## 2020-04-10 NOTE — Discharge Instructions (Signed)
Go to the emergency department if you have acute abdominal pain or other concerning symptoms.    Your urine does not show signs of infection.  You need to increase your water intake.    Follow-up with your primary care provider if your symptoms are not improving.

## 2020-04-11 ENCOUNTER — Other Ambulatory Visit: Payer: Self-pay | Admitting: Adult Health

## 2020-04-18 DIAGNOSIS — R69 Illness, unspecified: Secondary | ICD-10-CM | POA: Diagnosis not present

## 2020-04-19 NOTE — Progress Notes (Signed)
Complete Physical  Assessment and Plan:   Encounter for general adult medical examination with abnormal findings 1 year  Allergic rhinitis due to pollen, unspecified seasonality Continue allergy medication  Irritable bowel syndrome, unspecified type If not on benefiber then add it, decrease stress,  if any worsening symptoms, blood in stool, AB pain, etc call office  Elevated blood pressure reading without diagnosis of hypertension - continue medications, DASH diet, exercise and monitor at home. Call if greater than 130/80.  -     CBC with Differential/Platelet -     CMP/GFR -     TSH -     Urinalysis, Routine w reflex microscopic -     Microalbumin / creatinine urine ratio -     EKG 12-Lead  Other abnormal glucose Discussed disease progression and risks Discussed diet/exercise, weight management and risk modification  Mixed hyperlipidemia -continue medications, LDL goal <100, check lipids, decrease fatty foods, increase activity.  -     Lipid panel  Vitamin D deficiency -     VITAMIN D 25 Hydroxy (Vit-D Deficiency, Fractures)  Recurrent depression in partial remission /Anxiety state/ insomnia  Continue lexapro and counseling; extended discussion re: xanax daily use and benzo as daily sleep agent  New guidelines suggest the benzodiazepines are best short term, with prolonged use they lead to physical and psychological dependence. In addition, evidence suggest that for insomnia the effectiveness wanes in 4 weeks and the risks out weight their benefits. Use of these agents have been associated with dementia, falls, motor vehicle accidents and physical addiction. Decreasing these medication have been proven to show improvements in cognition, alertness, decrease of falls and daytime sedation. I recommended alternative sleep aid, trazodone, gabapentin suggested; she declined trial of alternative agent at this time. She understands script needs to last longer than 30 days and dose will  not be increased. She may experience tolerance and consequent withdrawal sx with attempt to taper. Symptoms of withdrawal include, insomnia, anxiety, irritability, sweating and stomach or intestinal symptoms like diarrhea or nausea. May refer to psych but declines at this time.   Lifestyle discussed: diet/exerise, sleep hygiene, stress management, hydration   Obesity Long discussion about weight loss, diet, and exercise Recommended diet heavy in fruits and veggies and low in animal meats, cheeses, and dairy products, appropriate calorie intake Discussed appropriate weight for height  Follow up at next visit  Medication management -     Magnesium  Estrogen def DUE for DEXA screening, ordered last year but never scheduled She has GYN follow up this year, may discuss and order then Encouraged high calcium diet, vitamin D supplement as indicated  RLQ abd/pelvic pain in female Vague, non-specific; no pain or palpable abnormality at time of exam today Will check routine labs; try tylenol for pain, bowel management encouraged Follow up if persistent, consider pelvic US   Orders Placed This Encounter  Procedures  . Pneumococcal polysaccharide vaccine 23-valent greater than or equal to 2yo subcutaneous/IM  . CBC with Differential/Platelet  . COMPLETE METABOLIC PANEL WITH GFR  . Magnesium  . Lipid panel  . TSH  . Hemoglobin A1c  . VITAMIN D 25 Hydroxy (Vit-D Deficiency, Fractures)  . Microalbumin / creatinine urine ratio  . Urinalysis, Routine w reflex microscopic  . EKG 12-Lead     Discussed med's effects and SE's. Screening labs and tests as requested with regular follow-up as recommended. Future Appointments  Date Time Provider [place]  08/30/2020  2:00 PM Liane Comber, NP [facility] None  04/23/2021  2:00 PM Liane Comber, NP [facility] None     HPI  70 y.o. female  presents for a complete physical. She has Anxiety; Major depressive disorder in partial  remission ([place]); IRRITABLE BOWEL SYNDROME; Allergic rhinitis; Vitamin D deficiency; Elevated blood pressure reading without diagnosis of hypertension; Other abnormal glucose (prediabetes); Mixed hyperlipidemia; Morbid obesity ([place]); GERD (gastroesophageal reflux disease); and Plantar fasciitis of left foot on their problem list.   She is married, she has 1 child and 2 grandkids, watches occasionally.  She is retired but still helps with polling in [employer]   She is following with Dr. Harrington Challenger for GYN every 2 years, saw 12/2018 and had mammogram and pelvic, has upcoming this year. Has hysterectomy sparing ovaries in 1992.   Her sister passed 01/2019 from brain tumor, mother passed 06/2019. There was family stress with her remaining sister, no longer speaking. She has hx of of depression/anxiety, now back on lexapro 20 mg daily and finds this helpful but still struggling. She is currently in counseling, "[facility]." She is currently prescribed xanax, uses mainly in the evening for sleep PRN to calm her mind down and rest if can't sleep with diphenhydramine, admits essentially daily use at this time. Have discussed addictive nature, suggested alternatives but declines at this time.   She has been having vague intermittent RLQ vs R groin pain/tenderness; struggles to characterize; denies constipation, fever/chills, bowel changes; incidentally not having at time of exam; she was evaluated at [facility] 04/10/2020 without significant findings.   BMI is Body mass index is 35.09 kg/m., she has been working on diet and exercise. No intentional exercise recently due to weather, but active around the home, chasing grandchildren; plans to start walking again with dog once weather improves. No ETOH. She has been prescribed phentermine/topamax for weight loss with benefit, but not currently taking.  Wt Readings from Last 3 Encounters:  04/20/20 217 lb 6.4 oz (98.6 kg)  03/09/20 222 lb (100.7 kg)  12/27/19 222 lb 6.4 oz  (100.9 kg)   Her blood pressure has been controlled at home, today their BP is BP: 124/82.  She does not workout. She denies chest pain, shortness of breath, dizziness.   She is on cholesterol medication (rosuvastatin 5 mg daily since last visit) and denies myalgias. Her cholesterol is not at goal of LDL <100. The cholesterol last visit was:  Lab Results  Component Value Date   CHOL 200 (H) 12/27/2019   HDL 37 (L) 12/27/2019   LDLCALC 124 (H) 12/27/2019   TRIG 243 (H) 12/27/2019   CHOLHDL 5.4 (H) 12/27/2019  . She hasn't been working on diet and exercise for hx of prediabetes (5.7% in 2015, 5.8% in 03/2018), she is on bASA, she is not on ACE/ARB and denies foot ulcerations, hyperglycemia, hypoglycemia , increased appetite, nausea, paresthesia of the feet, polydipsia, polyuria, visual disturbances, vomiting and weight loss. Last A1C in the office was:  Lab Results  Component Value Date   HGBA1C 5.7 (H) 12/27/2019    Last GFR:  Lab Results  Component Value Date   GFRNONAA 81 12/27/2019   Patient is on Vitamin D supplement, taking 10000 IU daily    Lab Results  Component Value Date   VD25OH 43 12/27/2019     Last GFR:  Lab Results  Component Value Date   GFRNONAA 81 12/27/2019       Current Medications:  Current Outpatient Medications on File Prior to Visit  Medication Sig Dispense Refill  . albuterol (  VENTOLIN HFA) 108 (90 Base) MCG/ACT inhaler Inhale 2 puffs into the lungs every 4 (four) hours as needed for wheezing or shortness of breath. Please give generic or the one that insurance covers 1 each 0  . ALPRAZolam (XANAX) 1 MG tablet TAKE 1/2 TO 1 TABLET BY MOUTH TWICE A DAY AS NEEDED IF NEEDED FOR ANXIETY ATTACK 90 tablet 0  . aspirin 81 MG tablet Take 81 mg by mouth daily.     . CHOLECALCIFEROL PO Take 5,000 Units by mouth daily.    Marland Kitchen escitalopram (LEXAPRO) 20 MG tablet TAKE ONE TABLET BY MOUTH DAILY 90 tablet 1  . fluticasone (FLONASE) 50 MCG/ACT nasal spray PLACE 2  SPRAYS INTO EACH NOSTRIL ONCE DAILY 48 mL 3  . ibuprofen (ADVIL) 800 MG tablet Take 1 tablet (800 mg total) by mouth every 8 (eight) hours as needed. 30 tablet 0  . MAGNESIUM CITRATE PO Take 400 mg by mouth daily.    . montelukast (SINGULAIR) 10 MG tablet Take    1 tablet     Daily       for Allergies 90 tablet 0  . Omega-3 Fatty Acids (OMEGA-3 FISH OIL PO) Take by mouth daily.    Marland Kitchen omeprazole (PRILOSEC) 40 MG capsule TAKE ONE CAPSULE BY MOUTH ONCE DAILY FOR INDIGESTION AND HEARTBURN. 90 capsule 1  . phentermine (ADIPEX-P) 37.5 MG tablet Take      1/2 to 1 tablet      every Morning      for Dieting & Weight Loss 90 tablet 1  . promethazine-dextromethorphan (PROMETHAZINE-DM) 6.25-15 MG/5ML syrup Take 5 mLs by mouth at bedtime as needed for cough. 100 mL 0  . rosuvastatin (CRESTOR) 5 MG tablet START BY TAKE 1 TABLET 3 NIGHTS A WEEK MON/WED/FRI. SLOWLY INCREASE AS TOLERATED TO DAILY (Patient taking differently: daily. START BY TAKE 1 TABLET 3 NIGHTS A WEEK MON/WED/FRI. SLOWLY INCREASE AS TOLERATED TO DAILY) 90 tablet 1  . topiramate (TOPAMAX) 50 MG tablet Take      1/2 to 1 tablet      2 x /day      at Suppertime & Bedtime      for Dieting & Weight Loss 180 tablet 1  . vitamin C (ASCORBIC ACID) 250 MG tablet Take 250 mg by mouth daily.    . Zinc 50 MG TABS Take by mouth.     No current facility-administered medications on file prior to visit.    Health Maintenance:   Immunization History  Administered Date(s) Administered  . Influenza, High Dose Seasonal PF 04/10/2018  . PFIZER(Purple Top)SARS-COV-2 Vaccination 07/06/2019, 07/27/2019  . Pneumococcal Conjugate-13 04/21/2019  . Tdap 08/27/2012    Tetanus: 2014 Flu vaccine: 12/2019 Pneumonia: DUE TODAY Prevnar 13: 04/2019 Shingrix: ask insurance  Covid 19: 2/2, pfizer 2021  LMP: Hysterectomy Pap: Hysterectomy 2012, sees GYN every other year, last pelvic 12/2018 MGM: 12/2018 at [place]: ordered, never scheduled, ask GYN   Colonoscopy:  2011, due 2021 wants to do cologuard Cologuard: 07/20/2019 neg CT AB 08/01/2016 Stress test 2014  Last eye exam: remote, no issues, uses [employer] readers Last dental exam: Dr. Woodfin Ganja, last visit 2020, overdue, reminded to schedule  Patient Care Team: Unk Pinto, MD as PCP - General (Internal Medicine) Rozetta Nunnery, MD as Consulting Physician (Otolaryngology) Nahser, Wonda Cheng, MD as Consulting Physician (Cardiology) Inda Castle, MD (Inactive) as Consulting Physician (Gastroenterology) Gus Height, MD (Inactive) as Consulting Physician (Obstetrics and Gynecology)  Medical History:  Past Medical  History:  Diagnosis Date  . Anxiety and depression   . Depression   . Diverticulitis   . DIVERTICULITIS 04/19/2009   Qualifier: Diagnosis of  By: Deatra Ina MD, Sandy Salaam   . Hyperlipidemia   . Hypertension   . Irritable bowel syndrome    Allergies Allergies  Allergen Reactions  . Atorvastatin Other (See Comments)    Leg cramps  . Levofloxacin   . Prednisone     SURGICAL HISTORY She  has a past surgical history that includes Tonsillectomy; Vaginal hysterectomy; Colon surgery (2012); Cholecystectomy (N/A, 12/17/2013); and Knee arthroscopy (Right, 2019). FAMILY HISTORY Her family history includes Brain cancer (age of onset: 77) in her sister; Cancer in her paternal aunt; Heart attack (age of onset: 84) in her brother; Heart attack (age of onset: 31) in her father; Heart disease in her brother, father, and mother; Hypertension in her mother; Irritable bowel syndrome in her sister; Osteoporosis in her mother; Throat cancer in her brother. SOCIAL HISTORY She  reports that she has never smoked. She has never used smokeless tobacco. She reports that she does not drink alcohol and does not use drugs.  Review of Systems: Review of Systems  Constitutional: Negative for chills, fever and malaise/fatigue.  HENT: Negative for congestion, ear pain and sore throat.   Eyes:  Negative.   Respiratory: Negative for cough, shortness of breath and wheezing.   Cardiovascular: Negative for chest pain, palpitations and leg swelling.  Gastrointestinal: Positive for abdominal pain (intermittent RLQ abdominal pain vs groin pain x 1 week). Negative for blood in stool, constipation, diarrhea, heartburn and melena.  Genitourinary: Negative.   Skin: Negative.   Neurological: Negative for dizziness, sensory change, loss of consciousness and headaches.  Psychiatric/Behavioral: Positive for depression. Negative for substance abuse and suicidal ideas. The patient is not nervous/anxious and does not have insomnia.     Physical Exam: Estimated body mass index is 35.09 kg/m as calculated from the following:   Height as of this encounter: 5\' 6"  (1.676 m).   Weight as of this encounter: 217 lb 6.4 oz (98.6 kg). BP 124/82   Pulse 93   Temp (!) 97.5 F (36.4 C)   Ht 5\' 6"  (1.676 m)   Wt 217 lb 6.4 oz (98.6 kg)   SpO2 97%   BMI 35.09 kg/m   General Appearance: Well nourished well developed, in no apparent distress.  Eyes: PERRLA, EOMs, conjunctiva no swelling or erythema ENT/Mouth: Ear canals normal without obstruction, swelling, erythema, or discharge.  TMs normal bilaterally with no erythema, bulging, retraction, or loss of landmark.  Oropharynx moist and clear with no exudate, erythema, or swelling.   Neck: Supple, thyroid normal. No bruits.  No cervical adenopathy Respiratory: Respiratory effort normal, Breath sounds clear A&P without wheeze, rhonchi, rales.   Cardio: RRR without murmurs, rubs or gallops. Brisk peripheral pulses without edema.  Chest: symmetric, with normal excursions Breasts: Defer to GYN Abdomen: Soft, nontender, no guarding, rebound, hernias, masses, or organomegaly.  Lymphatics: Non tender without lymphadenopathy.  Musculoskeletal: Full ROM all peripheral extremities,5/5 strength, and normal gait.  Skin: Warm, dry without rashes, lesions,  ecchymosis. Neuro: Awake and oriented X 3, Cranial nerves intact, reflexes equal bilaterally. Normal muscle tone, no cerebellar symptoms. Sensation intact.  Psych:  normal affect, Insight and Judgment appropriate.   EKG: WNL no ST changes  Over 40 minutes of exam, counseling, chart review and critical decision making was performed  [name] 2:26 PM [facility]

## 2020-04-20 ENCOUNTER — Ambulatory Visit (INDEPENDENT_AMBULATORY_CARE_PROVIDER_SITE_OTHER): Payer: Medicare HMO | Admitting: Adult Health

## 2020-04-20 ENCOUNTER — Encounter: Payer: Self-pay | Admitting: Adult Health

## 2020-04-20 ENCOUNTER — Other Ambulatory Visit: Payer: Self-pay

## 2020-04-20 VITALS — BP 124/82 | HR 93 | Temp 97.5°F | Ht 66.0 in | Wt 217.4 lb

## 2020-04-20 DIAGNOSIS — Z0001 Encounter for general adult medical examination with abnormal findings: Secondary | ICD-10-CM | POA: Diagnosis not present

## 2020-04-20 DIAGNOSIS — E782 Mixed hyperlipidemia: Secondary | ICD-10-CM | POA: Diagnosis not present

## 2020-04-20 DIAGNOSIS — Z23 Encounter for immunization: Secondary | ICD-10-CM | POA: Diagnosis not present

## 2020-04-20 DIAGNOSIS — K219 Gastro-esophageal reflux disease without esophagitis: Secondary | ICD-10-CM

## 2020-04-20 DIAGNOSIS — Z1389 Encounter for screening for other disorder: Secondary | ICD-10-CM | POA: Diagnosis not present

## 2020-04-20 DIAGNOSIS — R1031 Right lower quadrant pain: Secondary | ICD-10-CM

## 2020-04-20 DIAGNOSIS — I10 Essential (primary) hypertension: Secondary | ICD-10-CM

## 2020-04-20 DIAGNOSIS — R7309 Other abnormal glucose: Secondary | ICD-10-CM

## 2020-04-20 DIAGNOSIS — F3341 Major depressive disorder, recurrent, in partial remission: Secondary | ICD-10-CM

## 2020-04-20 DIAGNOSIS — R6889 Other general symptoms and signs: Secondary | ICD-10-CM

## 2020-04-20 DIAGNOSIS — R030 Elevated blood-pressure reading, without diagnosis of hypertension: Secondary | ICD-10-CM

## 2020-04-20 DIAGNOSIS — J301 Allergic rhinitis due to pollen: Secondary | ICD-10-CM

## 2020-04-20 DIAGNOSIS — E559 Vitamin D deficiency, unspecified: Secondary | ICD-10-CM | POA: Diagnosis not present

## 2020-04-20 DIAGNOSIS — F5102 Adjustment insomnia: Secondary | ICD-10-CM

## 2020-04-20 DIAGNOSIS — Z1329 Encounter for screening for other suspected endocrine disorder: Secondary | ICD-10-CM | POA: Diagnosis not present

## 2020-04-20 DIAGNOSIS — Z131 Encounter for screening for diabetes mellitus: Secondary | ICD-10-CM | POA: Diagnosis not present

## 2020-04-20 DIAGNOSIS — Z136 Encounter for screening for cardiovascular disorders: Secondary | ICD-10-CM

## 2020-04-20 DIAGNOSIS — Z0000 Encounter for general adult medical examination without abnormal findings: Secondary | ICD-10-CM | POA: Diagnosis not present

## 2020-04-20 DIAGNOSIS — R69 Illness, unspecified: Secondary | ICD-10-CM | POA: Diagnosis not present

## 2020-04-20 DIAGNOSIS — K589 Irritable bowel syndrome without diarrhea: Secondary | ICD-10-CM

## 2020-04-20 DIAGNOSIS — F419 Anxiety disorder, unspecified: Secondary | ICD-10-CM

## 2020-04-20 MED ORDER — ROSUVASTATIN CALCIUM 5 MG PO TABS: ORAL_TABLET | ORAL | 1 refills | Status: DC

## 2020-04-20 NOTE — Patient Instructions (Addendum)
Ms. Crocker , Thank you for taking time to come for your Annual Wellness Visit. I appreciate your ongoing commitment to your health goals. Please review the following plan we discussed and let me know if I can assist you in the future.   These are the goals we discussed: Goals    . DIET - INCREASE WATER INTAKE     65+ fluid ounces daily     . Exercise 150 min/wk Moderate Activity    . Weight (lb) < 200 lb (90.7 kg)       This is a list of the screening recommended for you and due dates:  Health Maintenance  Topic Date Due  . DEXA scan (bone density measurement)  Never done  . Mammogram  12/16/2019  . COVID-19 Vaccine (3 - Booster for Pfizer series) 01/27/2020  . Pneumonia vaccines (2 of 2 - PPSV23) 04/20/2020  . Cologuard (Stool DNA test)  07/20/2022  . Tetanus Vaccine  08/28/2022  .  Hepatitis C: One time screening is recommended by Center for Disease Control  (CDC) for  adults born from [birth year] through 1963.   Completed  . Flu Shot  Discontinued     Ask in surance if they cover shingrix - can get at [facility]      Zoster Vaccine, Recombinant injection What is this medicine? ZOSTER VACCINE (ZOS ter vak SEEN) is a vaccine used to reduce the risk of getting shingles. This vaccine is not used to treat shingles or nerve pain from shingles. This medicine may be used for other purposes; ask your health care provider or pharmacist if you have questions. COMMON BRAND NAME(S): [facility] What should I tell my health care provider before I take this medicine? They need to know if you have any of these conditions:  cancer  immune system problems  an unusual or allergic reaction to Zoster vaccine, other medications, foods, dyes, or preservatives  pregnant or trying to get pregnant  breast-feeding How should I use this medicine? This vaccine is injected into a muscle. It is given by a health care provider. A copy of Vaccine Information Statements will be given before each  vaccination. Be sure to read this information carefully each time. This sheet may change often. Talk to your health care provider about the use of this vaccine in children. This vaccine is not approved for use in children. Overdosage: If you think you have taken too much of this medicine contact a poison control center or emergency room at once. NOTE: This medicine is only for you. Do not share this medicine with others. What if I miss a dose? Keep appointments for follow-up (booster) doses. It is important not to miss your dose. Call your health care provider if you are unable to keep an appointment. What may interact with this medicine?  medicines that suppress your immune system  medicines to treat cancer  steroid medicines like prednisone or cortisone This list may not describe all possible interactions. Give your health care provider a list of all the medicines, herbs, non-prescription drugs, or dietary supplements you use. Also tell them if you smoke, drink alcohol, or use illegal drugs. Some items may interact with your medicine. What should I watch for while using this medicine? Visit your health care provider regularly. This vaccine, like all vaccines, may not fully protect everyone. What side effects may I notice from receiving this medicine? Side effects that you should report to your doctor or health care professional as soon as possible:  allergic reactions (skin rash, itching or hives; swelling of the face, lips, or tongue)  trouble breathing Side effects that usually do not require medical attention (report these to your doctor or health care professional if they continue or are bothersome):  chills  headache  fever  nausea  pain, redness, or irritation at site where injected  tiredness  vomiting This list may not describe all possible side effects. Call your doctor for medical advice about side effects. You may report side effects to FDA at 1-800-FDA-1088. Where  should I keep my medicine? This vaccine is only given by a health care provider. It will not be stored at home. NOTE: This sheet is a summary. It may not cover all possible information. If you have questions about this medicine, talk to your doctor, pharmacist, or health care provider.  2021 Elsevier/Gold Standard (2019-03-26 16:23:07)

## 2020-04-21 ENCOUNTER — Other Ambulatory Visit: Payer: Self-pay | Admitting: Adult Health

## 2020-04-21 LAB — LIPID PANEL
Cholesterol: 203 mg/dL — ABNORMAL HIGH (ref <200)
HDL: 36 mg/dL — ABNORMAL LOW (ref >=50)
LDL Cholesterol (Calc): 140 mg/dL (calc) — ABNORMAL HIGH
Non-HDL Cholesterol (Calc): 167 mg/dL (calc) — ABNORMAL HIGH (ref <130)
Total CHOL/HDL Ratio: 5.6 (calc) — ABNORMAL HIGH (ref <5.0)
Triglycerides: 145 mg/dL (ref <150)

## 2020-04-21 LAB — CBC WITH DIFFERENTIAL/PLATELET
Absolute Monocytes: 475 cells/uL (ref 200–950)
Basophils Absolute: 32 cells/uL (ref 0–200)
Basophils Relative: 0.6 %
Eosinophils Absolute: 119 cells/uL (ref 15–500)
Eosinophils Relative: 2.2 %
HCT: 38.2 % (ref 35.0–45.0)
Hemoglobin: 12.4 g/dL (ref 11.7–15.5)
Lymphs Abs: 1096 cells/uL (ref 850–3900)
MCH: 28.1 pg (ref 27.0–33.0)
MCHC: 32.5 g/dL (ref 32.0–36.0)
MCV: 86.4 fL (ref 80.0–100.0)
MPV: 11.2 fL (ref 7.5–12.5)
Monocytes Relative: 8.8 %
Neutro Abs: 3677 cells/uL (ref 1500–7800)
Neutrophils Relative %: 68.1 %
Platelets: 216 10*3/uL (ref 140–400)
RBC: 4.42 10*6/uL (ref 3.80–5.10)
RDW: 13.3 % (ref 11.0–15.0)
Total Lymphocyte: 20.3 %
WBC: 5.4 10*3/uL (ref 3.8–10.8)

## 2020-04-21 LAB — URINALYSIS, ROUTINE W REFLEX MICROSCOPIC
Bilirubin Urine: NEGATIVE
Glucose, UA: NEGATIVE
Hgb urine dipstick: NEGATIVE
Ketones, ur: NEGATIVE
Leukocytes,Ua: NEGATIVE
Nitrite: NEGATIVE
Protein, ur: NEGATIVE
Specific Gravity, Urine: 1.022 (ref 1.001–1.03)
pH: 6 (ref 5.0–8.0)

## 2020-04-21 LAB — COMPLETE METABOLIC PANEL WITH GFR
AG Ratio: 1.7 (calc) (ref 1.0–2.5)
ALT: 7 U/L (ref 6–29)
AST: 17 U/L (ref 10–35)
Albumin: 4.4 g/dL (ref 3.6–5.1)
Alkaline phosphatase (APISO): 120 U/L (ref 37–153)
BUN: 13 mg/dL (ref 7–25)
CO2: 29 mmol/L (ref 20–32)
Calcium: 9.7 mg/dL (ref 8.6–10.4)
Chloride: 105 mmol/L (ref 98–110)
Creat: 0.93 mg/dL (ref 0.50–0.99)
GFR, Est African American: 74 mL/min/{1.73_m2} (ref >=60)
GFR, Est Non African American: 64 mL/min/{1.73_m2} (ref >=60)
Globulin: 2.6 g/dL (calc) (ref 1.9–3.7)
Glucose, Bld: 91 mg/dL (ref 65–99)
Potassium: 4.5 mmol/L (ref 3.5–5.3)
Sodium: 141 mmol/L (ref 135–146)
Total Bilirubin: 0.5 mg/dL (ref 0.2–1.2)
Total Protein: 7 g/dL (ref 6.1–8.1)

## 2020-04-21 LAB — MICROALBUMIN / CREATININE URINE RATIO
Creatinine, Urine: 171 mg/dL (ref 20–275)
Microalb Creat Ratio: 8 mcg/mg creat (ref <30)
Microalb, Ur: 1.3 mg/dL

## 2020-04-21 LAB — HEMOGLOBIN A1C
Hgb A1c MFr Bld: 5.6 % of total Hgb (ref <5.7)
Mean Plasma Glucose: 114 mg/dL
eAG (mmol/L): 6.3 mmol/L

## 2020-04-21 LAB — TSH: TSH: 4.34 mIU/L (ref 0.40–4.50)

## 2020-04-21 LAB — MAGNESIUM: Magnesium: 2.2 mg/dL (ref 1.5–2.5)

## 2020-04-21 LAB — VITAMIN D 25 HYDROXY (VIT D DEFICIENCY, FRACTURES): Vit D, 25-Hydroxy: 61 ng/mL (ref 30–100)

## 2020-04-21 MED ORDER — EZETIMIBE 10 MG PO TABS: 10.0000 mg | ORAL_TABLET | Freq: Every day | ORAL | 1 refills | Status: DC

## 2020-05-12 ENCOUNTER — Other Ambulatory Visit: Payer: Self-pay | Admitting: Adult Health Nurse Practitioner

## 2020-05-12 ENCOUNTER — Other Ambulatory Visit: Payer: Self-pay | Admitting: Internal Medicine

## 2020-05-12 DIAGNOSIS — F411 Generalized anxiety disorder: Secondary | ICD-10-CM

## 2020-07-04 ENCOUNTER — Other Ambulatory Visit: Payer: Self-pay | Admitting: Adult Health Nurse Practitioner

## 2020-07-04 ENCOUNTER — Other Ambulatory Visit: Payer: Self-pay | Admitting: Internal Medicine

## 2020-07-04 DIAGNOSIS — F411 Generalized anxiety disorder: Secondary | ICD-10-CM

## 2020-08-10 ENCOUNTER — Other Ambulatory Visit: Payer: Self-pay | Admitting: Internal Medicine

## 2020-08-10 DIAGNOSIS — E669 Obesity, unspecified: Secondary | ICD-10-CM

## 2020-08-10 MED ORDER — PHENTERMINE HCL 37.5 MG PO TABS: ORAL_TABLET | ORAL | 1 refills | Status: DC

## 2020-08-30 ENCOUNTER — Other Ambulatory Visit: Payer: Self-pay

## 2020-08-30 ENCOUNTER — Encounter: Payer: Self-pay | Admitting: Adult Health

## 2020-08-30 ENCOUNTER — Ambulatory Visit (INDEPENDENT_AMBULATORY_CARE_PROVIDER_SITE_OTHER): Payer: Medicare HMO | Admitting: Adult Health

## 2020-08-30 VITALS — BP 124/72 | HR 98 | Temp 97.8°F | Resp 16 | Wt 212.8 lb

## 2020-08-30 DIAGNOSIS — D649 Anemia, unspecified: Secondary | ICD-10-CM | POA: Diagnosis not present

## 2020-08-30 DIAGNOSIS — R030 Elevated blood-pressure reading, without diagnosis of hypertension: Secondary | ICD-10-CM

## 2020-08-30 DIAGNOSIS — F419 Anxiety disorder, unspecified: Secondary | ICD-10-CM

## 2020-08-30 DIAGNOSIS — R6889 Other general symptoms and signs: Secondary | ICD-10-CM | POA: Diagnosis not present

## 2020-08-30 DIAGNOSIS — F5102 Adjustment insomnia: Secondary | ICD-10-CM

## 2020-08-30 DIAGNOSIS — F3341 Major depressive disorder, recurrent, in partial remission: Secondary | ICD-10-CM

## 2020-08-30 DIAGNOSIS — K589 Irritable bowel syndrome without diarrhea: Secondary | ICD-10-CM

## 2020-08-30 DIAGNOSIS — J301 Allergic rhinitis due to pollen: Secondary | ICD-10-CM

## 2020-08-30 DIAGNOSIS — E559 Vitamin D deficiency, unspecified: Secondary | ICD-10-CM

## 2020-08-30 DIAGNOSIS — R7309 Other abnormal glucose: Secondary | ICD-10-CM

## 2020-08-30 DIAGNOSIS — M722 Plantar fascial fibromatosis: Secondary | ICD-10-CM | POA: Diagnosis not present

## 2020-08-30 DIAGNOSIS — Z0000 Encounter for general adult medical examination without abnormal findings: Secondary | ICD-10-CM

## 2020-08-30 DIAGNOSIS — F411 Generalized anxiety disorder: Secondary | ICD-10-CM

## 2020-08-30 DIAGNOSIS — E782 Mixed hyperlipidemia: Secondary | ICD-10-CM | POA: Diagnosis not present

## 2020-08-30 DIAGNOSIS — K219 Gastro-esophageal reflux disease without esophagitis: Secondary | ICD-10-CM | POA: Diagnosis not present

## 2020-08-30 DIAGNOSIS — Z0001 Encounter for general adult medical examination with abnormal findings: Secondary | ICD-10-CM

## 2020-08-30 DIAGNOSIS — R69 Illness, unspecified: Secondary | ICD-10-CM | POA: Diagnosis not present

## 2020-08-30 MED ORDER — ROSUVASTATIN CALCIUM 5 MG PO TABS: ORAL_TABLET | ORAL | 1 refills | Status: DC

## 2020-08-30 MED ORDER — TRIAMCINOLONE ACETONIDE 0.5 % EX CREA: 1.0000 "application " | TOPICAL_CREAM | Freq: Two times a day (BID) | CUTANEOUS | 0 refills | Status: DC

## 2020-08-30 MED ORDER — ALPRAZOLAM 1 MG PO TABS
ORAL_TABLET | ORAL | 0 refills | Status: DC
Start: 2020-08-30 — End: 2020-10-30

## 2020-08-30 NOTE — Progress Notes (Signed)
MEDICARE WELLNESS  Assessment and Plan:   Annual Medicare Wellness Visit Due annually   Allergic rhinitis due to pollen, unspecified seasonality Continue allergy medication  Irritable bowel syndrome, unspecified type If not on benefiber then add it, decrease stress,  if any worsening symptoms, blood in stool, AB pain, etc call office  Elevated blood pressure reading without diagnosis of hypertension - continue medications, DASH diet, exercise and monitor at home. Call if greater than 130/80.  -     CBC with Differential/Platelet -     CMP/GFR -     Magnesium  Other abnormal glucose Discussed disease progression and risks Discussed diet/exercise, weight management and risk modification        -     CMP/GFR  Mixed hyperlipidemia -will try slow taper up on low dose rosuvatatin 5 mg, add zetia if needed - LDL goal <100, check lipids, decrease fatty foods, increase activity.  -     Lipid panel -     TSH  Vitamin D deficiency Continue supplement, defer check   Recurrent depression in partial remission /Anxiety state/ insomnia  Continue lexapro and counseling; extended discussion re: xanax daily use and benzo as daily sleep agent. She is working to slowly taper down.  Lifestyle discussed: diet/exerise, sleep hygiene, stress management, hydration  Morbid obesity ([place]) - BMI 36 with hld,  Long discussion about weight loss, diet, and exercise Recommended diet heavy in fruits and veggies and low in animal meats, cheeses, and dairy products, appropriate calorie intake Discussed appropriate weight for height  Follow up at next visit  Medication management -     Magnesium  Estrogen def DUE for DEXA screening, ordered last year but never scheduled She has GYN follow up this year, may discuss and order then Encouraged high calcium diet, vitamin D supplement as indicated   Orders Placed This Encounter  Procedures   COMPLETE METABOLIC PANEL WITH GFR   CBC with  Differential/Platelet   Magnesium   Lipid panel   TSH    Discussed med's effects and SE's. Screening labs and tests as requested with regular follow-up as recommended. Future Appointments  Date Time Provider [place]  12/18/2020  2:30 PM Unk Pinto, MD [facility] None  04/23/2021  2:00 PM Liane Comber, NP [facility] None  08/30/2021  2:00 PM Liane Comber, NP [facility] None    Plan:   During the course of the visit the patient was educated and counseled about appropriate screening and preventive services including:   Pneumococcal vaccine  Prevnar 13 Influenza vaccine Td vaccine Screening electrocardiogram Bone densitometry screening Colorectal cancer screening Diabetes screening Glaucoma screening Nutrition counseling  Advanced directives: requested   HPI  70 y.o. female  presents for AWV and 3 month follow up. She has Anxiety; Major depressive disorder in partial remission ([place]); IRRITABLE BOWEL SYNDROME; Allergic rhinitis; Vitamin D deficiency; Elevated blood pressure reading without diagnosis of hypertension; Other abnormal glucose (prediabetes); Mixed hyperlipidemia; Morbid obesity ([place]); GERD (gastroesophageal reflux disease); Plantar fasciitis of left foot; and Insomnia due to psychological stress on their problem list.     Her sister passed 01/2019 from brain tumor, mother passed 06/2019. There was family stress with her remaining sister, no longer speaking. She has hx of of depression/anxiety, now back on lexapro 20 mg daily and finds this helpful but still struggling. She is currently in counseling, "[facility]." She is currently prescribed xanax, uses mainly in the evening for sleep PRN to calm her mind down and rest if can't sleep with  diphenhydramine, admits essentially daily use at this time, but has tried to reduce and will do 1/2 tab only when able. Have discussed addictive nature, she is working to reduce. Has declined alternative sleep.   BMI is  Body mass index is 34.35 kg/m., she has been working on diet and exercise. No intentional exercise recently due to weather, but active around the home, chasing grandchildren; plans to start walking again with dog once weather improves. No ETOH. She has been prescribed phentermine/topamax for weight loss with benefit, she is down 5 lb since last visit. Taking intermittently.  Wt Readings from Last 3 Encounters:  08/30/20 212 lb 12.8 oz (96.5 kg)  04/20/20 217 lb 6.4 oz (98.6 kg)  03/09/20 222 lb (100.7 kg)   Her blood pressure has been controlled at home, today their BP is BP: 124/72.  She does not workout. She denies chest pain, shortness of breath, dizziness.   She is on cholesterol medication (rosuvastatin 5 mg daily caused myalgias, has stopped, never started low frequency taper, never picked up zetia). Her cholesterol is not at goal of LDL <100. The cholesterol last visit was:  Lab Results  Component Value Date   CHOL 203 (H) 04/20/2020   HDL 36 (L) 04/20/2020   LDLCALC 140 (H) 04/20/2020   TRIG 145 04/20/2020   CHOLHDL 5.6 (H) 04/20/2020  . She hasn't been working on diet and exercise for hx of prediabetes (5.7% in 2015, 5.8% in 03/2018), she is on bASA, she is not on ACE/ARB and denies foot ulcerations, hyperglycemia, hypoglycemia , increased appetite, nausea, paresthesia of the feet, polydipsia, polyuria, visual disturbances, vomiting and weight loss. Last A1C in the office was:  Lab Results  Component Value Date   HGBA1C 5.6 04/20/2020    Last GFR:  Lab Results  Component Value Date   GFRNONAA 64 04/20/2020   Patient is on Vitamin D supplement, taking 10000 IU daily    Lab Results  Component Value Date   VD25OH 61 04/20/2020       Current Medications:  Current Outpatient Medications on File Prior to Visit  Medication Sig Dispense Refill   albuterol (VENTOLIN HFA) 108 (90 Base) MCG/ACT inhaler Inhale 2 puffs into the lungs every 4 (four) hours as needed for wheezing or  shortness of breath. Please give generic or the one that insurance covers 1 each 0   aspirin 81 MG tablet Take 81 mg by mouth daily.      CHOLECALCIFEROL PO Take 10,000 Units by mouth daily.     escitalopram (LEXAPRO) 20 MG tablet TAKE ONE TABLET BY MOUTH DAILY 90 tablet 1   fluticasone (FLONASE) 50 MCG/ACT nasal spray PLACE 2 SPRAYS INTO EACH NOSTRIL ONCE DAILY 48 mL 3   ibuprofen (ADVIL) 800 MG tablet Take 1 tablet (800 mg total) by mouth every 8 (eight) hours as needed. 30 tablet 0   MAGNESIUM CITRATE PO Take 400 mg by mouth daily.     montelukast (SINGULAIR) 10 MG tablet TAKE 1 TABLET BY MOUTH DAILY FOR ALLERGIES 90 tablet 0   Omega-3 Fatty Acids (OMEGA-3 FISH OIL PO) Take by mouth daily.     omeprazole (PRILOSEC) 40 MG capsule TAKE ONE CAPSULE BY MOUTH ONCE DAILY FOR INDIGESTION AND HEARTBURN. 90 capsule 1   phentermine (ADIPEX-P) 37.5 MG tablet Take  1/2 to 1 tablet  every Morning  for Dieting & Weight Loss 90 tablet 1   topiramate (TOPAMAX) 50 MG tablet Take      1/2 to 1  tablet      2 x /day      at Suppertime & Bedtime      for Dieting & Weight Loss 180 tablet 1   vitamin C (ASCORBIC ACID) 250 MG tablet Take 250 mg by mouth daily.     Zinc 50 MG TABS Take by mouth.     No current facility-administered medications on file prior to visit.    Health Maintenance:   Immunization History  Administered Date(s) Administered   Influenza, High Dose Seasonal PF 04/10/2018   PFIZER(Purple Top)SARS-COV-2 Vaccination 07/06/2019, 07/27/2019   Pneumococcal Conjugate-13 04/21/2019   Pneumococcal Polysaccharide-23 04/20/2020   Tdap 08/27/2012    Tetanus: 2014 Flu vaccine: 12/2019 Pneumonia: 04/2020 Prevnar 13: 04/2019 Shingrix: ask insurance  Covid 19: 2/2, pfizer 2021  LMP: Hysterectomy Pap: Hysterectomy 2012, sees GYN every other year, last pelvic 12/2018 MGM: 12/2018 at GYN, has later this year DEXA: ordered, never scheduled, ask GYN   Colonoscopy: 2011, due 2021 wants to do  cologuard Cologuard: 07/20/2019 neg  Last eye exam: remote, no issues, uses [employer] [facility] Last dental exam: Dr. Woodfin Ganja, last visit 08/2020  Patient Care Team: Unk Pinto, MD as PCP - General (Internal Medicine) Rozetta Nunnery, MD as Consulting Physician (Otolaryngology) Nahser, Wonda Cheng, MD as Consulting Physician (Cardiology) Inda Castle, MD (Inactive) as Consulting Physician (Gastroenterology) Gus Height, MD (Inactive) as Consulting Physician (Obstetrics and Gynecology)  Medical History:  Past Medical History:  Diagnosis Date   Anxiety and depression    Depression    Diverticulitis    DIVERTICULITIS 04/19/2009   Qualifier: Diagnosis of  By: Deatra Ina MD, Sandy Salaam    Hyperlipidemia    Hypertension    Irritable bowel syndrome    Allergies Allergies  Allergen Reactions   Atorvastatin Other (See Comments)    Leg cramps   Levofloxacin    Prednisone     SURGICAL HISTORY She  has a past surgical history that includes Tonsillectomy; Vaginal hysterectomy; Colon surgery (2012); Cholecystectomy (N/A, 12/17/2013); and Knee arthroscopy (Right, 2019). FAMILY HISTORY Her family history includes Brain cancer (age of onset: 4) in her sister; Cancer in her paternal aunt; Heart attack (age of onset: 72) in her brother; Heart attack (age of onset: 58) in her father; Heart disease in her brother, father, and mother; Hypertension in her mother; Irritable bowel syndrome in her sister; Osteoporosis in her mother; Throat cancer in her brother. SOCIAL HISTORY She  reports that she has never smoked. She has never used smokeless tobacco. She reports that she does not drink alcohol and does not use drugs.  MEDICARE WELLNESS OBJECTIVES: Physical activity: Current Exercise Habits: Home exercise routine, Type of exercise: walking, Time (Minutes): 15, Frequency (Times/Week): 7, Weekly Exercise (Minutes/Week): 105, Intensity: Mild, Exercise limited by: None identified Cardiac risk  factors: Cardiac Risk Factors include: advanced age (>82men, >49 women);dyslipidemia;hypertension;obesity (BMI >30kg/m2) Depression/mood screen:   Depression screen [facility] 2/9 08/30/2020  Decreased Interest 0  Down, Depressed, Hopeless 1  PHQ - 2 Score 1  Altered sleeping -  Tired, decreased energy -  Change in appetite -  Feeling bad or failure about yourself  -  Trouble concentrating -  Moving slowly or fidgety/restless -  Suicidal thoughts -  PHQ-9 Score -  Difficult doing work/chores -    ADLs:  In your present state of health, do you have any difficulty performing the following activities: 08/30/2020 12/27/2019  Hearing? N N  Vision? N N  Difficulty concentrating or making decisions? N  N  Walking or climbing stairs? N N  Dressing or bathing? N N  Doing errands, shopping? N N  Some recent data might be hidden     Cognitive Testing  Alert? Yes  Normal Appearance?Yes  Oriented to person? Yes  Place? Yes   Time? Yes  Recall of three objects?  Yes  Can perform simple calculations? Yes  Displays appropriate judgment?Yes  Can read the correct time from a watch face?Yes  EOL planning: Does Patient Have a Medical Advance Directive?: No Would patient like information on creating a medical advance directive?: No - Patient declined      Review of Systems: Review of Systems  Constitutional:  Negative for chills, fever and malaise/fatigue.  HENT:  Negative for congestion, ear pain and sore throat.   Eyes: Negative.   Respiratory:  Negative for cough, shortness of breath and wheezing.   Cardiovascular:  Negative for chest pain, palpitations and leg swelling.  Gastrointestinal:  Positive for constipation (every other day, improves with miralax PRN). Negative for abdominal pain, blood in stool, diarrhea, heartburn and melena.  Genitourinary: Negative.   Skin: Negative.   Neurological:  Negative for dizziness, sensory change, loss of consciousness and headaches.   Psychiatric/Behavioral:  Negative for depression (improved), substance abuse and suicidal ideas. The patient has insomnia. The patient is not nervous/anxious.    Physical Exam: Estimated body mass index is 34.35 kg/m as calculated from the following:   Height as of 04/20/20: 5\' 6"  (1.676 m).   Weight as of this encounter: 212 lb 12.8 oz (96.5 kg). BP 124/72   Pulse 98   Temp 97.8 F (36.6 C)   Resp 16   Wt 212 lb 12.8 oz (96.5 kg)   SpO2 96%   BMI 34.35 kg/m   General Appearance: Well nourished well developed, in no apparent distress.  Eyes: PERRLA, EOMs, conjunctiva no swelling or erythema ENT/Mouth: Ear canals normal without obstruction, swelling, erythema, or discharge.  TMs normal bilaterally with no erythema, bulging, retraction, or loss of landmark.  Oropharynx moist and clear with no exudate, erythema, or swelling.   Neck: Supple, thyroid normal. No bruits.  No cervical adenopathy Respiratory: Respiratory effort normal, Breath sounds clear A&P without wheeze, rhonchi, rales.   Cardio: RRR without murmurs, rubs or gallops. Brisk peripheral pulses without edema.  Abdomen: Soft, nontender, no guarding, rebound, hernias, masses, or organomegaly.  Lymphatics: Non tender without lymphadenopathy.  Musculoskeletal: Full ROM all peripheral extremities,5/5 strength, and normal gait.  Skin: Warm, dry without rashes, lesions, ecchymosis. Neuro: Awake and oriented X 3, Cranial nerves intact, reflexes equal bilaterally. Normal muscle tone, no cerebellar symptoms. Sensation intact.  Psych:  normal affect, Insight and Judgment appropriate.    Medicare Attestation I have personally reviewed: The patient's medical and social history Their use of alcohol, tobacco or illicit drugs Their current medications and supplements The patient's functional ability including ADLs,fall risks, home safety risks, cognitive, and hearing and visual impairment Diet and physical activities Evidence for  depression or mood disorders  The patient's weight, height, BMI, and visual acuity have been recorded in the chart.  I have made referrals, counseling, and provided education to the patient based on review of the above and I have provided the patient with a written personalized care plan for preventive services.     Teresa Vasquez 2:41 PM [facility]

## 2020-08-30 NOTE — Patient Instructions (Signed)
   Ask insurance about shingrix coverage -    Zoster Vaccine, Recombinant injection What is this medication? ZOSTER VACCINE (ZOS ter vak SEEN) is a vaccine used to reduce the risk of getting shingles. This vaccine is not used to treat shingles or nerve pain fromshingles. This medicine may be used for other purposes; ask your health care provider orpharmacist if you have questions. COMMON BRAND NAME(S): [facility] What should I tell my care team before I take this medication? They need to know if you have any of these conditions: cancer immune system problems an unusual or allergic reaction to Zoster vaccine, other medications, foods, dyes, or preservatives pregnant or trying to get pregnant breast-feeding How should I use this medication? This vaccine is injected into a muscle. It is given by a health care provider. A copy of Vaccine Information Statements will be given before each vaccination. Be sure to read this information carefully each time. This sheet may changeoften. Talk to your health care provider about the use of this vaccine in children.This vaccine is not approved for use in children. Overdosage: If you think you have taken too much of this medicine contact apoison control center or emergency room at once. NOTE: This medicine is only for you. Do not share this medicine with others. What if I miss a dose? Keep appointments for follow-up (booster) doses. It is important not to miss your dose. Call your health care provider if you are unable to keep anappointment. What may interact with this medication? medicines that suppress your immune system medicines to treat cancer steroid medicines like prednisone or cortisone This list may not describe all possible interactions. Give your health care provider a list of all the medicines, herbs, non-prescription drugs, or dietary supplements you use. Also tell them if you smoke, drink alcohol, or use illegaldrugs. Some items may interact  with your medicine. What should I watch for while using this medication? Visit your health care provider regularly. This vaccine, like all vaccines, may not fully protect everyone. What side effects may I notice from receiving this medication? Side effects that you should report to your doctor or health care professionalas soon as possible: allergic reactions (skin rash, itching or hives; swelling of the face, lips, or tongue) trouble breathing Side effects that usually do not require medical attention (report these toyour doctor or health care professional if they continue or are bothersome): chills headache fever nausea pain, redness, or irritation at site where injected tiredness vomiting This list may not describe all possible side effects. Call your doctor for medical advice about side effects. You may report side effects to FDA at1-800-FDA-1088. Where should I keep my medication? This vaccine is only given by a health care provider. It will not be stored athome. NOTE: This sheet is a summary. It may not cover all possible information. If you have questions about this medicine, talk to your doctor, pharmacist, orhealth care provider.  2022 Elsevier/Gold Standard (2019-03-26 16:23:07)

## 2020-09-01 ENCOUNTER — Other Ambulatory Visit: Payer: Self-pay | Admitting: Adult Health

## 2020-09-01 ENCOUNTER — Encounter: Payer: Self-pay | Admitting: Adult Health

## 2020-09-01 DIAGNOSIS — D509 Iron deficiency anemia, unspecified: Secondary | ICD-10-CM | POA: Insufficient documentation

## 2020-09-01 LAB — COMPLETE METABOLIC PANEL WITH GFR
AG Ratio: 1.8 (calc) (ref 1.0–2.5)
ALT: 5 U/L — ABNORMAL LOW (ref 6–29)
AST: 13 U/L (ref 10–35)
Albumin: 4.3 g/dL (ref 3.6–5.1)
Alkaline phosphatase (APISO): 118 U/L (ref 37–153)
BUN: 13 mg/dL (ref 7–25)
CO2: 31 mmol/L (ref 20–32)
Calcium: 9.6 mg/dL (ref 8.6–10.4)
Chloride: 106 mmol/L (ref 98–110)
Creat: 0.8 mg/dL (ref 0.50–0.99)
GFR, Est African American: 88 mL/min/{1.73_m2} (ref >=60)
GFR, Est Non African American: 76 mL/min/{1.73_m2} (ref >=60)
Globulin: 2.4 g/dL (calc) (ref 1.9–3.7)
Glucose, Bld: 82 mg/dL (ref 65–99)
Potassium: 4.7 mmol/L (ref 3.5–5.3)
Sodium: 142 mmol/L (ref 135–146)
Total Bilirubin: 0.5 mg/dL (ref 0.2–1.2)
Total Protein: 6.7 g/dL (ref 6.1–8.1)

## 2020-09-01 LAB — CBC WITH DIFFERENTIAL/PLATELET
Absolute Monocytes: 431 cells/uL (ref 200–950)
Basophils Absolute: 29 cells/uL (ref 0–200)
Basophils Relative: 0.6 %
Eosinophils Absolute: 108 cells/uL (ref 15–500)
Eosinophils Relative: 2.2 %
HCT: 36.3 % (ref 35.0–45.0)
Hemoglobin: 11.6 g/dL — ABNORMAL LOW (ref 11.7–15.5)
Lymphs Abs: 990 cells/uL (ref 850–3900)
MCH: 27.2 pg (ref 27.0–33.0)
MCHC: 32 g/dL (ref 32.0–36.0)
MCV: 85 fL (ref 80.0–100.0)
MPV: 10.9 fL (ref 7.5–12.5)
Monocytes Relative: 8.8 %
Neutro Abs: 3342 cells/uL (ref 1500–7800)
Neutrophils Relative %: 68.2 %
Platelets: 224 10*3/uL (ref 140–400)
RBC: 4.27 10*6/uL (ref 3.80–5.10)
RDW: 13.8 % (ref 11.0–15.0)
Total Lymphocyte: 20.2 %
WBC: 4.9 10*3/uL (ref 3.8–10.8)

## 2020-09-01 LAB — TEST AUTHORIZATION

## 2020-09-01 LAB — IRON,TIBC AND FERRITIN PANEL
%SAT: 21 % (calc) (ref 16–45)
Ferritin: 9 ng/mL — ABNORMAL LOW (ref 16–288)
Iron: 70 ug/dL (ref 45–160)
TIBC: 338 mcg/dL (calc) (ref 250–450)

## 2020-09-01 LAB — LIPID PANEL
Cholesterol: 265 mg/dL — ABNORMAL HIGH (ref <200)
HDL: 34 mg/dL — ABNORMAL LOW (ref >=50)
LDL Cholesterol (Calc): 195 mg/dL (calc) — ABNORMAL HIGH
Non-HDL Cholesterol (Calc): 231 mg/dL (calc) — ABNORMAL HIGH (ref <130)
Total CHOL/HDL Ratio: 7.8 (calc) — ABNORMAL HIGH (ref <5.0)
Triglycerides: 185 mg/dL — ABNORMAL HIGH (ref <150)

## 2020-09-01 LAB — TSH: TSH: 3.07 mIU/L (ref 0.40–4.50)

## 2020-09-01 LAB — MAGNESIUM: Magnesium: 2.3 mg/dL (ref 1.5–2.5)

## 2020-09-20 ENCOUNTER — Other Ambulatory Visit: Payer: Self-pay | Admitting: Internal Medicine

## 2020-09-20 DIAGNOSIS — E669 Obesity, unspecified: Secondary | ICD-10-CM

## 2020-10-10 ENCOUNTER — Telehealth: Payer: Self-pay | Admitting: Internal Medicine

## 2020-10-10 NOTE — Telephone Encounter (Signed)
patient called to request DEXA order to [facility] office. Patient is scheduling screening mammogram and Dexa with  Dr..Robrert Stann Mainland, obgyn.  Advised patient Dr. Stann Mainland would order DEXA, if her preference is to have DEXA at [facility] or [facility] we can send the order. Patient will call back if any needed.

## 2020-10-16 ENCOUNTER — Other Ambulatory Visit: Payer: Self-pay | Admitting: Adult Health

## 2020-10-16 DIAGNOSIS — H524 Presbyopia: Secondary | ICD-10-CM | POA: Diagnosis not present

## 2020-10-16 DIAGNOSIS — Z0100 Encounter for examination of eyes and vision without abnormal findings: Secondary | ICD-10-CM | POA: Diagnosis not present

## 2020-10-29 ENCOUNTER — Other Ambulatory Visit: Payer: Self-pay | Admitting: Adult Health

## 2020-10-29 DIAGNOSIS — F411 Generalized anxiety disorder: Secondary | ICD-10-CM

## 2020-11-13 ENCOUNTER — Other Ambulatory Visit: Payer: Self-pay

## 2020-11-13 DIAGNOSIS — D509 Iron deficiency anemia, unspecified: Secondary | ICD-10-CM

## 2020-11-13 LAB — POC HEMOCCULT BLD/STL (HOME/3-CARD/SCREEN)
Card #2 Fecal Occult Blod, POC: NEGATIVE
Card #3 Fecal Occult Blood, POC: NEGATIVE
Fecal Occult Blood, POC: NEGATIVE

## 2020-11-15 DIAGNOSIS — Z1211 Encounter for screening for malignant neoplasm of colon: Secondary | ICD-10-CM | POA: Diagnosis not present

## 2020-11-15 DIAGNOSIS — Z1212 Encounter for screening for malignant neoplasm of rectum: Secondary | ICD-10-CM | POA: Diagnosis not present

## 2020-11-27 ENCOUNTER — Telehealth: Payer: Self-pay

## 2020-11-27 NOTE — Telephone Encounter (Signed)
Patient is requesting an order for her to have a Bone Density test.

## 2020-11-28 ENCOUNTER — Other Ambulatory Visit: Payer: Self-pay | Admitting: Adult Health

## 2020-11-28 DIAGNOSIS — E2839 Other primary ovarian failure: Secondary | ICD-10-CM

## 2020-12-17 ENCOUNTER — Encounter: Payer: Self-pay | Admitting: Internal Medicine

## 2020-12-17 MED ORDER — VITAMIN D3 125 MCG (5000 UT) PO CAPS: ORAL_CAPSULE | ORAL | Status: AC

## 2020-12-17 NOTE — Patient Instructions (Signed)

## 2020-12-17 NOTE — Progress Notes (Signed)
Future Appointments  Date Time Provider [place]  12/18/2020  2:30 PM Unk Pinto, MD [facility] None  04/24/2021     -   CPE   2:00 PM Magda Bernheim, NP [facility] None  08/30/2021     -   Wellness  2:00 PM Liane Comber, NP [facility] None    History of Present Illness:       This very nice 70 y.o. MWF presents for 3 month follow up with HTN, HLD, Pre-Diabetes and Vitamin D Deficiency.        Patient is followed expectantly with hx/o  labile HTN & BP has been controlled at home. Today's BP is at goal - 118/78. Patient has had no complaints of any cardiac type chest pain, palpitations, dyspnea / orthopnea / PND, dizziness, claudication, or dependent edema.       Patient is statin intolerant and Hyperlipidemia is controlled with diet & meds. Patient denies myalgias or other med SE's. Last Lipids were not at goal :  Lab Results  Component Value Date   CHOL 265 (H) 08/30/2020   HDL 34 (L) 08/30/2020   LDLCALC 195 (H) 08/30/2020   TRIG 185 (H) 08/30/2020   CHOLHDL 7.8 (H) 08/30/2020     Also, the patient has Moderate Obesity  (BMI 34.8+) & is followed expectantly for Glucose intolerance  (A1c 5.7% /2016 and A1c 5.8% /Jan 2020) and has had no symptoms of reactive hypoglycemia, diabetic polys, paresthesias or visual blurring.  Last A1c was normal & at goal:  Lab Results  Component Value Date   HGBA1C 5.6 04/20/2020                                                         Further, the patient also has history of Vitamin D Deficiency ("44" /2015) and supplements vitamin D without any suspected side-effects. Last vitamin D was at goal :  Lab Results  Component Value Date   VD25OH 61 04/20/2020     Current Outpatient Medications on File Prior to Visit  Medication Sig   albuterol HFA inhaler Inhale 2 puffs  every 4 ) hours as needed    ALPRAZolam  1 MG tablet TAKE 1/2 TO 1 TABLET TWICE A DAY AS NEEDED IF NEEDED FOR ANXIETY ATTACK   aspirin 81 MG tablet Take daily.     Vitamin D  Take 10,000 Units daily.   escitalopram  20 MG tablet TAKE ONE TABLET DAILY   FLONASE  nasal spray PLACE 2 SPRAYS INTO EACH NOSTRIL DAILY   Ibuprofen  800 MG tablet Take 1 tablet every 8 hours as needed.   MAGNESIUM  Take 400 mg  daily.   montelukast 10 MG tablet TAKE 1 TABLET  DAILY    OMEGA-3 FISH OIL  Take daily.   omeprazole 40 MG capsule Take 1 capsule Daily    phentermine 37.5 MG tablet Take  1/2 to 1 tablet  every Morning    rosuvastatin  5 MG tablet TAKING 1 TAB 3 DAYS A WEEK.   topiramate 50 MG tablet TAKE 1/2 TO 1 TABLET 2 X /DAY WEIGHT LOSS   triamcinolone cream  0.5 % Apply  2 times daily.   vitamin C  250 MG tablet Take  daily.   Zinc 50 MG TABS Take DAILY  Allergies  Allergen Reactions   Atorvastatin Other (See Comments)    Leg cramps   Levofloxacin    Prednisone      PMHx:   Past Medical History:  Diagnosis Date   Anxiety and depression    Depression    Diverticulitis    DIVERTICULITIS 04/19/2009   Hyperlipidemia    Hypertension    Irritable bowel syndrome      Immunization History  Administered Date(s) Administered   Influenza, High Dose  04/10/2018   PFIZER  SARS-COV-2 Vacc 07/06/2019, 07/27/2019   Pneumococcal -13 04/21/2019   Pneumococcal -23 04/20/2020   Tdap 08/27/2012     Past Surgical History:  Procedure Laterality Date   CHOLECYSTECTOMY N/A 12/17/2013   LAPAROSCOPIC CHOLECYSTECTOMY - Fanny Skates, MD;     COLON SURGERY  2012   Diverticulosis   KNEE ARTHROSCOPY Right 2019   x 2   TONSILLECTOMY     VAGINAL HYSTERECTOMY      FHx:    Reviewed / unchanged  SHx:    Reviewed / unchanged   Systems Review:  Constitutional: Denies fever, chills, wt changes, headaches, insomnia, fatigue, night sweats, change in appetite. Eyes: Denies redness, blurred vision, diplopia, discharge, itchy, watery eyes.  ENT: Denies discharge, congestion, post nasal drip, epistaxis, sore throat, earache, hearing loss, dental pain,  tinnitus, vertigo, sinus pain, snoring.  CV: Denies chest pain, palpitations, irregular heartbeat, syncope, dyspnea, diaphoresis, orthopnea, PND, claudication or edema. Respiratory: denies cough, dyspnea, DOE, pleurisy, hoarseness, laryngitis, wheezing.  Gastrointestinal: Denies dysphagia, odynophagia, heartburn, reflux, water brash, abdominal pain or cramps, nausea, vomiting, bloating, diarrhea, constipation, hematemesis, melena, hematochezia  or hemorrhoids. Genitourinary: Denies dysuria, frequency, urgency, nocturia, hesitancy, discharge, hematuria or flank pain. Musculoskeletal: Denies arthralgias, myalgias, stiffness, jt. swelling, pain, limping or strain/sprain.  Skin: Denies pruritus, rash, hives, warts, acne, eczema or change in skin lesion(s). Neuro: No weakness, tremor, incoordination, spasms, paresthesia or pain. Psychiatric: Denies confusion, memory loss or sensory loss. Endo: Denies change in weight, skin or hair change.  Heme/Lymph: No excessive bleeding, bruising or enlarged lymph nodes.  Physical Exam  BP 118/78   Pulse 90   Temp (!) 97.3 F (36.3 C)   Resp 17   Ht 5\' 6"  (1.676 m)   Wt 212 lb 12.8 oz (96.5 kg)   SpO2 96%   BMI 34.35 kg/m   Appears  well nourished, well groomed  and in no distress.  Eyes: PERRLA, EOMs, conjunctiva no swelling or erythema. Sinuses: No frontal/maxillary tenderness ENT/Mouth: EAC's clear, TM's nl w/o erythema, bulging. Nares clear w/o erythema, swelling, exudates. Oropharynx clear without erythema or exudates. Oral hygiene is good. Tongue normal, non obstructing. Hearing intact.  Neck: Supple. Thyroid not palpable. Car 2+/2+ without bruits, nodes or JVD. Chest: Respirations nl with BS clear & equal w/o rales, rhonchi, wheezing or stridor.  Cor: Heart sounds normal w/ regular rate and rhythm without sig. murmurs, gallops, clicks or rubs. Peripheral pulses normal and equal  without edema.  Abdomen: Soft & bowel sounds normal. Non-tender w/o  guarding, rebound, hernias, masses or organomegaly.  Lymphatics: Unremarkable.  Musculoskeletal: Full ROM all peripheral extremities, joint stability, 5/5 strength and normal gait.  Skin: Warm, dry without exposed rashes, lesions or ecchymosis apparent.  Neuro: Cranial nerves intact, reflexes equal bilaterally. Sensory-motor testing grossly intact. Tendon reflexes grossly intact.  Pysch: Alert & oriented x 3.  Insight and judgement nl & appropriate. No ideations.  Assessment and Plan:  1. Labile hypertension  - Continue medication, monitor blood pressure  at home.  - Continue DASH diet.  Reminder to go to the ER if any CP,  SOB, nausea, dizziness, severe HA, changes vision/speech.   - CBC with Differential/Platelet - COMPLETE METABOLIC PANEL WITH GFR - Magnesium - TSH  2. Hyperlipidemia, mixed  - Continue diet/meds, exercise,& lifestyle modifications.  - Continue monitor periodic cholesterol/liver & renal functions    - Lipid panel  3. Statin intolerance   4. Abnormal glucose  - Continue diet, exercise  - Lifestyle modifications.  - Monitor appropriate labs    - Hemoglobin A1c - Insulin, random  5. Vitamin D deficiency  - Continue supplementation   6. Medication management  - CBC with Differential/Platelet - COMPLETE METABOLIC PANEL WITH GFR - Magnesium - Lipid panel - TSH - Hemoglobin A1c - Insulin, random - VITAMIN D 25 Hydroxy         Discussed  regular exercise, BP monitoring, weight control to achieve/maintain BMI less than 25 and discussed med and SE's. Recommended labs to assess and monitor clinical status with further disposition pending results of labs.  I discussed the assessment and treatment plan with the patient. The patient was provided an opportunity to ask questions and all were answered. The patient agreed with the plan and demonstrated an understanding of the instructions.  I provided over 30 minutes of exam, counseling, chart review and  complex  critical decision making.        The patient was advised to call back or seek an in-person evaluation if the symptoms worsen or if the condition fails to improve as anticipated.   Kirtland Bouchard, MD

## 2020-12-18 ENCOUNTER — Ambulatory Visit (INDEPENDENT_AMBULATORY_CARE_PROVIDER_SITE_OTHER): Payer: Medicare HMO | Admitting: Internal Medicine

## 2020-12-18 ENCOUNTER — Encounter: Payer: Self-pay | Admitting: Internal Medicine

## 2020-12-18 ENCOUNTER — Other Ambulatory Visit: Payer: Self-pay

## 2020-12-18 VITALS — BP 118/78 | HR 90 | Temp 97.3°F | Resp 17 | Ht 66.0 in | Wt 212.8 lb

## 2020-12-18 DIAGNOSIS — Z79899 Other long term (current) drug therapy: Secondary | ICD-10-CM

## 2020-12-18 DIAGNOSIS — R0989 Other specified symptoms and signs involving the circulatory and respiratory systems: Secondary | ICD-10-CM

## 2020-12-18 DIAGNOSIS — Z789 Other specified health status: Secondary | ICD-10-CM | POA: Diagnosis not present

## 2020-12-18 DIAGNOSIS — E782 Mixed hyperlipidemia: Secondary | ICD-10-CM | POA: Diagnosis not present

## 2020-12-18 DIAGNOSIS — E559 Vitamin D deficiency, unspecified: Secondary | ICD-10-CM

## 2020-12-18 DIAGNOSIS — R7309 Other abnormal glucose: Secondary | ICD-10-CM

## 2020-12-19 LAB — CBC WITH DIFFERENTIAL/PLATELET
Absolute Monocytes: 400 cells/uL (ref 200–950)
Basophils Absolute: 48 cells/uL (ref 0–200)
Basophils Relative: 1.1 %
Eosinophils Absolute: 158 cells/uL (ref 15–500)
Eosinophils Relative: 3.6 %
HCT: 38.4 % (ref 35.0–45.0)
Hemoglobin: 12.7 g/dL (ref 11.7–15.5)
Lymphs Abs: 999 cells/uL (ref 850–3900)
MCH: 29.3 pg (ref 27.0–33.0)
MCHC: 33.1 g/dL (ref 32.0–36.0)
MCV: 88.5 fL (ref 80.0–100.0)
MPV: 11 fL (ref 7.5–12.5)
Monocytes Relative: 9.1 %
Neutro Abs: 2794 cells/uL (ref 1500–7800)
Neutrophils Relative %: 63.5 %
Platelets: 198 10*3/uL (ref 140–400)
RBC: 4.34 10*6/uL (ref 3.80–5.10)
RDW: 13.3 % (ref 11.0–15.0)
Total Lymphocyte: 22.7 %
WBC: 4.4 10*3/uL (ref 3.8–10.8)

## 2020-12-19 LAB — COMPLETE METABOLIC PANEL WITH GFR
AG Ratio: 1.8 (calc) (ref 1.0–2.5)
ALT: 5 U/L — ABNORMAL LOW (ref 6–29)
AST: 12 U/L (ref 10–35)
Albumin: 4.3 g/dL (ref 3.6–5.1)
Alkaline phosphatase (APISO): 119 U/L (ref 37–153)
BUN: 13 mg/dL (ref 7–25)
CO2: 26 mmol/L (ref 20–32)
Calcium: 9.4 mg/dL (ref 8.6–10.4)
Chloride: 108 mmol/L (ref 98–110)
Creat: 0.76 mg/dL (ref 0.50–1.05)
Globulin: 2.4 g/dL (calc) (ref 1.9–3.7)
Glucose, Bld: 94 mg/dL (ref 65–99)
Potassium: 4.5 mmol/L (ref 3.5–5.3)
Sodium: 143 mmol/L (ref 135–146)
Total Bilirubin: 0.5 mg/dL (ref 0.2–1.2)
Total Protein: 6.7 g/dL (ref 6.1–8.1)
eGFR: 85 mL/min/{1.73_m2} (ref >=60)

## 2020-12-19 LAB — LIPID PANEL
Cholesterol: 205 mg/dL — ABNORMAL HIGH (ref <200)
HDL: 38 mg/dL — ABNORMAL LOW (ref >=50)
LDL Cholesterol (Calc): 141 mg/dL (calc) — ABNORMAL HIGH
Non-HDL Cholesterol (Calc): 167 mg/dL (calc) — ABNORMAL HIGH (ref <130)
Total CHOL/HDL Ratio: 5.4 (calc) — ABNORMAL HIGH (ref <5.0)
Triglycerides: 131 mg/dL (ref <150)

## 2020-12-19 LAB — INSULIN, RANDOM: Insulin: 16.6 u[IU]/mL

## 2020-12-19 LAB — VITAMIN D 25 HYDROXY (VIT D DEFICIENCY, FRACTURES): Vit D, 25-Hydroxy: 51 ng/mL (ref 30–100)

## 2020-12-19 LAB — HEMOGLOBIN A1C
Hgb A1c MFr Bld: 5.5 % of total Hgb (ref <5.7)
Mean Plasma Glucose: 111 mg/dL
eAG (mmol/L): 6.2 mmol/L

## 2020-12-19 LAB — MAGNESIUM: Magnesium: 1.9 mg/dL (ref 1.5–2.5)

## 2020-12-19 LAB — TSH: TSH: 3.83 mIU/L (ref 0.40–4.50)

## 2020-12-19 NOTE — Progress Notes (Signed)
============================================================ -   Test results slightly outside the reference range are not unusual. If there is anything important, I will review this with you,  otherwise it is considered normal test values.  If you have further questions,  please do not hesitate to contact me at the office or via My Chart.  ============================================================ ============================================================  -  Total Chol = 205 - Elevated                   (  Ideal or Goal is less than 180  !  )   - and   - Bad / Dangerous LDL Chol = 141   - Very Elevated            (  Ideal or Goal is less than 70  !  )   - Recommend increase your Rosuvastatin 5 mg from 3 x /week up to                                                                                      take 1 tablet Every day    - If not able to tolerate, then recommend referral to the [facility] to   see one of the Cardiologists that specializes in Cholesterol management  &   may be able to ger you on a different regimen ============================================================ ============================================================  -  A1c is back in Normal non-Diabetic range - Wonderful ! ============================================================ ============================================================  -  Vitamin D = 51 - sl low   - Vitamin D goal is between 70-100.   - Please make sure that you are taking your Vit D  (10,000 u/day) as directed.   - It is very important as a natural anti-inflammatory and helping the  immune system protect against viral infections, like the Covid-19    helping hair, skin, and nails, as well as reducing stroke and  heart attack risk.   - It helps your bones and helps with mood.  - It also decreases numerous cancer risks so please  take it as directed.   - Low Vit D is associated with a 200-300% higher  risk for  CANCER   and 200-300% higher risk for HEART   ATTACK  &  STROKE.    - It is also associated with higher death rate at younger ages,   autoimmune diseases like Rheumatoid arthritis, Lupus,  Multiple Sclerosis.     - Also many other serious conditions, like depression, Alzheimer's  Dementia, infertility, muscle aches, fatigue, fibromyalgia   - just to name a few. ============================================================ ============================================================  -  All Else - CBC - Kidneys - Electrolytes - Liver - Magnesium & Thyroid    - all  Normal / OK ============================================================ ============================================================

## 2020-12-21 ENCOUNTER — Other Ambulatory Visit: Payer: BC Managed Care – PPO

## 2020-12-27 ENCOUNTER — Other Ambulatory Visit: Payer: Self-pay | Admitting: Adult Health

## 2020-12-27 DIAGNOSIS — F411 Generalized anxiety disorder: Secondary | ICD-10-CM

## 2021-01-04 ENCOUNTER — Ambulatory Visit
Admission: RE | Admit: 2021-01-04 | Discharge: 2021-01-04 | Disposition: A | Discharge status: Home / Self Care | Payer: Medicare HMO | Source: Ambulatory Visit | Attending: Adult Health | Admitting: Adult Health

## 2021-01-04 DIAGNOSIS — E2839 Other primary ovarian failure: Secondary | ICD-10-CM

## 2021-01-04 DIAGNOSIS — M8589 Other specified disorders of bone density and structure, multiple sites: Secondary | ICD-10-CM | POA: Diagnosis not present

## 2021-01-04 DIAGNOSIS — Z780 Asymptomatic menopausal state: Secondary | ICD-10-CM | POA: Diagnosis not present

## 2021-01-05 ENCOUNTER — Encounter: Payer: Self-pay | Admitting: Adult Health

## 2021-01-05 DIAGNOSIS — M8580 Other specified disorders of bone density and structure, unspecified site: Secondary | ICD-10-CM | POA: Insufficient documentation

## 2021-01-14 ENCOUNTER — Other Ambulatory Visit: Payer: Self-pay | Admitting: Adult Health Nurse Practitioner

## 2021-01-26 ENCOUNTER — Other Ambulatory Visit: Payer: Self-pay | Admitting: Internal Medicine

## 2021-01-26 ENCOUNTER — Other Ambulatory Visit: Payer: Self-pay | Admitting: Adult Health

## 2021-02-01 DIAGNOSIS — Z01419 Encounter for gynecological examination (general) (routine) without abnormal findings: Secondary | ICD-10-CM | POA: Diagnosis not present

## 2021-02-01 DIAGNOSIS — R8279 Other abnormal findings on microbiological examination of urine: Secondary | ICD-10-CM | POA: Diagnosis not present

## 2021-02-01 DIAGNOSIS — Z6833 Body mass index (BMI) 33.0-33.9, adult: Secondary | ICD-10-CM | POA: Diagnosis not present

## 2021-02-14 DIAGNOSIS — Z1231 Encounter for screening mammogram for malignant neoplasm of breast: Secondary | ICD-10-CM | POA: Diagnosis not present

## 2021-02-14 LAB — HM MAMMOGRAPHY

## 2021-02-23 ENCOUNTER — Other Ambulatory Visit: Payer: Self-pay | Admitting: Adult Health

## 2021-02-23 DIAGNOSIS — F411 Generalized anxiety disorder: Secondary | ICD-10-CM

## 2021-02-26 ENCOUNTER — Other Ambulatory Visit: Payer: Self-pay

## 2021-02-26 ENCOUNTER — Ambulatory Visit (HOSPITAL_COMMUNITY)
Admission: EM | Admit: 2021-02-26 | Discharge: 2021-02-26 | Disposition: A | Discharge status: Home / Self Care | Payer: Medicare HMO | Attending: Student | Admitting: Student

## 2021-02-26 ENCOUNTER — Encounter (HOSPITAL_COMMUNITY): Payer: Self-pay

## 2021-02-26 ENCOUNTER — Ambulatory Visit (HOSPITAL_COMMUNITY): Payer: Self-pay

## 2021-02-26 DIAGNOSIS — J069 Acute upper respiratory infection, unspecified: Secondary | ICD-10-CM | POA: Diagnosis not present

## 2021-02-26 DIAGNOSIS — N760 Acute vaginitis: Secondary | ICD-10-CM | POA: Diagnosis not present

## 2021-02-26 DIAGNOSIS — J0100 Acute maxillary sinusitis, unspecified: Secondary | ICD-10-CM | POA: Diagnosis not present

## 2021-02-26 DIAGNOSIS — S39012A Strain of muscle, fascia and tendon of lower back, initial encounter: Secondary | ICD-10-CM | POA: Insufficient documentation

## 2021-02-26 LAB — POCT URINALYSIS DIPSTICK, ED / UC
Bilirubin Urine: NEGATIVE
Glucose, UA: NEGATIVE mg/dL
Hgb urine dipstick: NEGATIVE
Ketones, ur: NEGATIVE mg/dL
Leukocytes,Ua: NEGATIVE
Nitrite: NEGATIVE
Protein, ur: NEGATIVE mg/dL
Specific Gravity, Urine: 1.015 (ref 1.005–1.030)
Urobilinogen, UA: 0.2 mg/dL (ref 0.0–1.0)
pH: 6.5 (ref 5.0–8.0)

## 2021-02-26 MED ORDER — PROMETHAZINE-DM 6.25-15 MG/5ML PO SYRP: 5.0000 mL | ORAL_SOLUTION | Freq: Four times a day (QID) | ORAL | 0 refills | Status: DC | PRN

## 2021-02-26 MED ORDER — AMOXICILLIN-POT CLAVULANATE 875-125 MG PO TABS: 1.0000 | ORAL_TABLET | Freq: Two times a day (BID) | ORAL | 0 refills | Status: DC

## 2021-02-26 MED ORDER — CEPHALEXIN 500 MG PO CAPS: 500.0000 mg | ORAL_CAPSULE | Freq: Four times a day (QID) | ORAL | 0 refills | Status: DC

## 2021-02-26 MED ORDER — ALBUTEROL SULFATE HFA 108 (90 BASE) MCG/ACT IN AERS: 1.0000 | INHALATION_SPRAY | Freq: Four times a day (QID) | RESPIRATORY_TRACT | 0 refills | Status: DC | PRN

## 2021-02-26 NOTE — ED Provider Notes (Signed)
[facility]    CSN: [id number] Arrival date & time: 02/26/21  1152      History   Chief Complaint Chief Complaint  Patient presents with   Facial Pain   Flank Pain    R    HPI Teresa Vasquez is a 70 y.o. female presenting with both sinus pain following a virus and lower back pain with urinary frequency.  Medical history diverticulitis.  States that she saw her primary care provider few weeks ago, they saw bacteria on her urine and also thought she had a yeast infection though did not do a swab.  She was treated with Diflucan and Keflex, she did not finish the Keflex due to nausea on this.  States that her symptoms did improve, but now she is having urinary frequency .  She does note that she drinks a lot of water and tea.  Crampy lower abdominal pain right worse than left.  Denies change in bowel function including constipation, diarrhea, melena, hematochezia.  She also notes right-sided lower back pain, initially this was bilateral lower back pain.  Pain is worse with movement.  Endorses viral URI 1 week ago, with continued nonproductive cough since then.  Now with facial pain and purulent nasal drainage.  Thick postnasal drip that seems to be causing some nausea without vomiting.  Denies shortness of breath, chest pain, dizziness.  Denies history of pulmonary disease, but has used albuterol inhaler during past bronchitis with some relief.   HPI  Past Medical History:  Diagnosis Date   Anxiety and depression    Depression    Diverticulitis    DIVERTICULITIS 04/19/2009   Qualifier: Diagnosis of  By: Deatra Ina MD, Sandy Salaam    Hyperlipidemia    Hypertension    Irritable bowel syndrome     Patient Active Problem List   Diagnosis Date Noted   Osteopenia 01/05/2021   Iron deficiency anemia 09/01/2020   Insomnia due to psychological stress 04/20/2020   Plantar fasciitis of left foot 06/16/2018   GERD (gastroesophageal reflux disease) 03/06/2018   Morbid obesity ([place])  08/27/2017   Allergic rhinitis 03/14/2013   Vitamin D deficiency 03/14/2013   Elevated blood pressure reading without diagnosis of hypertension 03/14/2013   Other abnormal glucose (prediabetes) 03/14/2013   Mixed hyperlipidemia 03/14/2013   Anxiety 04/13/2009   Major depressive disorder in partial remission ([place]) 04/13/2009   IRRITABLE BOWEL SYNDROME 04/13/2009    Past Surgical History:  Procedure Laterality Date   CHOLECYSTECTOMY N/A 12/17/2013   Procedure: LAPAROSCOPIC CHOLECYSTECTOMY WITH INTRAOPERATIVE CHOLANGIOGRAM;  Surgeon: Fanny Skates, MD;  Location: [facility];  Service: General;  Laterality: N/A;   COLON SURGERY  2012   Diverticulosis   KNEE ARTHROSCOPY Right 2019   x 2   TONSILLECTOMY     VAGINAL HYSTERECTOMY      OB History   No obstetric history on file.      Home Medications    Prior to Admission medications   Medication Sig Start Date End Date Taking? Authorizing Provider  albuterol (VENTOLIN HFA) 108 (90 Base) MCG/ACT inhaler Inhale 1-2 puffs into the lungs every 6 (six) hours as needed for wheezing or shortness of breath. 02/26/21  Yes Hazel Sams, PA-C  ALPRAZolam Duanne Moron) 1 MG tablet TAKE 1/2 TO 1 TABLET BY MOUTH TWICE A DAY AS NEEDED IF NEEDED FOR ANXIETY ATTACK 02/23/21  Yes Magda Bernheim, NP  amoxicillin-clavulanate (AUGMENTIN) 875-125 MG tablet Take 1 tablet by mouth every 12 (twelve) hours. 02/26/21  Yes Hazel Sams, PA-C  aspirin 81 MG tablet Take 81 mg by mouth daily.    Yes [provider]  Cholecalciferol (VITAMIN D3) 125 MCG (5000 UT) CAPS Takes 2 capsules (10,000 units )  Daily 12/17/20  Yes Unk Pinto, MD  ELDERBERRY PO Take by mouth.   Yes [provider]  fluticasone Asencion Islam) 50 MCG/ACT nasal spray Use  2 sprays  to each Nostril  Daily 01/27/21  Yes Unk Pinto, MD  ibuprofen (ADVIL) 800 MG tablet Take 1 tablet (800 mg total) by mouth every 8 (eight) hours as needed. 07/13/18  Yes Trula Slade, DPM   MAGNESIUM CITRATE PO Take 400 mg by mouth daily.   Yes [provider]  montelukast (SINGULAIR) 10 MG tablet Take  1 tablet  Daily for Allergies                                            /                TAKE 1 TABLET BY MOUTH 01/14/21  Yes Unk Pinto, MD  omeprazole (PRILOSEC) 40 MG capsule Take 1 capsule Daily to Prevent Heartburn & Indigestion  / Patient knows to take by mouth 10/16/20  Yes Unk Pinto, MD  phentermine (ADIPEX-P) 37.5 MG tablet Take  1/2 to 1 tablet  every Morning  for Dieting & Weight Loss 08/10/20  Yes Unk Pinto, MD  promethazine-dextromethorphan (PROMETHAZINE-DM) 6.25-15 MG/5ML syrup Take 5 mLs by mouth 4 (four) times daily as needed for cough. 02/26/21  Yes Hazel Sams, PA-C  topiramate (TOPAMAX) 50 MG tablet TAKE 1/2 TO 1 TABLET 2 X /DAY AT SUPPERTIME & BEDTIME FOR DIETING & WEIGHT LOSS 09/20/20  Yes Liane Comber, NP  triamcinolone cream (KENALOG) 0.5 % APPLY TO AFFECTED AREA TWICE A DAY 01/27/21  Yes Unk Pinto, MD  vitamin C (ASCORBIC ACID) 250 MG tablet Take 250 mg by mouth daily.   Yes [provider]  Zinc 50 MG TABS Take by mouth.   Yes [provider]  Omega-3 Fatty Acids (OMEGA-3 FISH OIL PO) Take by mouth daily.    [provider]  rosuvastatin (CRESTOR) 5 MG tablet START TAKING 1 TAB ONCE WEEKLY FOR CHOLESTEROL; SLOWLY TAPER AS TOLERATED TO 3 DAYS A WEEK. 08/30/20   Liane Comber, NP    Family History Family History  Problem Relation Age of Onset   Heart disease Father    Heart attack Father 21   Heart disease Mother    Hypertension Mother    Osteoporosis Mother    Heart attack Brother 28   Heart disease Brother    Throat cancer Brother        remote smoker   Irritable bowel syndrome Sister    Brain cancer Sister 62   Cancer Paternal Aunt        oral    Social History Social History   Tobacco Use   Smoking status: Never   Smokeless tobacco: Never  Vaping Use   Vaping Use: Never  used  Substance Use Topics   Alcohol use: No   Drug use: No     Allergies   Atorvastatin, Levofloxacin, and Prednisone   Review of Systems Review of Systems  Constitutional:  Negative for appetite change, chills and fever.  HENT:  Positive for congestion, sinus pressure and sinus pain. Negative for  ear pain, rhinorrhea and sore throat.   Eyes:  Negative for redness and visual disturbance.  Respiratory:  Positive for cough. Negative for chest tightness, shortness of breath and wheezing.   Cardiovascular:  Negative for chest pain and palpitations.  Gastrointestinal:  Positive for abdominal pain. Negative for constipation, diarrhea, nausea and vomiting.  Genitourinary:  Positive for frequency. Negative for dysuria and urgency.  Musculoskeletal:  Negative for myalgias.  Neurological:  Negative for dizziness, weakness and headaches.  Psychiatric/Behavioral:  Negative for confusion.   All other systems reviewed and are negative.   Physical Exam Triage Vital Signs ED Triage Vitals  Enc Vitals Group     BP 02/26/21 1440 130/68     Pulse Rate 02/26/21 1438 (!) 104     Resp 02/26/21 1438 18     Temp 02/26/21 1438 99.2 F (37.3 C)     Temp Source 02/26/21 1438 Oral     SpO2 02/26/21 1438 100 %     Weight --      Height --      Head Circumference --      Peak Flow --      Pain Score 02/26/21 1441 8     Pain Loc --      Pain Edu? --      Excl. in [place]? --    No data found.  Updated Vital Signs BP 130/68 (BP Location: Right Arm)    Pulse (!) 104    Temp 99.2 F (37.3 C) (Oral)    Resp 18    SpO2 100%   Visual Acuity Right Eye Distance:   Left Eye Distance:   Bilateral Distance:    Right Eye Near:   Left Eye Near:    Bilateral Near:     Physical Exam Vitals reviewed.  Constitutional:      General: She is not in acute distress.    Appearance: Normal appearance. She is not ill-appearing.  HENT:     Head: Normocephalic and atraumatic.     Right Ear: Tympanic membrane,  ear canal and external ear normal. No tenderness. No middle ear effusion. There is no impacted cerumen. Tympanic membrane is not perforated, erythematous, retracted or bulging.     Left Ear: Tympanic membrane, ear canal and external ear normal. No tenderness.  No middle ear effusion. There is no impacted cerumen. Tympanic membrane is not perforated, erythematous, retracted or bulging.     Nose: No congestion.     Right Sinus: Maxillary sinus tenderness present.     Left Sinus: Maxillary sinus tenderness present.     Mouth/Throat:     Mouth: Mucous membranes are moist.     Pharynx: Uvula midline. No oropharyngeal exudate or posterior oropharyngeal erythema.     Comments: Moist mucous membranes Eyes:     Extraocular Movements: Extraocular movements intact.     Pupils: Pupils are equal, round, and reactive to light.  Cardiovascular:     Rate and Rhythm: Normal rate and regular rhythm.     Heart sounds: Normal heart sounds.  Pulmonary:     Effort: Pulmonary effort is normal.     Breath sounds: Normal breath sounds. No decreased breath sounds, wheezing, rhonchi or rales.  Abdominal:     General: Bowel sounds are normal. There is no distension.     Palpations: Abdomen is soft. There is no mass.     Tenderness: There is no abdominal tenderness. There is no right CVA tenderness, left CVA tenderness, guarding or rebound. Negative  signs include Murphy's sign, Rovsing's sign and McBurney's sign.     Comments: No reproducible pain  Musculoskeletal:     Comments: R sided lumbar paraspinous muscle tenderness to palpation. Pain elicited with movement. No midline spinous tenderness.   Lymphadenopathy:     Cervical: No cervical adenopathy.     Right cervical: No superficial cervical adenopathy.    Left cervical: No superficial cervical adenopathy.  Skin:    General: Skin is warm.     Capillary Refill: Capillary refill takes less than 2 seconds.     Comments: Good skin turgor  Neurological:      General: No focal deficit present.     Mental Status: She is alert and oriented to person, place, and time.  Psychiatric:        Mood and Affect: Mood normal.        Behavior: Behavior normal.        Thought Content: Thought content normal.        Judgment: Judgment normal.     UC Treatments / Results  Labs (all labs ordered are listed, but only abnormal results are displayed) Labs Reviewed  POCT URINALYSIS DIPSTICK, ED / UC  CERVICOVAGINAL ANCILLARY ONLY    EKG   Radiology No results found.  Procedures Procedures (including critical care time)  Medications Ordered in UC Medications - No data to display  Initial Impression / Assessment and Plan / UC Course  I have reviewed the triage vital signs and the nursing notes.  Pertinent labs & imaging results that were available during my care of the patient were reviewed by me and considered in my medical decision making (see chart for details).     This patient is a very pleasant 70 y.o. year old female presenting with sinusitis following viral URI, and possible vaginitis. Afebrile, nontachycardic, no reproducible abd pain or CVAT  Keflex originally sent for sinusitis. Patient now stating that this makes her nauseous and is requesting augmentin. Sent. Keflex discontinued.   For cough- promethazine DM and albuterol sent. Originally planned to send prednisone but she is allergic to this.  Suspect vaginitis. Will send self-swab for G/C, trich, yeast, BV testing. Declines HIV, RPR. Safe sex precautions.   For lumbar strain- she does not have CVAT or flank pain. Reassurance provided.   History diverticulitis. She does not currently have signs or symptoms of this. Head to ED if severe abd pain.  F/u with PCP if symptoms persist.  Final Clinical Impressions(s) / UC Diagnoses   Final diagnoses:  Strain of lumbar region, initial encounter  Viral URI with cough  Acute non-recurrent maxillary sinusitis  Vaginitis and  vulvovaginitis     Discharge Instructions      -Start the antibiotic-Augmentin (amoxicillin-clavulanate), 1 pill every 12 hours for 7 days.  You can take this with food like with breakfast and dinner. -Albuterol inhaler as needed for cough, wheezing, shortness of breath, 1 to 2 puffs every 6 hours as needed. -Promethazine DM cough syrup for congestion/cough. This could make you drowsy, so take at night before bed. -We'll call if the results of your vaginal swab are positive.  -Follow-up with PCP if symptoms worsen/persist. -Severe abd pain/ concern for diverticulitis - follow-up with ED.     ED Prescriptions     Medication Sig Dispense Auth. Provider   albuterol (VENTOLIN HFA) 108 (90 Base) MCG/ACT inhaler Inhale 1-2 puffs into the lungs every 6 (six) hours as needed for wheezing or shortness of breath. 1 each Phillip Heal,  Sherlon Handing, PA-C   promethazine-dextromethorphan (PROMETHAZINE-DM) 6.25-15 MG/5ML syrup Take 5 mLs by mouth 4 (four) times daily as needed for cough. 118 mL Hazel Sams, PA-C   cephALEXin (KEFLEX) 500 MG capsule  (Status: Discontinued) Take 1 capsule (500 mg total) by mouth 4 (four) times daily. 20 capsule Hazel Sams, PA-C   amoxicillin-clavulanate (AUGMENTIN) 875-125 MG tablet Take 1 tablet by mouth every 12 (twelve) hours. 14 tablet Hazel Sams, PA-C      PDMP not reviewed this encounter.   Hazel Sams, PA-C 02/26/21 1600

## 2021-02-26 NOTE — Discharge Instructions (Addendum)
-  Start the antibiotic-Augmentin (amoxicillin-clavulanate), 1 pill every 12 hours for 7 days.  You can take this with food like with breakfast and dinner. -Albuterol inhaler as needed for cough, wheezing, shortness of breath, 1 to 2 puffs every 6 hours as needed. -Promethazine DM cough syrup for congestion/cough. This could make you drowsy, so take at night before bed. -We'll call if the results of your vaginal swab are positive.  -Follow-up with PCP if symptoms worsen/persist. -Severe abd pain/ concern for diverticulitis - follow-up with ED.

## 2021-02-26 NOTE — ED Triage Notes (Signed)
Pt reports sinus pressure, cough x 1 week.  No fever.  Has been using Flonase and Tylenol with minimal relief.    Also reports R flank pain x 1 week.  Had yeast infection approx 3 weeks ago.  + frequency.  Denies hematuria, dysuria.

## 2021-02-27 LAB — CERVICOVAGINAL ANCILLARY ONLY
Bacterial Vaginitis (gardnerella): NEGATIVE
Candida Glabrata: NEGATIVE
Candida Vaginitis: NEGATIVE
Chlamydia: NEGATIVE
Comment: NEGATIVE
Comment: NEGATIVE
Comment: NEGATIVE
Comment: NEGATIVE
Comment: NEGATIVE
Comment: NORMAL
Neisseria Gonorrhea: NEGATIVE
Trichomonas: POSITIVE — AB

## 2021-02-28 ENCOUNTER — Telehealth (HOSPITAL_COMMUNITY): Payer: Self-pay | Admitting: Emergency Medicine

## 2021-02-28 MED ORDER — METRONIDAZOLE 500 MG PO TABS: 500.0000 mg | ORAL_TABLET | Freq: Two times a day (BID) | ORAL | 0 refills | Status: DC

## 2021-03-28 ENCOUNTER — Encounter: Payer: Self-pay | Admitting: Internal Medicine

## 2021-04-23 ENCOUNTER — Encounter: Payer: Medicare HMO | Admitting: Adult Health

## 2021-04-23 NOTE — Progress Notes (Signed)
Pt not seen due to insurance issue

## 2021-04-24 ENCOUNTER — Other Ambulatory Visit: Payer: Self-pay | Admitting: Nurse Practitioner

## 2021-04-24 ENCOUNTER — Ambulatory Visit: Payer: Medicare HMO | Admitting: Nurse Practitioner

## 2021-04-24 ENCOUNTER — Other Ambulatory Visit: Payer: Self-pay

## 2021-04-24 DIAGNOSIS — Z0000 Encounter for general adult medical examination without abnormal findings: Secondary | ICD-10-CM

## 2021-04-24 DIAGNOSIS — Z136 Encounter for screening for cardiovascular disorders: Secondary | ICD-10-CM

## 2021-04-24 DIAGNOSIS — F3341 Major depressive disorder, recurrent, in partial remission: Secondary | ICD-10-CM

## 2021-04-24 DIAGNOSIS — K589 Irritable bowel syndrome without diarrhea: Secondary | ICD-10-CM

## 2021-04-24 DIAGNOSIS — Z79899 Other long term (current) drug therapy: Secondary | ICD-10-CM

## 2021-04-24 DIAGNOSIS — E559 Vitamin D deficiency, unspecified: Secondary | ICD-10-CM

## 2021-04-24 DIAGNOSIS — F5102 Adjustment insomnia: Secondary | ICD-10-CM

## 2021-04-24 DIAGNOSIS — F411 Generalized anxiety disorder: Secondary | ICD-10-CM

## 2021-04-24 DIAGNOSIS — E2839 Other primary ovarian failure: Secondary | ICD-10-CM

## 2021-04-24 DIAGNOSIS — E782 Mixed hyperlipidemia: Secondary | ICD-10-CM

## 2021-04-24 DIAGNOSIS — R7309 Other abnormal glucose: Secondary | ICD-10-CM

## 2021-04-24 DIAGNOSIS — J301 Allergic rhinitis due to pollen: Secondary | ICD-10-CM

## 2021-04-24 DIAGNOSIS — R0989 Other specified symptoms and signs involving the circulatory and respiratory systems: Secondary | ICD-10-CM

## 2021-04-24 DIAGNOSIS — Z1389 Encounter for screening for other disorder: Secondary | ICD-10-CM

## 2021-04-25 ENCOUNTER — Other Ambulatory Visit: Payer: Self-pay | Admitting: Internal Medicine

## 2021-04-25 DIAGNOSIS — E669 Obesity, unspecified: Secondary | ICD-10-CM

## 2021-04-25 MED ORDER — PHENTERMINE HCL 37.5 MG PO TABS: ORAL_TABLET | ORAL | 1 refills | Status: DC

## 2021-05-18 ENCOUNTER — Encounter: Payer: Medicare HMO | Admitting: Nurse Practitioner

## 2021-05-29 NOTE — Progress Notes (Signed)
Complete Physical ? ?Assessment and Plan: ? ? ?Encounter for general adult medical examination with abnormal findings ?1 year ? ?Allergic rhinitis due to pollen, unspecified seasonality ?Continue allergy medication ? ?Irritable bowel syndrome, unspecified type ?If not on benefiber then add it, decrease stress,  if any worsening symptoms, blood in stool, AB pain, etc call office ? ?Elevated blood pressure reading without diagnosis of hypertension ?- continue medications, DASH diet, exercise and monitor at home. Call if greater than 130/80.  ?-     CBC with Differential/Platelet ?-     CMP/GFR ?-     TSH ?-     Urinalysis, Routine w reflex microscopic ?-     Microalbumin / creatinine urine ratio ?-     EKG 12-Lead ? ?Other abnormal glucose ?Discussed disease progression and risks ?Discussed diet/exercise, weight management and risk modification ? ?Mixed hyperlipidemia ?-continue medications, LDL goal <100, check lipids, decrease fatty foods, increase activity.  ?-     Lipid panel ? ?Vitamin D deficiency ?Continue Vit D supplementation to maintain value in therapeutic level of 60-100  ?-     VITAMIN D 25 Hydroxy (Vit-D Deficiency, Fractures) ? ?Recurrent depression in partial remission /Anxiety state/ insomnia  ?Currently only using Alprazolam 1 mg PRN- was previously prescribed Lexapro ?Last script for Alprazolam 1 mg #60 filled on 04/25/21. ?Lifestyle discussed: diet/exerise, sleep hygiene, stress management, hydration ? ? ?Obesity- BMI 34 ?Long discussion about weight loss, diet, and exercise ?Recommended diet heavy in fruits and veggies and low in animal meats, cheeses, and dairy products, appropriate calorie intake ?Discussed appropriate weight for height  ?Continue Phentermine and Topamax ?Follow up at next visit ? ?Medication management ?-     Magnesium ? ?Estrogen def/Osteopenia ?DEXA UTD ?Encouraged high calcium diet, vitamin D supplement as indicated and weight bearing exercises ? ?Screening for  hematuria/proteinuria ?- Routine UA with reflex microscopic ?- Microablumin/creatinine urine ratio ? ?Screening for cardiovascular condition ?- EKG ? ? ? ? ? ?Discussed med's effects and SE's. Screening labs and tests as requested with regular follow-up as recommended. ?Future Appointments  ?Date Time Provider [place]  ?08/30/2021  2:00 PM Liane Comber, NP [facility] None  ?06/03/2022  3:00 PM Cranford, Kenney Houseman, NP [facility] None  ? ? ? ?HPI  ?71 y.o. female  presents for a complete physical. She has Anxiety; Major depressive disorder in partial remission ([place]); IRRITABLE BOWEL SYNDROME; Allergic rhinitis; Vitamin D deficiency; Elevated blood pressure reading without diagnosis of hypertension; Other abnormal glucose (prediabetes); Mixed hyperlipidemia; Morbid obesity ([place]); GERD (gastroesophageal reflux disease); Plantar fasciitis of left foot; Insomnia due to psychological stress; Iron deficiency anemia; and Osteopenia on their problem list.  ? ?She is married, she has 1 child and 2 grandkids, watches occasionally.  ?She is retired but still helps with polling in [employer]  ? ?She is following with Dr. Harrington Challenger for GYN every 2 years, saw 02/01/2021 and had mammogram and pelvic, has upcoming this year. Has hysterectomy sparing ovaries in 1992.  ? ?She has been having vague intermittent RLQ vs R groin pain/tenderness; struggles to characterize; denies constipation, fever/chills, bowel changes; incidentally not having at time of exam; she was evaluated at [facility] 04/10/2020 without significant findings. IBS symptoms have not been as bad. ? ?BMI is Body mass index is 34.15 kg/m?., she has been working on diet and exercise.  No ETOH. She has been prescribed phentermine/topamax for weight loss with benefit, has run out of phentermine. She does run after her grandkids ?Wt Readings from Last  3 Encounters:  ?05/30/21 211 lb 9.6 oz (96 kg)  ?12/18/20 212 lb 12.8 oz (96.5 kg)  ?08/30/20 212 lb 12.8 oz (96.5 kg)  ? ?Her  blood pressure has been controlled at home, today their BP is BP: 112/64.   ?BP Readings from Last 3 Encounters:  ?05/30/21 112/64  ?02/26/21 130/68  ?12/18/20 118/78  ? She does not workout. She denies chest pain, shortness of breath, dizziness.  ? ?She is on cholesterol medication (rosuvastatin 5 mg daily) and denies myalgias. Her cholesterol is not at goal of LDL <100. The cholesterol last visit was:  ?Lab Results  ?Component Value Date  ? CHOL 205 (H) 12/18/2020  ? HDL 38 (L) 12/18/2020  ? LDLCALC 141 (H) 12/18/2020  ? TRIG 131 12/18/2020  ? CHOLHDL 5.4 (H) 12/18/2020  ?Marland Kitchen ?She hasn't been working on diet and exercise for hx of prediabetes (5.7% in 2015, 5.8% in 03/2018), she is on bASA, she is not on ACE/ARB and denies foot ulcerations, hyperglycemia, hypoglycemia , increased appetite, nausea, paresthesia of the feet, polydipsia, polyuria, visual disturbances, vomiting and weight loss. Last A1C in the office was:  ?Lab Results  ?Component Value Date  ? HGBA1C 5.5 12/18/2020  ? ? Last GFR:  ?Lab Results  ?Component Value Date  ? GFRNONAA 76 08/30/2020  ? ?Patient is on Vitamin D supplement, taking 10000 IU daily    ?Lab Results  ?Component Value Date  ? VD25OH 51 12/18/2020  ?   ?Last GFR:  ?Lab Results  ?Component Value Date  ? GFRNONAA 76 08/30/2020  ? ? ? ? ? ?Current Medications:  ?Current Outpatient Medications on File Prior to Visit  ?Medication Sig Dispense Refill  ? albuterol (VENTOLIN HFA) 108 (90 Base) MCG/ACT inhaler Inhale 1-2 puffs into the lungs every 6 (six) hours as needed for wheezing or shortness of breath. 1 each 0  ? ALPRAZolam (XANAX) 1 MG tablet TAKE 1/2 TO 1 TABLET BY MOUTH TWICE A DAY AS NEEDED IF NEEDED FOR ANXIETY ATTACK 60 tablet 0  ? aspirin 81 MG tablet Take 81 mg by mouth daily.     ? Calcium Carb-Cholecalciferol (CALCIUM 500+D3) 500-5 MG-MCG TABS Take by mouth.    ? Cholecalciferol (VITAMIN D3) 125 MCG (5000 UT) CAPS Takes 2 capsules (10,000 units )  Daily    ? CINNAMON PO Take 1,200  mg by mouth.    ? ELDERBERRY PO Take by mouth.    ? Ferrous Sulfate (IRON PO) Take 18 mg by mouth.    ? fluticasone (FLONASE) 50 MCG/ACT nasal spray Use  2 sprays  to each Nostril  Daily 48 mL 3  ? ibuprofen (ADVIL) 800 MG tablet Take 1 tablet (800 mg total) by mouth every 8 (eight) hours as needed. 30 tablet 0  ? montelukast (SINGULAIR) 10 MG tablet Take  1 tablet  Daily for Allergies                                            /                TAKE 1 TABLET BY MOUTH 90 tablet 3  ? omeprazole (PRILOSEC) 40 MG capsule Take 1 capsule Daily to Prevent Heartburn & Indigestion  / Patient knows to take by mouth 90 capsule 3  ? rosuvastatin (CRESTOR) 5 MG tablet START TAKING 1 TAB ONCE WEEKLY FOR CHOLESTEROL;  SLOWLY TAPER AS TOLERATED TO 3 DAYS A WEEK. 39 tablet 1  ? topiramate (TOPAMAX) 50 MG tablet TAKE 1/2 TO 1 TABLET 2 X /DAY AT SUPPERTIME & BEDTIME FOR DIETING & WEIGHT LOSS 180 tablet 1  ? triamcinolone cream (KENALOG) 0.5 % APPLY TO AFFECTED AREA TWICE A DAY 75 g 3  ? vitamin C (ASCORBIC ACID) 250 MG tablet Take 250 mg by mouth daily.    ? Zinc 50 MG TABS Take by mouth.    ? amoxicillin-clavulanate (AUGMENTIN) 875-125 MG tablet Take 1 tablet by mouth every 12 (twelve) hours. (Patient not taking: Reported on 05/30/2021) 14 tablet 0  ? ?No current facility-administered medications on file prior to visit.  ? ? ?Health Maintenance:   ?Immunization History  ?Administered Date(s) Administered  ? Influenza, High Dose Seasonal PF 04/10/2018  ? PFIZER(Purple Top)SARS-COV-2 Vaccination 07/06/2019, 07/27/2019  ? Pneumococcal Conjugate-13 04/21/2019  ? Pneumococcal Polysaccharide-23 04/20/2020  ? Tdap 08/27/2012  ? ? ?Tetanus: 2014 ?Flu vaccine: 12/2019 ?Pneumonia: 04/20/20 ?Prevnar 13: 04/2019 ?Shingrix: ask insurance  ?Covid 19: 2/2, pfizer 2021 ? ?LMP: Hysterectomy ?Pap: Hysterectomy 2012, sees GYN every other year, last pelvic 12/2018 ?MGM: 02/14/21 ?DEXA: 01/04/21 osteopenia ? ?Colonoscopy: 2011, due 2021 wants to do  cologuard ?Cologuard: 07/20/2019 neg ?CT AB 08/01/2016 ?Stress test 2014 ? ?Last eye exam: remote, no issues, uses dollar store readers ?Last dental exam: Dr. Woodfin Ganja, last visit 2020, overdue, reminded to schedule ? ?Patient Musician

## 2021-05-30 ENCOUNTER — Encounter: Payer: Self-pay | Admitting: Nurse Practitioner

## 2021-05-30 ENCOUNTER — Ambulatory Visit (INDEPENDENT_AMBULATORY_CARE_PROVIDER_SITE_OTHER): Payer: Medicare HMO | Admitting: Nurse Practitioner

## 2021-05-30 VITALS — BP 112/64 | HR 93 | Temp 97.5°F | Ht 66.0 in | Wt 211.6 lb

## 2021-05-30 DIAGNOSIS — Z79899 Other long term (current) drug therapy: Secondary | ICD-10-CM | POA: Diagnosis not present

## 2021-05-30 DIAGNOSIS — Z1389 Encounter for screening for other disorder: Secondary | ICD-10-CM | POA: Diagnosis not present

## 2021-05-30 DIAGNOSIS — F411 Generalized anxiety disorder: Secondary | ICD-10-CM

## 2021-05-30 DIAGNOSIS — Z0000 Encounter for general adult medical examination without abnormal findings: Secondary | ICD-10-CM | POA: Diagnosis not present

## 2021-05-30 DIAGNOSIS — F5102 Adjustment insomnia: Secondary | ICD-10-CM

## 2021-05-30 DIAGNOSIS — E669 Obesity, unspecified: Secondary | ICD-10-CM

## 2021-05-30 DIAGNOSIS — R030 Elevated blood-pressure reading, without diagnosis of hypertension: Secondary | ICD-10-CM | POA: Diagnosis not present

## 2021-05-30 DIAGNOSIS — R7309 Other abnormal glucose: Secondary | ICD-10-CM | POA: Diagnosis not present

## 2021-05-30 DIAGNOSIS — J301 Allergic rhinitis due to pollen: Secondary | ICD-10-CM

## 2021-05-30 DIAGNOSIS — K589 Irritable bowel syndrome without diarrhea: Secondary | ICD-10-CM

## 2021-05-30 DIAGNOSIS — F3341 Major depressive disorder, recurrent, in partial remission: Secondary | ICD-10-CM

## 2021-05-30 DIAGNOSIS — Z136 Encounter for screening for cardiovascular disorders: Secondary | ICD-10-CM | POA: Diagnosis not present

## 2021-05-30 DIAGNOSIS — E782 Mixed hyperlipidemia: Secondary | ICD-10-CM

## 2021-05-30 DIAGNOSIS — E559 Vitamin D deficiency, unspecified: Secondary | ICD-10-CM | POA: Diagnosis not present

## 2021-05-30 DIAGNOSIS — Z0001 Encounter for general adult medical examination with abnormal findings: Secondary | ICD-10-CM

## 2021-05-30 DIAGNOSIS — M8580 Other specified disorders of bone density and structure, unspecified site: Secondary | ICD-10-CM

## 2021-05-30 DIAGNOSIS — E2839 Other primary ovarian failure: Secondary | ICD-10-CM

## 2021-05-30 MED ORDER — PHENTERMINE HCL 37.5 MG PO TABS: ORAL_TABLET | ORAL | 0 refills | Status: DC

## 2021-05-31 LAB — URINALYSIS, ROUTINE W REFLEX MICROSCOPIC
Bilirubin Urine: NEGATIVE
Glucose, UA: NEGATIVE
Hgb urine dipstick: NEGATIVE
Ketones, ur: NEGATIVE
Leukocytes,Ua: NEGATIVE
Nitrite: NEGATIVE
Protein, ur: NEGATIVE
Specific Gravity, Urine: 1.015 (ref 1.001–1.035)
pH: 6.5 (ref 5.0–8.0)

## 2021-05-31 LAB — LIPID PANEL
Cholesterol: 181 mg/dL (ref <200)
HDL: 37 mg/dL — ABNORMAL LOW (ref >=50)
LDL Cholesterol (Calc): 116 mg/dL (calc) — ABNORMAL HIGH
Non-HDL Cholesterol (Calc): 144 mg/dL (calc) — ABNORMAL HIGH (ref <130)
Total CHOL/HDL Ratio: 4.9 (calc) (ref <5.0)
Triglycerides: 162 mg/dL — ABNORMAL HIGH (ref <150)

## 2021-05-31 LAB — COMPLETE METABOLIC PANEL WITHOUT GFR
AG Ratio: 1.8 (calc) (ref 1.0–2.5)
ALT: 8 U/L (ref 6–29)
AST: 18 U/L (ref 10–35)
Albumin: 4.2 g/dL (ref 3.6–5.1)
Alkaline phosphatase (APISO): 130 U/L (ref 37–153)
BUN: 11 mg/dL (ref 7–25)
CO2: 28 mmol/L (ref 20–32)
Calcium: 9.5 mg/dL (ref 8.6–10.4)
Chloride: 107 mmol/L (ref 98–110)
Creat: 0.86 mg/dL (ref 0.50–1.05)
Globulin: 2.4 g/dL (ref 1.9–3.7)
Glucose, Bld: 85 mg/dL (ref 65–99)
Potassium: 4.5 mmol/L (ref 3.5–5.3)
Sodium: 143 mmol/L (ref 135–146)
Total Bilirubin: 0.6 mg/dL (ref 0.2–1.2)
Total Protein: 6.6 g/dL (ref 6.1–8.1)
eGFR: 74 mL/min/{1.73_m2}

## 2021-05-31 LAB — CBC WITH DIFFERENTIAL/PLATELET
Absolute Monocytes: 424 {cells}/uL (ref 200–950)
Basophils Absolute: 39 {cells}/uL (ref 0–200)
Basophils Relative: 0.7 %
Eosinophils Absolute: 253 {cells}/uL (ref 15–500)
Eosinophils Relative: 4.6 %
HCT: 40.2 % (ref 35.0–45.0)
Hemoglobin: 13.4 g/dL (ref 11.7–15.5)
Lymphs Abs: 1150 {cells}/uL (ref 850–3900)
MCH: 30.3 pg (ref 27.0–33.0)
MCHC: 33.3 g/dL (ref 32.0–36.0)
MCV: 91 fL (ref 80.0–100.0)
MPV: 11.4 fL (ref 7.5–12.5)
Monocytes Relative: 7.7 %
Neutro Abs: 3636 {cells}/uL (ref 1500–7800)
Neutrophils Relative %: 66.1 %
Platelets: 191 10*3/uL (ref 140–400)
RBC: 4.42 Million/uL (ref 3.80–5.10)
RDW: 12.9 % (ref 11.0–15.0)
Total Lymphocyte: 20.9 %
WBC: 5.5 10*3/uL (ref 3.8–10.8)

## 2021-05-31 LAB — HEMOGLOBIN A1C
Hgb A1c MFr Bld: 5.7 % of total Hgb — ABNORMAL HIGH (ref <5.7)
Mean Plasma Glucose: 117 mg/dL
eAG (mmol/L): 6.5 mmol/L

## 2021-05-31 LAB — MICROALBUMIN / CREATININE URINE RATIO
Creatinine, Urine: 125 mg/dL (ref 20–275)
Microalb Creat Ratio: 6 mcg/mg creat (ref <30)
Microalb, Ur: 0.8 mg/dL

## 2021-05-31 LAB — MAGNESIUM: Magnesium: 2 mg/dL (ref 1.5–2.5)

## 2021-05-31 LAB — VITAMIN D 25 HYDROXY (VIT D DEFICIENCY, FRACTURES): Vit D, 25-Hydroxy: 76 ng/mL (ref 30–100)

## 2021-05-31 LAB — TSH: TSH: 2.14 mIU/L (ref 0.40–4.50)

## 2021-06-11 ENCOUNTER — Other Ambulatory Visit: Payer: Self-pay | Admitting: Nurse Practitioner

## 2021-06-11 ENCOUNTER — Other Ambulatory Visit: Payer: Self-pay | Admitting: Adult Health

## 2021-06-11 DIAGNOSIS — E782 Mixed hyperlipidemia: Secondary | ICD-10-CM

## 2021-06-11 MED ORDER — ROSUVASTATIN CALCIUM 5 MG PO TABS: ORAL_TABLET | ORAL | 1 refills | Status: DC

## 2021-06-19 ENCOUNTER — Ambulatory Visit: Payer: Medicare HMO | Admitting: Podiatry

## 2021-06-19 ENCOUNTER — Ambulatory Visit (INDEPENDENT_AMBULATORY_CARE_PROVIDER_SITE_OTHER): Payer: Medicare HMO

## 2021-06-19 DIAGNOSIS — M722 Plantar fascial fibromatosis: Secondary | ICD-10-CM | POA: Diagnosis not present

## 2021-06-19 DIAGNOSIS — G629 Polyneuropathy, unspecified: Secondary | ICD-10-CM | POA: Diagnosis not present

## 2021-06-19 DIAGNOSIS — M1990 Unspecified osteoarthritis, unspecified site: Secondary | ICD-10-CM | POA: Diagnosis not present

## 2021-06-19 MED ORDER — GABAPENTIN 100 MG PO CAPS: 100.0000 mg | ORAL_CAPSULE | Freq: Every day | ORAL | 0 refills | Status: DC

## 2021-06-19 NOTE — Patient Instructions (Signed)
Gabapentin Capsules or Tablets What is this medication? GABAPENTIN (GA ba pen tin) treats nerve pain. It may also be used to prevent and control seizures in people with epilepsy. It works by calming overactive nerves in your body. This medicine may be used for other purposes; ask your health care provider or pharmacist if you have questions. COMMON BRAND NAME(S): Active-PAC with Gabapentin, Gabarone, Gralise, Neurontin What should I tell my care team before I take this medication? They need to know if you have any of these conditions: Alcohol or substance use disorder Kidney disease Lung or breathing disease Suicidal thoughts, plans, or attempt; a previous suicide attempt by you or a family member An unusual or allergic reaction to gabapentin, other medications, foods, dyes, or preservatives Pregnant or trying to get pregnant Breast-feeding How should I use this medication? Take this medication by mouth with a glass of water. Follow the directions on the prescription label. You can take it with or without food. If it upsets your stomach, take it with food. Take your medication at regular intervals. Do not take it more often than directed. Do not stop taking except on your care team's advice. If you are directed to break the 600 or 800 mg tablets in half as part of your dose, the extra half tablet should be used for the next dose. If you have not used the extra half tablet within 28 days, it should be thrown away. A special MedGuide will be given to you by the pharmacist with each prescription and refill. Be sure to read this information carefully each time. Talk to your care team about the use of this medication in children. While this medication may be prescribed for children as young as 3 years for selected conditions, precautions do apply. Overdosage: If you think you have taken too much of this medicine contact a poison control center or emergency room at once. NOTE: This medicine is only for  you. Do not share this medicine with others. What if I miss a dose? If you miss a dose, take it as soon as you can. If it is almost time for your next dose, take only that dose. Do not take double or extra doses. What may interact with this medication? Alcohol Antihistamines for allergy, cough, and cold Certain medications for anxiety or sleep Certain medications for depression like amitriptyline, fluoxetine, sertraline Certain medications for seizures like phenobarbital, primidone Certain medications for stomach problems General anesthetics like halothane, isoflurane, methoxyflurane, propofol Local anesthetics like lidocaine, pramoxine, tetracaine Medications that relax muscles for surgery Opioid medications for pain Phenothiazines like chlorpromazine, mesoridazine, prochlorperazine, thioridazine This list may not describe all possible interactions. Give your health care provider a list of all the medicines, herbs, non-prescription drugs, or dietary supplements you use. Also tell them if you smoke, drink alcohol, or use illegal drugs. Some items may interact with your medicine. What should I watch for while using this medication? Visit your care team for regular checks on your progress. You may want to keep a record at home of how you feel your condition is responding to treatment. You may want to share this information with your care team at each visit. You should contact your care team if your seizures get worse or if you have any new types of seizures. Do not stop taking this medication or any of your seizure medications unless instructed by your care team. Stopping your medication suddenly can increase your seizures or their severity. This medication may cause serious skin   reactions. They can happen weeks to months after starting the medication. Contact your care team right away if you notice fevers or flu-like symptoms with a rash. The rash may be red or purple and then turn into blisters or  peeling of the skin. Or, you might notice a red rash with swelling of the face, lips or lymph nodes in your neck or under your arms. Wear a medical identification bracelet or chain if you are taking this medication for seizures. Carry a card that lists all your medications. This medication may affect your coordination, reaction time, or judgment. Do not drive or operate machinery until you know how this medication affects you. Sit up or stand slowly to reduce the risk of dizzy or fainting spells. Drinking alcohol with this medication can increase the risk of these side effects. Your mouth may get dry. Chewing sugarless gum or sucking hard candy, and drinking plenty of water may help. Watch for new or worsening thoughts of suicide or depression. This includes sudden changes in mood, behaviors, or thoughts. These changes can happen at any time but are more common in the beginning of treatment or after a change in dose. Call your care team right away if you experience these thoughts or worsening depression. If you become pregnant while using this medication, you may enroll in the [place] Antiepileptic Drug Pregnancy Registry by calling 1-888-233-2334. This registry collects information about the safety of antiepileptic medication use during pregnancy. What side effects may I notice from receiving this medication? Side effects that you should report to your care team as soon as possible: Allergic reactions or angioedema--skin rash, itching, hives, swelling of the face, eyes, lips, tongue, arms, or legs, trouble swallowing or breathing Rash, fever, and swollen lymph nodes Thoughts of suicide or self harm, worsening mood, feelings of depression Trouble breathing Unusual changes in mood or behavior in children after use such as difficulty concentrating, hostility, or restlessness Side effects that usually do not require medical attention (report to your care team if they continue or are  bothersome): Dizziness Drowsiness Nausea Swelling of ankles, feet, or hands Vomiting This list may not describe all possible side effects. Call your doctor for medical advice about side effects. You may report side effects to FDA at 1-800-FDA-1088. Where should I keep my medication? Keep out of reach of children and pets. Store at room temperature between 15 and 30 degrees C (59 and 86 degrees F). Get rid of any unused medication after the expiration date. This medication may cause accidental overdose and death if taken by other adults, children, or pets. To get rid of medications that are no longer needed or have expired: Take the medication to a medication take-back program. Check with your pharmacy or law enforcement to find a location. If you cannot return the medication, check the label or package insert to see if the medication should be thrown out in the garbage or flushed down the toilet. If you are not sure, ask your care team. If it is safe to put it in the trash, empty the medication out of the container. Mix the medication with cat litter, dirt, coffee grounds, or other unwanted substance. Seal the mixture in a bag or container. Put it in the trash. NOTE: This sheet is a summary. It may not cover all possible information. If you have questions about this medicine, talk to your doctor, pharmacist, or health care provider.  2023 Elsevier/Gold Standard (2020-08-21 00:00:00)  

## 2021-06-21 ENCOUNTER — Other Ambulatory Visit: Payer: Self-pay | Admitting: Adult Health

## 2021-06-21 DIAGNOSIS — F411 Generalized anxiety disorder: Secondary | ICD-10-CM

## 2021-06-22 NOTE — Progress Notes (Signed)
Subjective: ?71 year old female presents today for concerns of bilateral foot pain.  She states she started get some discomfort at 6 months ago.  She is describing stinging, numbing the top of the toes and at times she gets redness and bumps in her feet.  The pain is worse at nighttime.  She tried icing.  No injuries that she reports.  No radiating pain. ? ?Objective: ?AAO x3, NAD ?DP/PT pulses palpable bilaterally, CRT less than 3 seconds ?Sensation appears to be intact with Thornell Mule monofilament, negative Tinel sign. ?There is no area pinpoint tenderness noted today.  There is no sign of edema and I am not able to visualize any significant erythema today.  No open lesions.  Flexor, extensor tendons appear to be intact.  MMT 5/5. ?No pain with calf compression, swelling, warmth, erythema ? ?Assessment: ?Concern for neuropathy ? ?Plan: ?-All treatment options discussed with the patient including all alternatives, risks, complications.  ?-X-rays were obtained and reviewed.  3 views were obtained.  No evidence of acute fracture or stress fracture. ?-I have ordered blood work today including vitamin B1, B6, B12, TSH, sed rate, CRP, CMP, ANA, CBC. ?-We will start gabapentin at nighttime.  Discussed side effects. ?-Patient encouraged to call the office with any questions, concerns, change in symptoms.  ? ?Trula Slade DPM ? ?

## 2021-06-25 LAB — ANTI-NUCLEAR AB-TITER (ANA TITER): ANA Titer 1: 1:1280 {titer} — ABNORMAL HIGH

## 2021-06-25 LAB — CBC WITH DIFFERENTIAL/PLATELET
Absolute Monocytes: 369 cells/uL (ref 200–950)
Basophils Absolute: 28 cells/uL (ref 0–200)
Basophils Relative: 0.5 %
Eosinophils Absolute: 369 cells/uL (ref 15–500)
Eosinophils Relative: 6.7 %
HCT: 39.1 % (ref 35.0–45.0)
Hemoglobin: 12.9 g/dL (ref 11.7–15.5)
Lymphs Abs: 1001 cells/uL (ref 850–3900)
MCH: 30.2 pg (ref 27.0–33.0)
MCHC: 33 g/dL (ref 32.0–36.0)
MCV: 91.6 fL (ref 80.0–100.0)
MPV: 11.3 fL (ref 7.5–12.5)
Monocytes Relative: 6.7 %
Neutro Abs: 3735 cells/uL (ref 1500–7800)
Neutrophils Relative %: 67.9 %
Platelets: 196 10*3/uL (ref 140–400)
RBC: 4.27 10*6/uL (ref 3.80–5.10)
RDW: 12.8 % (ref 11.0–15.0)
Total Lymphocyte: 18.2 %
WBC: 5.5 10*3/uL (ref 3.8–10.8)

## 2021-06-25 LAB — VITAMIN B1: Vitamin B1 (Thiamine): 12 nmol/L (ref 8–30)

## 2021-06-25 LAB — COMPLETE METABOLIC PANEL WITH GFR
AG Ratio: 1.8 (calc) (ref 1.0–2.5)
ALT: 6 U/L (ref 6–29)
AST: 16 U/L (ref 10–35)
Albumin: 4.2 g/dL (ref 3.6–5.1)
Alkaline phosphatase (APISO): 112 U/L (ref 37–153)
BUN: 13 mg/dL (ref 7–25)
CO2: 24 mmol/L (ref 20–32)
Calcium: 9.3 mg/dL (ref 8.6–10.4)
Chloride: 110 mmol/L (ref 98–110)
Creat: 0.81 mg/dL (ref 0.50–1.05)
Globulin: 2.4 g/dL (calc) (ref 1.9–3.7)
Glucose, Bld: 89 mg/dL (ref 65–139)
Potassium: 4.2 mmol/L (ref 3.5–5.3)
Sodium: 144 mmol/L (ref 135–146)
Total Bilirubin: 0.4 mg/dL (ref 0.2–1.2)
Total Protein: 6.6 g/dL (ref 6.1–8.1)
eGFR: 79 mL/min/{1.73_m2} (ref >=60)

## 2021-06-25 LAB — ANA: Anti Nuclear Antibody (ANA): POSITIVE — AB

## 2021-06-25 LAB — C-REACTIVE PROTEIN: CRP: 3.4 mg/L (ref <8.0)

## 2021-06-25 LAB — TSH: TSH: 3.78 mIU/L (ref 0.40–4.50)

## 2021-06-25 LAB — VITAMIN B12: Vitamin B-12: 406 pg/mL (ref 200–1100)

## 2021-06-25 LAB — VITAMIN B6: Vitamin B6: 2.1 ng/mL (ref 2.1–21.7)

## 2021-06-26 ENCOUNTER — Other Ambulatory Visit: Payer: Self-pay | Admitting: Podiatry

## 2021-06-26 DIAGNOSIS — R768 Other specified abnormal immunological findings in serum: Secondary | ICD-10-CM

## 2021-07-17 DIAGNOSIS — J019 Acute sinusitis, unspecified: Secondary | ICD-10-CM | POA: Diagnosis not present

## 2021-07-17 DIAGNOSIS — R053 Chronic cough: Secondary | ICD-10-CM | POA: Diagnosis not present

## 2021-08-06 ENCOUNTER — Ambulatory Visit: Payer: Medicare HMO | Admitting: Podiatry

## 2021-08-06 DIAGNOSIS — R768 Other specified abnormal immunological findings in serum: Secondary | ICD-10-CM | POA: Diagnosis not present

## 2021-08-06 DIAGNOSIS — M722 Plantar fascial fibromatosis: Secondary | ICD-10-CM

## 2021-08-06 MED ORDER — MELOXICAM 7.5 MG PO TABS: 7.5000 mg | ORAL_TABLET | Freq: Every day | ORAL | 1 refills | Status: DC | PRN

## 2021-08-06 NOTE — Patient Instructions (Signed)
Meloxicam Tablets What is this medication? MELOXICAM (mel OX i cam) treats mild to moderate pain, inflammation, or arthritis. It works by decreasing inflammation. It belongs to a group of medications called NSAIDs. This medicine may be used for other purposes; ask your health care provider or pharmacist if you have questions. COMMON BRAND NAME(S): Mobic What should I tell my care team before I take this medication? They need to know if you have any of these conditions: Asthma (lung or breathing disease) Bleeding disorder Coronary artery bypass graft (CABG) within the past 2 weeks Dehydration Heart attack Heart disease Heart failure High blood pressure If you often drink alcohol Kidney disease Liver disease Smoke tobacco cigarettes Stomach bleeding Stomach ulcers, other stomach or intestine problems Take medications that treat or prevent blood clots Taking other steroids like dexamethasone or prednisone An unusual or allergic reaction to meloxicam, other medications, foods, dyes, or preservatives Pregnant or trying to get pregnant Breast-feeding How should I use this medication? Take this medication by mouth. Take it as directed on the prescription label at the same time every day. You can take it with or without food. If it upsets your stomach, take it with food. Do not use it more often than directed. There may be unused or extra doses in the bottle after you finish your treatment. Talk to your care team if you have questions about your dose. A special MedGuide will be given to you by the pharmacist with each prescription and refill. Be sure to read this information carefully each time. Talk to your care team about the use of this medication in children. Special care may be needed. Patients over 27 years of age may have a stronger reaction and need a smaller dose. Overdosage: If you think you have taken too much of this medicine contact a poison control center or emergency room at  once. NOTE: This medicine is only for you. Do not share this medicine with others. What if I miss a dose? If you miss a dose, take it as soon as you can. If it is almost time for your next dose, take only that dose. Do not take double or extra doses. What may interact with this medication? Do not take this medication with any of the following: Cidofovir Ketorolac This medication may also interact with the following: Aspirin and aspirin-like medications Certain medications for blood pressure, heart disease, irregular heart beat Certain medications for depression, anxiety, or psychotic disturbances Certain medications that treat or prevent blood clots like warfarin, enoxaparin, dalteparin, apixaban, dabigatran, rivaroxaban Cyclosporine Diuretics Fluconazole Lithium Methotrexate Other NSAIDs, medications for pain and inflammation, like ibuprofen and naproxen Pemetrexed This list may not describe all possible interactions. Give your health care provider a list of all the medicines, herbs, non-prescription drugs, or dietary supplements you use. Also tell them if you smoke, drink alcohol, or use illegal drugs. Some items may interact with your medicine. What should I watch for while using this medication? Visit your care team for regular checks on your progress. Tell your care team if your symptoms do not start to get better or if they get worse. Do not take other medications that contain aspirin, ibuprofen, or naproxen with this medication. Side effects such as stomach upset, nausea, or ulcers may be more likely to occur. Many non-prescription medications contain aspirin, ibuprofen, or naproxen. Always read labels carefully. This medication can cause serious ulcers and bleeding in the stomach. It can happen with no warning. Smoking, drinking alcohol, older age, and  poor health can also increase risks. Call your care team right away if you have stomach pain or blood in your vomit or stool. This  medication does not prevent a heart attack or stroke. This medication may increase the chance of a heart attack or stroke. The chance may increase the longer you use this medication or if you have heart disease. If you take aspirin to prevent a heart attack or stroke, talk to your care team about using this medication. Alcohol may interfere with the effect of this medication. Avoid alcoholic drinks. This medication may cause serious skin reactions. They can happen weeks to months after starting the medication. Contact your care team right away if you notice fevers or flu-like symptoms with a rash. The rash may be red or purple and then turn into blisters or peeling of the skin. Or, you might notice a red rash with swelling of the face, lips or lymph nodes in your neck or under your arms. Talk to your care team if you are pregnant before taking this medication. Taking this medication between weeks 20 and 30 of pregnancy may harm your unborn baby. Your care team will monitor you closely if you need to take it. After 30 weeks of pregnancy, do not take this medication. You may get drowsy or dizzy. Do not drive, use machinery, or do anything that needs mental alertness until you know how this medication affects you. Do not stand up or sit up quickly, especially if you are an older patient. This reduces the risk of dizzy or fainting spells. Be careful brushing or flossing your teeth or using a toothpick because you may get an infection or bleed more easily. If you have any dental work done, tell your dentist you are receiving this medication. This medication may make it more difficult to get pregnant. Talk to your care team if you are concerned about your fertility. What side effects may I notice from receiving this medication? Side effects that you should report to your care team as soon as possible: Allergic reactions--skin rash, itching, hives, swelling of the face, lips, tongue, or throat Bleeding--bloody or  black, tar-like stools, vomiting blood or brown material that looks like coffee grounds, red or dark brown urine, small red or purple spots on skin, unusual bruising or bleeding Heart attack--pain or tightness in the chest, shoulders, arms, or jaw, nausea, shortness of breath, cold or clammy skin, feeling faint or lightheaded Heart failure--shortness of breath, swelling of ankles, feet, or hands, sudden weight gain, unusual weakness or fatigue Increase in blood pressure Kidney injury--decrease in the amount of urine, swelling of the ankles, hands, or feet Liver injury--right upper belly pain, loss of appetite, nausea, light-colored stool, dark yellow or brown urine, yellowing skin or eyes, unusual weakness or fatigue Rash, fever, and swollen lymph nodes Redness, blistering, peeling, or loosening of the skin, including inside the mouth Stroke--sudden numbness or weakness of the face, arm, or leg, trouble speaking, confusion, trouble walking, loss of balance or coordination, dizziness, severe headache, change in vision Side effects that usually do not require medical attention (report to your care team if they continue or are bothersome): Diarrhea Nausea Upset stomach This list may not describe all possible side effects. Call your doctor for medical advice about side effects. You may report side effects to FDA at 1-800-FDA-1088. Where should I keep my medication? Keep out of the reach of children and pets. Store at room temperature between 20 and 25 degrees C (68 and  77 degrees F). Protect from moisture. Keep the container tightly closed. Get rid of any unused medication after the expiration date. To get rid of medications that are no longer needed or have expired: Take the medication to a medication take-back program. Check with your pharmacy or law enforcement to find a location. If you cannot return the medication, check the label or package insert to see if the medication should be thrown out  in the garbage or flushed down the toilet. If you are not sure, ask your care team. If it is safe to put it in the trash, empty the medication out of the container. Mix the medication with cat litter, dirt, coffee grounds, or other unwanted substance. Seal the mixture in a bag or container. Put it in the trash. NOTE: This sheet is a summary. It may not cover all possible information. If you have questions about this medicine, talk to your doctor, pharmacist, or health care provider.  2023 [employer] (2020-05-17 00:00:00)

## 2021-08-13 NOTE — Progress Notes (Signed)
Subjective: 71 year old female presents the office today for Evaluation of neuropathy as well as foot pain.  She did an appointment with rheumatology she is not scheduled until August.  Still some redness along her feet at times.  This happens 2-3 times a month and she uses an ice bath.  She having more discomfort on the bottom of the heel today.  She gets it in the morning also the end of the day.  No recent injuries that she reports.  Objective: AAO x3, NAD DP/PT pulses palpable bilaterally, CRT less than 3 seconds There is no segment discoloration changes noted today.  There is tenderness palpation on the plantar aspect calcaneal insertion of plantar fascia.  There is no pain with lateral compression of calcaneus.  No edema, erythema.  Flexor, extensor tendons appear to be intact.  No Tinel sign. No pain with calf compression, swelling, warmth, erythema  Assessment: Plantar fasciitis, positive ANA  Plan: -All treatment options discussed with the patient including all alternatives, risks, complications.  -Has a follow-up with rheumatology scheduled given positive ANA. -In regards to the plantar fascia as discussed traction, icing on a regular basis as well as shoes and good arch support. Prescribed mobic. Discussed side effects of the medication and directed to stop if any are to occur and call the office.  -Patient encouraged to call the office with any questions, concerns, change in symptoms.   Trula Slade DPM

## 2021-08-16 ENCOUNTER — Other Ambulatory Visit: Payer: Self-pay | Admitting: Nurse Practitioner

## 2021-08-16 DIAGNOSIS — F411 Generalized anxiety disorder: Secondary | ICD-10-CM

## 2021-08-30 ENCOUNTER — Ambulatory Visit (INDEPENDENT_AMBULATORY_CARE_PROVIDER_SITE_OTHER): Payer: Medicare HMO | Admitting: Adult Health

## 2021-08-30 ENCOUNTER — Encounter: Payer: Self-pay | Admitting: Adult Health

## 2021-08-30 VITALS — BP 108/72 | HR 108 | Temp 97.7°F | Ht 66.0 in | Wt 199.0 lb

## 2021-08-30 DIAGNOSIS — Z79899 Other long term (current) drug therapy: Secondary | ICD-10-CM | POA: Diagnosis not present

## 2021-08-30 DIAGNOSIS — E669 Obesity, unspecified: Secondary | ICD-10-CM

## 2021-08-30 DIAGNOSIS — J301 Allergic rhinitis due to pollen: Secondary | ICD-10-CM

## 2021-08-30 DIAGNOSIS — Z0001 Encounter for general adult medical examination with abnormal findings: Secondary | ICD-10-CM

## 2021-08-30 DIAGNOSIS — R7689 Other specified abnormal immunological findings in serum: Secondary | ICD-10-CM

## 2021-08-30 DIAGNOSIS — E782 Mixed hyperlipidemia: Secondary | ICD-10-CM

## 2021-08-30 DIAGNOSIS — R030 Elevated blood-pressure reading, without diagnosis of hypertension: Secondary | ICD-10-CM | POA: Diagnosis not present

## 2021-08-30 DIAGNOSIS — F3341 Major depressive disorder, recurrent, in partial remission: Secondary | ICD-10-CM | POA: Diagnosis not present

## 2021-08-30 DIAGNOSIS — M8580 Other specified disorders of bone density and structure, unspecified site: Secondary | ICD-10-CM

## 2021-08-30 DIAGNOSIS — R6889 Other general symptoms and signs: Secondary | ICD-10-CM

## 2021-08-30 DIAGNOSIS — D509 Iron deficiency anemia, unspecified: Secondary | ICD-10-CM

## 2021-08-30 DIAGNOSIS — R768 Other specified abnormal immunological findings in serum: Secondary | ICD-10-CM

## 2021-08-30 DIAGNOSIS — K219 Gastro-esophageal reflux disease without esophagitis: Secondary | ICD-10-CM

## 2021-08-30 DIAGNOSIS — E559 Vitamin D deficiency, unspecified: Secondary | ICD-10-CM

## 2021-08-30 DIAGNOSIS — F419 Anxiety disorder, unspecified: Secondary | ICD-10-CM

## 2021-08-30 DIAGNOSIS — R7309 Other abnormal glucose: Secondary | ICD-10-CM | POA: Diagnosis not present

## 2021-08-30 DIAGNOSIS — R69 Illness, unspecified: Secondary | ICD-10-CM | POA: Diagnosis not present

## 2021-08-30 DIAGNOSIS — Z0000 Encounter for general adult medical examination without abnormal findings: Secondary | ICD-10-CM

## 2021-08-30 DIAGNOSIS — E66811 Obesity, class 1: Secondary | ICD-10-CM

## 2021-08-30 DIAGNOSIS — F5102 Adjustment insomnia: Secondary | ICD-10-CM | POA: Diagnosis not present

## 2021-08-30 DIAGNOSIS — K589 Irritable bowel syndrome without diarrhea: Secondary | ICD-10-CM

## 2021-08-30 NOTE — Progress Notes (Signed)
ANNUAL WELLNESS VISIT AND FOLLOW UP   Assessment and Plan:   Annual Medicare Wellness Visit Due annually  Health maintenance reviewed - check about shingrix at pharmacy   Allergic rhinitis due to pollen, unspecified seasonality Continue allergy medication  Irritable bowel syndrome, unspecified type If not on benefiber then add it, decrease stress,  if any worsening symptoms, blood in stool, AB pain, etc call office  Elevated blood pressure reading without diagnosis of hypertension - continue medications, DASH diet, exercise and monitor at home. Call if greater than 130/80.  -     CBC with Differential/Platelet -     CMP/GFR  Other abnormal glucose Discussed disease progression and risks Discussed diet/exercise, weight management and risk modification  Mixed hyperlipidemia -continue medications - rosuvastatin 5 mg daily  - LDL goal <100, check lipids, decrease fatty foods, increase activity.  -     Lipid panel -     TSH -     CMP/GFR  Vitamin D deficiency Continue Vit D supplementation to maintain value in therapeutic level of 60-100  -     VITAMIN D 25 Hydroxy (Vit-D Deficiency, Fractures)  Obesity- BMI 32 Long discussion about weight loss, diet, and exercise Recommended diet heavy in fruits and veggies and low in animal meats, cheeses, and dairy products, appropriate calorie intake Discussed appropriate weight for height  Continue Phentermine and Topamax, excellent progress.  Follow up at next visit  Medication management -     Magnesium  Estrogen def/Osteopenia DEXA UTD Encouraged high calcium diet, vitamin D supplement as indicated and weight bearing exercises  Recurrent depression in partial remission /Anxiety state/ insomnia  Currently only using Alprazolam 1 mg PRN for sleep  Last script for Alprazolam 1 mg #60 filled on 08/16/2021 Lifestyle discussed: diet/exerise, sleep hygiene, stress management, hydration  - discussed: New guidelines suggest the  benzodiazepines are best short term, with prolonged use they lead to physical and psychological dependence. In addition, evidence suggest that for insomnia the effectiveness wanes in 4 weeks and the risks out weight their benefits. Use of these agents have been associated with dementia, falls, motor vehicle accidents and physical addiction. Decreasing these medication have been proven to show improvements in cognition, alertness, decrease of falls and daytime sedation.   - she is not ready to fully commit to tapering off, difficult time with her husband, receptive to trying benadryl, trying 1/2 tab xanax only when doing well, receptive to revisit taper discussion at next visit.   Orders Placed This Encounter  Procedures   CBC with Differential/Platelet   COMPLETE METABOLIC PANEL WITH GFR   Magnesium   Lipid panel   TSH   Iron, TIBC and Ferritin Panel    Discussed med's effects and SE's. Screening labs and tests as requested with regular follow-up as recommended. Future Appointments  Date Time Provider [place]  10/05/2021  9:40 AM Bo Merino, MD [facility] None  10/30/2021  2:40 PM Bo Merino, MD [facility] None  11/12/2021  1:45 PM Trula Slade, DPM [facility]  06/03/2022  3:00 PM Darrol Jump, NP [facility] None  09/02/2022  3:00 PM Darrol Jump, NP [facility] None    Plan:   During the course of the visit the patient was educated and counseled about appropriate screening and preventive services including:   Pneumococcal vaccine  Prevnar 13 Influenza vaccine Td vaccine Screening electrocardiogram Bone densitometry screening Colorectal cancer screening Diabetes screening Glaucoma screening Nutrition counseling  Advanced directives: requested    HPI  71 y.o.  female  presents for an AWV and follow up. She has Anxiety; Major depressive disorder in partial remission ([place]); IRRITABLE BOWEL SYNDROME; Allergic rhinitis; Vitamin D deficiency; Elevated  blood pressure reading without diagnosis of hypertension; Other abnormal glucose (prediabetes); Mixed hyperlipidemia; Obesity (BMI 30.0-34.9); GERD (gastroesophageal reflux disease); Plantar fasciitis of left foot; Insomnia due to psychological stress; Iron deficiency anemia; Osteopenia; and Positive ANA (antinuclear antibody) on their problem list.   She is married, she has 1 child and 2 grandkids, watches occasionally.  She is retired but still helps with polling in [employer]  Frustrated because husband has been moody and irritable this past year, not acting himself.   Hx of MDD/anxiety in remission, off of lexapro, has xanax 1 mg script but reports only using at night for sleep. Never during the day. She intermittently uses benadryl as well.   She is following with Dr. Harrington Challenger for GYN every 2 years, saw 02/01/2021 and had mammogram and pelvic, has upcoming this year.  Has hysterectomy sparing ovaries in 1992.   IBS doing better since her weight loss efforts.   Recently saw podiatry Dr. Jacqualyn Posey for foot pain, had sig + ANA 1:1280 in 06/19/2021 and was referred to rheum Dr. Estanislado Pandy, has pending 10/05/2021.  BMI is Body mass index is 32.12 kg/m., she has been working on diet and exercise, down 12 lb since last visit.  She has been prescribed phentermine/topamax for weight loss with benefit, down 12 lb since last visit.  Wt Readings from Last 3 Encounters:  08/30/21 199 lb (90.3 kg)  05/30/21 211 lb 9.6 oz (96 kg)  12/18/20 212 lb 12.8 oz (96.5 kg)   Her blood pressure has been controlled at home, today their BP is BP: 108/72.   BP Readings from Last 3 Encounters:  08/30/21 108/72  05/30/21 112/64  02/26/21 130/68   She does not workout. She denies chest pain, shortness of breath, dizziness.   She is on cholesterol medication (rosuvastatin 5 mg daily, has increased from 3 days a week, hx of myalgia with atorvastatin) and denies myalgias. Her cholesterol is not at goal of LDL <100. The  cholesterol last visit was:  Lab Results  Component Value Date   CHOL 181 05/30/2021   HDL 37 (L) 05/30/2021   LDLCALC 116 (H) 05/30/2021   TRIG 162 (H) 05/30/2021   CHOLHDL 4.9 05/30/2021  . She hasn't been working on diet and exercise for hx of prediabetes (5.7% in 2015, 5.8% in 03/2018), she is on bASA, she is not on ACE/ARB and denies foot ulcerations, hyperglycemia, hypoglycemia , increased appetite, nausea, paresthesia of the feet, polydipsia, polyuria, visual disturbances, vomiting and weight loss. Last A1C in the office was:  Lab Results  Component Value Date   HGBA1C 5.7 (H) 05/30/2021    Last GFR:  Lab Results  Component Value Date   EGFR 79 06/19/2021   Patient is on Vitamin D supplement, taking 10000 IU daily    Lab Results  Component Value Date   VD25OH 76 05/30/2021     Last GFR:  Lab Results  Component Value Date   GFRNONAA 76 08/30/2020   She has been on an iron supplement for mild deficiency, no anemia -  Lab Results  Component Value Date   IRON 70 08/30/2020   TIBC 338 08/30/2020   FERRITIN 9 (L) 08/30/2020     Current Medications:  Current Outpatient Medications on File Prior to Visit  Medication Sig Dispense Refill   ALPRAZolam (XANAX) 1  MG tablet TAKE 1/2 TO 1 TABLET BY MOUTH TWICE A DAY AS NEEDED IF NEEDED FOR ANXIETY ATTACK 60 tablet 0   aspirin 81 MG tablet Take 81 mg by mouth daily.      Calcium Carb-Cholecalciferol (CALCIUM 500+D3) 500-5 MG-MCG TABS Take by mouth.     Cholecalciferol (VITAMIN D3) 125 MCG (5000 UT) CAPS Takes 2 capsules (10,000 units )  Daily     CINNAMON PO Take 1,200 mg by mouth.     ELDERBERRY PO Take by mouth.     Ferrous Sulfate (IRON PO) Take 18 mg by mouth.     fluticasone (FLONASE) 50 MCG/ACT nasal spray Use  2 sprays  to each Nostril  Daily 48 mL 3   ibuprofen (ADVIL) 800 MG tablet Take 1 tablet (800 mg total) by mouth every 8 (eight) hours as needed. 30 tablet 0   meloxicam (MOBIC) 7.5 MG tablet Take 1 tablet (7.5 mg  total) by mouth daily as needed for pain. 30 tablet 1   montelukast (SINGULAIR) 10 MG tablet Take  1 tablet  Daily for Allergies                                            /                TAKE 1 TABLET BY MOUTH 90 tablet 3   omeprazole (PRILOSEC) 40 MG capsule Take 1 capsule Daily to Prevent Heartburn & Indigestion  / Patient knows to take by mouth 90 capsule 3   phentermine (ADIPEX-P) 37.5 MG tablet Take  1/2 to 1 tablet  every Morning  for Dieting & Weight Loss 90 tablet 0   rosuvastatin (CRESTOR) 5 MG tablet TAKE 1 TAB DAILY FOR CHOLESTEROL. 90 tablet 1   topiramate (TOPAMAX) 50 MG tablet TAKE 1/2 TO 1 TABLET 2 X /DAY AT SUPPERTIME & BEDTIME FOR DIETING & WEIGHT LOSS 180 tablet 1   triamcinolone cream (KENALOG) 0.5 % APPLY TO AFFECTED AREA TWICE A DAY 75 g 3   vitamin C (ASCORBIC ACID) 250 MG tablet Take 250 mg by mouth daily.     Zinc 50 MG TABS Take by mouth.     No current facility-administered medications on file prior to visit.    Health Maintenance:   Immunization History  Administered Date(s) Administered   Influenza, High Dose Seasonal PF 04/10/2018   PFIZER(Purple Top)SARS-COV-2 Vaccination 07/06/2019, 07/27/2019   Pneumococcal Conjugate-13 04/21/2019   Pneumococcal Polysaccharide-23 04/20/2020   Tdap 08/27/2012   Health Maintenance  Topic Date Due   Zoster Vaccines- Shingrix (1 of 2) Never done   COVID-19 Vaccine (3 - Pfizer risk series) 08/24/2019   MAMMOGRAM  02/14/2022   Fecal DNA (Cologuard)  07/20/2022   TETANUS/TDAP  08/28/2022   Pneumonia Vaccine 36+ Years old  Completed   DEXA SCAN  Completed   Hepatitis C Screening  Completed   HPV VACCINES  Aged Out   INFLUENZA VACCINE  Discontinued   COLONOSCOPY (Pts 45-39yr Insurance coverage will need to be confirmed)  Discontinued    Shingrix: ask insurance   Pap: Hysterectomy 2012, sees GYN every other year, last pelvic 02/01/2021 MGM: 02/14/21 DEXA: 01/04/21 osteopenia  -   Colonoscopy: 2011 Cologuard:  07/20/2019 neg  Last eye exam: dr. ?Marland Kitchen2022, no issues, uses [employer] [facility] Last dental exam: Dr. FWoodfin Ganja last visit 2022,  Patient Care  Team: Unk Pinto, MD as PCP - General (Internal Medicine) Rozetta Nunnery, MD (Inactive) as Consulting Physician (Otolaryngology) Nahser, Wonda Cheng, MD as Consulting Physician (Cardiology) Inda Castle, MD (Inactive) as Consulting Physician (Gastroenterology) Gus Height, MD (Inactive) as Consulting Physician (Obstetrics and Gynecology)  Medical History:  Past Medical History:  Diagnosis Date   Anxiety and depression    Cholecystitis, acute with cholelithiasis 12/17/2013   Depression    Diverticulitis    DIVERTICULITIS 04/19/2009   Qualifier: Diagnosis of  By: Deatra Ina MD, Sandy Salaam    Hyperlipidemia    Hypertension    Irritable bowel syndrome    Allergies Allergies  Allergen Reactions   Atorvastatin Other (See Comments)    Leg cramps   Levofloxacin    Prednisone     SURGICAL HISTORY She  has a past surgical history that includes Tonsillectomy; Vaginal hysterectomy; Colon surgery (2012); Cholecystectomy (N/A, 12/17/2013); and Knee arthroscopy (Right, 2019). FAMILY HISTORY Her family history includes Brain cancer (age of onset: 89) in her sister; Cancer in her paternal aunt; Heart attack (age of onset: 39) in her brother; Heart attack (age of onset: 59) in her father; Heart disease in her brother, father, and mother; Hypertension in her mother; Irritable bowel syndrome in her sister; Osteoporosis in her mother; Throat cancer in her brother. SOCIAL HISTORY She  reports that she has never smoked. She has never used smokeless tobacco. She reports that she does not drink alcohol and does not use drugs.  MEDICARE WELLNESS OBJECTIVES: Physical activity: Current Exercise Habits: Home exercise routine, Type of exercise: walking, Time (Minutes): 20, Frequency (Times/Week): 7, Weekly Exercise (Minutes/Week): 140, Intensity: Mild, Exercise  limited by: None identified Cardiac risk factors: Cardiac Risk Factors include: advanced age (>59mn, >>3women);dyslipidemia;hypertension;obesity (BMI >30kg/m2) Depression/mood screen:      08/30/2021    2:13 PM  Depression screen PHQ 2/9  Decreased Interest 0  Down, Depressed, Hopeless 0  PHQ - 2 Score 0    ADLs:     08/30/2021    2:08 PM 12/17/2020    7:50 PM  In your present state of health, do you have any difficulty performing the following activities:  Hearing? 0 0  Vision? 0 0  Difficulty concentrating or making decisions? 0 0  Walking or climbing stairs? 0 0  Dressing or bathing? 0 0  Doing errands, shopping? 0 0     Cognitive Testing  Alert? Yes  Normal Appearance?Yes  Oriented to person? Yes  Place? Yes   Time? Yes  Recall of three objects?  Yes  Can perform simple calculations? Yes  Displays appropriate judgment?Yes  Can read the correct time from a watch face?Yes  EOL planning: Does Patient Have a Medical Advance Directive?: No Would patient like information on creating a medical advance directive?: Yes (MAU/Ambulatory/Procedural Areas - Information given)     Review of Systems: Review of Systems  Constitutional:  Negative for chills, fever and malaise/fatigue.  HENT:  Negative for congestion, ear pain, hearing loss, sinus pain, sore throat and tinnitus.   Eyes: Negative.  Negative for blurred vision and double vision.  Respiratory:  Negative for cough, hemoptysis, sputum production, shortness of breath and wheezing.   Cardiovascular:  Negative for chest pain, palpitations and leg swelling.  Gastrointestinal:  Positive for constipation (ibs) and diarrhea (ibs). Negative for abdominal pain, blood in stool, heartburn, melena, nausea and vomiting.  Genitourinary: Negative.  Negative for dysuria and urgency.  Musculoskeletal:  Negative for back pain, falls, joint pain, myalgias  and neck pain.  Skin: Negative.  Negative for rash.  Neurological:  Negative for  dizziness, tingling, tremors, sensory change, loss of consciousness, weakness and headaches.  Endo/Heme/Allergies:  Does not bruise/bleed easily.  Psychiatric/Behavioral:  Positive for depression. Negative for substance abuse and suicidal ideas. The patient has insomnia. The patient is not nervous/anxious.     Physical Exam: Estimated body mass index is 32.12 kg/m as calculated from the following:   Height as of this encounter: _0  (1.676 m).   Weight as of this encounter: 199 lb (90.3 kg). BP 108/72   Pulse (!) 108   Temp 97.7 F (36.5 C)   Ht _1  (1.676 m)   Wt 199 lb (90.3 kg)   SpO2 95%   BMI 32.12 kg/m   General Appearance: Well nourished well developed, in no apparent distress.  Eyes: PERRLA, EOMs, conjunctiva no swelling or erythema ENT/Mouth: Ear canals normal without obstruction, swelling, erythema, or discharge.  TMs normal bilaterally with no erythema, bulging, retraction, or loss of landmark.  Oropharynx moist and clear with no exudate, erythema, or swelling.   Neck: Supple, thyroid normal. No bruits.  No cervical adenopathy Respiratory: Respiratory effort normal, Breath sounds clear A&P without wheeze, rhonchi, rales.   Cardio: RRR without murmurs, rubs or gallops. Brisk peripheral pulses without edema.  Chest: symmetric, with normal excursions Abdomen: Soft, nontender, no guarding, rebound, hernias, masses, or organomegaly.  Lymphatics: Non tender without lymphadenopathy.  Musculoskeletal: Full ROM all peripheral extremities,5/5 strength, and normal gait.  Skin: Warm, dry without rashes, lesions, ecchymosis. Neuro: Awake and oriented X 3, Cranial nerves intact, reflexes equal bilaterally. Normal muscle tone, no cerebellar symptoms. Sensation intact.  Psych:  normal affect, Insight and Judgment appropriate.    Medicare Attestation I have personally reviewed: The patient's medical and social history Their use of alcohol, tobacco or illicit drugs Their current  medications and supplements The patient's functional ability including ADLs,fall risks, home safety risks, cognitive, and hearing and visual impairment Diet and physical activities Evidence for depression or mood disorders  The patient's weight, height, BMI, and visual acuity have been recorded in the chart.  I have made referrals, counseling, and provided education to the patient based on review of the above and I have provided the patient with a written personalized care plan for preventive services.     Over 40 minutes of exam, counseling, chart review and critical decision making was performed  [name], NP-C 2:40 PM [facility]

## 2021-08-31 LAB — COMPLETE METABOLIC PANEL WITH GFR
AG Ratio: 1.6 (calc) (ref 1.0–2.5)
ALT: 7 U/L (ref 6–29)
AST: 13 U/L (ref 10–35)
Albumin: 4.4 g/dL (ref 3.6–5.1)
Alkaline phosphatase (APISO): 115 U/L (ref 37–153)
BUN: 13 mg/dL (ref 7–25)
CO2: 26 mmol/L (ref 20–32)
Calcium: 10 mg/dL (ref 8.6–10.4)
Chloride: 110 mmol/L (ref 98–110)
Creat: 1 mg/dL (ref 0.50–1.05)
Globulin: 2.7 g/dL (calc) (ref 1.9–3.7)
Glucose, Bld: 94 mg/dL (ref 65–99)
Potassium: 4.7 mmol/L (ref 3.5–5.3)
Sodium: 145 mmol/L (ref 135–146)
Total Bilirubin: 0.6 mg/dL (ref 0.2–1.2)
Total Protein: 7.1 g/dL (ref 6.1–8.1)
eGFR: 61 mL/min/{1.73_m2} (ref >=60)

## 2021-08-31 LAB — CBC WITH DIFFERENTIAL/PLATELET
Absolute Monocytes: 529 cells/uL (ref 200–950)
Basophils Absolute: 47 cells/uL (ref 0–200)
Basophils Relative: 0.7 %
Eosinophils Absolute: 141 cells/uL (ref 15–500)
Eosinophils Relative: 2.1 %
HCT: 41.5 % (ref 35.0–45.0)
Hemoglobin: 14 g/dL (ref 11.7–15.5)
Lymphs Abs: 1119 cells/uL (ref 850–3900)
MCH: 30.4 pg (ref 27.0–33.0)
MCHC: 33.7 g/dL (ref 32.0–36.0)
MCV: 90.2 fL (ref 80.0–100.0)
MPV: 11.6 fL (ref 7.5–12.5)
Monocytes Relative: 7.9 %
Neutro Abs: 4864 cells/uL (ref 1500–7800)
Neutrophils Relative %: 72.6 %
Platelets: 199 10*3/uL (ref 140–400)
RBC: 4.6 10*6/uL (ref 3.80–5.10)
RDW: 12.5 % (ref 11.0–15.0)
Total Lymphocyte: 16.7 %
WBC: 6.7 10*3/uL (ref 3.8–10.8)

## 2021-08-31 LAB — IRON,TIBC AND FERRITIN PANEL
%SAT: 34 % (calc) (ref 16–45)
Ferritin: 29 ng/mL (ref 16–288)
Iron: 94 ug/dL (ref 45–160)
TIBC: 279 mcg/dL (calc) (ref 250–450)

## 2021-08-31 LAB — MAGNESIUM: Magnesium: 2.1 mg/dL (ref 1.5–2.5)

## 2021-08-31 LAB — LIPID PANEL
Cholesterol: 187 mg/dL (ref <200)
HDL: 36 mg/dL — ABNORMAL LOW (ref >=50)
LDL Cholesterol (Calc): 125 mg/dL (calc) — ABNORMAL HIGH
Non-HDL Cholesterol (Calc): 151 mg/dL (calc) — ABNORMAL HIGH (ref <130)
Total CHOL/HDL Ratio: 5.2 (calc) — ABNORMAL HIGH (ref <5.0)
Triglycerides: 149 mg/dL (ref <150)

## 2021-08-31 LAB — TSH: TSH: 3.75 mIU/L (ref 0.40–4.50)

## 2021-09-19 ENCOUNTER — Other Ambulatory Visit: Payer: Self-pay | Admitting: Podiatry

## 2021-09-19 ENCOUNTER — Telehealth: Payer: Self-pay | Admitting: Podiatry

## 2021-09-19 MED ORDER — PREGABALIN 75 MG PO CAPS: 75.0000 mg | ORAL_CAPSULE | Freq: Two times a day (BID) | ORAL | 0 refills | Status: DC

## 2021-09-19 NOTE — Telephone Encounter (Signed)
Patient called she would like a call back from the nurse or Dr Jacqualyn Posey,  she said the neuropathy in both of her feet is getting a lot worse, she is getting stinging and hot pains in both feet.

## 2021-09-27 NOTE — Progress Notes (Signed)
Office Visit Note  Patient: Teresa Vasquez             Date of Birth: January 06, 1951           MRN: [id number]             PCP: Unk Pinto, MD Referring: Trula Slade, DPM Visit Date: 10/05/2021 Occupation: '@GUAROCC'$ @  Subjective:  Positive ANA, paresthesias  History of Present Illness: Teresa Vasquez is a 71 y.o. female seen in consultation per request of Dr. Jacqualyn Posey.  According to the patient her symptoms a started about 3 years ago with pain in bilateral heels.  The symptoms are more prominent in her right foot than the left foot.  At the time she was evaluated by Dr. Jacqualyn Posey.  According to the patient she was given orthotics and exercises which helped.  She became almost symptom-free for 1 year and then the symptoms recurred in April 2023.  She states she started experiencing tingling in her bilateral hands and her bilateral feet and also on the dorsum of her foot.  Dr. Jacqualyn Posey did x-rays of her bilateral feet and also some lab work.  Her labs showed positive ANA and for that reason she was referred to me.  Patient states yesterday she injured her hand with the recliner and developed a hematoma on the dorsum of her right hand.  It is not painful.  There is no history of oral ulcers, nasal ulcers, malar rash, sicca symptoms, Raynaud's phenomenon, photosensitivity or lymphadenopathy.  There is no history of inflammatory arthritis.  No family history of autoimmune disease.  There is no history of DVTs.  He is gravida 1, para 1.  Activities of Daily Living:  Patient reports morning stiffness for 0 minutes.   Patient Denies nocturnal pain.  Difficulty dressing/grooming: Denies Difficulty climbing stairs: Denies Difficulty getting out of chair: Denies Difficulty using hands for taps, buttons, cutlery, and/or writing: Denies  Review of Systems  Constitutional:  Positive for fatigue.  HENT:  Negative for mouth sores and mouth dryness.   Eyes:  Negative for dryness.  Respiratory:   Negative for shortness of breath.   Cardiovascular:  Negative for chest pain and palpitations.  Gastrointestinal:  Negative for blood in stool, constipation and diarrhea.  Endocrine: Negative for increased urination.  Genitourinary:  Negative for involuntary urination.  Musculoskeletal:  Negative for joint pain, joint pain, joint swelling, myalgias, muscle weakness, morning stiffness, muscle tenderness and myalgias.  Skin:  Negative for color change, rash, hair loss, redness and sensitivity to sunlight.  Allergic/Immunologic: Negative for susceptible to infections.  Neurological:  Positive for numbness and parasthesias. Negative for dizziness and headaches.  Hematological:  Negative for swollen glands.  Psychiatric/Behavioral:  Positive for sleep disturbance. Negative for depressed mood. The patient is nervous/anxious.     PMFS History:  Patient Active Problem List   Diagnosis Date Noted   Positive ANA (antinuclear antibody) 08/30/2021   Osteopenia 01/05/2021   Iron deficiency anemia 09/01/2020   Insomnia due to psychological stress 04/20/2020   Plantar fasciitis of left foot 06/16/2018   GERD (gastroesophageal reflux disease) 03/06/2018   Obesity (BMI 30.0-34.9) 08/27/2017   Allergic rhinitis 03/14/2013   Vitamin D deficiency 03/14/2013   Elevated blood pressure reading without diagnosis of hypertension 03/14/2013   Other abnormal glucose (prediabetes) 03/14/2013   Mixed hyperlipidemia 03/14/2013   Anxiety 04/13/2009   Major depressive disorder in partial remission ([place]) 04/13/2009   IRRITABLE BOWEL SYNDROME 04/13/2009    Past Medical History:  Diagnosis Date   Anxiety and depression    Cholecystitis, acute with cholelithiasis 12/17/2013   Depression    Diverticulitis    DIVERTICULITIS 04/19/2009   Qualifier: Diagnosis of  By: Deatra Ina MD, Sandy Salaam    Hyperlipidemia    Hypertension    Irritable bowel syndrome     Family History  Problem Relation Age of Onset   Heart  disease Mother    Hypertension Mother    Osteoporosis Mother    Heart disease Father    Heart attack Father 74   Irritable bowel syndrome Sister    Brain cancer Sister 31   Heart attack Brother 30   Heart disease Brother    Throat cancer Brother        remote smoker   Cancer Paternal Aunt        oral   Healthy Son    Past Surgical History:  Procedure Laterality Date   CHOLECYSTECTOMY N/A 12/17/2013   Procedure: LAPAROSCOPIC CHOLECYSTECTOMY WITH INTRAOPERATIVE CHOLANGIOGRAM;  Surgeon: Fanny Skates, MD;  Location: [place];  Service: General;  Laterality: N/A;   COLON SURGERY  2012   Diverticulosis   KNEE ARTHROSCOPY Right 2019   x 2   TONSILLECTOMY     VAGINAL HYSTERECTOMY     Social History   Social History Narrative   Daily caffine use.   Immunization History  Administered Date(s) Administered   Influenza, High Dose Seasonal PF 04/10/2018   PFIZER(Purple Top)SARS-COV-2 Vaccination 07/06/2019, 07/27/2019   Pneumococcal Conjugate-13 04/21/2019   Pneumococcal Polysaccharide-23 04/20/2020   Tdap 08/27/2012     Objective: Vital Signs: BP 120/79 (BP Location: Right Arm, Patient Position: Sitting, Cuff Size: Normal)   Pulse 97   Ht 5' 6.5" (1.689 m)   Wt 198 lb 12.8 oz (90.2 kg)   BMI 31.61 kg/m    Physical Exam Vitals and nursing note reviewed.  Constitutional:      Appearance: She is well-developed.  HENT:     Head: Normocephalic and atraumatic.  Eyes:     Conjunctiva/sclera: Conjunctivae normal.  Cardiovascular:     Rate and Rhythm: Normal rate and regular rhythm.     Heart sounds: Normal heart sounds.  Pulmonary:     Effort: Pulmonary effort is normal.     Breath sounds: Normal breath sounds.  Abdominal:     General: Bowel sounds are normal.     Palpations: Abdomen is soft.  Musculoskeletal:     Cervical back: Normal range of motion.  Lymphadenopathy:     Cervical: No cervical adenopathy.  Skin:    General: Skin is warm and dry.     Capillary  Refill: Capillary refill takes less than 2 seconds.  Neurological:     Mental Status: She is alert and oriented to person, place, and time.  Psychiatric:        Behavior: Behavior normal.      Musculoskeletal Exam: C-spine, thoracic and lumbar spine were in good range of motion.  Shoulder joints, elbow joints, wrist joints, MCPs PIPs and DIPs with good range of motion with no synovitis.  She had hematoma on the dorsum of her right hand from recent injury.  Hip joints had limited abduction without discomfort.  Knee joints with good range of motion.  There was no tenderness over ankles, MTPs PIPs or DIPs.  There was no tenderness over plantar fascia or Achilles tendon.  She had bilateral pes cavus.  CDAI Exam: CDAI Score: -- Patient Global: --; Provider Global: -- Swollen: --;  Tender: -- Joint Exam 10/05/2021   No joint exam has been documented for this visit   There is currently no information documented on the homunculus. Go to the Rheumatology activity and complete the homunculus joint exam.  Investigation: No additional findings.  Imaging: No results found.  Recent Labs: Lab Results  Component Value Date   WBC 6.7 08/30/2021   HGB 14.0 08/30/2021   PLT 199 08/30/2021   NA 145 08/30/2021   K 4.7 08/30/2021   CL 110 08/30/2021   CO2 26 08/30/2021   GLUCOSE 94 08/30/2021   BUN 13 08/30/2021   CREATININE 1.00 08/30/2021   BILITOT 0.6 08/30/2021   ALKPHOS 100 08/01/2016   AST 13 08/30/2021   ALT 7 08/30/2021   PROT 7.1 08/30/2021   ALBUMIN 4.3 08/01/2016   CALCIUM 10.0 08/30/2021   GFRAA 88 08/30/2020   May 30, 2021 hemoglobin A1c 5.7   Speciality Comments: No specialty comments available.  Procedures:  No procedures performed Allergies: Atorvastatin, Levofloxacin, and Prednisone   Assessment / Plan:     Visit Diagnoses: Positive ANA (antinuclear antibody) - 06/19/21: ANA 1:1280NH, CRP 3.4, Vitamin B12 406, Vitamin B1 12, Vitamin B6 2.1, TSH 3.78 -patient has  positive ANA.  She denies any history of oral ulcers, nasal ulcers, sicca symptoms, malar rash, photosensitivity, Raynaud's phenomenon or lymphadenopathy.  She had good capillary refill in her hands without any Telengectesia or sclerodactyly.  I will obtain complete autoimmune labs today.  Plan: ANA, Anti-scleroderma antibody, RNP Antibody, Anti-Smith antibody, Anti-DNA antibody, double-stranded, Sjogrens syndrome-B extractable nuclear antibody, Sjogrens syndrome-A extractable nuclear antibody, C3 and C4, Beta-2 glycoprotein antibodies, Cardiolipin antibodies, IgG, IgM, IgA  Paresthesia of both hands-she has been experiencing paresthesias in her hands for the last 3 months.  I offered nerve conduction velocities but she declined.  Plantar fasciitis, bilateral-patient has been under care of Dr. Jacqualyn Posey for [place] fasciitis over the last 3 years.  She states the symptoms improved after using shoes with arch support.  She had no tenderness on my examination today.  She had recent x-rays of her bilateral feet done by Dr. Jacqualyn Posey which were unremarkable.  Paresthesia of both feet -she has been experiencing paresthesias in her bilateral feet.  She was given a prescription of Lyrica by Dr. Jacqualyn Posey.  Patient states even taking Lyrica 75 mg at bedtime makes her very drowsy the next day.  She will request a lower dose.  She has been trying over-the-counter supplement for neuropathic pain which according to her has been helping her.  She had extensive work-up by Dr. Jacqualyn Posey which included B12, B1 and B6 levels which were all unremarkable.  Elevated hemoglobin A1c-her hemoglobin A1c has been mildly elevated in the past.  Dietary modifications and avoidance of sugar intake was discussed.  Patient states she has been trying to lose weight and had intentional weight loss over the last year.  Other medical problems are listed as follows:  Osteopenia of multiple sites  Vitamin D deficiency-her vitamin D level was 76  on May 30, 2021.  Mixed hyperlipidemia  History of gastroesophageal reflux (GERD)  History of IBS  Insomnia due to psychological stress  Other iron deficiency anemia  Anxiety and depression  Orders: Orders Placed This Encounter  Procedures   ANA   Anti-scleroderma antibody   RNP Antibody   Anti-Smith antibody   Anti-DNA antibody, double-stranded   Sjogrens syndrome-B extractable nuclear antibody   Sjogrens syndrome-A extractable nuclear antibody   C3 and C4   Beta-2  glycoprotein antibodies   Cardiolipin antibodies, IgG, IgM, IgA   No orders of the defined types were placed in this encounter.    Follow-Up Instructions: Return for Paresthesia and positive ANA.   Bo Merino, MD  Note - This record has been created using Editor, commissioning.  Chart creation errors have been sought, but may not always  have been located. Such creation errors do not reflect on  the standard of medical care.

## 2021-10-05 ENCOUNTER — Encounter: Payer: Self-pay | Admitting: Rheumatology

## 2021-10-05 ENCOUNTER — Ambulatory Visit: Payer: Medicare HMO | Attending: Rheumatology | Admitting: Rheumatology

## 2021-10-05 ENCOUNTER — Other Ambulatory Visit: Payer: Self-pay | Admitting: Internal Medicine

## 2021-10-05 VITALS — BP 120/79 | HR 97 | Ht 66.5 in | Wt 198.8 lb

## 2021-10-05 DIAGNOSIS — R768 Other specified abnormal immunological findings in serum: Secondary | ICD-10-CM | POA: Diagnosis not present

## 2021-10-05 DIAGNOSIS — M722 Plantar fascial fibromatosis: Secondary | ICD-10-CM

## 2021-10-05 DIAGNOSIS — R69 Illness, unspecified: Secondary | ICD-10-CM | POA: Diagnosis not present

## 2021-10-05 DIAGNOSIS — D508 Other iron deficiency anemias: Secondary | ICD-10-CM

## 2021-10-05 DIAGNOSIS — F419 Anxiety disorder, unspecified: Secondary | ICD-10-CM

## 2021-10-05 DIAGNOSIS — Z8719 Personal history of other diseases of the digestive system: Secondary | ICD-10-CM | POA: Diagnosis not present

## 2021-10-05 DIAGNOSIS — M79671 Pain in right foot: Secondary | ICD-10-CM

## 2021-10-05 DIAGNOSIS — R7309 Other abnormal glucose: Secondary | ICD-10-CM | POA: Diagnosis not present

## 2021-10-05 DIAGNOSIS — R202 Paresthesia of skin: Secondary | ICD-10-CM

## 2021-10-05 DIAGNOSIS — F32A Depression, unspecified: Secondary | ICD-10-CM | POA: Diagnosis not present

## 2021-10-05 DIAGNOSIS — F5102 Adjustment insomnia: Secondary | ICD-10-CM

## 2021-10-05 DIAGNOSIS — E782 Mixed hyperlipidemia: Secondary | ICD-10-CM

## 2021-10-05 DIAGNOSIS — M8589 Other specified disorders of bone density and structure, multiple sites: Secondary | ICD-10-CM | POA: Diagnosis not present

## 2021-10-05 DIAGNOSIS — E559 Vitamin D deficiency, unspecified: Secondary | ICD-10-CM | POA: Diagnosis not present

## 2021-10-05 DIAGNOSIS — Z8659 Personal history of other mental and behavioral disorders: Secondary | ICD-10-CM

## 2021-10-05 DIAGNOSIS — F3341 Major depressive disorder, recurrent, in partial remission: Secondary | ICD-10-CM

## 2021-10-05 DIAGNOSIS — R030 Elevated blood-pressure reading, without diagnosis of hypertension: Secondary | ICD-10-CM

## 2021-10-09 ENCOUNTER — Telehealth: Payer: Self-pay

## 2021-10-10 ENCOUNTER — Other Ambulatory Visit: Payer: Self-pay | Admitting: Nurse Practitioner

## 2021-10-10 ENCOUNTER — Telehealth: Payer: Self-pay | Admitting: Nurse Practitioner

## 2021-10-10 ENCOUNTER — Other Ambulatory Visit: Payer: Self-pay | Admitting: Podiatry

## 2021-10-10 DIAGNOSIS — F411 Generalized anxiety disorder: Secondary | ICD-10-CM

## 2021-10-10 MED ORDER — ALPRAZOLAM 1 MG PO TABS: ORAL_TABLET | ORAL | 0 refills | Status: DC

## 2021-10-10 MED ORDER — PREGABALIN 50 MG PO CAPS: 50.0000 mg | ORAL_CAPSULE | Freq: Two times a day (BID) | ORAL | 0 refills | Status: DC

## 2021-10-10 NOTE — Telephone Encounter (Signed)
Patient states that it is working, but she would like to decrease to the '50mg'$  BID instead.

## 2021-10-10 NOTE — Telephone Encounter (Signed)
Refill has been sent to the pharmacy.  

## 2021-10-10 NOTE — Telephone Encounter (Signed)
Pt says she called the [facility] to get a refill on Xanax. I told her I would have to check with the NP because we are not prescribing Xanax but she says Caryl Pina told her she could have 4 refills. She was very upset, yelling at me and saying I was calling her a liar. So if someone could get back to her soon in regards to that please

## 2021-10-10 NOTE — Progress Notes (Signed)
PDMP is reviewed and appropriate  

## 2021-10-12 LAB — CARDIOLIPIN ANTIBODIES, IGG, IGM, IGA
Anticardiolipin IgA: <2 APL-U/mL (ref <20.0)
Anticardiolipin IgG: <2 GPL-U/mL (ref <20.0)
Anticardiolipin IgM: <2 MPL-U/mL (ref <20.0)

## 2021-10-12 LAB — C3 AND C4
C3 Complement: 199 mg/dL — ABNORMAL HIGH (ref 83–193)
C4 Complement: 42 mg/dL (ref 15–57)

## 2021-10-12 LAB — ANTI-SCLERODERMA ANTIBODY: Scleroderma (Scl-70) (ENA) Antibody, IgG: <1 AI

## 2021-10-12 LAB — RNP ANTIBODY: Ribonucleic Protein(ENA) Antibody, IgG: <1 AI

## 2021-10-12 LAB — ANTI-SMITH ANTIBODY: ENA SM Ab Ser-aCnc: <1 AI

## 2021-10-12 LAB — ANA: Anti Nuclear Antibody (ANA): POSITIVE — AB

## 2021-10-12 LAB — ANTI-NUCLEAR AB-TITER (ANA TITER): ANA Titer 1: 1:1280 {titer} — ABNORMAL HIGH

## 2021-10-12 LAB — SJOGRENS SYNDROME-A EXTRACTABLE NUCLEAR ANTIBODY: SSA (Ro) (ENA) Antibody, IgG: <1 AI

## 2021-10-12 LAB — BETA-2 GLYCOPROTEIN ANTIBODIES
Beta-2 Glyco 1 IgA: <2 U/mL (ref <20.0)
Beta-2 Glyco 1 IgM: <2 U/mL (ref <20.0)
Beta-2 Glyco I IgG: <2 U/mL (ref <20.0)

## 2021-10-12 LAB — ANTI-DNA ANTIBODY, DOUBLE-STRANDED: ds DNA Ab: <1 IU/mL

## 2021-10-12 LAB — SJOGRENS SYNDROME-B EXTRACTABLE NUCLEAR ANTIBODY: SSB (La) (ENA) Antibody, IgG: <1 AI

## 2021-10-12 NOTE — Progress Notes (Signed)
I will discuss results at the follow-up visit.

## 2021-10-16 NOTE — Progress Notes (Signed)
Office Visit Note  Patient: Teresa Vasquez             Date of Birth: Mar 12, 1950           MRN: [id number]             PCP: Unk Pinto, MD Referring: Unk Pinto, MD Visit Date: 10/30/2021 Occupation: '@GUAROCC'$ @  Subjective:  Positive ANA and joint pain  History of Present Illness: Teresa Vasquez is a 71 y.o. female with history of positive ANA and joint pain.  She states she continues to have some stiffness in her hands.  She also has paresthesias in her hands and her feet.  She states that she was given pregabalin by Dr. Jacqualyn Posey.  She noticed that her paresthesias got worse on pregabalin.  She discontinued pregabalin.  She is trying intentional weight loss.  She states she has been successful so far.  She is trying to lose 30 more pounds.  She is been exercising on a regular basis.  She denies any history of oral ulcers, nasal ulcers, malar rash, photosensitivity, Raynaud's phenomenon or lymphadenopathy.  Activities of Daily Living:  Patient reports morning stiffness off and on.  Patient Denies nocturnal pain.  Difficulty dressing/grooming: Denies Difficulty climbing stairs: Denies Difficulty getting out of chair: Denies Difficulty using hands for taps, buttons, cutlery, and/or writing: Denies  Review of Systems  Constitutional:  Negative for fatigue.  HENT:  Negative for mouth sores and mouth dryness.   Eyes:  Negative for dryness.  Respiratory:  Negative for shortness of breath.   Cardiovascular:  Negative for chest pain and palpitations.  Gastrointestinal:  Negative for blood in stool, constipation and diarrhea.  Endocrine: Negative for increased urination.  Genitourinary:  Negative for involuntary urination.  Musculoskeletal:  Positive for morning stiffness. Negative for joint pain, gait problem, joint pain, joint swelling, myalgias, muscle weakness, muscle tenderness and myalgias.  Skin:  Negative for color change, rash, hair loss and sensitivity to sunlight.   Allergic/Immunologic: Negative for susceptible to infections.  Neurological:  Negative for dizziness and headaches.  Hematological:  Negative for swollen glands.  Psychiatric/Behavioral:  Negative for depressed mood and sleep disturbance. The patient is not nervous/anxious.     PMFS History:  Patient Active Problem List   Diagnosis Date Noted   Positive ANA (antinuclear antibody) 08/30/2021   Osteopenia 01/05/2021   Iron deficiency anemia 09/01/2020   Insomnia due to psychological stress 04/20/2020   Plantar fasciitis of left foot 06/16/2018   GERD (gastroesophageal reflux disease) 03/06/2018   Obesity (BMI 30.0-34.9) 08/27/2017   Allergic rhinitis 03/14/2013   Vitamin D deficiency 03/14/2013   Elevated blood pressure reading without diagnosis of hypertension 03/14/2013   Other abnormal glucose (prediabetes) 03/14/2013   Mixed hyperlipidemia 03/14/2013   Anxiety 04/13/2009   Major depressive disorder in partial remission ([place]) 04/13/2009   IRRITABLE BOWEL SYNDROME 04/13/2009    Past Medical History:  Diagnosis Date   Anxiety and depression    Cholecystitis, acute with cholelithiasis 12/17/2013   Depression    Diverticulitis    DIVERTICULITIS 04/19/2009   Qualifier: Diagnosis of  By: Deatra Ina MD, Sandy Salaam    Hyperlipidemia    Hypertension    Irritable bowel syndrome     Family History  Problem Relation Age of Onset   Heart disease Mother    Hypertension Mother    Osteoporosis Mother    Heart disease Father    Heart attack Father 89   Irritable bowel syndrome Sister  Brain cancer Sister 13   Heart attack Brother 73   Heart disease Brother    Throat cancer Brother        remote smoker   Cancer Paternal Aunt        oral   Healthy Son    Past Surgical History:  Procedure Laterality Date   CHOLECYSTECTOMY N/A 12/17/2013   Procedure: LAPAROSCOPIC CHOLECYSTECTOMY WITH INTRAOPERATIVE CHOLANGIOGRAM;  Surgeon: Fanny Skates, MD;  Location: [place];  Service: General;   Laterality: N/A;   COLON SURGERY  2012   Diverticulosis   KNEE ARTHROSCOPY Right 2019   x 2   TONSILLECTOMY     VAGINAL HYSTERECTOMY     Social History   Social History Narrative   Daily caffine use.   Immunization History  Administered Date(s) Administered   Influenza, High Dose Seasonal PF 04/10/2018   PFIZER(Purple Top)SARS-COV-2 Vaccination 07/06/2019, 07/27/2019   Pneumococcal Conjugate-13 04/21/2019   Pneumococcal Polysaccharide-23 04/20/2020   Tdap 08/27/2012     Objective: Vital Signs: BP 123/71 (BP Location: Left Arm, Patient Position: Sitting, Cuff Size: Normal)   Pulse 98   Resp 15   Ht '5\' 6"'$  (1.676 m)   Wt 196 lb 12.8 oz (89.3 kg)   BMI 31.76 kg/m    Physical Exam Vitals and nursing note reviewed.  Constitutional:      Appearance: She is well-developed.  HENT:     Head: Normocephalic and atraumatic.  Eyes:     Conjunctiva/sclera: Conjunctivae normal.  Cardiovascular:     Rate and Rhythm: Normal rate and regular rhythm.     Heart sounds: Normal heart sounds.  Pulmonary:     Effort: Pulmonary effort is normal.     Breath sounds: Normal breath sounds.  Abdominal:     General: Bowel sounds are normal.     Palpations: Abdomen is soft.  Musculoskeletal:     Cervical back: Normal range of motion.  Lymphadenopathy:     Cervical: No cervical adenopathy.  Skin:    General: Skin is warm and dry.     Capillary Refill: Capillary refill takes less than 2 seconds.  Neurological:     Mental Status: She is alert and oriented to person, place, and time.  Psychiatric:        Behavior: Behavior normal.      Musculoskeletal Exam: Cervical spine was in good range of motion.  Shoulder joints, elbow joints, wrist joints with good range of motion.  She had bilateral DIP thickening consistent with osteoarthritis.  Hip joints had limited abduction.  Knee joints with good range of motion without any warmth swelling or effusion.  There was no tenderness over ankles or  MTPs.  CDAI Exam: CDAI Score: -- Patient Global: --; Provider Global: -- Swollen: --; Tender: -- Joint Exam 10/30/2021   No joint exam has been documented for this visit   There is currently no information documented on the homunculus. Go to the Rheumatology activity and complete the homunculus joint exam.  Investigation: No additional findings.  Imaging: No results found.  Recent Labs: Lab Results  Component Value Date   WBC 6.7 08/30/2021   HGB 14.0 08/30/2021   PLT 199 08/30/2021   NA 145 08/30/2021   K 4.7 08/30/2021   CL 110 08/30/2021   CO2 26 08/30/2021   GLUCOSE 94 08/30/2021   BUN 13 08/30/2021   CREATININE 1.00 08/30/2021   BILITOT 0.6 08/30/2021   ALKPHOS 100 08/01/2016   AST 13 08/30/2021   ALT 7 08/30/2021  PROT 7.1 08/30/2021   ALBUMIN 4.3 08/01/2016   CALCIUM 10.0 08/30/2021   GFRAA 88 08/30/2020   October 05, 2021 ANA 1: 1280NH, ENA negative, C3-C4 normal, anticardiolipin negative, beta-2 GP 1 negative  Speciality Comments: No specialty comments available.  Procedures:  No procedures performed Allergies: Atorvastatin, Levofloxacin, and Prednisone   Assessment / Plan:     Visit Diagnoses: Positive ANA (antinuclear antibody) - Positive ANA, ENA negative, complements normal.  I did detailed discussion with the patient regarding the lab findings.  Her ANA is significantly positive but she has no clinical features of autoimmune disease.  I advised her to contact me if she develops any new symptoms.  .  Primary osteoarthritis of both hands-she had bilateral DIP thickening and some discomfort in her hands.  Clinical findings were consistent with osteoarthritis.  Joint protection muscle strengthening was discussed.  Paresthesia of both hands - History of paresthesias of bilateral hands since May 2023.  Patient declined nerve conduction velocities.  Use of carpal tunnel braces was discussed.  Plantar fasciitis, bilateral - She has been under care of Dr.  Earleen Newport for the last 3 years.  Paresthesia of both feet -patient discontinued Lyrica as she felt that it made her paresthesias worse.  Elevated hemoglobin A1c - Mildly elevated.  Osteopenia of multiple sites-I do not results available.  Use of calcium rich diet and exercise was discussed.  Vitamin D deficiency  Mixed hyperlipidemia  History of gastroesophageal reflux (GERD)  History of IBS  Insomnia due to psychological stress  Other iron deficiency anemia  Anxiety and depression  Orders: No orders of the defined types were placed in this encounter.  No orders of the defined types were placed in this encounter.  .  Follow-Up Instructions: Return in about 1 year (around 10/31/2022) for Positive ANA, osteoarthritis.   Bo Merino, MD  Note - This record has been created using Editor, commissioning.  Chart creation errors have been sought, but may not always  have been located. Such creation errors do not reflect on  the standard of medical care.

## 2021-10-29 DIAGNOSIS — H524 Presbyopia: Secondary | ICD-10-CM | POA: Diagnosis not present

## 2021-10-29 DIAGNOSIS — Z0100 Encounter for examination of eyes and vision without abnormal findings: Secondary | ICD-10-CM | POA: Diagnosis not present

## 2021-10-30 ENCOUNTER — Ambulatory Visit: Payer: BC Managed Care – PPO | Attending: Rheumatology | Admitting: Rheumatology

## 2021-10-30 ENCOUNTER — Encounter: Payer: Self-pay | Admitting: Rheumatology

## 2021-10-30 VITALS — BP 123/71 | HR 98 | Resp 15 | Ht 66.0 in | Wt 196.8 lb

## 2021-10-30 DIAGNOSIS — M8589 Other specified disorders of bone density and structure, multiple sites: Secondary | ICD-10-CM | POA: Diagnosis not present

## 2021-10-30 DIAGNOSIS — R202 Paresthesia of skin: Secondary | ICD-10-CM

## 2021-10-30 DIAGNOSIS — D508 Other iron deficiency anemias: Secondary | ICD-10-CM | POA: Diagnosis not present

## 2021-10-30 DIAGNOSIS — M722 Plantar fascial fibromatosis: Secondary | ICD-10-CM

## 2021-10-30 DIAGNOSIS — M19041 Primary osteoarthritis, right hand: Secondary | ICD-10-CM | POA: Diagnosis not present

## 2021-10-30 DIAGNOSIS — F32A Depression, unspecified: Secondary | ICD-10-CM

## 2021-10-30 DIAGNOSIS — R768 Other specified abnormal immunological findings in serum: Secondary | ICD-10-CM | POA: Diagnosis not present

## 2021-10-30 DIAGNOSIS — E782 Mixed hyperlipidemia: Secondary | ICD-10-CM

## 2021-10-30 DIAGNOSIS — F5102 Adjustment insomnia: Secondary | ICD-10-CM

## 2021-10-30 DIAGNOSIS — E559 Vitamin D deficiency, unspecified: Secondary | ICD-10-CM

## 2021-10-30 DIAGNOSIS — F419 Anxiety disorder, unspecified: Secondary | ICD-10-CM

## 2021-10-30 DIAGNOSIS — R7309 Other abnormal glucose: Secondary | ICD-10-CM

## 2021-10-30 DIAGNOSIS — M19042 Primary osteoarthritis, left hand: Secondary | ICD-10-CM

## 2021-10-30 DIAGNOSIS — R69 Illness, unspecified: Secondary | ICD-10-CM | POA: Diagnosis not present

## 2021-10-30 DIAGNOSIS — Z8719 Personal history of other diseases of the digestive system: Secondary | ICD-10-CM | POA: Diagnosis not present

## 2021-10-30 NOTE — Patient Instructions (Signed)
Hand Exercises Hand exercises can be helpful for almost anyone. These exercises can strengthen the hands, improve flexibility and movement, and increase blood flow to the hands. These results can make work and daily tasks easier. Hand exercises can be especially helpful for people who have joint pain from arthritis or have nerve damage from overuse (carpal tunnel syndrome). These exercises can also help people who have injured a hand. Exercises Most of these hand exercises are gentle stretching and motion exercises. It is usually safe to do them often throughout the day. Warming up your hands before exercise may help to reduce stiffness. You can do this with gentle massage or by placing your hands in warm water for 10-15 minutes. It is normal to feel some stretching, pulling, tightness, or mild discomfort as you begin new exercises. This will gradually improve. Stop an exercise right away if you feel sudden, severe pain or your pain gets worse. Ask your health care provider which exercises are best for you. Knuckle bend or "claw" fist  Stand or sit with your arm, hand, and all five fingers pointed straight up. Make sure to keep your wrist straight during the exercise. Gently bend your fingers down toward your palm until the tips of your fingers are touching the top of your palm. Keep your big knuckle straight and just bend the small knuckles in your fingers. Hold this position for __________ seconds. Straighten (extend) your fingers back to the starting position. Repeat this exercise 5-10 times with each hand. Full finger fist  Stand or sit with your arm, hand, and all five fingers pointed straight up. Make sure to keep your wrist straight during the exercise. Gently bend your fingers into your palm until the tips of your fingers are touching the middle of your palm. Hold this position for __________ seconds. Extend your fingers back to the starting position, stretching every joint fully. Repeat  this exercise 5-10 times with each hand. Straight fist Stand or sit with your arm, hand, and all five fingers pointed straight up. Make sure to keep your wrist straight during the exercise. Gently bend your fingers at the big knuckle, where your fingers meet your hand, and the middle knuckle. Keep the knuckle at the tips of your fingers straight and try to touch the bottom of your palm. Hold this position for __________ seconds. Extend your fingers back to the starting position, stretching every joint fully. Repeat this exercise 5-10 times with each hand. Tabletop  Stand or sit with your arm, hand, and all five fingers pointed straight up. Make sure to keep your wrist straight during the exercise. Gently bend your fingers at the big knuckle, where your fingers meet your hand, as far down as you can while keeping the small knuckles in your fingers straight. Think of forming a tabletop with your fingers. Hold this position for __________ seconds. Extend your fingers back to the starting position, stretching every joint fully. Repeat this exercise 5-10 times with each hand. Finger spread  Place your hand flat on a table with your palm facing down. Make sure your wrist stays straight as you do this exercise. Spread your fingers and thumb apart from each other as far as you can until you feel a gentle stretch. Hold this position for __________ seconds. Bring your fingers and thumb tight together again. Hold this position for __________ seconds. Repeat this exercise 5-10 times with each hand. Making circles  Stand or sit with your arm, hand, and all five fingers pointed   straight up. Make sure to keep your wrist straight during the exercise. Make a circle by touching the tip of your thumb to the tip of your index finger. Hold for __________ seconds. Then open your hand wide. Repeat this motion with your thumb and each finger on your hand. Repeat this exercise 5-10 times with each hand. Thumb  motion  Sit with your forearm resting on a table and your wrist straight. Your thumb should be facing up toward the ceiling. Keep your fingers relaxed as you move your thumb. Lift your thumb up as high as you can toward the ceiling. Hold for __________ seconds. Bend your thumb across your palm as far as you can, reaching the tip of your thumb for the small finger (pinkie) side of your palm. Hold for __________ seconds. Repeat this exercise 5-10 times with each hand. Grip strengthening  Hold a stress ball or other soft ball in the middle of your hand. Slowly increase the pressure, squeezing the ball as much as you can without causing pain. Think of bringing the tips of your fingers into the middle of your palm. All of your finger joints should bend when doing this exercise. Hold your squeeze for __________ seconds, then relax. Repeat this exercise 5-10 times with each hand. Contact a health care provider if: Your hand pain or discomfort gets much worse when you do an exercise. Your hand pain or discomfort does not improve within 2 hours after you exercise. If you have any of these problems, stop doing these exercises right away. Do not do them again unless your health care provider says that you can. Get help right away if: You develop sudden, severe hand pain or swelling. If this happens, stop doing these exercises right away. Do not do them again unless your health care provider says that you can. This information is not intended to replace advice given to you by your health care provider. Make sure you discuss any questions you have with your health care provider. Document Revised: 06/08/2020 Document Reviewed: 06/08/2020 Elsevier Patient Education  2023 [employer]vier Inc.  

## 2021-11-12 ENCOUNTER — Ambulatory Visit (INDEPENDENT_AMBULATORY_CARE_PROVIDER_SITE_OTHER): Payer: Medicare HMO | Admitting: Podiatry

## 2021-11-12 DIAGNOSIS — M722 Plantar fascial fibromatosis: Secondary | ICD-10-CM | POA: Diagnosis not present

## 2021-11-12 DIAGNOSIS — G629 Polyneuropathy, unspecified: Secondary | ICD-10-CM | POA: Diagnosis not present

## 2021-11-12 NOTE — Progress Notes (Signed)
Subjective: Chief Complaint  Patient presents with   Peripheral Neuropathy    Patient came in today for a follow-up for plantar fasciits and neuropathy, pt has seen rheumatology, here to go over the next steps,     71 year old female presents the office today for follow-up evaluation of neuropathy as well as foot pain.  She is describing to be stinging sensation to her feet and legs.  She also gets symptoms in her hands.  She also gets pain to the bottoms of her feet.  No pain today.  No recent injury or changes.  Objective: AAO x3, NAD DP/PT pulses palpable bilaterally, CRT less than 3 seconds There is no significant skin discoloration changes noted today.  There is no significant tenderness palpation on the plantar aspect calcaneal insertion of plantar fascia today.  There is no pain with lateral compression of calcaneus.  No edema, erythema.  Flexor, extensor tendons appear to be intact.  No Tinel sign. No pain with calf compression, swelling, warmth, erythema  Assessment: Plantar fasciitis, positive ANA  Plan: -All treatment options discussed with the patient including all alternatives, risks, complications.  -Still describing bee sting, nerve symptoms.  I do think she is at 100 fasciitis and some other foot issues as well but the symptoms are not typical with that.  I like for her to follow-up with neurology.  We discussed testing but she does not want to have a nerve conduction test we will see what neurology says.  I can always do a nerve biopsy as well. -Continue shoes and good arch support as well as stretching, icing a regular basis.  Trula Slade DPM

## 2021-11-21 ENCOUNTER — Other Ambulatory Visit: Payer: Self-pay

## 2021-11-21 DIAGNOSIS — E669 Obesity, unspecified: Secondary | ICD-10-CM

## 2021-11-21 MED ORDER — TOPIRAMATE 50 MG PO TABS: ORAL_TABLET | ORAL | 1 refills | Status: DC

## 2021-11-25 ENCOUNTER — Encounter: Payer: Self-pay | Admitting: Nurse Practitioner

## 2021-11-26 ENCOUNTER — Other Ambulatory Visit: Payer: Self-pay | Admitting: Nurse Practitioner

## 2021-11-26 DIAGNOSIS — F411 Generalized anxiety disorder: Secondary | ICD-10-CM

## 2021-11-26 MED ORDER — HYDROXYZINE HCL 25 MG PO TABS: ORAL_TABLET | ORAL | 0 refills | Status: DC

## 2021-12-10 ENCOUNTER — Other Ambulatory Visit: Payer: Self-pay | Admitting: Nurse Practitioner

## 2021-12-10 DIAGNOSIS — F411 Generalized anxiety disorder: Secondary | ICD-10-CM

## 2021-12-10 MED ORDER — ALPRAZOLAM 0.25 MG PO TABS: 0.2500 mg | ORAL_TABLET | Freq: Three times a day (TID) | ORAL | 0 refills | Status: DC | PRN

## 2022-01-07 ENCOUNTER — Other Ambulatory Visit: Payer: Self-pay | Admitting: Nurse Practitioner

## 2022-01-07 ENCOUNTER — Telehealth: Payer: Self-pay | Admitting: Nurse Practitioner

## 2022-01-07 MED ORDER — OMEPRAZOLE 40 MG PO CPDR: DELAYED_RELEASE_CAPSULE | ORAL | 3 refills | Status: DC

## 2022-01-07 NOTE — Telephone Encounter (Signed)
Patient is requesting a refill on Omeprazole to [facility] on [address].

## 2022-01-11 ENCOUNTER — Encounter: Payer: Self-pay | Admitting: Diagnostic Neuroimaging

## 2022-01-11 ENCOUNTER — Ambulatory Visit: Payer: Medicare HMO | Admitting: Diagnostic Neuroimaging

## 2022-01-11 VITALS — BP 129/69 | HR 90 | Ht 66.0 in | Wt 192.6 lb

## 2022-01-11 DIAGNOSIS — G629 Polyneuropathy, unspecified: Secondary | ICD-10-CM

## 2022-01-11 MED ORDER — AMITRIPTYLINE HCL 25 MG PO TABS: 25.0000 mg | ORAL_TABLET | Freq: Every day | ORAL | 3 refills | Status: DC

## 2022-01-11 NOTE — Progress Notes (Signed)
[facility]  PATIENT: Teresa Vasquez DOB: 07/28/1950  REFERRING CLINICIAN: Trula Slade, DPM HISTORY FROM: patient  REASON FOR VISIT: new consult    HISTORICAL  CHIEF COMPLAINT:  Chief Complaint  Patient presents with   New Patient (Initial Visit)    Rm 7, alone, sx of neuropathy hands, feet, legs.  67month(tingling, burning).  Plantar fascitis prior to that.     HISTORY OF PRESENT ILLNESS:   71year old female here for evaluation of numbness and tingling.  Symptoms started around March 2023.  She had tingling and numbness in her toes feet and legs up to her knees, then in her fingers and hands.  Has had extensive neuropathy lab work-up which have been unremarkable.  Has tried gabapentin and Lyrica but had side effects and could not tolerate, without relief.    REVIEW OF SYSTEMS: Full 14 system review of systems performed and negative with exception of: as per HPI.  ALLERGIES: Allergies  Allergen Reactions   Atorvastatin Other (See Comments)    Leg cramps   Levofloxacin    Prednisone     HOME MEDICATIONS: Outpatient Medications Prior to Visit  Medication Sig Dispense Refill   ALPRAZolam (XANAX) 0.25 MG tablet Take 1 tablet (0.25 mg total) by mouth 3 (three) times daily as needed for anxiety. Limit to 5 days/week to avoid addiction 30 tablet 0   aspirin 81 MG tablet Take 81 mg by mouth daily.      Cholecalciferol (VITAMIN D3) 125 MCG (5000 UT) CAPS Takes 2 capsules (10,000 units )  Daily     CINNAMON PO Take 1,200 mg by mouth.     ELDERBERRY PO Take by mouth. Calcium and vit c in this supplement     Ferrous Sulfate (IRON PO) Take 18 mg by mouth.     fluticasone (FLONASE) 50 MCG/ACT nasal spray Use  2 sprays  to each Nostril  Daily (Patient taking differently: as needed. Use  2 sprays  to each Nostril  Daily) 48 mL 3   montelukast (SINGULAIR) 10 MG tablet Take  1 tablet  Daily for Allergies                                            /                 TAKE 1 TABLET BY MOUTH (Patient taking differently: as needed. Take  1 tablet  Daily for Allergies                                            /                TAKE 1 TABLET BY MOUTH) 90 tablet 3   omeprazole (PRILOSEC) 40 MG capsule Take  1 capsule  Daily to Prevent Heartburn & Indigestion                                 /                              TAKE  BY                     MOUTH                        ONCE DAILY 90 capsule 3   OVER THE COUNTER MEDICATION Take 2 capsules by mouth daily. Online OTC NERVE REGEN.     rosuvastatin (CRESTOR) 5 MG tablet TAKE 1 TAB DAILY FOR CHOLESTEROL. 90 tablet 1   triamcinolone cream (KENALOG) 0.5 % APPLY TO AFFECTED AREA TWICE A DAY 75 g 3   Zinc 50 MG TABS Take by mouth.     Calcium Carb-Cholecalciferol (CALCIUM 500+D3) 500-5 MG-MCG TABS Take by mouth.     phentermine (ADIPEX-P) 37.5 MG tablet Take  1/2 to 1 tablet  every Morning  for Dieting & Weight Loss (Patient not taking: Reported on 01/11/2022) 90 tablet 0   topiramate (TOPAMAX) 50 MG tablet TAKE 1/2 TO 1 TABLET 2 X /DAY AT SUPPERTIME & BEDTIME FOR DIETING & WEIGHT LOSS (Patient not taking: Reported on 01/11/2022) 180 tablet 1   hydrOXYzine (ATARAX) 25 MG tablet 1/2-1 tab at bedtime 60 tablet 0   vitamin C (ASCORBIC ACID) 250 MG tablet Take 250 mg by mouth daily.     No facility-administered medications prior to visit.    PAST MEDICAL HISTORY: Past Medical History:  Diagnosis Date   Anxiety and depression    Cholecystitis, acute with cholelithiasis 12/17/2013   Depression    Diverticulitis    DIVERTICULITIS 04/19/2009   Qualifier: Diagnosis of  By: Deatra Ina MD, Sandy Salaam    Hyperlipidemia    Hypertension    Irritable bowel syndrome     PAST SURGICAL HISTORY: Past Surgical History:  Procedure Laterality Date   CHOLECYSTECTOMY N/A 12/17/2013   Procedure: LAPAROSCOPIC CHOLECYSTECTOMY WITH INTRAOPERATIVE CHOLANGIOGRAM;  Surgeon: Fanny Skates, MD;  Location: [facility];   Service: General;  Laterality: N/A;   COLON SURGERY  2012   Diverticulosis   KNEE ARTHROSCOPY Right 2019   x 2   TONSILLECTOMY     VAGINAL HYSTERECTOMY      FAMILY HISTORY: Family History  Problem Relation Age of Onset   Heart disease Mother    Hypertension Mother    Osteoporosis Mother    Heart disease Father    Heart attack Father 21   Irritable bowel syndrome Sister    Brain cancer Sister 66   Heart attack Brother 8   Heart disease Brother    Throat cancer Brother        remote smoker   Cancer Paternal Aunt        oral   Healthy Son     SOCIAL HISTORY: Social History   Socioeconomic History   Marital status: Married    Spouse name: Not on file   Number of children: 1   Years of education: Not on file   Highest education level: Not on file  Occupational History   Occupation: Lobbyist: [employer]   Occupation: Retired    Fish farm manager: Psychologist, sport and exercise [place]  Tobacco Use   Smoking status: Never    Passive exposure: Never   Smokeless tobacco: Never  Vaping Use   Vaping Use: Never used  Substance and Sexual Activity   Alcohol use: No   Drug use: No   Sexual activity: Yes    Partners: Male    Birth control/protection: Post-menopausal  Other Topics Concern   Not on  file  Social History Narrative   Daily caffiene use.  Coke or tea 2-3 daily   Education: HS grad   Work: retired (worked Education administrator).    Poll work.    Social Determinants of Health   Financial Resource Strain: Not on file  Food Insecurity: Not on file  Transportation Needs: Not on file  Physical Activity: Inactive (03/09/2018)   Exercise Vital Sign    Days of Exercise per Week: 0 days    Minutes of Exercise per Session: 0 min  Stress: No Stress Concern Present (03/09/2018)   [place]    Feeling of Stress : Only a little  Social Connections: Not on file  Intimate Partner Violence: Not on file      PHYSICAL EXAM  GENERAL EXAM/CONSTITUTIONAL: Vitals:  Vitals:   01/11/22 1147  BP: 129/69  Pulse: 90  Weight: 192 lb 9.6 oz (87.4 kg)  Height: _0  (1.676 m)   Body mass index is 31.09 kg/m. Wt Readings from Last 3 Encounters:  01/11/22 192 lb 9.6 oz (87.4 kg)  10/30/21 196 lb 12.8 oz (89.3 kg)  10/05/21 198 lb 12.8 oz (90.2 kg)   Patient is in no distress; well developed, nourished and groomed; neck is supple  CARDIOVASCULAR: Examination of carotid arteries is normal; no carotid bruits Regular rate and rhythm, no murmurs Examination of peripheral vascular system by observation and palpation is normal  EYES: Ophthalmoscopic exam of optic discs and posterior segments is normal; no papilledema or hemorrhages No results found.  MUSCULOSKELETAL: Gait, strength, tone, movements noted in Neurologic exam below  NEUROLOGIC: MENTAL STATUS:      No data to display         awake, alert, oriented to person, place and time recent and remote memory intact normal attention and concentration language fluent, comprehension intact, naming intact fund of knowledge appropriate  CRANIAL NERVE:  2nd - no papilledema on fundoscopic exam 2nd, 3rd, 4th, 6th - pupils equal and reactive to light, visual fields full to confrontation, extraocular muscles intact, no nystagmus 5th - facial sensation symmetric 7th - facial strength symmetric 8th - hearing intact 9th - palate elevates symmetrically, uvula midline 11th - shoulder shrug symmetric 12th - tongue protrusion midline  MOTOR:  normal bulk and tone, full strength in the BUE, BLE  SENSORY:  normal and symmetric to light touch, pinprick, temperature, vibration; DECR IN FEET / ANKLE  COORDINATION:  finger-nose-finger, fine finger movements normal  REFLEXES:  deep tendon reflexes TRACE and symmetric  GAIT/STATION:  narrow based gait     DIAGNOSTIC DATA (LABS, IMAGING, TESTING) - I reviewed patient records, labs,  notes, testing and imaging myself where available.  Lab Results  Component Value Date   WBC 6.7 08/30/2021   HGB 14.0 08/30/2021   HCT 41.5 08/30/2021   MCV 90.2 08/30/2021   PLT 199 08/30/2021      Component Value Date/Time   NA 145 08/30/2021 1444   K 4.7 08/30/2021 1444   CL 110 08/30/2021 1444   CO2 26 08/30/2021 1444   GLUCOSE 94 08/30/2021 1444   BUN 13 08/30/2021 1444   CREATININE 1.00 08/30/2021 1444   CALCIUM 10.0 08/30/2021 1444   PROT 7.1 08/30/2021 1444   ALBUMIN 4.3 08/01/2016 1038   AST 13 08/30/2021 1444   ALT 7 08/30/2021 1444   ALKPHOS 100 08/01/2016 1038   BILITOT 0.6 08/30/2021 1444   GFRNONAA 76 08/30/2020 1438   GFRAA 88 08/30/2020  1438   Lab Results  Component Value Date   CHOL 187 08/30/2021   HDL 36 (L) 08/30/2021   LDLCALC 125 (H) 08/30/2021   TRIG 149 08/30/2021   CHOLHDL 5.2 (H) 08/30/2021   Lab Results  Component Value Date   HGBA1C 5.7 (H) 05/30/2021   Lab Results  Component Value Date   VITAMINB12 406 06/19/2021   Lab Results  Component Value Date   TSH 3.75 08/30/2021         Component Ref Range & Units 3 mo ago 6 mo ago  ANA Titer 1 titer 1:1,280 High  1:1,280 High  CM  Comment:                 Reference Range                 <1:40        Negative                 1:40-1:80    Low Antibody Level                 >1:80        Elevated Antibody Level .  ANA Pattern 1  Nuclear, Homogeneous Abnormal  Nuclear, Homogeneous Abnormal  CM  Comment: Homogeneous pattern is associated with systemic lupus erythematosus (SLE), drug-induced lupus and juvenile idiopathic arthritis. . AC-1: Homogeneous . International Consensus on ANA Patterns ([email or web address]/)  Resulting Agency  [facility]         Component Ref Range & Units 3 mo ago  C3 Complement 83 - 193 mg/dL 199 High   C4 Complement 15 - 57 mg/dL 42     LABS - SSA, SSB, B1, B6, B12 --> all  normal   ASSESSMENT AND PLAN  71 y.o. year old female here with:  Meds tried: gabapentin, pregabalin  Dx:  1. Neuropathy      PLAN:  INTERMITTENT NUMBNESS / PAIN (feet, hands; since ~March 2023) - idiopathic neuropathy; lab workup negative so far; will check few other labs to rule out other causes - continue vitamin support (nerve regen formula, OTC) - add amitriptyline 58m at bedtime (for nerve pain, anxiety, insomnia)  Orders Placed This Encounter  Procedures   Angiotensin converting enzyme   Multiple Myeloma Panel (SPEP&IFE w/QIG)   ANCA Profile   Meds ordered this encounter  Medications   amitriptyline (ELAVIL) 25 MG tablet    Sig: Take 1 tablet (25 mg total) by mouth at bedtime.    Dispense:  30 tablet    Refill:  3   Return for pending if symptoms worsen or fail to improve, pending test results.    VPenni Bombard MD 158/83/2549 182:64PM Certified in Neurology, Neurophysiology and Neuroimaging  [facility] [address] [place] [phone]

## 2022-01-11 NOTE — Patient Instructions (Signed)
INTERMITTENT NUMBNESS / PAIN (feet, hands; since ~March 2023) - idiopathic neuropathy; check few other labs to rule out causes - continue vitamin support (nerve regen formula, OTC) - add amitriptyline '25mg'$  at bedtime (for nerve pain, anxiety, insomnia)

## 2022-01-15 ENCOUNTER — Other Ambulatory Visit: Payer: Medicare HMO

## 2022-01-15 DIAGNOSIS — G629 Polyneuropathy, unspecified: Secondary | ICD-10-CM

## 2022-01-15 NOTE — Progress Notes (Unsigned)
4 MONTH FOLLOW UP   Assessment and Plan:   Allergic rhinitis due to pollen, unspecified seasonality Continue allergy medication  Irritable bowel syndrome, unspecified type If not on benefiber then add it, decrease stress,  if any worsening symptoms, blood in stool, AB pain, etc call office  Elevated blood pressure reading without diagnosis of hypertension - continue medications, DASH diet, exercise and monitor at home. Call if greater than 130/80.  -     CBC with Differential/Platelet -     CMP/GFR  Other abnormal glucose Discussed disease progression and risks Discussed diet/exercise, weight management and risk modification  Mixed hyperlipidemia -continue medications - rosuvastatin 5 mg daily  - LDL goal <100, check lipids, decrease fatty foods, increase activity.  -     Lipid panel -     TSH -     CMP/GFR  Vitamin D deficiency Continue Vit D supplementation to maintain value in therapeutic level of 60-100  -     VITAMIN D 25 Hydroxy (Vit-D Deficiency, Fractures)  Obesity- BMI 32 Long discussion about weight loss, diet, and exercise Recommended diet heavy in fruits and veggies and low in animal meats, cheeses, and dairy products, appropriate calorie intake Discussed appropriate weight for height  Continue Phentermine and Topamax, excellent progress.  Follow up at next visit  Medication management -     Magnesium  Estrogen def/Osteopenia DEXA UTD Encouraged high calcium diet, vitamin D supplement as indicated and weight bearing exercises  Recurrent depression in partial remission /Anxiety state/ insomnia  Currently only using Alprazolam 1 mg PRN for sleep  Last script for Alprazolam 1 mg #60 filled on 08/16/2021 Lifestyle discussed: diet/exerise, sleep hygiene, stress management, hydration  - discussed: New guidelines suggest the benzodiazepines are best short term, with prolonged use they lead to physical and psychological dependence. In addition, evidence suggest  that for insomnia the effectiveness wanes in 4 weeks and the risks out weight their benefits. Use of these agents have been associated with dementia, falls, motor vehicle accidents and physical addiction. Decreasing these medication have been proven to show improvements in cognition, alertness, decrease of falls and daytime sedation.   - she is not ready to fully commit to tapering off, difficult time with her husband, receptive to trying benadryl, trying 1/2 tab xanax only when doing well, receptive to revisit taper discussion at next visit.   GERD Continue diet modifications - Magnesium   Discussed med's effects and SE's. Screening labs and tests as requested with regular follow-up as recommended. Future Appointments  Date Time Provider [place]  01/16/2022  2:30 PM Alycia Rossetti, NP [facility] None  06/03/2022  3:00 PM Darrol Jump, NP [facility] None  09/02/2022  3:00 PM Darrol Jump, NP [facility] None  10/30/2022  1:00 PM Deveshwar, Abel Presto, MD [facility] None    Plan:   During the course of the visit the patient was educated and counseled about appropriate screening and preventive services including:   Pneumococcal vaccine  Prevnar 13 Influenza vaccine Td vaccine Screening electrocardiogram Bone densitometry screening Colorectal cancer screening Diabetes screening Glaucoma screening Nutrition counseling  Advanced directives: requested    HPI  71 y.o. female  presents for an AWV and follow up. She has Anxiety; Major depressive disorder in partial remission ([place]); IRRITABLE BOWEL SYNDROME; Allergic rhinitis; Vitamin D deficiency; Elevated blood pressure reading without diagnosis of hypertension; Other abnormal glucose (prediabetes); Mixed hyperlipidemia; Obesity (BMI 30.0-34.9); GERD (gastroesophageal reflux disease); Plantar fasciitis of left foot; Insomnia due to psychological stress; Iron deficiency  anemia; Osteopenia; and Positive ANA (antinuclear antibody) on  their problem list.   She is married, she has 1 child and 2 grandkids, watches occasionally.  She is retired but still helps with polling in [employer]  Frustrated because husband has been moody and irritable this past year, not acting himself.   Hx of MDD/anxiety in remission, off of lexapro, has xanax 1 mg script but reports only using at night for sleep. Never during the day. She intermittently uses benadryl as well.   She is following with Dr. Harrington Challenger for GYN every 2 years, saw 02/01/2021 and had mammogram and pelvic, has upcoming this year.  Has hysterectomy sparing ovaries in 1992.   IBS doing better since her weight loss efforts.   Recently saw podiatry Dr. Jacqualyn Posey for foot pain, had sig + ANA 1:1280 in 06/19/2021 and was referred to rheum Dr. Estanislado Pandy, has pending 10/05/2021.  BMI is There is no height or weight on file to calculate BMI., she has been working on diet and exercise, down 12 lb since last visit.  She has been prescribed phentermine/topamax for weight loss with benefit, down 12 lb since last visit.  Wt Readings from Last 3 Encounters:  01/11/22 192 lb 9.6 oz (87.4 kg)  10/30/21 196 lb 12.8 oz (89.3 kg)  10/05/21 198 lb 12.8 oz (90.2 kg)   Her blood pressure has been controlled at home, today their BP is  .   BP Readings from Last 3 Encounters:  01/11/22 129/69  10/30/21 123/71  10/05/21 120/79   She does not workout. She denies chest pain, shortness of breath, dizziness.   She is on cholesterol medication (rosuvastatin 5 mg daily, has increased from 3 days a week, hx of myalgia with atorvastatin) and denies myalgias. Her cholesterol is not at goal of LDL <100. The cholesterol last visit was:  Lab Results  Component Value Date   CHOL 187 08/30/2021   HDL 36 (L) 08/30/2021   LDLCALC 125 (H) 08/30/2021   TRIG 149 08/30/2021   CHOLHDL 5.2 (H) 08/30/2021  . She hasn't been working on diet and exercise for hx of prediabetes (5.7% in 2015, 5.8% in 03/2018), she is on  bASA, she is not on ACE/ARB and denies foot ulcerations, hyperglycemia, hypoglycemia , increased appetite, nausea, paresthesia of the feet, polydipsia, polyuria, visual disturbances, vomiting and weight loss. Last A1C in the office was:  Lab Results  Component Value Date   HGBA1C 5.7 (H) 05/30/2021    Last GFR:  Lab Results  Component Value Date   EGFR 61 08/30/2021   Patient is on Vitamin D supplement, taking 10000 IU daily    Lab Results  Component Value Date   VD25OH 76 05/30/2021     Last GFR:  Lab Results  Component Value Date   GFRNONAA 76 08/30/2020   She has been on an iron supplement for mild deficiency, no anemia -  Lab Results  Component Value Date   IRON 94 08/30/2021   TIBC 279 08/30/2021   FERRITIN 29 08/30/2021     Current Medications:  Current Outpatient Medications on File Prior to Visit  Medication Sig Dispense Refill   ALPRAZolam (XANAX) 0.25 MG tablet Take 1 tablet (0.25 mg total) by mouth 3 (three) times daily as needed for anxiety. Limit to 5 days/week to avoid addiction 30 tablet 0   amitriptyline (ELAVIL) 25 MG tablet Take 1 tablet (25 mg total) by mouth at bedtime. 30 tablet 3   aspirin 81 MG tablet  Take 81 mg by mouth daily.      Cholecalciferol (VITAMIN D3) 125 MCG (5000 UT) CAPS Takes 2 capsules (10,000 units )  Daily     CINNAMON PO Take 1,200 mg by mouth.     ELDERBERRY PO Take by mouth. Calcium and vit c in this supplement     Ferrous Sulfate (IRON PO) Take 18 mg by mouth.     fluticasone (FLONASE) 50 MCG/ACT nasal spray Use  2 sprays  to each Nostril  Daily (Patient taking differently: as needed. Use  2 sprays  to each Nostril  Daily) 48 mL 3   montelukast (SINGULAIR) 10 MG tablet Take  1 tablet  Daily for Allergies                                            /                TAKE 1 TABLET BY MOUTH (Patient taking differently: as needed. Take  1 tablet  Daily for Allergies                                            /                TAKE 1 TABLET  BY MOUTH) 90 tablet 3   omeprazole (PRILOSEC) 40 MG capsule Take  1 capsule  Daily to Prevent Heartburn & Indigestion                                 /                              TAKE                        BY                     MOUTH                        ONCE DAILY 90 capsule 3   OVER THE COUNTER MEDICATION Take 2 capsules by mouth daily. Online OTC NERVE REGEN.     phentermine (ADIPEX-P) 37.5 MG tablet Take  1/2 to 1 tablet  every Morning  for Dieting & Weight Loss (Patient not taking: Reported on 01/11/2022) 90 tablet 0   rosuvastatin (CRESTOR) 5 MG tablet TAKE 1 TAB DAILY FOR CHOLESTEROL. 90 tablet 1   topiramate (TOPAMAX) 50 MG tablet TAKE 1/2 TO 1 TABLET 2 X /DAY AT SUPPERTIME & BEDTIME FOR DIETING & WEIGHT LOSS (Patient not taking: Reported on 01/11/2022) 180 tablet 1   triamcinolone cream (KENALOG) 0.5 % APPLY TO AFFECTED AREA TWICE A DAY 75 g 3   Zinc 50 MG TABS Take by mouth.     No current facility-administered medications on file prior to visit.    Health Maintenance:   Immunization History  Administered Date(s) Administered   Influenza, High Dose Seasonal PF 04/10/2018   PFIZER(Purple Top)SARS-COV-2 Vaccination 07/06/2019, 07/27/2019   Pneumococcal Conjugate-13 04/21/2019   Pneumococcal Polysaccharide-23 04/20/2020   Tdap 08/27/2012  Health Maintenance  Topic Date Due   Medicare Annual Wellness (AWV)  Never done   Zoster Vaccines- Shingrix (1 of 2) Never done   COVID-19 Vaccine (3 - Pfizer risk series) 08/24/2019   MAMMOGRAM  02/14/2022   Fecal DNA (Cologuard)  07/20/2022   TETANUS/TDAP  08/28/2022   Pneumonia Vaccine 20+ Years old  Completed   DEXA SCAN  Completed   Hepatitis C Screening  Completed   HPV VACCINES  Aged Out   INFLUENZA VACCINE  Discontinued   COLONOSCOPY (Pts 45-80yr Insurance coverage will need to be confirmed)  Discontinued    Shingrix: ask insurance   Pap: Hysterectomy 2012, sees GYN every other year, last pelvic 02/01/2021 MGM:  02/14/21 DEXA: 01/04/21 osteopenia  -   Colonoscopy: 2011 Cologuard: 07/20/2019 neg  Last eye exam: dr. ?Marland Kitchen2022, no issues, [facility] Last dental exam: Dr. FWoodfin Ganja last visit 2022,  Patient Care Team: MUnk Pinto MD as PCP - General (Internal Medicine) NRozetta Nunnery MD (Inactive) as Consulting Physician (Otolaryngology) Nahser, PWonda Cheng MD as Consulting Physician (Cardiology) KInda Castle MD (Inactive) as Consulting Physician (Gastroenterology) RGus Height MD (Inactive) as Consulting Physician (Obstetrics and Gynecology)  Medical History:  Past Medical History:  Diagnosis Date   Anxiety and depression    Cholecystitis, acute with cholelithiasis 12/17/2013   Depression    Diverticulitis    DIVERTICULITIS 04/19/2009   Qualifier: Diagnosis of  By: KDeatra InaMD, RSandy Salaam   Hyperlipidemia    Hypertension    Irritable bowel syndrome    Allergies Allergies  Allergen Reactions   Atorvastatin Other (See Comments)    Leg cramps   Levofloxacin    Prednisone     SURGICAL HISTORY She  has a past surgical history that includes Tonsillectomy; Vaginal hysterectomy; Colon surgery (2012); Cholecystectomy (N/A, 12/17/2013); and Knee arthroscopy (Right, 2019). FAMILY HISTORY Her family history includes Brain cancer (age of onset: 641 in her sister; Cancer in her paternal aunt; Healthy in her son; Heart attack (age of onset: 585 in her brother; Heart attack (age of onset: 560 in her father; Heart disease in her brother, father, and mother; Hypertension in her mother; Irritable bowel syndrome in her sister; Osteoporosis in her mother; Throat cancer in her brother. SOCIAL HISTORY She  reports that she has never smoked. She has never been exposed to tobacco smoke. She has never used smokeless tobacco. She reports that she does not drink alcohol and does not use drugs.  MEDICARE WELLNESS OBJECTIVES: Physical activity:   Cardiac risk factors:   Depression/mood  screen:      08/30/2021    2:13 PM  Depression screen PHQ 2/9  Decreased Interest 0  Down, Depressed, Hopeless 0  PHQ - 2 Score 0    ADLs:     08/30/2021    2:08 PM  In your present state of health, do you have any difficulty performing the following activities:  Hearing? 0  Vision? 0  Difficulty concentrating or making decisions? 0  Walking or climbing stairs? 0  Dressing or bathing? 0  Doing errands, shopping? 0     Cognitive Testing  Alert? Yes  Normal Appearance?Yes  Oriented to person? Yes  Place? Yes   Time? Yes  Recall of three objects?  Yes  Can perform simple calculations? Yes  Displays appropriate judgment?Yes  Can read the correct time from a watch face?Yes  EOL planning:       Review of Systems: Review of Systems  Constitutional:  Negative for chills, fever and malaise/fatigue.  HENT:  Negative for congestion, ear pain, hearing loss, sinus pain, sore throat and tinnitus.   Eyes: Negative.  Negative for blurred vision and double vision.  Respiratory:  Negative for cough, hemoptysis, sputum production, shortness of breath and wheezing.   Cardiovascular:  Negative for chest pain, palpitations and leg swelling.  Gastrointestinal:  Positive for constipation (ibs) and diarrhea (ibs). Negative for abdominal pain, blood in stool, heartburn, melena, nausea and vomiting.  Genitourinary: Negative.  Negative for dysuria and urgency.  Musculoskeletal:  Negative for back pain, falls, joint pain, myalgias and neck pain.  Skin: Negative.  Negative for rash.  Neurological:  Negative for dizziness, tingling, tremors, sensory change, loss of consciousness, weakness and headaches.  Endo/Heme/Allergies:  Does not bruise/bleed easily.  Psychiatric/Behavioral:  Positive for depression. Negative for substance abuse and suicidal ideas. The patient has insomnia. The patient is not nervous/anxious.     Physical Exam: Estimated body mass index is 31.09 kg/m as calculated from the  following:   Height as of 01/11/22: _0  (1.676 m).   Weight as of 01/11/22: 192 lb 9.6 oz (87.4 kg). There were no vitals taken for this visit.  General Appearance: Well nourished well developed, in no apparent distress.  Eyes: PERRLA, EOMs, conjunctiva no swelling or erythema ENT/Mouth: Ear canals normal without obstruction, swelling, erythema, or discharge.  TMs normal bilaterally with no erythema, bulging, retraction, or loss of landmark.  Oropharynx moist and clear with no exudate, erythema, or swelling.   Neck: Supple, thyroid normal. No bruits.  No cervical adenopathy Respiratory: Respiratory effort normal, Breath sounds clear A&P without wheeze, rhonchi, rales.   Cardio: RRR without murmurs, rubs or gallops. Brisk peripheral pulses without edema.  Chest: symmetric, with normal excursions Abdomen: Soft, nontender, no guarding, rebound, hernias, masses, or organomegaly.  Lymphatics: Non tender without lymphadenopathy.  Musculoskeletal: Full ROM all peripheral extremities,5/5 strength, and normal gait.  Skin: Warm, dry without rashes, lesions, ecchymosis. Neuro: Awake and oriented X 3, Cranial nerves intact, reflexes equal bilaterally. Normal muscle tone, no cerebellar symptoms. Sensation intact.  Psych:  normal affect, Insight and Judgment appropriate.    Medicare Attestation I have personally reviewed: The patient's medical and social history Their use of alcohol, tobacco or illicit drugs Their current medications and supplements The patient's functional ability including ADLs,fall risks, home safety risks, cognitive, and hearing and visual impairment Diet and physical activities Evidence for depression or mood disorders  The patient's weight, height, BMI, and visual acuity have been recorded in the chart.  I have made referrals, counseling, and provided education to the patient based on review of the above and I have provided the patient with a written personalized care plan for  preventive services.     Over 40 minutes of exam, counseling, chart review and critical decision making was performed  Tahisha Hakim Mikki [place]ee, NP-C 12:45 PM [facility]

## 2022-01-16 ENCOUNTER — Ambulatory Visit (INDEPENDENT_AMBULATORY_CARE_PROVIDER_SITE_OTHER): Payer: Medicare HMO | Admitting: Nurse Practitioner

## 2022-01-16 ENCOUNTER — Ambulatory Visit: Payer: Medicare HMO | Admitting: Diagnostic Neuroimaging

## 2022-01-16 ENCOUNTER — Encounter: Payer: Self-pay | Admitting: Nurse Practitioner

## 2022-01-16 VITALS — BP 138/82 | HR 96 | Temp 97.3°F | Ht 66.0 in | Wt 191.0 lb

## 2022-01-16 DIAGNOSIS — G629 Polyneuropathy, unspecified: Secondary | ICD-10-CM

## 2022-01-16 DIAGNOSIS — R030 Elevated blood-pressure reading, without diagnosis of hypertension: Secondary | ICD-10-CM | POA: Diagnosis not present

## 2022-01-16 DIAGNOSIS — J301 Allergic rhinitis due to pollen: Secondary | ICD-10-CM | POA: Diagnosis not present

## 2022-01-16 DIAGNOSIS — Z79899 Other long term (current) drug therapy: Secondary | ICD-10-CM | POA: Diagnosis not present

## 2022-01-16 DIAGNOSIS — Z1211 Encounter for screening for malignant neoplasm of colon: Secondary | ICD-10-CM

## 2022-01-16 DIAGNOSIS — R69 Illness, unspecified: Secondary | ICD-10-CM | POA: Diagnosis not present

## 2022-01-16 DIAGNOSIS — K589 Irritable bowel syndrome without diarrhea: Secondary | ICD-10-CM | POA: Diagnosis not present

## 2022-01-16 DIAGNOSIS — M8580 Other specified disorders of bone density and structure, unspecified site: Secondary | ICD-10-CM

## 2022-01-16 DIAGNOSIS — E66811 Obesity, class 1: Secondary | ICD-10-CM

## 2022-01-16 DIAGNOSIS — E669 Obesity, unspecified: Secondary | ICD-10-CM

## 2022-01-16 DIAGNOSIS — F419 Anxiety disorder, unspecified: Secondary | ICD-10-CM

## 2022-01-16 DIAGNOSIS — R7309 Other abnormal glucose: Secondary | ICD-10-CM | POA: Diagnosis not present

## 2022-01-16 DIAGNOSIS — E559 Vitamin D deficiency, unspecified: Secondary | ICD-10-CM

## 2022-01-16 DIAGNOSIS — K219 Gastro-esophageal reflux disease without esophagitis: Secondary | ICD-10-CM

## 2022-01-16 DIAGNOSIS — E782 Mixed hyperlipidemia: Secondary | ICD-10-CM

## 2022-01-16 DIAGNOSIS — F3341 Major depressive disorder, recurrent, in partial remission: Secondary | ICD-10-CM

## 2022-01-16 LAB — COMPLETE METABOLIC PANEL WITH GFR
AG Ratio: 1.7 (calc) (ref 1.0–2.5)
ALT: 6 U/L (ref 6–29)
AST: 16 U/L (ref 10–35)
Albumin: 4.3 g/dL (ref 3.6–5.1)
Alkaline phosphatase (APISO): 113 U/L (ref 37–153)
BUN: 11 mg/dL (ref 7–25)
CO2: 30 mmol/L (ref 20–32)
Calcium: 9.8 mg/dL (ref 8.6–10.4)
Chloride: 104 mmol/L (ref 98–110)
Creat: 0.8 mg/dL (ref 0.50–1.05)
Globulin: 2.6 g/dL (calc) (ref 1.9–3.7)
Glucose, Bld: 85 mg/dL (ref 65–99)
Potassium: 4.5 mmol/L (ref 3.5–5.3)
Sodium: 142 mmol/L (ref 135–146)
Total Bilirubin: 0.7 mg/dL (ref 0.2–1.2)
Total Protein: 6.9 g/dL (ref 6.1–8.1)
eGFR: 80 mL/min/{1.73_m2} (ref >=60)

## 2022-01-16 LAB — CBC WITH DIFFERENTIAL/PLATELET
Absolute Monocytes: 405 cells/uL (ref 200–950)
Basophils Absolute: 32 cells/uL (ref 0–200)
Basophils Relative: 0.6 %
Eosinophils Absolute: 119 cells/uL (ref 15–500)
Eosinophils Relative: 2.2 %
HCT: 40.9 % (ref 35.0–45.0)
Hemoglobin: 13.9 g/dL (ref 11.7–15.5)
Lymphs Abs: 907 cells/uL (ref 850–3900)
MCH: 30.2 pg (ref 27.0–33.0)
MCHC: 34 g/dL (ref 32.0–36.0)
MCV: 88.9 fL (ref 80.0–100.0)
MPV: 11.5 fL (ref 7.5–12.5)
Monocytes Relative: 7.5 %
Neutro Abs: 3937 cells/uL (ref 1500–7800)
Neutrophils Relative %: 72.9 %
Platelets: 208 10*3/uL (ref 140–400)
RBC: 4.6 10*6/uL (ref 3.80–5.10)
RDW: 12.1 % (ref 11.0–15.0)
Total Lymphocyte: 16.8 %
WBC: 5.4 10*3/uL (ref 3.8–10.8)

## 2022-01-16 LAB — LIPID PANEL
Cholesterol: 271 mg/dL — ABNORMAL HIGH (ref <200)
HDL: 39 mg/dL — ABNORMAL LOW (ref >=50)
LDL Cholesterol (Calc): 205 mg/dL (calc) — ABNORMAL HIGH
Non-HDL Cholesterol (Calc): 232 mg/dL (calc) — ABNORMAL HIGH (ref <130)
Total CHOL/HDL Ratio: 6.9 (calc) — ABNORMAL HIGH (ref <5.0)
Triglycerides: 125 mg/dL (ref <150)

## 2022-01-16 LAB — MAGNESIUM: Magnesium: 2 mg/dL (ref 1.5–2.5)

## 2022-01-16 MED ORDER — BUSPIRONE HCL 5 MG PO TABS: 5.0000 mg | ORAL_TABLET | Freq: Two times a day (BID) | ORAL | 3 refills | Status: DC

## 2022-01-16 NOTE — Patient Instructions (Signed)
Buspirone Tablets What is this medication? BUSPIRONE (byoo SPYE rone) treats anxiety. It works by balancing the levels of dopamine and serotonin in your brain, substances that help regulate mood. This medicine may be used for other purposes; ask your health care provider or pharmacist if you have questions. COMMON BRAND NAME(S): BuSpar, Buspar Dividose What should I tell my care team before I take this medication? They need to know if you have any of these conditions: Kidney or liver disease An unusual or allergic reaction to buspirone, other medications, foods, dyes, or preservatives Pregnant or trying to get pregnant Breast-feeding How should I use this medication? Take this medication by mouth with a glass of water. Follow the directions on the prescription label. You may take this medication with or without food. To ensure that this medication always works the same way for you, you should take it either always with or always without food. Take your doses at regular intervals. Do not take your medication more often than directed. Do not stop taking except on the advice of your care team. Talk to your care team about the use of this medication in children. Special care may be needed. Overdosage: If you think you have taken too much of this medicine contact a poison control center or emergency room at once. NOTE: This medicine is only for you. Do not share this medicine with others. What if I miss a dose? If you miss a dose, take it as soon as you can. If it is almost time for your next dose, take only that dose. Do not take double or extra doses. What may interact with this medication? Do not take this medication with any of the following: Linezolid MAOIs like Carbex, Eldepryl, Marplan, Nardil, and Parnate Methylene blue Procarbazine This medication may also interact with the following: Diazepam Digoxin Diltiazem Erythromycin Grapefruit juice Haloperidol Medications for mental  depression or mood problems Medications for seizures like carbamazepine, phenobarbital and phenytoin Nefazodone Other medications for anxiety Rifampin Ritonavir Some antifungal medications like itraconazole, ketoconazole, and voriconazole Verapamil Warfarin This list may not describe all possible interactions. Give your health care provider a list of all the medicines, herbs, non-prescription drugs, or dietary supplements you use. Also tell them if you smoke, drink alcohol, or use illegal drugs. Some items may interact with your medicine. What should I watch for while using this medication? Visit your care team for regular checks on your progress. It may take 1 to 2 weeks before your anxiety gets better. This medication may affect your coordination, reaction time, or judgment. Do not drive or operate machinery until you know how this medication affects you. Sit up or stand slowly to reduce the risk of dizzy or fainting spells. Drinking alcohol with this medication can increase the risk of these side effects. What side effects may I notice from receiving this medication? Side effects that you should report to your care team as soon as possible: Allergic reactions--skin rash, itching, hives, swelling of the face, lips, tongue, or throat Irritability, confusion, fast or irregular heartbeat, muscle stiffness, twitching muscles, sweating, high fever, seizure, chills, vomiting, diarrhea, which may be signs of serotonin syndrome Side effects that usually do not require medical attention (report to your care team if they continue or are bothersome): Anxiety, nervousness Dizziness Drowsiness Headache Nausea Trouble sleeping This list may not describe all possible side effects. Call your doctor for medical advice about side effects. You may report side effects to FDA at 1-800-FDA-1088. Where should I keep  my medication? Keep out of the reach of children. Store at room temperature below 30 degrees C  (86 degrees F). Protect from light. Keep container tightly closed. Throw away any unused medication after the expiration date. NOTE: This sheet is a summary. It may not cover all possible information. If you have questions about this medicine, talk to your doctor, pharmacist, or health care provider.  2023 Elsevier/Gold Standard (2007-04-11 00:00:00)

## 2022-01-18 ENCOUNTER — Encounter: Payer: Self-pay | Admitting: Neurology

## 2022-01-18 LAB — MULTIPLE MYELOMA PANEL, SERUM
Albumin SerPl Elph-Mcnc: 3.8 g/dL (ref 2.9–4.4)
Albumin/Glob SerPl: 1.4 (ref 0.7–1.7)
Alpha 1: 0.3 g/dL (ref 0.0–0.4)
Alpha2 Glob SerPl Elph-Mcnc: 0.8 g/dL (ref 0.4–1.0)
B-Globulin SerPl Elph-Mcnc: 1 g/dL (ref 0.7–1.3)
Gamma Glob SerPl Elph-Mcnc: 0.8 g/dL (ref 0.4–1.8)
Globulin, Total: 2.9 g/dL (ref 2.2–3.9)
IgA/Immunoglobulin A, Serum: 140 mg/dL (ref 87–352)
IgG (Immunoglobin G), Serum: 849 mg/dL (ref 586–1602)
IgM (Immunoglobulin M), Srm: 80 mg/dL (ref 26–217)
Total Protein: 6.7 g/dL (ref 6.0–8.5)

## 2022-01-18 LAB — ANCA PROFILE
Anti-MPO Antibodies: <0.2 units (ref 0.0–0.9)
Anti-PR3 Antibodies: <0.2 units (ref 0.0–0.9)
Atypical pANCA: <1:20 {titer}
C-ANCA: <1:20 {titer}
P-ANCA: <1:20 {titer}

## 2022-01-18 LAB — ANGIOTENSIN CONVERTING ENZYME: Angio Convert Enzyme: 33 U/L (ref 14–82)

## 2022-01-30 NOTE — Progress Notes (Unsigned)
Assessment and Plan:  There are no diagnoses linked to this encounter.    Further disposition pending results of labs. Discussed med's effects and SE's.   Over 30 minutes of exam, counseling, chart review, and critical decision making was performed.   Future Appointments  Date Time Provider [place]  01/31/2022 11:30 AM Alycia Rossetti, NP [facility] None  06/03/2022  3:00 PM Darrol Jump, NP [facility] None  09/02/2022  3:00 PM Darrol Jump, NP [facility] None  10/30/2022  1:00 PM Deveshwar, Abel Presto, MD [facility] None    ------------------------------------------------------------------------------------------------------------------   HPI There were no vitals taken for this visit. 71 y.o.female presents for  Past Medical History:  Diagnosis Date   Anxiety and depression    Cholecystitis, acute with cholelithiasis 12/17/2013   Depression    Diverticulitis    DIVERTICULITIS 04/19/2009   Qualifier: Diagnosis of  By: Deatra Ina MD, Sandy Salaam    Hyperlipidemia    Hypertension    Irritable bowel syndrome      Allergies  Allergen Reactions   Atorvastatin Other (See Comments)    Leg cramps   Levofloxacin    Prednisone     Current Outpatient Medications on File Prior to Visit  Medication Sig   ALPRAZolam (XANAX) 0.25 MG tablet Take 1 tablet (0.25 mg total) by mouth 3 (three) times daily as needed for anxiety. Limit to 5 days/week to avoid addiction (Patient not taking: Reported on 01/16/2022)   amitriptyline (ELAVIL) 25 MG tablet Take 1 tablet (25 mg total) by mouth at bedtime.   aspirin 81 MG tablet Take 81 mg by mouth daily.    busPIRone (BUSPAR) 5 MG tablet Take 1 tablet (5 mg total) by mouth 2 (two) times daily.   Cholecalciferol (VITAMIN D3) 125 MCG (5000 UT) CAPS Takes 2 capsules (10,000 units )  Daily   CINNAMON PO Take 1,200 mg by mouth.   ELDERBERRY PO Take by mouth. Calcium and vit c in this supplement   Ferrous Sulfate (IRON PO) Take 18 mg by mouth.    fluticasone (FLONASE) 50 MCG/ACT nasal spray Use  2 sprays  to each Nostril  Daily (Patient taking differently: as needed. Use  2 sprays  to each Nostril  Daily)   Ibuprofen-diphenhydrAMINE Cit (ADVIL PM PO) Take by mouth.   montelukast (SINGULAIR) 10 MG tablet Take  1 tablet  Daily for Allergies                                            /                TAKE 1 TABLET BY MOUTH (Patient taking differently: as needed. Take  1 tablet  Daily for Allergies                                            /                TAKE 1 TABLET BY MOUTH)   omeprazole (PRILOSEC) 40 MG capsule Take  1 capsule  Daily to Prevent Heartburn & Indigestion                                 /  TAKE                        BY                     MOUTH                        ONCE DAILY   OVER THE COUNTER MEDICATION Take 2 capsules by mouth daily. Online OTC NERVE REGEN.   rosuvastatin (CRESTOR) 5 MG tablet TAKE 1 TAB DAILY FOR CHOLESTEROL.   Zinc 50 MG TABS Take by mouth.   No current facility-administered medications on file prior to visit.    ROS: all negative except above.   Physical Exam:  There were no vitals taken for this visit.  General Appearance: Well nourished, in no apparent distress. Eyes: PERRLA, EOMs, conjunctiva no swelling or erythema Sinuses: No Frontal/maxillary tenderness ENT/Mouth: Ext aud canals clear, TMs without erythema, bulging. No erythema, swelling, or exudate on post pharynx.  Tonsils not swollen or erythematous. Hearing normal.  Neck: Supple, thyroid normal.  Respiratory: Respiratory effort normal, BS equal bilaterally without rales, rhonchi, wheezing or stridor.  Cardio: RRR with no MRGs. Brisk peripheral pulses without edema.  Abdomen: Soft, + BS.  Non tender, no guarding, rebound, hernias, masses. Lymphatics: Non tender without lymphadenopathy.  Musculoskeletal: Full ROM, 5/5 strength, normal gait.  Skin: Warm, dry without rashes, lesions, ecchymosis.  Neuro:  Cranial nerves intact. Normal muscle tone, no cerebellar symptoms. Sensation intact.  Psych: Awake and oriented X 3, normal affect, Insight and Judgment appropriate.     Alycia Rossetti, NP 9:27 AM [facility]

## 2022-01-31 ENCOUNTER — Encounter: Payer: Self-pay | Admitting: Nurse Practitioner

## 2022-01-31 ENCOUNTER — Ambulatory Visit (INDEPENDENT_AMBULATORY_CARE_PROVIDER_SITE_OTHER): Payer: Medicare HMO | Admitting: Nurse Practitioner

## 2022-01-31 ENCOUNTER — Other Ambulatory Visit: Payer: Self-pay

## 2022-01-31 VITALS — BP 124/72 | HR 105 | Temp 97.5°F | Ht 66.0 in | Wt 193.0 lb

## 2022-01-31 DIAGNOSIS — D225 Melanocytic nevi of trunk: Secondary | ICD-10-CM | POA: Diagnosis not present

## 2022-01-31 DIAGNOSIS — L918 Other hypertrophic disorders of the skin: Secondary | ICD-10-CM | POA: Diagnosis not present

## 2022-01-31 DIAGNOSIS — H2513 Age-related nuclear cataract, bilateral: Secondary | ICD-10-CM | POA: Diagnosis not present

## 2022-01-31 DIAGNOSIS — R030 Elevated blood-pressure reading, without diagnosis of hypertension: Secondary | ICD-10-CM | POA: Diagnosis not present

## 2022-01-31 DIAGNOSIS — L989 Disorder of the skin and subcutaneous tissue, unspecified: Secondary | ICD-10-CM

## 2022-01-31 NOTE — Patient Instructions (Signed)
Biopsy Wound Care Instructions  A skin biopsy was performed today with the purpose of obtaining tissue for the pathologist to examine under a microscope in order to determine the type of growth or rash you have.  Your wound will heal slowly over the next several weeks.  In order to achieve the best cosmetic results and minimize complications, please follow these instructions: Keep the bandage in place for 24 hours without getting it wet. After the first day, wash the area gently with soap and water (antibacterial soaps such as Dial, Safeguard, or Lever 2000 are good choices). Apply a topical antibiotic ointment such as Polysporin or Bacitracin or as prescribed by your physician.  Wounds heal best when they are moist. Cover with a bandage once daily until healed. Take 2 Extra Strength Tylenol every 6 hours as needed for pain. For any bleeding, hold direct pressure for 15 minutes without stopping.  For persistent bleeding, notify your physician immediately. Call the office if excessive redness, drainage, or pain occurs.  We will attempt to call you within 10 days with your biopsy report.  If you have not heard from [place] within 10 days, please call our office.  Skin Tag, Adult A skin tag (acrochordon) is a soft, extra growth of skin. Most skin tags are skin-colored and rarely bigger than a pencil eraser. They often form in areas where there is frequent rubbing, or friction, on the skin. This may be where there are folds in the skin, such as: The eyelids. The neck. The armpits. The groin. Skin tags are not dangerous, and they do not spread from person to person (are not contagious). You may have one skin tag or many. Skin tags do not need treatment. However, your health care provider may recommend removing a skin tag if it: Gets irritated from clothing or jewelry. Bleeds. Is visible and unsightly. What are the causes? This condition is linked to: Increasing  age. Pregnancy. Diabetes. Obesity. What are the signs or symptoms? Skin tags usually do not cause symptoms unless they get irritated by items touching your skin, such as clothing or jewelry. When this happens, you may have pain, itching, or bleeding. How is this diagnosed? This condition is diagnosed with an evaluation from your health care provider. No testing is needed for diagnosis. How is this treated? Treatment for this condition depends on whether you have symptoms. Your health care provider may also remove your skin tag if it is visible or if you do not like the way it looks. A skin tag can be removed by a health care provider with: A simple surgical procedure using scissors. A procedure that involves freezing your skin tag with a gas in liquid form (liquid nitrogen). A procedure that uses heat to destroy your skin tag (electrodessication). Follow these instructions at home: Watch for any changes in your skin tag. A normal skin tag does not require any other special care at home. Take over-the-counter and prescription medicines only as told by your health care provider. Keep all follow-up visits. Contact a health care provider if: You have a skin tag that: Becomes painful. Changes color. Bleeds. Swells. Summary Skin tags are soft, extra growths of skin found in areas of frequent rubbing or friction. Skin tags usually do not cause symptoms. If symptoms occur, you may have pain, itching, or bleeding. Your health care provider may remove your skin tag if it causes symptoms or if you do not like the way it looks. This information is not intended to  replace advice given to you by your health care provider. Make sure you discuss any questions you have with your health care provider. Document Revised: 04/04/2021 Document Reviewed: 04/04/2021 Elsevier Patient Education  [place].

## 2022-02-21 ENCOUNTER — Other Ambulatory Visit: Payer: Self-pay | Admitting: Nurse Practitioner

## 2022-02-21 DIAGNOSIS — E782 Mixed hyperlipidemia: Secondary | ICD-10-CM

## 2022-03-05 ENCOUNTER — Encounter: Payer: Self-pay | Admitting: Nurse Practitioner

## 2022-03-05 ENCOUNTER — Other Ambulatory Visit: Payer: Self-pay | Admitting: Nurse Practitioner

## 2022-03-05 DIAGNOSIS — G4700 Insomnia, unspecified: Secondary | ICD-10-CM

## 2022-03-05 MED ORDER — HYDROXYZINE HCL 25 MG PO TABS: ORAL_TABLET | ORAL | 0 refills | Status: DC

## 2022-03-13 DIAGNOSIS — H35372 Puckering of macula, left eye: Secondary | ICD-10-CM | POA: Diagnosis not present

## 2022-03-13 DIAGNOSIS — H25812 Combined forms of age-related cataract, left eye: Secondary | ICD-10-CM | POA: Diagnosis not present

## 2022-03-14 DIAGNOSIS — H35372 Puckering of macula, left eye: Secondary | ICD-10-CM | POA: Diagnosis not present

## 2022-03-14 DIAGNOSIS — H2513 Age-related nuclear cataract, bilateral: Secondary | ICD-10-CM | POA: Diagnosis not present

## 2022-03-19 DIAGNOSIS — H2513 Age-related nuclear cataract, bilateral: Secondary | ICD-10-CM | POA: Diagnosis not present

## 2022-03-19 DIAGNOSIS — H25812 Combined forms of age-related cataract, left eye: Secondary | ICD-10-CM | POA: Diagnosis not present

## 2022-03-29 DIAGNOSIS — H2511 Age-related nuclear cataract, right eye: Secondary | ICD-10-CM | POA: Diagnosis not present

## 2022-04-02 DIAGNOSIS — H2513 Age-related nuclear cataract, bilateral: Secondary | ICD-10-CM | POA: Diagnosis not present

## 2022-04-02 DIAGNOSIS — H25811 Combined forms of age-related cataract, right eye: Secondary | ICD-10-CM | POA: Diagnosis not present

## 2022-04-10 ENCOUNTER — Other Ambulatory Visit: Payer: Self-pay | Admitting: Internal Medicine

## 2022-04-24 ENCOUNTER — Encounter: Payer: Medicare HMO | Admitting: Nurse Practitioner

## 2022-05-10 ENCOUNTER — Encounter: Payer: Self-pay | Admitting: Nurse Practitioner

## 2022-05-10 ENCOUNTER — Other Ambulatory Visit: Payer: Self-pay

## 2022-05-10 ENCOUNTER — Ambulatory Visit (INDEPENDENT_AMBULATORY_CARE_PROVIDER_SITE_OTHER): Payer: Medicare HMO | Admitting: Nurse Practitioner

## 2022-05-10 VITALS — BP 110/74 | HR 71 | Temp 97.9°F | Ht 66.0 in | Wt 190.0 lb

## 2022-05-10 DIAGNOSIS — R062 Wheezing: Secondary | ICD-10-CM | POA: Diagnosis not present

## 2022-05-10 DIAGNOSIS — Z1152 Encounter for screening for COVID-19: Secondary | ICD-10-CM | POA: Diagnosis not present

## 2022-05-10 DIAGNOSIS — R051 Acute cough: Secondary | ICD-10-CM | POA: Diagnosis not present

## 2022-05-10 DIAGNOSIS — R6889 Other general symptoms and signs: Secondary | ICD-10-CM

## 2022-05-10 LAB — POCT INFLUENZA A/B
Influenza A, POC: NEGATIVE
Influenza B, POC: NEGATIVE

## 2022-05-10 LAB — POC COVID19 BINAXNOW: SARS Coronavirus 2 Ag: NEGATIVE

## 2022-05-10 MED ORDER — BENZONATATE 100 MG PO CAPS: 100.0000 mg | ORAL_CAPSULE | Freq: Three times a day (TID) | ORAL | 1 refills | Status: DC | PRN

## 2022-05-10 MED ORDER — PROMETHAZINE-DM 6.25-15 MG/5ML PO SYRP: 5.0000 mL | ORAL_SOLUTION | Freq: Four times a day (QID) | ORAL | 0 refills | Status: DC | PRN

## 2022-05-10 MED ORDER — ALBUTEROL SULFATE HFA 108 (90 BASE) MCG/ACT IN AERS: 2.0000 | INHALATION_SPRAY | Freq: Four times a day (QID) | RESPIRATORY_TRACT | 0 refills | Status: DC | PRN

## 2022-05-10 MED ORDER — DEXAMETHASONE SODIUM PHOSPHATE 10 MG/ML IJ SOLN
10.0000 mg | Freq: Once | INTRAMUSCULAR | Status: AC
  Administered 2022-05-10: 10 mg via INTRAMUSCULAR

## 2022-05-10 NOTE — Patient Instructions (Signed)
Antibiotic Resistance Antibiotics are medicines that treat infections caused by bacteria. Antibiotic resistance means that the antibiotic that would normally treat the infection no longer works. When this happens: The bacteria may grow. The resistant bacteria may cause more serious infections. The resistant bacteria become very hard to treat. What are the causes? This condition happens when bacteria come into contact with an antibiotic over and over again. Over time, the bacteria become resistant to the antibiotic. This can happen when antibiotics are: Given for a bacterial illness that does not need antibiotics. Given incorrectly for a viral illness, like a cold or the flu (influenza). Used in the wrong way. What increases the risk? This condition is more likely to develop in people who: Are often given antibiotics for viral infections. Do not take their antibiotics exactly as told. Take medicines that weaken their body's defense system (immune system). Are older adults. Need to take antibiotics because they: Have a long-term (chronic) medical condition. Need to lessen the chance of infection while having certain medical treatments. Treatments may include chemotherapy, dialysis, surgery, or an organ transplant. Other risk factors: Having a type of infection that is more likely to be caused by resistant bacteria. These may include certain: Skin infections. STIs (sexually transmitted infections). Infections of the stomach or intestines. Respiratory infections. Meningitis. Eating foods from animals that were treated with antibiotics. Antibiotic-resistant bacteria can be passed through the food. Living with, or caring for, a person with an antibiotic-resistant infection. Being in the hospital for a long time or living in a long-term care facility. What are the signs or symptoms? The main symptom is having an infection that does not get better with normal treatment. The specific signs and  symptoms depend on the type of infection. They may include: A fever. Nausea. Vomiting. Stomach pain. A wound or incision that: Is warm, red, and tender. Has brown, yellow, or green fluid coming from it. Has a bad smell coming from it. How is this diagnosed? This condition may be diagnosed based on your symptoms and medical history. Your health care provider may think you have antibiotic resistance if your infection does not get better after treatment. You may also have other tests, including: Fluid or stool (feces) analysis. This checks for bacteria under a microscope. It also finds what type of antibiotic will treat the infection or illness (culture and sensitivity test). Blood and imaging tests. These are done to check if your infection has spread or become more serious. How is this treated? Treatment depends on the specific infection. Treatment may include: Taking broad-spectrum antibiotics. These kill more types of bacteria. Staying in the hospital. Severe cases may need: Surgery to remove infected or damaged tissue. Antibiotics or other medicines given through an IV. Follow these instructions at home: Take antibiotics correctly  Know when antibiotics are needed and when they are not. Do not ask for antibiotics if you have a viral illness. Antibiotics will not make these infections go away faster. Ear infections, sinus infections, bronchitis, and influenza are all caused by viruses. Do not take, or save, antibiotics that are left over from a past prescription. Ask your health care provider how to safely get rid of the extra medicine. Do not take antibiotics that are not yours. If you are prescribed antibiotics, take them as told by your health care provider. Do not stop using the antibiotic even if you start to feel better. If you have been taking your medicine for more than 10 days, ask your health care provider  if you should keep taking it. Prevent infection     If you have an  infection, avoid close contact with people. Avoid using personal things that are used by other people, such as towels, razors, or bedding. Disinfect doorknobs, bathrooms, and surfaces you put food on. Use products that have bleach in them. Wash your hands with soap and water for at least 20 seconds. Wash your hands: After using the bathroom. Before and after caring for a wound or incision. Before and after touching food. Stay up to date on vaccinations. Many vaccines prevent illnesses that are treated with antibiotics. Contact a health care provider if: You have a fever. You have chills. You are taking a new antibiotic and do not get better after a few days. You have diarrhea 3 or more times after you start a new antibiotic. You have any infection signs. You have new or worsening nausea, vomiting, or stomach pain. Get help right away if: You have a rash. Your mouth or tongue itches. You have a tight feeling in your throat. You have trouble breathing. You have chest pain or tightness. You are dizzy or faint. These symptoms may be an emergency. Get help right away. Call 911. Do not wait to see if the symptoms will go away. Do not drive yourself to the hospital. This information is not intended to replace advice given to you by your health care provider. Make sure you discuss any questions you have with your health care provider. Document Revised: 09/18/2021 Document Reviewed: 09/18/2021 Elsevier Patient Education  [place].   Upper Respiratory Infection, Adult An upper respiratory infection (URI) affects the nose, throat, and upper airways that lead to the lungs. The most common type of URI is often called the common cold. URIs usually get better on their own, without medical treatment. What are the causes? A URI is caused by a germ (virus). You may catch these germs by: Breathing in droplets from an infected person's cough or sneeze. Touching something that has the germ on  it (is contaminated) and then touching your mouth, nose, or eyes. What increases the risk? You are more likely to get a URI if: You are very young or very old. You have close contact with others, such as at work, school, or a health care facility. You smoke. You have long-term (chronic) heart or lung disease. You have a weakened disease-fighting system (immune system). You have nasal allergies or asthma. You have a lot of stress. You have poor nutrition. What are the signs or symptoms? Runny or stuffy (congested) nose. Cough. Sneezing. Sore throat. Headache. Feeling tired (fatigue). Fever. Not wanting to eat as much as usual. Pain in your forehead, behind your eyes, and over your cheekbones (sinus pain). Muscle aches. Redness or irritation of the eyes. Pressure in the ears or face. How is this treated? URIs usually get better on their own within 7-10 days. Medicines cannot cure URIs, but your doctor may recommend certain medicines to help relieve symptoms, such as: Over-the-counter cold medicines. Medicines to reduce coughing (cough suppressants). Coughing is a type of defense against infection that helps to clear the nose, throat, windpipe, and lungs (respiratory system). Take these medicines only as told by your doctor. Medicines to lower your fever. Follow these instructions at home: Activity Rest as needed. If you have a fever, stay home from work or school until your fever is gone, or until your doctor says you may return to work or school. You should stay home  until you cannot spread the infection anymore (you are not contagious). Your doctor may have you wear a face mask so you have less risk of spreading the infection. Relieving symptoms Rinse your mouth often with salt water. To make salt water, dissolve -1 tsp (3-6 g) of salt in 1 cup (237 mL) of warm water. Use a cool-mist humidifier to add moisture to the air. This can help you breathe more easily. Eating and  drinking  Drink enough fluid to keep your pee (urine) pale yellow. Eat soups and other clear broths. General instructions  Take over-the-counter and prescription medicines only as told by your doctor. Do not smoke or use any products that contain nicotine or tobacco. If you need help quitting, ask your doctor. Avoid being where people are smoking (avoid secondhand smoke). Stay up to date on all your shots (immunizations), and get the flu shot every year. Keep all follow-up visits. How to prevent the spread of infection to others  Wash your hands with soap and water for at least 20 seconds. If you cannot use soap and water, use hand sanitizer. Avoid touching your mouth, face, eyes, or nose. Cough or sneeze into a tissue or your sleeve or elbow. Do not cough or sneeze into your hand or into the air. Contact a doctor if: You are getting worse, not better. You have any of these: A fever or chills. Brown or red mucus in your nose. Yellow or brown fluid (discharge)coming from your nose. Pain in your face, especially when you bend forward. Swollen neck glands. Pain when you swallow. White areas in the back of your throat. Get help right away if: You have shortness of breath that gets worse. You have very bad or constant: Headache. Ear pain. Pain in your forehead, behind your eyes, and over your cheekbones (sinus pain). Chest pain. You have long-lasting (chronic) lung disease along with any of these: Making high-pitched whistling sounds when you breathe, most often when you breathe out (wheezing). Long-lasting cough (more than 14 days). Coughing up blood. A change in your usual mucus. You have a stiff neck. You have changes in your: Vision. Hearing. Thinking. Mood. These symptoms may be an emergency. Get help right away. Call 911. Do not wait to see if the symptoms will go away. Do not drive yourself to the hospital. Summary An upper respiratory infection (URI) is caused by a  germ (virus). The most common type of URI is often called the common cold. URIs usually get better within 7-10 days. Take over-the-counter and prescription medicines only as told by your doctor. This information is not intended to replace advice given to you by your health care provider. Make sure you discuss any questions you have with your health care provider. Document Revised: 09/20/2020 Document Reviewed: 09/20/2020 Elsevier Patient Education  [place].

## 2022-05-10 NOTE — Progress Notes (Signed)
Assessment and Plan:  Teresa Vasquez was seen today for an episodic visit.  Diagnoses and all order for this visit:  Flu-like symptoms Negative  - POCT Influenza A/B  Encounter for screening for COVID-19 Negative  - POC COVID-19  Acute cough Start Promethazine and Benzonatate as directed. Discussed use of inhaler as needed  Stay well hydrated  - promethazine-dextromethorphan (PROMETHAZINE-DM) 6.25-15 MG/5ML syrup; Take 5 mLs by mouth 4 (four) times daily as needed for cough.  Dispense: 240 mL; Refill: 0 - albuterol (VENTOLIN HFA) 108 (90 Base) MCG/ACT inhaler; Inhale 2 puffs into the lungs every 6 (six) hours as needed for wheezing or shortness of breath.  Dispense: 8 g; Refill: 0 - benzonatate (TESSALON PERLES) 100 MG capsule; Take 1 capsule (100 mg total) by mouth 3 (three) times daily as needed for cough.  Dispense: 30 capsule; Refill: 1  Wheezing Dexamethasone injection administered - tolerated well. Discussed use of Albuterol as directed as needed Report to ER or call 911 for any increase in difficulty breathing.  - dexamethasone (DECADRON) injection 10 mg - albuterol (VENTOLIN HFA) 108 (90 Base) MCG/ACT inhaler; Inhale 2 puffs into the lungs every 6 (six) hours as needed for wheezing or shortness of breath.  Dispense: 8 g; Refill: 0   Notify office for further evaluation and treatment, questions or concerns if s/s fail to improve. The risks and benefits of my recommendations, as well as other treatment options were discussed with the patient today. Questions were answered.  Further disposition pending results of labs. Discussed med's effects and SE's.    Over 15 minutes of exam, counseling, chart review, and critical decision making was performed.   Future Appointments  Date Time Provider [place]  06/03/2022  3:00 PM Darrol Jump, NP [facility] None  09/02/2022  3:00 PM Darrol Jump, NP [facility] None  10/30/2022  1:00 PM Deveshwar, Abel Presto, MD [facility]  None    ------------------------------------------------------------------------------------------------------------------   HPI BP 110/74   Pulse 71   Temp 97.9 F (36.6 C)   Ht '5\' 6"'$  (1.676 m)   Wt 190 lb (86.2 kg)   SpO2 98%   BMI 30.67 kg/m    Patient complains of symptoms of a URI, possible sinusitis. Symptoms include left ear pressure/pain, congestion, headache described as pressure, nasal congestion, non productive cough, and wheezing. Onset of symptoms was 3 days ago, and has been unchanged since that time. Treatment to date:  Tylenol and Motrin .  Denies fever, chills, N/V.  Past Medical History:  Diagnosis Date   Anxiety and depression    Cholecystitis, acute with cholelithiasis 12/17/2013   Depression    Diverticulitis    DIVERTICULITIS 04/19/2009   Qualifier: Diagnosis of  By: Deatra Ina MD, Sandy Salaam    Hyperlipidemia    Hypertension    Irritable bowel syndrome      Allergies  Allergen Reactions   Atorvastatin Other (See Comments)    Leg cramps   Levofloxacin    Prednisone     Current Outpatient Medications on File Prior to Visit  Medication Sig   aspirin 81 MG tablet Take 81 mg by mouth daily.    busPIRone (BUSPAR) 5 MG tablet Take 1 tablet (5 mg total) by mouth 2 (two) times daily.   Cholecalciferol (VITAMIN D3) 125 MCG (5000 UT) CAPS Takes 2 capsules (10,000 units )  Daily   CINNAMON PO Take 1,200 mg by mouth.   ELDERBERRY PO Take by mouth. Calcium and vit c in this supplement   Ferrous  Sulfate (IRON PO) Take 18 mg by mouth.   fluticasone (FLONASE) 50 MCG/ACT nasal spray Use  2 sprays  to each Nostril  Daily (Patient taking differently: as needed. Use  2 sprays  to each Nostril  Daily)   hydrOXYzine (ATARAX) 25 MG tablet 1-3 tabs at bedtime for sleep   Ibuprofen-diphenhydrAMINE Cit (ADVIL PM PO) Take by mouth.   montelukast (SINGULAIR) 10 MG tablet TAKE 1 TABLET BY MOUTH DAILY FOR ALLERGIES   omeprazole (PRILOSEC) 40 MG capsule Take  1 capsule  Daily to  Prevent Heartburn & Indigestion                                 /                              TAKE                        BY                     MOUTH                        ONCE DAILY   OVER THE COUNTER MEDICATION Take 2 capsules by mouth daily. Online OTC NERVE REGEN.   rosuvastatin (CRESTOR) 5 MG tablet TAKE 1 TAB DAILY FOR CHOLESTEROL.   Zinc 50 MG TABS Take by mouth.   No current facility-administered medications on file prior to visit.    ROS: all negative except what is noted in the HPI.   Physical Exam:  BP 110/74   Pulse 71   Temp 97.9 F (36.6 C)   Ht '5\' 6"'$  (1.676 m)   Wt 190 lb (86.2 kg)   SpO2 98%   BMI 30.67 kg/m   General Appearance: NAD.  Awake, conversant and cooperative. Eyes: PERRLA, EOMs intact.  Sclera white.  Conjunctiva without erythema. Sinuses: No frontal/maxillary tenderness.  No nasal discharge. Nares patent.  ENT/Mouth: Ext aud canals clear.  Bilateral TMs w/DOL and without erythema or bulging. Hearing intact.  Posterior pharynx without swelling or exudate.  Tonsils without swelling or erythema.  Neck: Supple.  No masses, nodules or thyromegaly. Respiratory: Effort is regular with non-labored breathing. Breath sounds are equal bilaterally without rales, rhonchi, wheezing or stridor.  Cardio: RRR with no MRGs. Brisk peripheral pulses without edema.  Abdomen: Active BS in all four quadrants.  Soft and non-tender without guarding, rebound tenderness, hernias or masses. Lymphatics: Non tender without lymphadenopathy.  Musculoskeletal: Full ROM, 5/5 strength, normal ambulation.  No clubbing or cyanosis. Skin: Appropriate color for ethnicity. Warm without rashes, lesions, ecchymosis, ulcers.  Neuro: CN II-XII grossly normal. Normal muscle tone without cerebellar symptoms and intact sensation.   Psych: AO X 3,  appropriate mood and affect, insight and judgment.     Darrol Jump, NP 10:49 AM [facility]

## 2022-06-03 ENCOUNTER — Encounter: Payer: Medicare HMO | Admitting: Nurse Practitioner

## 2022-06-09 ENCOUNTER — Other Ambulatory Visit: Payer: Self-pay | Admitting: Nurse Practitioner

## 2022-06-09 DIAGNOSIS — R051 Acute cough: Secondary | ICD-10-CM

## 2022-06-09 DIAGNOSIS — F419 Anxiety disorder, unspecified: Secondary | ICD-10-CM

## 2022-06-10 ENCOUNTER — Encounter: Payer: Self-pay | Admitting: Nurse Practitioner

## 2022-06-10 ENCOUNTER — Ambulatory Visit (INDEPENDENT_AMBULATORY_CARE_PROVIDER_SITE_OTHER): Payer: Medicare HMO | Admitting: Nurse Practitioner

## 2022-06-10 VITALS — BP 138/70 | HR 81 | Temp 97.5°F | Ht 66.0 in | Wt 189.4 lb

## 2022-06-10 DIAGNOSIS — H6692 Otitis media, unspecified, left ear: Secondary | ICD-10-CM | POA: Diagnosis not present

## 2022-06-10 DIAGNOSIS — R030 Elevated blood-pressure reading, without diagnosis of hypertension: Secondary | ICD-10-CM | POA: Diagnosis not present

## 2022-06-10 DIAGNOSIS — E669 Obesity, unspecified: Secondary | ICD-10-CM | POA: Diagnosis not present

## 2022-06-10 MED ORDER — AMOXICILLIN 500 MG PO TABS: 500.0000 mg | ORAL_TABLET | Freq: Three times a day (TID) | ORAL | 0 refills | Status: AC

## 2022-06-10 NOTE — Progress Notes (Signed)
Assessment and Plan:  Teresa Vasquez was seen today for otalgia.  Diagnoses and all orders for this visit:  Elevated blood pressure reading without diagnosis of hypertension - Currently controlled without medication - continue DASH diet, exercise and monitor at home. Call if greater than 130/80.   Acute left otitis media Continue Mucinex , generic allegra or zyrtec, and flonase Start amoxicillin course If no improvement in the next 7 days notify the office -     amoxicillin (AMOXIL) 500 MG tablet; Take 1 tablet (500 mg total) by mouth 3 (three) times daily for 7 days. 10 days  Obesity (BMI 30.0-34.9) Continue Phentermine and Topamax Decrease saturated fats and simple carbs Increase exercise      Further disposition pending results of labs. Discussed med's effects and SE's.   Over 30 minutes of exam, counseling, chart review, and critical decision making was performed.   Future Appointments  Date Time Provider Department Center  06/19/2022  2:00 PM Adela Glimpse, NP [facility] None  09/02/2022  3:00 PM Adela Glimpse, NP [facility] None  10/30/2022  1:00 PM Deveshwar, Janalyn Rouse, MD [facility] None    ------------------------------------------------------------------------------------------------------------------   HPI BP 138/70   Pulse 81   Temp (!) 97.5 F (36.4 C)   Ht 5\' 6"  (1.676 m)   Wt 189 lb 6.4 oz (85.9 kg)   SpO2 97%   BMI 30.57 kg/m   72 y.o.female presents for pain in left ear that will hurt down into her jaw, describes as a sharp pain that comes and goes. She was seen 05/10/22 for cough/wheezing with dexamethasone 10 mg injection, tessalon perles, Promethazine DM cough syrup and albuterol inhaler. She states pain in left ear persists. Will hear a buzzing and have pain in her left ear. Also has noticed sinus congestion  BP is controlled without medication.  Denies chest pain, shortness of breath and dizziness BP Readings from Last 3 Encounters:  06/10/22 138/70  05/10/22  110/74  01/31/22 124/72   BMI is Body mass index is 30.57 kg/m., she has been working on diet and exercise. Has used Phentermine and Topamax in the past with good results and no side effects  Has been off for 4 months but plans to restart Wt Readings from Last 3 Encounters:  06/10/22 189 lb 6.4 oz (85.9 kg)  05/10/22 190 lb (86.2 kg)  01/31/22 193 lb (87.5 kg)     Past Medical History:  Diagnosis Date   Anxiety and depression    Cholecystitis, acute with cholelithiasis 12/17/2013   Depression    Diverticulitis    DIVERTICULITIS 04/19/2009   Qualifier: Diagnosis of  By: Arlyce Dice MD, Barbette Hair    Hyperlipidemia    Hypertension    Irritable bowel syndrome      Allergies  Allergen Reactions   Atorvastatin Other (See Comments)    Leg cramps   Levofloxacin    Prednisone     Current Outpatient Medications on File Prior to Visit  Medication Sig   albuterol (VENTOLIN HFA) 108 (90 Base) MCG/ACT inhaler TAKE 2 PUFFS BY MOUTH EVERY 6 HOURS AS NEEDED FOR WHEEZE OR SHORTNESS OF BREATH   aspirin 81 MG tablet Take 81 mg by mouth daily.    busPIRone (BUSPAR) 5 MG tablet TAKE 1 TABLET BY MOUTH TWICE A DAY   Cholecalciferol (VITAMIN D3) 125 MCG (5000 UT) CAPS Takes 2 capsules (10,000 units )  Daily   CINNAMON PO Take 1,200 mg by mouth.   ELDERBERRY PO Take by mouth. Calcium and vit c  in this supplement   Ferrous Sulfate (IRON PO) Take 18 mg by mouth.   fluticasone (FLONASE) 50 MCG/ACT nasal spray Use  2 sprays  to each Nostril  Daily (Patient taking differently: as needed. Use  2 sprays  to each Nostril  Daily)   Ibuprofen-diphenhydrAMINE Cit (ADVIL PM PO) Take by mouth.   montelukast (SINGULAIR) 10 MG tablet TAKE 1 TABLET BY MOUTH DAILY FOR ALLERGIES   omeprazole (PRILOSEC) 40 MG capsule Take  1 capsule  Daily to Prevent Heartburn & Indigestion                                 /                              TAKE                        BY                     MOUTH                        ONCE  DAILY   rosuvastatin (CRESTOR) 5 MG tablet TAKE 1 TAB DAILY FOR CHOLESTEROL.   Zinc 50 MG TABS Take by mouth.   benzonatate (TESSALON PERLES) 100 MG capsule Take 1 capsule (100 mg total) by mouth 3 (three) times daily as needed for cough. (Patient not taking: Reported on 06/10/2022)   hydrOXYzine (ATARAX) 25 MG tablet 1-3 tabs at bedtime for sleep (Patient not taking: Reported on 06/10/2022)   OVER THE COUNTER MEDICATION Take 2 capsules by mouth daily. Online OTC NERVE REGEN. (Patient not taking: Reported on 06/10/2022)   promethazine-dextromethorphan (PROMETHAZINE-DM) 6.25-15 MG/5ML syrup Take 5 mLs by mouth 4 (four) times daily as needed for cough. (Patient not taking: Reported on 06/10/2022)   No current facility-administered medications on file prior to visit.    ROS: all negative except above.   Physical Exam:  BP 138/70   Pulse 81   Temp (!) 97.5 F (36.4 C)   Ht 5\' 6"  (1.676 m)   Wt 189 lb 6.4 oz (85.9 kg)   SpO2 97%   BMI 30.57 kg/m   General Appearance: Well nourished, in no apparent distress. Eyes: PERRLA, EOMs, conjunctiva no swelling or erythema Sinuses: No Frontal/maxillary tenderness ENT/Mouth: Ext aud canals clear, R TM without erythema, bulging. L TM erythematous and bulging with some discharge. No erythema, swelling, or exudate on post pharynx.   Hearing normal.  Neck: Supple, thyroid normal.  Respiratory: Respiratory effort normal, BS equal bilaterally without rales, rhonchi, wheezing or stridor.  Cardio: RRR with no MRGs. Brisk peripheral pulses without edema.  Abdomen: Soft, + BS.  Non tender, no guarding, rebound, hernias, masses. Lymphatics: Positive submandibular adenopathy bilaterally Musculoskeletal: Full ROM, 5/5 strength, normal gait.  Skin: Warm, dry without rashes, lesions, ecchymosis.  Neuro: Cranial nerves intact. Normal muscle tone, no cerebellar symptoms. Sensation intact.  Psych: Awake and oriented X 3, normal affect, Insight and Judgment appropriate.      [name], NP 11:54 AM [name]

## 2022-06-10 NOTE — Patient Instructions (Signed)
Otitis Media, Adult  Otitis media is a condition in which the middle ear is red and swollen (inflamed) and full of fluid. The middle ear is the part of the ear that contains bones for hearing as well as air that helps send sounds to the brain. The condition usually goes away on its own. What are the causes? This condition is caused by a blockage in the eustachian tube. This tube connects the middle ear to the back of the nose. It normally allows air into the middle ear. The blockage is caused by fluid or swelling. Problems that can cause blockage include: A cold or infection that affects the nose, mouth, or throat. Allergies. An irritant, such as tobacco smoke. Adenoids that have become large. The adenoids are soft tissue located in the back of the throat, behind the nose and the roof of the mouth. Growth or swelling in the upper part of the throat, just behind the nose (nasopharynx). Damage to the ear caused by a change in pressure. This is called barotrauma. What increases the risk? You are more likely to develop this condition if you: Smoke or are exposed to tobacco smoke. Have an opening in the roof of your mouth (cleft palate). Have acid reflux. Have problems in your body's defense system (immune system). What are the signs or symptoms? Symptoms of this condition include: Ear pain. Fever. Problems with hearing. Being tired. Fluid leaking from the ear. Ringing in the ear. How is this treated? This condition can go away on its own within 3-5 days. But if the condition is caused by germs (bacteria) and does not go away on its own, or if it keeps coming back, your doctor may: Give you antibiotic medicines. Give you medicines for pain. Follow these instructions at home: Take over-the-counter and prescription medicines only as told by your doctor. If you were prescribed an antibiotic medicine, take it as told by your doctor. Do not stop taking it even if you start to feel better. Keep  all follow-up visits. Contact a doctor if: You have bleeding from your nose. There is a lump on your neck. You are not feeling better in 5 days. You feel worse instead of better. Get help right away if: You have pain that is not helped with medicine. You have swelling, redness, or pain around your ear. You get a stiff neck. You cannot move part of your face (paralysis). You notice that the bone behind your ear hurts when you touch it. You get a very bad headache. Summary Otitis media means that the middle ear is red, swollen, and full of fluid. This condition usually goes away on its own. If the problem does not go away, treatment may be needed. You may be given medicines to treat the infection or to treat your pain. If you were prescribed an antibiotic medicine, take it as told by your doctor. Do not stop taking it even if you start to feel better. Keep all follow-up visits. This information is not intended to replace advice given to you by your health care provider. Make sure you discuss any questions you have with your health care provider. Document Revised: 05/29/2020 Document Reviewed: 05/29/2020 Elsevier Patient Education  2023 [employer] Inc.  

## 2022-06-19 ENCOUNTER — Encounter: Payer: Self-pay | Admitting: Nurse Practitioner

## 2022-06-19 ENCOUNTER — Ambulatory Visit (INDEPENDENT_AMBULATORY_CARE_PROVIDER_SITE_OTHER): Payer: Medicare HMO | Admitting: Nurse Practitioner

## 2022-06-19 VITALS — BP 120/70 | HR 96 | Temp 97.9°F | Resp 16 | Ht 66.0 in | Wt 189.0 lb

## 2022-06-19 DIAGNOSIS — Z79899 Other long term (current) drug therapy: Secondary | ICD-10-CM

## 2022-06-19 DIAGNOSIS — M8580 Other specified disorders of bone density and structure, unspecified site: Secondary | ICD-10-CM

## 2022-06-19 DIAGNOSIS — E669 Obesity, unspecified: Secondary | ICD-10-CM

## 2022-06-19 DIAGNOSIS — E559 Vitamin D deficiency, unspecified: Secondary | ICD-10-CM | POA: Diagnosis not present

## 2022-06-19 DIAGNOSIS — F419 Anxiety disorder, unspecified: Secondary | ICD-10-CM

## 2022-06-19 DIAGNOSIS — Z1211 Encounter for screening for malignant neoplasm of colon: Secondary | ICD-10-CM

## 2022-06-19 DIAGNOSIS — R7309 Other abnormal glucose: Secondary | ICD-10-CM | POA: Diagnosis not present

## 2022-06-19 DIAGNOSIS — E782 Mixed hyperlipidemia: Secondary | ICD-10-CM | POA: Diagnosis not present

## 2022-06-19 DIAGNOSIS — Z1389 Encounter for screening for other disorder: Secondary | ICD-10-CM | POA: Diagnosis not present

## 2022-06-19 DIAGNOSIS — J301 Allergic rhinitis due to pollen: Secondary | ICD-10-CM

## 2022-06-19 DIAGNOSIS — Z136 Encounter for screening for cardiovascular disorders: Secondary | ICD-10-CM | POA: Diagnosis not present

## 2022-06-19 DIAGNOSIS — Z0000 Encounter for general adult medical examination without abnormal findings: Secondary | ICD-10-CM

## 2022-06-19 DIAGNOSIS — K589 Irritable bowel syndrome without diarrhea: Secondary | ICD-10-CM

## 2022-06-19 DIAGNOSIS — R030 Elevated blood-pressure reading, without diagnosis of hypertension: Secondary | ICD-10-CM | POA: Diagnosis not present

## 2022-06-19 DIAGNOSIS — Z0001 Encounter for general adult medical examination with abnormal findings: Secondary | ICD-10-CM | POA: Diagnosis not present

## 2022-06-19 DIAGNOSIS — F5102 Adjustment insomnia: Secondary | ICD-10-CM

## 2022-06-19 DIAGNOSIS — F3341 Major depressive disorder, recurrent, in partial remission: Secondary | ICD-10-CM

## 2022-06-19 DIAGNOSIS — E66812 Obesity, class 2: Secondary | ICD-10-CM

## 2022-06-19 NOTE — Progress Notes (Signed)
Complete Physical  Assessment and Plan:  Encounter for general adult medical examination with abnormal findings Due annually Health maintenance reviewed Healthy lifestyle goals set  Allergic rhinitis due to pollen, unspecified seasonality Continue OTC antihistamine Avoid triggers  Irritable bowel syndrome, unspecified type Controlled For any worsening symptoms, blood in stool, AB pain, etc call office  Elevated blood pressure reading without diagnosis of hypertension Controlled Discussed DASH (Dietary Approaches to Stop Hypertension) DASH diet is lower in sodium than a typical American diet. Cut back on foods that are high in saturated fat, cholesterol, and trans fats. Eat more whole-grain foods, fish, poultry, and nuts Remain active and exercise as tolerated daily.  Monitor BP at home-Call if greater than 130/80.  Check CMP/CBC  Other abnormal glucose Education: Reviewed 'ABCs' of diabetes management  Discussed goals to be met and/or maintained include A1C (<7) Blood pressure (<130/80) Cholesterol (LDL <70) Continue Eye Exam yearly  Continue Dental Exam Q6 mo Discussed dietary recommendations Discussed Physical Activity recommendations Check A1C  Mixed hyperlipidemia Continue Rosuvastatin Discussed lifestyle modifications. Recommended diet heavy in fruits and veggies, omega 3's. Decrease consumption of animal meats, cheeses, and dairy products. Remain active and exercise as tolerated. Continue to monitor. Check lipids/TSH  Vitamin D deficiency Continue supplement for goal of 60-100 Monitor Vitamin D levels  Recurrent depression in partial remission /Anxiety state/ insomnia  Increase Buspar to 20 mg PM Reviewed relaxation techniques.  Sleep hygiene. Recommended Cognitive Behavioral Therapy (CBT). Recommended mindfulness meditation and exercise.   Encouraged personality growth wand development through coping techniques and problem-solving  skills. Limit/Decrease/Monitor drug/alcohol intake.     Obesity- BMI 34 Discussed appropriate BMI Diet modification. Physical activity. Encouraged/praised to build confidence.  Medication management All medications discussed and reviewed in full. All questions and concerns regarding medications addressed.     Estrogen def/Osteopenia DEXA UTD Pursue a combination of weight-bearing exercises and strength training. Advised on fall prevention measures including proper lighting in all rooms, removal of area rugs and floor clutter, use of walking devices as deemed appropriate, avoidance of uneven walking surfaces. Smoking cessation and moderate alcohol consumption if applicable Consume 800 to 1000 IU of vitamin D daily with a goal vitamin D serum value of 30 ng/mL or higher. Aim for 1000 to 1200 mg of elemental calcium daily through supplements and/or dietary sources.  Screening for hematuria/proteinuria - Routine UA with reflex microscopic - Microablumin/creatinine urine ratio  Screening for cardiovascular condition - EKG  Screening for colon cancer -Cologuard  Orders Placed This Encounter  Procedures   CBC with Differential/Platelet   COMPLETE METABOLIC PANEL WITH GFR   Magnesium   Lipid panel   TSH   Hemoglobin A1c   Insulin, random   VITAMIN D 25 Hydroxy (Vit-D Deficiency, Fractures)   Urinalysis, Routine w reflex microscopic   Microalbumin / creatinine urine ratio   Cologuard   EKG 12-Lead   Notify office for further evaluation and treatment, questions or concerns if any reported s/s fail to improve.   The patient was advised to call back or seek an in-person evaluation if any symptoms worsen or if the condition fails to improve as anticipated.   Further disposition pending results of labs. Discussed med's effects and SE's.    I discussed the assessment and treatment plan with the patient. The patient was provided an opportunity to ask questions and all were  answered. The patient agreed with the plan and demonstrated an understanding of the instructions.  Discussed med's effects and SE's. Screening labs and  tests as requested with regular follow-up as recommended.  I provided 40 minutes of face-to-face time during this encounter including counseling, chart review, and critical decision making was preformed.  Today's Plan of Care is based on a patient-centered health care approach known as shared decision making - the decisions, tests and treatments allow for patient preferences and values to be balanced with clinical evidence.    Future Appointments  Date Time Provider Department Center  09/02/2022  3:00 PM Adela Glimpse, NP [facility] None  10/30/2022  1:00 PM Pollyann Savoy, MD [facility] None  06/19/2023  2:00 PM Karmel Patricelli, Archie Patten, NP [facility] None     HPI  72 y.o. female  presents for a complete physical. She has Anxiety; Major depressive disorder in partial remission; IRRITABLE BOWEL SYNDROME; Allergic rhinitis; Vitamin D deficiency; Elevated blood pressure reading without diagnosis of hypertension; Other abnormal glucose (prediabetes); Mixed hyperlipidemia; Obesity (BMI 30.0-34.9); GERD (gastroesophageal reflux disease); Plantar fasciitis of left foot; Insomnia due to psychological stress; Iron deficiency anemia; Osteopenia; and Positive ANA (antinuclear antibody) on their problem list.   She is married, she has 1 child and 2 grandkids, watches occasionally.   She is retired but still helps with polling in [employer]   Overall he reports feeling well today.    She is following with Dr. Tenny Craw for GYN every 2 years.  She has hx of IBS.  Currently well controlled.  Follows with GI and UTD on colonoscopy.    BMI is Body mass index is 30.51 kg/m., she has been working on diet and exercise.   Wt Readings from Last 3 Encounters:  06/19/22 189 lb (85.7 kg)  06/10/22 189 lb 6.4 oz (85.9 kg)  05/10/22 190 lb (86.2 kg)   Her blood  pressure has been controlled at home, today their BP is BP: 120/70.   BP Readings from Last 3 Encounters:  06/19/22 120/70  06/10/22 138/70  05/10/22 110/74   She does not workout. She denies chest pain, shortness of breath, dizziness.   She is on cholesterol medication (rosuvastatin 5 mg daily) and denies myalgias. Her cholesterol is not at goal of LDL <100. The cholesterol last visit was:  Lab Results  Component Value Date   CHOL 271 (H) 01/16/2022   HDL 39 (L) 01/16/2022   LDLCALC 205 (H) 01/16/2022   TRIG 125 01/16/2022   CHOLHDL 6.9 (H) 01/16/2022  . She has been working on diet and exercise for hx of prediabetes (5.7% in 2015, 5.8% in 03/2018), she is on bASA, she is not on ACE/ARB and denies foot ulcerations, hyperglycemia, hypoglycemia , increased appetite, nausea, paresthesia of the feet, polydipsia, polyuria, visual disturbances, vomiting and weight loss. Last A1C in the office was:  Lab Results  Component Value Date   HGBA1C 5.7 (H) 05/30/2021    Last GFR:  Lab Results  Component Value Date   GFRNONAA 76 08/30/2020   Patient is on Vitamin D supplement, taking 16109 IU daily    Lab Results  Component Value Date   VD25OH 76 05/30/2021     Last GFR:  Lab Results  Component Value Date   GFRNONAA 76 08/30/2020       Current Medications:  Current Outpatient Medications on File Prior to Visit  Medication Sig Dispense Refill   albuterol (VENTOLIN HFA) 108 (90 Base) MCG/ACT inhaler TAKE 2 PUFFS BY MOUTH EVERY 6 HOURS AS NEEDED FOR WHEEZE OR SHORTNESS OF BREATH 8.5 each 3   aspirin 81 MG tablet Take 81 mg by  mouth daily.      busPIRone (BUSPAR) 5 MG tablet TAKE 1 TABLET BY MOUTH TWICE A DAY 60 tablet 3   Cholecalciferol (VITAMIN D3) 125 MCG (5000 UT) CAPS Takes 2 capsules (10,000 units )  Daily     CINNAMON PO Take 1,200 mg by mouth.     ELDERBERRY PO Take by mouth. Calcium and vit c in this supplement     Ferrous Sulfate (IRON PO) Take 18 mg by mouth.      fluticasone (FLONASE) 50 MCG/ACT nasal spray Use  2 sprays  to each Nostril  Daily (Patient taking differently: as needed. Use  2 sprays  to each Nostril  Daily) 48 mL 3   Ibuprofen-diphenhydrAMINE Cit (ADVIL PM PO) Take by mouth.     montelukast (SINGULAIR) 10 MG tablet TAKE 1 TABLET BY MOUTH DAILY FOR ALLERGIES 90 tablet 3   omeprazole (PRILOSEC) 40 MG capsule Take  1 capsule  Daily to Prevent Heartburn & Indigestion                                 /                              TAKE                        BY                     MOUTH                        ONCE DAILY 90 capsule 3   rosuvastatin (CRESTOR) 5 MG tablet TAKE 1 TAB DAILY FOR CHOLESTEROL. 90 tablet 1   Zinc 50 MG TABS Take by mouth.     No current facility-administered medications on file prior to visit.    Health Maintenance:   Immunization History  Administered Date(s) Administered   Influenza, High Dose Seasonal PF 04/10/2018   PFIZER(Purple Top)SARS-COV-2 Vaccination 07/06/2019, 07/27/2019   Pneumococcal Conjugate-13 04/21/2019   Pneumococcal Polysaccharide-23 04/20/2020   Tdap 08/27/2012    Tetanus: 2014 Flu vaccine: 12/2019 Pneumonia: 04/20/20 Prevnar 13: 04/2019 Shingrix: ask insurance  Covid 19: 2/2, pfizer 2021  LMP: Hysterectomy Pap: Hysterectomy 2012, sees GYN every other year, last pelvic 12/2018 MGM: 02/14/21 reports completed 04/2022 - report requested DEXA: 01/04/21 osteopenia reports completed 04/2022 report requested  Colonoscopy: 2011, due 2021 wants to do cologuard Cologuard: 07/20/2019 neg due 07/2022  CT AB 08/01/2016 Stress test 2014  Last eye exam: remote, no issues, uses [employer] readers Last dental exam: Dr. Irving Copas, last visit 2020, overdue, reminded to schedule  Patient Care Team: Lucky Cowboy, MD as PCP - General (Internal Medicine) Drema Halon, MD (Inactive) as Consulting Physician (Otolaryngology) Nahser, Deloris Ping, MD as Consulting Physician (Cardiology) Louis Meckel,  MD (Inactive) as Consulting Physician (Gastroenterology) Miguel Aschoff, MD (Inactive) as Consulting Physician (Obstetrics and Gynecology)  Medical History:  Past Medical History:  Diagnosis Date   Anxiety and depression    Cholecystitis, acute with cholelithiasis 12/17/2013   Depression    Diverticulitis    DIVERTICULITIS 04/19/2009   Qualifier: Diagnosis of  By: Arlyce Dice MD, Barbette Hair    Hyperlipidemia    Hypertension    Irritable bowel syndrome    Allergies Allergies  Allergen Reactions  Atorvastatin Other (See Comments)    Leg cramps   Levofloxacin    Prednisone     SURGICAL HISTORY She  has a past surgical history that includes Tonsillectomy; Vaginal hysterectomy; Colon surgery (2012); Cholecystectomy (N/A, 12/17/2013); and Knee arthroscopy (Right, 2019). FAMILY HISTORY Her family history includes Brain cancer (age of onset: 43) in her sister; Cancer in her paternal aunt; Healthy in her son; Heart attack (age of onset: 110) in her brother; Heart attack (age of onset: 55) in her father; Heart disease in her brother, father, and mother; Hypertension in her mother; Irritable bowel syndrome in her sister; Osteoporosis in her mother; Throat cancer in her brother. SOCIAL HISTORY She  reports that she has never smoked. She has never been exposed to tobacco smoke. She has never used smokeless tobacco. She reports that she does not drink alcohol and does not use drugs.  Review of Systems: Review of Systems  Constitutional:  Negative for chills, fever and malaise/fatigue.  HENT:  Negative for congestion, ear pain, hearing loss, sinus pain, sore throat and tinnitus.   Eyes: Negative.  Negative for blurred vision and double vision.  Respiratory:  Negative for cough, hemoptysis, sputum production, shortness of breath and wheezing.   Cardiovascular:  Negative for chest pain, palpitations and leg swelling.  Gastrointestinal:  Positive for constipation (ibs) and diarrhea (ibs). Negative for  abdominal pain, blood in stool, heartburn, melena, nausea and vomiting.  Genitourinary: Negative.  Negative for dysuria and urgency.  Musculoskeletal:  Negative for back pain, falls, joint pain, myalgias and neck pain.  Skin: Negative.  Negative for rash.  Neurological:  Negative for dizziness, tingling, tremors, sensory change, loss of consciousness, weakness and headaches.  Endo/Heme/Allergies:  Does not bruise/bleed easily.  Psychiatric/Behavioral:  Positive for depression. Negative for substance abuse and suicidal ideas. The patient has insomnia. The patient is not nervous/anxious.     Physical Exam: Estimated body mass index is 30.51 kg/m as calculated from the following:   Height as of this encounter: 5\' 6"  (1.676 m).   Weight as of this encounter: 189 lb (85.7 kg). BP 120/70   Pulse 96   Temp 97.9 F (36.6 C)   Resp 16   Ht 5\' 6"  (1.676 m)   Wt 189 lb (85.7 kg)   SpO2 98%   BMI 30.51 kg/m   General Appearance: Well nourished well developed, in no apparent distress.  Eyes: PERRLA, EOMs, conjunctiva no swelling or erythema ENT/Mouth: Ear canals normal without obstruction, swelling, erythema, or discharge.  TMs normal bilaterally with no erythema, bulging, retraction, or loss of landmark.  Oropharynx moist and clear with no exudate, erythema, or swelling.   Neck: Supple, thyroid normal. No bruits.  No cervical adenopathy Respiratory: Respiratory effort normal, Breath sounds clear A&P without wheeze, rhonchi, rales.   Cardio: RRR without murmurs, rubs or gallops. Brisk peripheral pulses without edema.  Chest: symmetric, with normal excursions Breasts: Defer to GYN Abdomen: Soft, nontender, no guarding, rebound, hernias, masses, or organomegaly.  Lymphatics: Non tender without lymphadenopathy.  Musculoskeletal: Full ROM all peripheral extremities,5/5 strength, and normal gait.  Skin: Warm, dry without rashes, lesions, ecchymosis. Neuro: Awake and oriented X 3, Cranial nerves  intact, reflexes equal bilaterally. Normal muscle tone, no cerebellar symptoms. Sensation intact.  Psych:  normal affect, Insight and Judgment appropriate.   EKG: NSR, no ST changes  Krishay Faro 2:33 PM [facility]

## 2022-06-19 NOTE — Patient Instructions (Signed)

## 2022-06-20 LAB — URINALYSIS, ROUTINE W REFLEX MICROSCOPIC
Bacteria, UA: NONE SEEN /HPF
Bilirubin Urine: NEGATIVE
Glucose, UA: NEGATIVE
Hgb urine dipstick: NEGATIVE
Ketones, ur: NEGATIVE
Nitrite: NEGATIVE
Protein, ur: NEGATIVE
RBC / HPF: NONE SEEN /HPF (ref 0–2)
Specific Gravity, Urine: 1.022 (ref 1.001–1.035)
pH: 5.5 (ref 5.0–8.0)

## 2022-06-20 LAB — COMPLETE METABOLIC PANEL WITH GFR
AG Ratio: 1.8 (calc) (ref 1.0–2.5)
ALT: 6 U/L (ref 6–29)
AST: 14 U/L (ref 10–35)
Albumin: 4.4 g/dL (ref 3.6–5.1)
Alkaline phosphatase (APISO): 100 U/L (ref 37–153)
BUN: 12 mg/dL (ref 7–25)
CO2: 29 mmol/L (ref 20–32)
Calcium: 9.8 mg/dL (ref 8.6–10.4)
Chloride: 107 mmol/L (ref 98–110)
Creat: 0.79 mg/dL (ref 0.50–1.05)
Globulin: 2.4 g/dL (calc) (ref 1.9–3.7)
Glucose, Bld: 96 mg/dL (ref 65–99)
Potassium: 4.5 mmol/L (ref 3.5–5.3)
Sodium: 143 mmol/L (ref 135–146)
Total Bilirubin: 0.4 mg/dL (ref 0.2–1.2)
Total Protein: 6.8 g/dL (ref 6.1–8.1)
eGFR: 81 mL/min/{1.73_m2} (ref >=60)

## 2022-06-20 LAB — CBC WITH DIFFERENTIAL/PLATELET
Absolute Monocytes: 379 cells/uL (ref 200–950)
Basophils Absolute: 48 cells/uL (ref 0–200)
Basophils Relative: 1 %
Eosinophils Absolute: 120 cells/uL (ref 15–500)
Eosinophils Relative: 2.5 %
HCT: 40.8 % (ref 35.0–45.0)
Hemoglobin: 13.4 g/dL (ref 11.7–15.5)
Lymphs Abs: 864 cells/uL (ref 850–3900)
MCH: 30.3 pg (ref 27.0–33.0)
MCHC: 32.8 g/dL (ref 32.0–36.0)
MCV: 92.3 fL (ref 80.0–100.0)
MPV: 11.3 fL (ref 7.5–12.5)
Monocytes Relative: 7.9 %
Neutro Abs: 3389 cells/uL (ref 1500–7800)
Neutrophils Relative %: 70.6 %
Platelets: 193 10*3/uL (ref 140–400)
RBC: 4.42 10*6/uL (ref 3.80–5.10)
RDW: 11.9 % (ref 11.0–15.0)
Total Lymphocyte: 18 %
WBC: 4.8 10*3/uL (ref 3.8–10.8)

## 2022-06-20 LAB — LIPID PANEL
Cholesterol: 280 mg/dL — ABNORMAL HIGH (ref <200)
HDL: 41 mg/dL — ABNORMAL LOW (ref >=50)
LDL Cholesterol (Calc): 204 mg/dL (calc) — ABNORMAL HIGH
Non-HDL Cholesterol (Calc): 239 mg/dL (calc) — ABNORMAL HIGH (ref <130)
Total CHOL/HDL Ratio: 6.8 (calc) — ABNORMAL HIGH (ref <5.0)
Triglycerides: 179 mg/dL — ABNORMAL HIGH (ref <150)

## 2022-06-20 LAB — MAGNESIUM: Magnesium: 2 mg/dL (ref 1.5–2.5)

## 2022-06-20 LAB — MICROALBUMIN / CREATININE URINE RATIO
Creatinine, Urine: 163 mg/dL (ref 20–275)
Microalb Creat Ratio: 5 mg/g creat (ref <30)
Microalb, Ur: 0.8 mg/dL

## 2022-06-20 LAB — TSH: TSH: 3.24 mIU/L (ref 0.40–4.50)

## 2022-06-20 LAB — HEMOGLOBIN A1C
Hgb A1c MFr Bld: 5.6 % of total Hgb (ref <5.7)
Mean Plasma Glucose: 114 mg/dL
eAG (mmol/L): 6.3 mmol/L

## 2022-06-20 LAB — MICROSCOPIC MESSAGE

## 2022-06-20 LAB — VITAMIN D 25 HYDROXY (VIT D DEFICIENCY, FRACTURES): Vit D, 25-Hydroxy: 60 ng/mL (ref 30–100)

## 2022-06-20 LAB — INSULIN, RANDOM: Insulin: 10.3 u[IU]/mL

## 2022-06-27 ENCOUNTER — Encounter: Payer: Self-pay | Admitting: Nurse Practitioner

## 2022-06-27 DIAGNOSIS — F419 Anxiety disorder, unspecified: Secondary | ICD-10-CM

## 2022-06-28 ENCOUNTER — Other Ambulatory Visit: Payer: Self-pay | Admitting: Nurse Practitioner

## 2022-06-28 DIAGNOSIS — H9202 Otalgia, left ear: Secondary | ICD-10-CM

## 2022-06-28 DIAGNOSIS — J301 Allergic rhinitis due to pollen: Secondary | ICD-10-CM

## 2022-06-28 DIAGNOSIS — H6692 Otitis media, unspecified, left ear: Secondary | ICD-10-CM

## 2022-07-02 ENCOUNTER — Other Ambulatory Visit: Payer: Self-pay | Admitting: Internal Medicine

## 2022-07-04 MED ORDER — BUSPIRONE HCL 5 MG PO TABS: 5.0000 mg | ORAL_TABLET | Freq: Two times a day (BID) | ORAL | 3 refills | Status: DC

## 2022-08-04 DIAGNOSIS — Z1211 Encounter for screening for malignant neoplasm of colon: Secondary | ICD-10-CM | POA: Diagnosis not present

## 2022-08-05 DIAGNOSIS — Z0100 Encounter for examination of eyes and vision without abnormal findings: Secondary | ICD-10-CM | POA: Diagnosis not present

## 2022-08-10 LAB — COLOGUARD: COLOGUARD: NEGATIVE

## 2022-08-20 ENCOUNTER — Ambulatory Visit (INDEPENDENT_AMBULATORY_CARE_PROVIDER_SITE_OTHER): Payer: Medicare HMO | Admitting: Podiatry

## 2022-08-20 DIAGNOSIS — R768 Other specified abnormal immunological findings in serum: Secondary | ICD-10-CM | POA: Diagnosis not present

## 2022-08-20 DIAGNOSIS — G629 Polyneuropathy, unspecified: Secondary | ICD-10-CM

## 2022-08-20 MED ORDER — GABAPENTIN 100 MG PO CAPS: 100.0000 mg | ORAL_CAPSULE | Freq: Every day | ORAL | 0 refills | Status: DC

## 2022-08-20 NOTE — Patient Instructions (Signed)
Start with gabapentin 100mg  at night then you can go up to taking 2 tablets (200mg ) at night then up to 3 tablets (300mg ) at night if needed. Monitor for any side affects  --   Start a B-Complex Vitamin and Alpha-Lipoic Acid   --  Gabapentin Capsules or Tablets What is this medication? GABAPENTIN (GA ba pen tin) treats nerve pain. It may also be used to prevent and control seizures in people with epilepsy. It works by calming overactive nerves in your body. This medicine may be used for other purposes; ask your health care provider or pharmacist if you have questions. COMMON BRAND NAME(S): Active-PAC with Gabapentin, Ascencion Dike, Gralise, Neurontin What should I tell my care team before I take this medication? They need to know if you have any of these conditions: Kidney disease Lung or breathing disease Substance use disorder Suicidal thoughts, plans, or attempt by you or a family member An unusual or allergic reaction to gabapentin, other medications, foods, dyes, or preservatives Pregnant or trying to get pregnant Breastfeeding How should I use this medication? Take this medication by mouth with a glass of water. Follow the directions on the prescription label. You can take it with or without food. If it upsets your stomach, take it with food. Take your medication at regular intervals. Do not take it more often than directed. Do not stop taking except on your care team's advice. If you are directed to break the 600 or 800 mg tablets in half as part of your dose, the extra half tablet should be used for the next dose. If you have not used the extra half tablet within 28 days, it should be thrown away. A special MedGuide will be given to you by the pharmacist with each prescription and refill. Be sure to read this information carefully each time. Talk to your care team about the use of this medication in children. While this medication may be prescribed for children as young as 3 years for  selected conditions, precautions do apply. Overdosage: If you think you have taken too much of this medicine contact a poison control center or emergency room at once. NOTE: This medicine is only for you. Do not share this medicine with others. What if I miss a dose? If you miss a dose, take it as soon as you can. If it is almost time for your next dose, take only that dose. Do not take double or extra doses. What may interact with this medication? Alcohol Antihistamines for allergy, cough, and cold Certain medications for anxiety or sleep Certain medications for depression like amitriptyline, fluoxetine, sertraline Certain medications for seizures like phenobarbital, primidone Certain medications for stomach problems General anesthetics like halothane, isoflurane, methoxyflurane, propofol Local anesthetics like lidocaine, pramoxine, tetracaine Medications that relax muscles for surgery Opioid medications for pain Phenothiazines like chlorpromazine, mesoridazine, prochlorperazine, thioridazine This list may not describe all possible interactions. Give your health care provider a list of all the medicines, herbs, non-prescription drugs, or dietary supplements you use. Also tell them if you smoke, drink alcohol, or use illegal drugs. Some items may interact with your medicine. What should I watch for while using this medication? Visit your care team for regular checks on your progress. You may want to keep a record at home of how you feel your condition is responding to treatment. You may want to share this information with your care team at each visit. You should contact your care team if your seizures get worse or if  you have any new types of seizures. Do not stop taking this medication or any of your seizure medications unless instructed by your care team. Stopping your medication suddenly can increase your seizures or their severity. This medication may cause serious skin reactions. They can  happen weeks to months after starting the medication. Contact your care team right away if you notice fevers or flu-like symptoms with a rash. The rash may be red or purple and then turn into blisters or peeling of the skin. Or, you might notice a red rash with swelling of the face, lips or lymph nodes in your neck or under your arms. Wear a medical identification bracelet or chain if you are taking this medication for seizures. Carry a card that lists all your medications. This medication may affect your coordination, reaction time, or judgment. Do not drive or operate machinery until you know how this medication affects you. Sit up or stand slowly to reduce the risk of dizzy or fainting spells. Drinking alcohol with this medication can increase the risk of these side effects. Your mouth may get dry. Chewing sugarless gum or sucking hard candy, and drinking plenty of water may help. Watch for new or worsening thoughts of suicide or depression. This includes sudden changes in mood, behaviors, or thoughts. These changes can happen at any time but are more common in the beginning of treatment or after a change in dose. Call your care team right away if you experience these thoughts or worsening depression. If you become pregnant while using this medication, you may enroll in the [place] American Antiepileptic Drug Pregnancy Registry by calling [phone]. This registry collects information about the safety of antiepileptic medication use during pregnancy. What side effects may I notice from receiving this medication? Side effects that you should report to your care team as soon as possible: Allergic reactions or angioedema--skin rash, itching, hives, swelling of the face, eyes, lips, tongue, arms, or legs, trouble swallowing or breathing Rash, fever, and swollen lymph nodes Thoughts of suicide or self harm, worsening mood, feelings of depression Trouble breathing Unusual changes in mood or behavior in  children after use such as difficulty concentrating, hostility, or restlessness Side effects that usually do not require medical attention (report to your care team if they continue or are bothersome): Dizziness Drowsiness Nausea Swelling of ankles, feet, or hands Vomiting This list may not describe all possible side effects. Call your doctor for medical advice about side effects. You may report side effects to FDA at 1-800-FDA-1088. Where should I keep my medication? Keep out of reach of children and pets. Store at room temperature between 15 and 30 degrees C (59 and 86 degrees F). Get rid of any unused medication after the expiration date. This medication may cause accidental overdose and death if taken by other adults, children, or pets. To get rid of medications that are no longer needed or have expired: Take the medication to a medication take-back program. Check with your pharmacy or law enforcement to find a location. If you cannot return the medication, check the label or package insert to see if the medication should be thrown out in the garbage or flushed down the toilet. If you are not sure, ask your care team. If it is safe to put it in the trash, empty the medication out of the container. Mix the medication with cat litter, dirt, coffee grounds, or other unwanted substance. Seal the mixture in a bag or container. Put it in the trash.  NOTE: This sheet is a summary. It may not cover all possible information. If you have questions about this medicine, talk to your doctor, pharmacist, or health care provider.  2024 Elsevier/Gold Standard (2021-12-04 00:00:00)

## 2022-08-21 DIAGNOSIS — Z8719 Personal history of other diseases of the digestive system: Secondary | ICD-10-CM | POA: Insufficient documentation

## 2022-08-21 DIAGNOSIS — H9202 Otalgia, left ear: Secondary | ICD-10-CM | POA: Insufficient documentation

## 2022-08-27 NOTE — Progress Notes (Signed)
Subjective: Chief Complaint  Patient presents with   Foot Pain    Pt states that when she lays down her feet ache really bad but in the day they do not really bother her . She feels like a force bringing both of her feet down and at times she feels like a pin prick. Pt states that her last visit she got a shot and it helped a lot.    72 year old female presents the office today for follow-up evaluation of neuropathy as well as foot pain.  She says her current symptoms.  Pain having discomfort.  Injection last appointment did help but she is not having any heel pain today.  Objective: AAO x3, NAD DP/PT pulses palpable bilaterally, CRT less than 3 seconds There is no significant skin discoloration changes noted today.  There is no significant tenderness palpation on the plantar aspect calcaneal insertion of plantar fascia today.  There is no pain with lateral compression of calcaneus.  No specific area of pinpoint tenderness.  No edema, erythema.  Flexor, extensor tendons appear to be intact.  No Tinel sign. No pain with calf compression, swelling, warmth, erythema  Assessment: Neuropathy, history of plantar fasciitis  Plan: -All treatment options discussed with the patient including all alternatives, risks, complications.  -We discussed repeat steroid injection to the plantar fascia however she denies any discomfort at this area has not been bothering her stability the injections will be helpful but if her symptoms do recur we can always consider this. -Will start gabapentin.  Will start on a milligrams at nighttime and titrate up as tolerated.  Also recommend B complex vitamin as well as alpha lipoic acid. -Consider nerve biopsy as well. -Continue shoes and good arch support as well as stretching, icing a regular basis.  Vivi Barrack DPM

## 2022-09-02 ENCOUNTER — Ambulatory Visit: Payer: Medicare HMO | Admitting: Nurse Practitioner

## 2022-09-02 DIAGNOSIS — T148XXA Other injury of unspecified body region, initial encounter: Secondary | ICD-10-CM

## 2022-09-02 HISTORY — DX: Other injury of unspecified body region, initial encounter: T14.8XXA

## 2022-09-19 ENCOUNTER — Ambulatory Visit: Payer: Medicare HMO | Admitting: Nurse Practitioner

## 2022-09-22 NOTE — Progress Notes (Deleted)
ANNUAL WELLNESS VISIT AND FOLLOW UP   Assessment and Plan:   Annual Medicare Wellness Visit Due annually  Health maintenance reviewed - check about shingrix at pharmacy   Allergic rhinitis due to pollen, unspecified seasonality Continue allergy medication  Irritable bowel syndrome, unspecified type If not on benefiber then add it, decrease stress,  if any worsening symptoms, blood in stool, AB pain, etc call office  Elevated blood pressure reading without diagnosis of hypertension - continue medications, DASH diet, exercise and monitor at home. Call if greater than 130/80.  -     CBC with Differential/Platelet -     CMP/GFR  Other abnormal glucose Discussed disease progression and risks Discussed diet/exercise, weight management and risk modification  Mixed hyperlipidemia -continue medications - rosuvastatin 5 mg daily  - LDL goal <100, check lipids, decrease fatty foods, increase activity.  -     Lipid panel -     TSH -     CMP/GFR  Vitamin D deficiency Continue Vit D supplementation to maintain value in therapeutic level of 60-100  -     VITAMIN D 25 Hydroxy (Vit-D Deficiency, Fractures)  Obesity- BMI 32 Long discussion about weight loss, diet, and exercise Recommended diet heavy in fruits and veggies and low in animal meats, cheeses, and dairy products, appropriate calorie intake Discussed appropriate weight for height  Continue Phentermine and Topamax, excellent progress.  Follow up at next visit  Medication management -     Magnesium  Estrogen def/Osteopenia DEXA UTD Encouraged high calcium diet, vitamin D supplement as indicated and weight bearing exercises  Recurrent depression in partial remission /Anxiety state/ insomnia  Currently only using Alprazolam 1 mg PRN for sleep  Last script for Alprazolam 1 mg #60 filled on 08/16/2021 Lifestyle discussed: diet/exerise, sleep hygiene, stress management, hydration  - discussed: New guidelines suggest the  benzodiazepines are best short term, with prolonged use they lead to physical and psychological dependence. In addition, evidence suggest that for insomnia the effectiveness wanes in 4 weeks and the risks out weight their benefits. Use of these agents have been associated with dementia, falls, motor vehicle accidents and physical addiction. Decreasing these medication have been proven to show improvements in cognition, alertness, decrease of falls and daytime sedation.   - she is not ready to fully commit to tapering off, difficult time with her husband, receptive to trying benadryl, trying 1/2 tab xanax only when doing well, receptive to revisit taper discussion at next visit.   No orders of the defined types were placed in this encounter.   Discussed med's effects and SE's. Screening labs and tests as requested with regular follow-up as recommended. Future Appointments  Date Time Provider Department Center  09/24/2022  2:00 PM Adela Glimpse, NP [facility] None  10/18/2022 12:15 PM Vivi Barrack, DPM [facility]  10/30/2022  1:00 PM Pollyann Savoy, MD [facility] None  06/19/2023  2:00 PM Adela Glimpse, NP [facility] None    Plan:   During the course of the visit the patient was educated and counseled about appropriate screening and preventive services including:   Pneumococcal vaccine  Prevnar 13 Influenza vaccine Td vaccine Screening electrocardiogram Bone densitometry screening Colorectal cancer screening Diabetes screening Glaucoma screening Nutrition counseling  Advanced directives: requested    HPI  72 y.o. female  presents for an AWV and follow up. She has Anxiety; Major depressive disorder in partial remission (HCC); IRRITABLE BOWEL SYNDROME; Allergic rhinitis; Vitamin D deficiency; Elevated blood pressure reading without diagnosis of hypertension; Other  abnormal glucose (prediabetes); Mixed hyperlipidemia; Obesity (BMI 30.0-34.9); GERD (gastroesophageal reflux  disease); Plantar fasciitis of left foot; Insomnia due to psychological stress; Iron deficiency anemia; Osteopenia; and Positive ANA (antinuclear antibody) on their problem list.   She is married, she has 1 child and 2 grandkids, watches occasionally.  She is retired but still helps with polling in [employer]  Frustrated because husband has been moody and irritable this past year, not acting himself.   Hx of MDD/anxiety in remission, off of lexapro, has xanax 1 mg script but reports only using at night for sleep. Never during the day. She intermittently uses benadryl as well.   She is following with Dr. Tenny Craw for GYN every 2 years, saw 02/01/2021 and had mammogram and pelvic, has upcoming this year.  Has hysterectomy sparing ovaries in 1992.   IBS doing better since her weight loss efforts.   Recently saw podiatry Dr. Ardelle Anton for foot pain, had sig + ANA 1:1280 in 06/19/2021 and was referred to rheum Dr. Corliss Skains, has pending 10/05/2021.  BMI is There is no height or weight on file to calculate BMI., she has been working on diet and exercise, down 12 lb since last visit.  She has been prescribed phentermine/topamax for weight loss with benefit, down 12 lb since last visit.  Wt Readings from Last 3 Encounters:  06/19/22 189 lb (85.7 kg)  06/10/22 189 lb 6.4 oz (85.9 kg)  05/10/22 190 lb (86.2 kg)   Her blood pressure has been controlled at home, today their BP is  .   BP Readings from Last 3 Encounters:  06/19/22 120/70  06/10/22 138/70  05/10/22 110/74   She does not workout. She denies chest pain, shortness of breath, dizziness.   She is on cholesterol medication (rosuvastatin 5 mg daily, has increased from 3 days a week, hx of myalgia with atorvastatin) and denies myalgias. Her cholesterol is not at goal of LDL <100. The cholesterol last visit was:  Lab Results  Component Value Date   CHOL 280 (H) 06/19/2022   HDL 41 (L) 06/19/2022   LDLCALC 204 (H) 06/19/2022   TRIG 179 (H)  06/19/2022   CHOLHDL 6.8 (H) 06/19/2022  . She hasn't been working on diet and exercise for hx of prediabetes (5.7% in 2015, 5.8% in 03/2018), she is on bASA, she is not on ACE/ARB and denies foot ulcerations, hyperglycemia, hypoglycemia , increased appetite, nausea, paresthesia of the feet, polydipsia, polyuria, visual disturbances, vomiting and weight loss. Last A1C in the office was:  Lab Results  Component Value Date   HGBA1C 5.6 06/19/2022    Last GFR:  Lab Results  Component Value Date   EGFR 81 06/19/2022   Patient is on Vitamin D supplement, taking 04540 IU daily    Lab Results  Component Value Date   VD25OH 60 06/19/2022     Last GFR:  Lab Results  Component Value Date   GFRNONAA 76 08/30/2020   She has been on an iron supplement for mild deficiency, no anemia -  Lab Results  Component Value Date   IRON 94 08/30/2021   TIBC 279 08/30/2021   FERRITIN 29 08/30/2021     Current Medications:  Current Outpatient Medications on File Prior to Visit  Medication Sig Dispense Refill   albuterol (VENTOLIN HFA) 108 (90 Base) MCG/ACT inhaler TAKE 2 PUFFS BY MOUTH EVERY 6 HOURS AS NEEDED FOR WHEEZE OR SHORTNESS OF BREATH 8.5 each 3   aspirin 81 MG tablet Take 81 mg  by mouth daily.      busPIRone (BUSPAR) 5 MG tablet Take 1 tablet (5 mg total) by mouth 2 (two) times daily. 60 tablet 3   Cholecalciferol (VITAMIN D3) 125 MCG (5000 UT) CAPS Takes 2 capsules (10,000 units )  Daily     CINNAMON PO Take 1,200 mg by mouth.     ELDERBERRY PO Take by mouth. Calcium and vit c in this supplement     Ferrous Sulfate (IRON PO) Take 18 mg by mouth.     fluticasone (FLONASE) 50 MCG/ACT nasal spray USE 2 SPRAYS TO EACH NOSTRIL DAILY 48 mL 3   gabapentin (NEURONTIN) 100 MG capsule Take 1 capsule (100 mg total) by mouth at bedtime. 90 capsule 0   Ibuprofen-diphenhydrAMINE Cit (ADVIL PM PO) Take by mouth.     montelukast (SINGULAIR) 10 MG tablet TAKE 1 TABLET BY MOUTH DAILY FOR ALLERGIES 90 tablet  3   omeprazole (PRILOSEC) 40 MG capsule Take  1 capsule  Daily to Prevent Heartburn & Indigestion                                 /                              TAKE                        BY                     MOUTH                        ONCE DAILY 90 capsule 3   rosuvastatin (CRESTOR) 5 MG tablet TAKE 1 TAB DAILY FOR CHOLESTEROL. 90 tablet 1   Zinc 50 MG TABS Take by mouth.     No current facility-administered medications on file prior to visit.    Health Maintenance:   Immunization History  Administered Date(s) Administered   Influenza, High Dose Seasonal PF 04/10/2018   PFIZER(Purple Top)SARS-COV-2 Vaccination 07/06/2019, 07/27/2019   Pneumococcal Conjugate-13 04/21/2019   Pneumococcal Polysaccharide-23 04/20/2020   Tdap 08/27/2012   Health Maintenance  Topic Date Due   Zoster Vaccines- Shingrix (1 of 2) Never done   COVID-19 Vaccine (3 - Pfizer risk series) 08/24/2019   MAMMOGRAM  02/14/2022   DTaP/Tdap/Td (2 - Td or Tdap) 08/28/2022   Medicare Annual Wellness (AWV)  08/31/2022   Fecal DNA (Cologuard)  08/03/2025   Pneumonia Vaccine 33+ Years old  Completed   DEXA SCAN  Completed   Hepatitis C Screening  Completed   HPV VACCINES  Aged Out   INFLUENZA VACCINE  Discontinued   Colonoscopy  Discontinued    Shingrix: ask insurance   Pap: Hysterectomy 2012, sees GYN every other year, last pelvic 02/01/2021 MGM: 02/14/21 DEXA: 01/04/21 osteopenia  -   Colonoscopy: 2011 Cologuard: 07/20/2019 neg  Last eye exam: dr. Marland Kitchen 2022, no issues, uses [employer] [facility] Last dental exam: Dr. Irving Copas, last visit 2022,  Patient Care Team: Lucky Cowboy, MD as PCP - General (Internal Medicine) Drema Halon, MD (Inactive) as Consulting Physician (Otolaryngology) Nahser, Deloris Ping, MD as Consulting Physician (Cardiology) Louis Meckel, MD (Inactive) as Consulting Physician (Gastroenterology) Miguel Aschoff, MD (Inactive) as Consulting Physician (Obstetrics and  Gynecology)  Medical History:  Past Medical History:  Diagnosis Date   Anxiety and depression    Cholecystitis, acute with cholelithiasis 12/17/2013   Depression    Diverticulitis    DIVERTICULITIS 04/19/2009   Qualifier: Diagnosis of  By: Arlyce Dice MD, Barbette Hair    Hyperlipidemia    Hypertension    Irritable bowel syndrome    Allergies Allergies  Allergen Reactions   Atorvastatin Other (See Comments)    Leg cramps   Levofloxacin    Prednisone     SURGICAL HISTORY She  has a past surgical history that includes Tonsillectomy; Vaginal hysterectomy; Colon surgery (2012); Cholecystectomy (N/A, 12/17/2013); and Knee arthroscopy (Right, 2019). FAMILY HISTORY Her family history includes Brain cancer (age of onset: 30) in her sister; Cancer in her paternal aunt; Healthy in her son; Heart attack (age of onset: 82) in her brother; Heart attack (age of onset: 21) in her father; Heart disease in her brother, father, and mother; Hypertension in her mother; Irritable bowel syndrome in her sister; Osteoporosis in her mother; Throat cancer in her brother. SOCIAL HISTORY She  reports that she has never smoked. She has never been exposed to tobacco smoke. She has never used smokeless tobacco. She reports that she does not drink alcohol and does not use drugs.  MEDICARE WELLNESS OBJECTIVES: Physical activity:   Cardiac risk factors:   Depression/mood screen:      08/30/2021    2:13 PM  Depression screen PHQ 2/9  Decreased Interest 0  Down, Depressed, Hopeless 0  PHQ - 2 Score 0    ADLs:      No data to display           Cognitive Testing  Alert? Yes  Normal Appearance?Yes  Oriented to person? Yes  Place? Yes   Time? Yes  Recall of three objects?  Yes  Can perform simple calculations? Yes  Displays appropriate judgment?Yes  Can read the correct time from a watch face?Yes  EOL planning:       Review of Systems: Review of Systems  Constitutional:  Negative for chills, fever and  malaise/fatigue.  HENT:  Negative for congestion, ear pain, hearing loss, sinus pain, sore throat and tinnitus.   Eyes: Negative.  Negative for blurred vision and double vision.  Respiratory:  Negative for cough, hemoptysis, sputum production, shortness of breath and wheezing.   Cardiovascular:  Negative for chest pain, palpitations and leg swelling.  Gastrointestinal:  Positive for constipation (ibs) and diarrhea (ibs). Negative for abdominal pain, blood in stool, heartburn, melena, nausea and vomiting.  Genitourinary: Negative.  Negative for dysuria and urgency.  Musculoskeletal:  Negative for back pain, falls, joint pain, myalgias and neck pain.  Skin: Negative.  Negative for rash.  Neurological:  Negative for dizziness, tingling, tremors, sensory change, loss of consciousness, weakness and headaches.  Endo/Heme/Allergies:  Does not bruise/bleed easily.  Psychiatric/Behavioral:  Positive for depression. Negative for substance abuse and suicidal ideas. The patient has insomnia. The patient is not nervous/anxious.     Physical Exam: Estimated body mass index is 30.51 kg/m as calculated from the following:   Height as of 06/19/22: 5\' 6"  (1.676 m).   Weight as of 06/19/22: 189 lb (85.7 kg). There were no vitals taken for this visit.  General Appearance: Well nourished well developed, in no apparent distress.  Eyes: PERRLA, EOMs, conjunctiva no swelling or erythema ENT/Mouth: Ear canals normal without obstruction, swelling, erythema, or discharge.  TMs normal bilaterally with no erythema, bulging, retraction, or loss of landmark.  Oropharynx moist and clear with  no exudate, erythema, or swelling.   Neck: Supple, thyroid normal. No bruits.  No cervical adenopathy Respiratory: Respiratory effort normal, Breath sounds clear A&P without wheeze, rhonchi, rales.   Cardio: RRR without murmurs, rubs or gallops. Brisk peripheral pulses without edema.  Chest: symmetric, with normal excursions Abdomen:  Soft, nontender, no guarding, rebound, hernias, masses, or organomegaly.  Lymphatics: Non tender without lymphadenopathy.  Musculoskeletal: Full ROM all peripheral extremities,5/5 strength, and normal gait.  Skin: Warm, dry without rashes, lesions, ecchymosis. Neuro: Awake and oriented X 3, Cranial nerves intact, reflexes equal bilaterally. Normal muscle tone, no cerebellar symptoms. Sensation intact.  Psych:  normal affect, Insight and Judgment appropriate.    Medicare Attestation I have personally reviewed: The patient's medical and social history Their use of alcohol, tobacco or illicit drugs Their current medications and supplements The patient's functional ability including ADLs,fall risks, home safety risks, cognitive, and hearing and visual impairment Diet and physical activities Evidence for depression or mood disorders  The patient's weight, height, BMI, and visual acuity have been recorded in the chart.  I have made referrals, counseling, and provided education to the patient based on review of the above and I have provided the patient with a written personalized care plan for preventive services.     Over 40 minutes of exam, counseling, chart review and critical decision making was performed  Adela Glimpse, NP-C 10:43 PM [facility]

## 2022-09-24 ENCOUNTER — Ambulatory Visit: Payer: Medicare HMO | Admitting: Nurse Practitioner

## 2022-09-26 ENCOUNTER — Other Ambulatory Visit: Payer: Self-pay | Admitting: Internal Medicine

## 2022-09-26 DIAGNOSIS — E669 Obesity, unspecified: Secondary | ICD-10-CM

## 2022-09-26 MED ORDER — PHENTERMINE HCL 37.5 MG PO TABS
ORAL_TABLET | ORAL | 1 refills | Status: DC
Start: 2022-09-26 — End: 2022-10-06

## 2022-09-27 ENCOUNTER — Emergency Department (HOSPITAL_COMMUNITY): Payer: Medicare HMO

## 2022-09-27 ENCOUNTER — Encounter (HOSPITAL_COMMUNITY): Payer: Self-pay

## 2022-09-27 ENCOUNTER — Emergency Department (HOSPITAL_COMMUNITY)
Admission: EM | Admit: 2022-09-27 | Discharge: 2022-09-27 | Disposition: A | Discharge status: Home / Self Care | Payer: Medicare HMO | Attending: Emergency Medicine | Admitting: Emergency Medicine

## 2022-09-27 ENCOUNTER — Other Ambulatory Visit: Payer: Self-pay

## 2022-09-27 DIAGNOSIS — Y9301 Activity, walking, marching and hiking: Secondary | ICD-10-CM | POA: Insufficient documentation

## 2022-09-27 DIAGNOSIS — S6992XA Unspecified injury of left wrist, hand and finger(s), initial encounter: Secondary | ICD-10-CM | POA: Diagnosis not present

## 2022-09-27 DIAGNOSIS — S62102A Fracture of unspecified carpal bone, left wrist, initial encounter for closed fracture: Secondary | ICD-10-CM

## 2022-09-27 DIAGNOSIS — M1812 Unilateral primary osteoarthritis of first carpometacarpal joint, left hand: Secondary | ICD-10-CM | POA: Diagnosis not present

## 2022-09-27 DIAGNOSIS — W01198A Fall on same level from slipping, tripping and stumbling with subsequent striking against other object, initial encounter: Secondary | ICD-10-CM | POA: Insufficient documentation

## 2022-09-27 DIAGNOSIS — S52502A Unspecified fracture of the lower end of left radius, initial encounter for closed fracture: Secondary | ICD-10-CM | POA: Diagnosis not present

## 2022-09-27 DIAGNOSIS — Y92512 Supermarket, store or market as the place of occurrence of the external cause: Secondary | ICD-10-CM | POA: Insufficient documentation

## 2022-09-27 DIAGNOSIS — S0993XA Unspecified injury of face, initial encounter: Secondary | ICD-10-CM | POA: Insufficient documentation

## 2022-09-27 DIAGNOSIS — W19XXXA Unspecified fall, initial encounter: Secondary | ICD-10-CM | POA: Diagnosis not present

## 2022-09-27 DIAGNOSIS — S52352A Displaced comminuted fracture of shaft of radius, left arm, initial encounter for closed fracture: Secondary | ICD-10-CM | POA: Diagnosis not present

## 2022-09-27 DIAGNOSIS — S00502A Unspecified superficial injury of oral cavity, initial encounter: Secondary | ICD-10-CM | POA: Diagnosis not present

## 2022-09-27 MED ORDER — ONDANSETRON 4 MG PO TBDP
4.0000 mg | ORAL_TABLET | Freq: Once | ORAL | Status: AC
  Administered 2022-09-27: 4 mg via ORAL
  Filled 2022-09-27: qty 1

## 2022-09-27 MED ORDER — OXYCODONE HCL 5 MG PO TABS
5.0000 mg | ORAL_TABLET | Freq: Four times a day (QID) | ORAL | 0 refills | Status: DC | PRN
Start: 2022-09-27 — End: 2023-01-08

## 2022-09-27 MED ORDER — ACETAMINOPHEN 500 MG PO TABS
1000.0000 mg | ORAL_TABLET | Freq: Once | ORAL | Status: AC
  Administered 2022-09-27: 1000 mg via ORAL
  Filled 2022-09-27: qty 2

## 2022-09-27 MED ORDER — IBUPROFEN 400 MG PO TABS
600.0000 mg | ORAL_TABLET | Freq: Once | ORAL | Status: AC
  Administered 2022-09-27: 600 mg via ORAL
  Filled 2022-09-27: qty 1

## 2022-09-27 MED ORDER — ONDANSETRON 4 MG PO TBDP
ORAL_TABLET | ORAL | Status: AC
  Filled 2022-09-27: qty 1

## 2022-09-27 MED ORDER — OXYCODONE HCL 5 MG PO TABS
5.0000 mg | ORAL_TABLET | Freq: Once | ORAL | Status: AC
  Administered 2022-09-27: 5 mg via ORAL
  Filled 2022-09-27: qty 1

## 2022-09-27 MED ORDER — OXYCODONE-ACETAMINOPHEN 5-325 MG PO TABS
2.0000 | ORAL_TABLET | Freq: Once | ORAL | Status: AC
  Administered 2022-09-27: 2 via ORAL
  Filled 2022-09-27: qty 2

## 2022-09-27 NOTE — ED Triage Notes (Signed)
Pt was walking out of [employer] and tripped over a rock and pt fell forward and hit teeth off of pavement and tried to stop the fall with her left arm and injured her wrist today. Pt denies blood thinners. PT was sent by [facility] due to comminuted fx of left wrist and malocclusion of teeth and to get facial CT. Pt states she doesn't think she had LOC. Pt states her crown fell out this morning while brushing teeth. Pt left wrist was casted at UC. Pt has a bruised lip and chin.

## 2022-09-27 NOTE — Discharge Instructions (Addendum)
You were seen today for fall. We did not identify any emergent cause for your symptoms. Your evaluation is most consistent with left wrist fracture.   Plan and next steps:   The following may be helpful in managing your symptoms:   Pain- Lidocaine Patches  Apply to affected area for up to 12 hours at a time.   Pain/Fever- Adult Tylenol dosing:  650 mg orally every 4 to 6 hours as needed, MAX: 3250 mg/24 hours   (Extra-strength) 1000 mg orally every 6 hours as needed; MAX: 3000 mg/24 hours   Do not use if you have liver disease. Read the label on the bottle.   Pain/Fever- Adult Ibuprofen Dosing  200 to 400 mg orally every 4 to 6 hours as needed; MAX 1200 mg/day; do not take longer than 10 days   Do not use if you have kidney disease. Read the label on the bottle   Findings:  You may see all of your lab and imaging results utilizing our online portal! Look in this document or ask a team member for your mychart* access information. The most notable results have additionally been verbally communicated with you and your bedside family.    Follow-up Plan:   Follow up with the patient's normal primary care provider for monitoring of this condition within 48 hours.  I also recommend following up with Dr. Melvyn Novas or any hand surgeon with [facility].  Call them on Monday to make an appointment. Furthermore, follow-up with your dentist for your teeth injury and keep yourself on a soft diet this weekend. For treatment of your pain I additionally sent a oxycodone prescription.  Unfortunately the electronic prescribing system is not functioning tonight.  I have written you a paper prescription that you can take to the pharmacy.  However occasionally pharmacies will refuse a written narcotic prescription.  They should be able to call our emergency department to fill the prescription. Signs/Symptoms that would warrant return to the ED:  Please return to the ED if you experience worsening of symptoms or  any abrupt changes in your health. Standard of care precautions for your chief complaint have already been verbally communicated with you. Always be on alert for fevers, chills, shortness of breath, chest pains, or sudden changes that warrant immediate evaluation.    Thank you for allowing [place] to be a part of you and your families' care.   Glyn Ade MD

## 2022-09-27 NOTE — ED Provider Triage Note (Signed)
Emergency Medicine Provider Triage Evaluation Note  Teresa Vasquez , a 72 y.o. female  was evaluated in triage.  Pt complains of severe left hand and wrist pain as well as facial pain.  Patient had a mechanical fall today in the [place] parking lot.  She fell onto an outstretched hand and hit her teeth on the cement.  She was missing a crown and does not have any broken teeth.  She was seen in an outpatient facility and sent in for comminuted risk fracture..  Review of Systems  Positive: Left wrist pain Negative: Fever  Physical Exam  BP (!) 149/69   Pulse 93   Temp 97.9 F (36.6 C) (Oral)   Resp 17   Ht 5\' 6"  (1.676 m)   Wt 85.7 kg   SpO2 100%   BMI 30.49 kg/m  Gen:   Awake, no distress   Resp:  Normal effort  MSK:   Moves extremities without difficulty  Other:    Medical Decision Making  Medically screening exam initiated at 6:47 PM.  Appropriate orders placed.  Teresa Vasquez was informed that the remainder of the evaluation will be completed by another provider, this initial triage assessment does not replace that evaluation, and the importance of remaining in the ED until their evaluation is complete.     Arthor Captain, PA-C 09/27/22 1849

## 2022-09-27 NOTE — ED Provider Notes (Signed)
[place] EMERGENCY DEPARTMENT AT [facility] Provider Note   CSN: [id number] Arrival date & time: 09/27/22  1726     History Chief Complaint  Patient presents with   Fall    HPI Teresa Vasquez is a 72 y.o. female presenting for chief complaint of ground-level fall.  States she was walking at [facility] lost her balance fell forward with a left fall on outstretched hand.  She did hit her face on the asphalt.  Sent in by urgent care with a comminuted wrist fracture already splinted and a concern for facial injury.  She denies fevers chills nausea vomiting syncope shortness of breath.  Otherwise healthy up-to-date on vaccines without utilization of anticoagulation..   Patient's recorded medical, surgical, social, medication list and allergies were reviewed in the Snapshot window as part of the initial history.   Review of Systems   Review of Systems  Constitutional:  Negative for chills and fever.  HENT:  Negative for ear pain and sore throat.   Eyes:  Negative for pain and visual disturbance.  Respiratory:  Negative for cough and shortness of breath.   Cardiovascular:  Negative for chest pain and palpitations.  Gastrointestinal:  Negative for abdominal pain and vomiting.  Genitourinary:  Negative for dysuria and hematuria.  Musculoskeletal:  Negative for arthralgias and back pain.  Skin:  Negative for color change and rash.  Neurological:  Negative for seizures and syncope.  All other systems reviewed and are negative.   Physical Exam Updated Vital Signs BP (!) 149/69   Pulse 93   Temp 97.9 F (36.6 C) (Oral)   Resp 17   Ht 5\' 6"  (1.676 m)   Wt 85.7 kg   SpO2 100%   BMI 30.49 kg/m  Physical Exam Vitals and nursing note reviewed.  Constitutional:      General: She is not in acute distress.    Appearance: She is well-developed.  HENT:     Head: Normocephalic and atraumatic.  Eyes:     Conjunctiva/sclera: Conjunctivae normal.  Cardiovascular:     Rate  and Rhythm: Normal rate and regular rhythm.     Heart sounds: No murmur heard. Pulmonary:     Effort: Pulmonary effort is normal. No respiratory distress.     Breath sounds: Normal breath sounds.  Abdominal:     General: There is no distension.     Palpations: Abdomen is soft.     Tenderness: There is no abdominal tenderness. There is no right CVA tenderness or left CVA tenderness.  Musculoskeletal:        General: Deformity (Obvious left wrist deformity.  Palpable pulses.  Neurovascularly intact.  Deformity of the lower lip without laceration or obvious alveolar ridge disruption.) present. No swelling or tenderness. Normal range of motion.     Cervical back: Neck supple.  Skin:    General: Skin is warm and dry.  Neurological:     General: No focal deficit present.     Mental Status: She is alert and oriented to person, place, and time. Mental status is at baseline.     Cranial Nerves: No cranial nerve deficit.      ED Course/ Medical Decision Making/ A&P    Procedures Procedures   Medications Ordered in ED Medications  acetaminophen (TYLENOL) tablet 1,000 mg (has no administration in time range)  ibuprofen (ADVIL) tablet 600 mg (has no administration in time range)  oxyCODONE (Oxy IR/ROXICODONE) immediate release tablet 5 mg (has no administration in time range)  oxyCODONE-acetaminophen (PERCOCET/ROXICET) 5-325 MG per tablet 2 tablet (2 tablets Oral Given 09/27/22 1849)  ondansetron (ZOFRAN-ODT) disintegrating tablet 4 mg (4 mg Oral Given 09/27/22 1850)   Medical Decision Making:    Teresa Vasquez is a 72 y.o. female who presented to the ED today with a moderate mechanisma trauma, detailed above.    Patient placed on continuous vitals and telemetry monitoring while in ED which was reviewed periodically.   Given this mechanism of trauma, a full physical exam was performed. Notably, patient was HDS in NAD.   Reviewed and confirmed nursing documentation for past medical  history, family history, social history.    Initial Assessment/Plan:   This is a patient presenting with a moderate mechanism trauma.  As such, I have considered intracranial injuries including intracranial hemorrhage, intrathoracic injuries including blunt myocardial or blunt lung injury, blunt abdominal injuries including aortic dissection, bladder injury, spleen injury, liver injury and I have considered orthopedic injuries including extremity or spinal injury.  With the patient's presentation of moderate mechanism trauma but an otherwise reassuring exam, patient warrants targeted evaluation for potential traumatic injuries. Will proceed with targeted evaluation for potential injuries. Will proceed with CTF and left wrist. Objective evaluation resulted with no focal neurologic pathology/no focal facial injury.  Does have a left wrist fracture that is relatively well-approximated.  Will refer to orthopedic surgery for evaluation within 7 days already splinted well by outside facility.  Pain control prescribed.  Electronic system is not functioning will prescribe written pain prescription and informed patient that they may need to call back tomorrow if the pharmacy was refusing the paper prescription.  Disposition:  I have considered need for hospitalization, however, considering all of the above, I believe this patient is stable for discharge at this time.  Patient/family educated about specific return precautions for given chief complaint and symptoms.  Patient/family educated about follow-up with PCP and orthopedics.     Patient/family expressed understanding of return precautions and need for follow-up. Patient spoken to regarding all imaging and laboratory results and appropriate follow up for these results. All education provided in verbal form with additional information in written form. Time was allowed for answering of patient questions. Patient discharged.    Emergency Department Medication  Summary:   Medications  acetaminophen (TYLENOL) tablet 1,000 mg (has no administration in time range)  ibuprofen (ADVIL) tablet 600 mg (has no administration in time range)  oxyCODONE (Oxy IR/ROXICODONE) immediate release tablet 5 mg (has no administration in time range)  oxyCODONE-acetaminophen (PERCOCET/ROXICET) 5-325 MG per tablet 2 tablet (2 tablets Oral Given 09/27/22 1849)  ondansetron (ZOFRAN-ODT) disintegrating tablet 4 mg (4 mg Oral Given 09/27/22 1850)         Clinical Impression:  1. Fall, initial encounter   2. Closed fracture of left wrist, initial encounter      Discharge   Final Clinical Impression(s) / ED Diagnoses Final diagnoses:  Fall, initial encounter  Closed fracture of left wrist, initial encounter    Rx / DC Orders ED Discharge Orders          Ordered    oxyCODONE (ROXICODONE) 5 MG immediate release tablet  Every 6 hours PRN        09/27/22 2041              Glyn Ade, MD 09/27/22 2052

## 2022-09-27 NOTE — ED Notes (Signed)
Discharge instructions reviewed with pt/ family.   Written prescription verified with pt. As discussed with MD. Electronic transfer of prescription not responding. Call back if assistance needed.   Opportunity for questions and concerns provided.   Alert, oriented, and ambulatory on departure.

## 2022-10-02 ENCOUNTER — Telehealth: Payer: Self-pay

## 2022-10-02 NOTE — Telephone Encounter (Signed)
Transition Care Management Unsuccessful Follow-up Telephone Call  Date of discharge and from where:  09/27/2022 [facility]  Attempts:  1st Attempt  Reason for unsuccessful TCM follow-up call:  Left voice message  [name] [facility]  [facility] Population Health Community Resource Care Guide   [email or web address]  ?? [phone]   Website: [email or web address]  [email or web address]

## 2022-10-02 NOTE — Telephone Encounter (Signed)
Transition Care Management Follow-up Telephone Call Date of discharge and from where: 09/27/2022 [facility] How have you been since you were released from the hospital? Patient is feeling better, but still sore. Any questions or concerns? No  Items Reviewed: Did the pt receive and understand the discharge instructions provided? Yes  Medications obtained and verified? Yes  Other? No  Any new allergies since your discharge? No  Dietary orders reviewed? Yes Do you have support at home? Yes   Follow up appointments reviewed:  PCP Hospital f/u appt confirmed? Yes  Scheduled to see Adela Glimpse, NP on 10/09/2022 @ [facility]. Specialist Hospital f/u appt confirmed?  Patient stated she has an appointment with an Orthopedic Surgeon 10/03/2022  Scheduled to see  on  @ . Are transportation arrangements needed? No  If their condition worsens, is the pt aware to call PCP or go to the Emergency Dept.? Yes Was the patient provided with contact information for the PCP's office or ED? Yes Was to pt encouraged to call back with questions or concerns? Yes  [facility]  [facility] Population Health Community Resource Care Guide   [email or web address]  ?? [phone]   Website: [email or web address]  [email or web address]

## 2022-10-03 DIAGNOSIS — S52512A Displaced fracture of left radial styloid process, initial encounter for closed fracture: Secondary | ICD-10-CM | POA: Diagnosis not present

## 2022-10-06 ENCOUNTER — Other Ambulatory Visit: Payer: Self-pay | Admitting: Internal Medicine

## 2022-10-06 DIAGNOSIS — E669 Obesity, unspecified: Secondary | ICD-10-CM

## 2022-10-06 MED ORDER — PHENTERMINE HCL 37.5 MG PO TABS
ORAL_TABLET | ORAL | 0 refills | Status: DC
Start: 2022-10-06 — End: 2023-04-25

## 2022-10-08 ENCOUNTER — Other Ambulatory Visit: Payer: Self-pay | Admitting: Nurse Practitioner

## 2022-10-08 DIAGNOSIS — F419 Anxiety disorder, unspecified: Secondary | ICD-10-CM

## 2022-10-09 ENCOUNTER — Ambulatory Visit: Payer: Medicare HMO | Admitting: Nurse Practitioner

## 2022-10-11 DIAGNOSIS — G8918 Other acute postprocedural pain: Secondary | ICD-10-CM | POA: Diagnosis not present

## 2022-10-11 DIAGNOSIS — Y999 Unspecified external cause status: Secondary | ICD-10-CM | POA: Diagnosis not present

## 2022-10-11 DIAGNOSIS — X58XXXA Exposure to other specified factors, initial encounter: Secondary | ICD-10-CM | POA: Diagnosis not present

## 2022-10-11 DIAGNOSIS — S52572A Other intraarticular fracture of lower end of left radius, initial encounter for closed fracture: Secondary | ICD-10-CM | POA: Diagnosis not present

## 2022-10-13 ENCOUNTER — Other Ambulatory Visit: Payer: Self-pay | Admitting: Podiatry

## 2022-10-16 ENCOUNTER — Telehealth: Payer: Self-pay

## 2022-10-16 NOTE — Telephone Encounter (Signed)
Patient called and left a message. She has increased her gabapentin to 2 per day instead of just one 100mg  - the pharmacy will not refill until September - she needs a new Rx sent to her pharmacy. [facility] [place] [place] Thanks

## 2022-10-17 ENCOUNTER — Other Ambulatory Visit: Payer: Self-pay | Admitting: Podiatry

## 2022-10-17 DIAGNOSIS — G629 Polyneuropathy, unspecified: Secondary | ICD-10-CM

## 2022-10-17 MED ORDER — GABAPENTIN 100 MG PO CAPS: 100.0000 mg | ORAL_CAPSULE | Freq: Two times a day (BID) | ORAL | 0 refills | Status: DC

## 2022-10-17 NOTE — Progress Notes (Deleted)
Office Visit Note  Patient: Teresa Vasquez             Date of Birth: [birth year]           MRN: [id number]             PCP: Lucky Cowboy, MD Referring: Lucky Cowboy, MD Visit Date: 10/30/2022 Occupation: @GUAROCC @  Subjective:  No chief complaint on file.   History of Present Illness: Teresa Vasquez is a 72 y.o. female ***     Activities of Daily Living:  Patient reports morning stiffness for *** {minute/hour:19697}.   Patient {ACTIONS;DENIES/REPORTS:21021675::"Denies"} nocturnal pain.  Difficulty dressing/grooming: {ACTIONS;DENIES/REPORTS:21021675::"Denies"} Difficulty climbing stairs: {ACTIONS;DENIES/REPORTS:21021675::"Denies"} Difficulty getting out of chair: {ACTIONS;DENIES/REPORTS:21021675::"Denies"} Difficulty using hands for taps, buttons, cutlery, and/or writing: {ACTIONS;DENIES/REPORTS:21021675::"Denies"}  No Rheumatology ROS completed.   PMFS History:  Patient Active Problem List   Diagnosis Date Noted   Positive ANA (antinuclear antibody) 08/30/2021   Osteopenia 01/05/2021   Iron deficiency anemia 09/01/2020   Insomnia due to psychological stress 04/20/2020   Plantar fasciitis of left foot 06/16/2018   GERD (gastroesophageal reflux disease) 03/06/2018   Obesity (BMI 30.0-34.9) 08/27/2017   Allergic rhinitis 03/14/2013   Vitamin D deficiency 03/14/2013   Elevated blood pressure reading without diagnosis of hypertension 03/14/2013   Other abnormal glucose (prediabetes) 03/14/2013   Mixed hyperlipidemia 03/14/2013   Anxiety 04/13/2009   Major depressive disorder in partial remission (HCC) 04/13/2009   IRRITABLE BOWEL SYNDROME 04/13/2009    Past Medical History:  Diagnosis Date   Anxiety and depression    Cholecystitis, acute with cholelithiasis 12/17/2013   Depression    Diverticulitis    DIVERTICULITIS 04/19/2009   Qualifier: Diagnosis of  By: Arlyce Dice MD, Barbette Hair    Hyperlipidemia    Hypertension    Irritable bowel syndrome     Family  History  Problem Relation Age of Onset   Heart disease Mother    Hypertension Mother    Osteoporosis Mother    Heart disease Father    Heart attack Father 78   Irritable bowel syndrome Sister    Brain cancer Sister 20   Heart attack Brother 55   Heart disease Brother    Throat cancer Brother        remote smoker   Cancer Paternal Aunt        oral   Healthy Son    Past Surgical History:  Procedure Laterality Date   CHOLECYSTECTOMY N/A 12/17/2013   Procedure: LAPAROSCOPIC CHOLECYSTECTOMY WITH INTRAOPERATIVE CHOLANGIOGRAM;  Surgeon: Claud Kelp, MD;  Location: [facility] OR;  Service: General;  Laterality: N/A;   COLON SURGERY  2012   Diverticulosis   KNEE ARTHROSCOPY Right 2019   x 2   TONSILLECTOMY     VAGINAL HYSTERECTOMY     Social History   Social History Narrative   Daily caffiene use.  Coke or tea 2-3 daily   Education: HS grad   Work: retired (worked Publishing rights manager).    Poll work.    Immunization History  Administered Date(s) Administered   Influenza, High Dose Seasonal PF 04/10/2018   PFIZER(Purple Top)SARS-COV-2 Vaccination 07/06/2019, 07/27/2019   Pneumococcal Conjugate-13 04/21/2019   Pneumococcal Polysaccharide-23 04/20/2020   Tdap 08/27/2012     Objective: Vital Signs: There were no vitals taken for this visit.   Physical Exam   Musculoskeletal Exam: ***  CDAI Exam: CDAI Score: -- Patient Global: --; Provider Global: -- Swollen: --; Tender: -- Joint Exam 10/30/2022   No joint exam has been documented for  this visit   There is currently no information documented on the homunculus. Go to the Rheumatology activity and complete the homunculus joint exam.  Investigation: No additional findings.  Imaging: CT Maxillofacial Wo Contrast  Result Date: 09/27/2022 CLINICAL DATA:  Facial trauma EXAM: CT MAXILLOFACIAL WITHOUT CONTRAST TECHNIQUE: Multidetector CT imaging of the maxillofacial structures was performed. Multiplanar CT image reconstructions  were also generated. RADIATION DOSE REDUCTION: This exam was performed according to the departmental dose-optimization program which includes automated exposure control, adjustment of the mA and/or kV according to patient size and/or use of iterative reconstruction technique. COMPARISON:  None Available. FINDINGS: Osseous: Mastoid air cells are clear. Mandibular heads are normally position. No mandibular fracture. Pterygoid plates and zygomatic arches appear intact. No acute nasal bone fracture. Orbits: Possible remote fracture left orbital floor. No acute orbital fracture is seen. Previous lens extraction. Sinuses: No fluid levels.  No sinus wall fracture Soft tissues: Negative. Limited intracranial: No significant or unexpected finding. IMPRESSION: No CT evidence for acute facial bone fracture. Electronically Signed   By: Jasmine Pang M.D.   On: 09/27/2022 19:33   DG Wrist Complete Left  Result Date: 09/27/2022 CLINICAL DATA:  Larey Seat, medial left wrist pain EXAM: LEFT WRIST - COMPLETE 3+ VIEW COMPARISON:  None Available. FINDINGS: Frontal, oblique, lateral views of the left wrist are obtained. Overlying splint obscures underlying bony detail. There is a comminuted minimally displaced intra-articular fracture through the radial styloid, with slight impaction and volar angulation. The radiocarpal joint remains intact. No other acute bony abnormalities. Osteoarthritis at the first carpometacarpal joint. IMPRESSION: 1. Overlying splint material obscures underlying bony detail. 2. Comminuted minimally displaced intra-articular fracture through the radial styloid, with mild volar angulation and impaction. 3. Osteoarthritis of the base of the thumb. Electronically Signed   By: Sharlet Salina M.D.   On: 09/27/2022 19:09    Recent Labs: Lab Results  Component Value Date   WBC 4.8 06/19/2022   HGB 13.4 06/19/2022   PLT 193 06/19/2022   NA 143 06/19/2022   K 4.5 06/19/2022   CL 107 06/19/2022   CO2 29  06/19/2022   GLUCOSE 96 06/19/2022   BUN 12 06/19/2022   CREATININE 0.79 06/19/2022   BILITOT 0.4 06/19/2022   ALKPHOS 100 08/01/2016   AST 14 06/19/2022   ALT 6 06/19/2022   PROT 6.8 06/19/2022   ALBUMIN 4.3 08/01/2016   CALCIUM 9.8 06/19/2022   GFRAA 88 08/30/2020    Speciality Comments: No specialty comments available.  Procedures:  No procedures performed Allergies: Atorvastatin, Levofloxacin, and Prednisone   Assessment / Plan:     Visit Diagnoses: No diagnosis found.  Orders: No orders of the defined types were placed in this encounter.  No orders of the defined types were placed in this encounter.   Face-to-face time spent with patient was *** minutes. Greater than 50% of time was spent in counseling and coordination of care.  Follow-Up Instructions: No follow-ups on file.   Ellen Henri, CMA  Note - This record has been created using Animal nutritionist.  Chart creation errors have been sought, but may not always  have been located. Such creation errors do not reflect on  the standard of medical care.

## 2022-10-17 NOTE — Progress Notes (Signed)
Patient called.  She needs a Rx clarification with the pharmacy on her gabapentin.  She needs a new Rx to say 100mg  twice daily instead of once daily.    Confirmed the change with Dr. Ardelle Anton and sent new Rx over with notes to pharmacist that this was a clarification of the previous Rx sent in.  Patient is leaving to go out of town and needed it sent in immediately.

## 2022-10-18 ENCOUNTER — Ambulatory Visit: Payer: Medicare HMO | Admitting: Podiatry

## 2022-10-20 NOTE — Progress Notes (Unsigned)
ANNUAL WELLNESS VISIT AND FOLLOW UP  Assessment and Plan:  Annual Medicare Wellness Visit Due annually  Health maintenance reviewed Healthily lifestyle goals set  Allergic rhinitis due to pollen, unspecified seasonality Continue allergy medication Avoid triggers  Irritable bowel syndrome, unspecified type Suggest daily probiotic Stay well hydrated and remain active. Avoid stressors Continue to monitor  Elevated blood pressure reading without diagnosis of hypertension Discussed DASH (Dietary Approaches to Stop Hypertension) DASH diet is lower in sodium than a typical American diet. Cut back on foods that are high in saturated fat, cholesterol, and trans fats. Eat more whole-grain foods, fish, poultry, and nuts Remain active and exercise as tolerated daily.  Monitor BP at home-Call if greater than 130/80.  Check CMP/CBC  Other abnormal glucose Last check at goal. Education: Reviewed 'ABCs' of diabetes management  Discussed goals to be met and/or maintained include A1C (<7) Blood pressure (<130/80) Cholesterol (LDL <70) Continue Eye Exam yearly  Continue Dental Exam Q6 mo Discussed dietary recommendations Discussed Physical Activity recommendations Check A1C  Mixed hyperlipidemia Discussed lifestyle modifications. Recommended diet heavy in fruits and veggies, omega 3's. Decrease consumption of animal meats, cheeses, and dairy products. Remain active and exercise as tolerated. Continue to monitor. Check lipids/TSH  Vitamin D deficiency Continue supplement for goal of 60-100 Monitor Vitamin D levels  Obesity- BMI 30 Discussed appropriate BMI Diet modification. Physical activity. Encouraged/praised to build confidence.  Medication management All medications discussed and reviewed in full. All questions and concerns regarding medications addressed.     Estrogen def/Osteopenia Pursue a combination of weight-bearing exercises and strength training. Patients  with severe mobility impairment should be referred for physical therapy. Advised on fall prevention measures including proper lighting in all rooms, removal of area rugs and floor clutter, use of walking devices as deemed appropriate, avoidance of uneven walking surfaces. Smoking cessation and moderate alcohol consumption if applicable Consume 800 to 1000 IU of vitamin D daily with a goal vitamin D serum value of 30 ng/mL or higher. Aim for 1000 to 1200 mg of elemental calcium daily through supplements and/or dietary sources.  Recurrent depression in partial remission /Anxiety state/ insomnia  Continue Buspar and start Trazodone.   Reviewed relaxation techniques.  Sleep hygiene. Recommended Cognitive Behavioral Therapy (CBT) PRN. Recommended mindfulness meditation and exercise.   Insight-oriented psychotherapy given for 16 minutes exclusively. Psychoeducation:  encouraged personality growth wand development through coping techniques and problem-solving skills. Limit/Decrease/Monitor drug/alcohol intake.   Discussed good sleep hygiene. Establish bed and wake times. Sleep restriction-only sleep estimated hrs sleep. Bed only for sex and sleep, only sleep when sleepy, out of bed if anxious (stimulus control). Reviewed relaxation techniques, mindful meditations. Expected sleep duration. Addressed worries about not sleeping.   Orders Placed This Encounter  Procedures   CBC with Differential/Platelet   COMPLETE METABOLIC PANEL WITH GFR   Lipid panel   Hemoglobin A1c   Outpatient Encounter Medications as of 10/21/2022  Medication Sig   albuterol (VENTOLIN HFA) 108 (90 Base) MCG/ACT inhaler TAKE 2 PUFFS BY MOUTH EVERY 6 HOURS AS NEEDED FOR WHEEZE OR SHORTNESS OF BREATH   aspirin 81 MG tablet Take 81 mg by mouth daily.    busPIRone (BUSPAR) 5 MG tablet TAKE 1 TABLET BY MOUTH TWICE A DAY   Cholecalciferol (VITAMIN D3) 125 MCG (5000 UT) CAPS Takes 2 capsules (10,000 units )  Daily   CINNAMON PO  Take 1,200 mg by mouth.   ELDERBERRY PO Take by mouth. Calcium and vit c in this supplement  Ferrous Sulfate (IRON PO) Take 18 mg by mouth.   fluticasone (FLONASE) 50 MCG/ACT nasal spray USE 2 SPRAYS TO EACH NOSTRIL DAILY   gabapentin (NEURONTIN) 100 MG capsule Take 1 capsule (100 mg total) by mouth 2 (two) times daily.   Ibuprofen-diphenhydrAMINE Cit (ADVIL PM PO) Take by mouth.   montelukast (SINGULAIR) 10 MG tablet TAKE 1 TABLET BY MOUTH DAILY FOR ALLERGIES   omeprazole (PRILOSEC) 40 MG capsule Take  1 capsule  Daily to Prevent Heartburn & Indigestion                                 /                              TAKE                        BY                     MOUTH                        ONCE DAILY   oxyCODONE (ROXICODONE) 5 MG immediate release tablet Take 1 tablet (5 mg total) by mouth every 6 (six) hours as needed for severe pain.   phentermine (ADIPEX-P) 37.5 MG tablet Take  1/2 to 1 tablet  every Morning  for Dieting & Weight Loss   rosuvastatin (CRESTOR) 5 MG tablet TAKE 1 TAB DAILY FOR CHOLESTEROL.   traZODone (DESYREL) 50 MG tablet Take 1 tablet (50 mg total) by mouth at bedtime.   Zinc 50 MG TABS Take by mouth.   No facility-administered encounter medications on file as of 10/21/2022.    Notify office for further evaluation and treatment, questions or concerns if any reported s/s fail to improve.   The patient was advised to call back or seek an in-person evaluation if any symptoms worsen or if the condition fails to improve as anticipated.   Further disposition pending results of labs. Discussed med's effects and SE's.    I discussed the assessment and treatment plan with the patient. The patient was provided an opportunity to ask questions and all were answered. The patient agreed with the plan and demonstrated an understanding of the instructions.  Discussed med's effects and SE's. Screening labs and tests as requested with regular follow-up as recommended.  I provided  40 minutes of face-to-face time during this encounter including counseling, chart review, and critical decision making was preformed.  Today's Plan of Care is based on a patient-centered health care approach known as shared decision making - the decisions, tests and treatments allow for patient preferences and values to be balanced with clinical evidence.    Future Appointments  Date Time Provider Department Center  10/30/2022  1:00 PM Pollyann Savoy, MD [facility] None  11/08/2022 11:45 AM Vivi Barrack, DPM [facility]  06/19/2023  2:00 PM Adela Glimpse, NP [facility] None  10/21/2023 11:30 AM Adela Glimpse, NP [facility] None    Plan:   During the course of the visit the patient was educated and counseled about appropriate screening and preventive services including:   Pneumococcal vaccine  Prevnar 13 Influenza vaccine Td vaccine Screening electrocardiogram Bone densitometry screening Colorectal cancer screening Diabetes screening Glaucoma screening Nutrition counseling  Advanced directives: requested    HPI  72 y.o. female  presents for an AWV and follow up. She has Anxiety; Major depressive disorder in partial remission (HCC); IRRITABLE BOWEL SYNDROME; Allergic rhinitis; Vitamin D deficiency; Elevated blood pressure reading without diagnosis of hypertension; Other abnormal glucose (prediabetes); Mixed hyperlipidemia; Obesity (BMI 30.0-34.9); GERD (gastroesophageal reflux disease); Plantar fasciitis of left foot; Insomnia due to psychological stress; Iron deficiency anemia; Osteopenia; and Positive ANA (antinuclear antibody) on their problem list.   She is married, she has 1 child and 2 grandkids, watches occasionally. She is retired but still helps with polling in [employer]   She experienced a mechanical fall 09/27/22 while at [facility].  Was walking with her 2 grand-kids and was distracted when she tripped and fell.  She had a left fall with outstretched hand  and hit her face on the asphalt.  She had a comminuted left wrist fracture that was relatively well-approximated.  She was referred to Orthopedics, follows with [facility].  She is currently in a hard cast with ace bandage.  She has a f/u apt in 4 days.  During the fall she also busted her lip and knocked out two front right teeth.  She is set to have a bridge placement in the coming weeks.    She has noticed increase in anxiety and frustration.  Hx of MDD/anxiety in remission, off of lexapro, She was once on Xanax and now tapered off.  Did try Buspar low dose which she felt was not effective.  She is continuing to experience some nights where she doesn't feel she sleeps at all.   She is following with Dr. Tenny Craw for GYN every 2 years, saw 02/01/2021 and had mammogram and pelvic, has upcoming this year.  Has hysterectomy sparing ovaries in 1992.   IBS doing better since her weight loss efforts.   Recently saw podiatry Dr. Ardelle Anton for foot pain, had sig + ANA 1:1280 in 06/19/2021 and was referred to rheum Dr. Corliss Skains.  BMI is Body mass index is 30.73 kg/m., she has been working on diet and exercise, down 12 lb in the past but after the fall has been eating more sweets.  She has been prescribed phentermine with perceived benefit when dietary modifications are well controlled.  Wt Readings from Last 3 Encounters:  10/21/22 190 lb 6.4 oz (86.4 kg)  09/27/22 188 lb 15 oz (85.7 kg)  06/19/22 189 lb (85.7 kg)   Her blood pressure has been controlled at home, today their BP is BP: (!) 140/70.   BP Readings from Last 3 Encounters:  10/21/22 (!) 140/70  09/27/22 (!) 149/69  06/19/22 120/70   She does not workout. She denies chest pain, shortness of breath, dizziness.   She is on cholesterol medication (rosuvastatin 5 mg daily, has increased from 3 days a week, hx of myalgia with atorvastatin) and denies myalgias. Her cholesterol is not at goal of LDL <100. The cholesterol last visit was:  Lab  Results  Component Value Date   CHOL 280 (H) 06/19/2022   HDL 41 (L) 06/19/2022   LDLCALC 204 (H) 06/19/2022   TRIG 179 (H) 06/19/2022   CHOLHDL 6.8 (H) 06/19/2022  . She hasn't been working on diet and exercise for hx of prediabetes (5.7% in 2015, 5.8% in 03/2018), she is on bASA, she is not on ACE/ARB and denies foot ulcerations, hyperglycemia, hypoglycemia , increased appetite, nausea, paresthesia of the feet, polydipsia, polyuria, visual disturbances, vomiting and weight loss. Last A1C in the office was:  Lab Results  Component  Value Date   HGBA1C 5.6 06/19/2022    Last GFR:  Lab Results  Component Value Date   EGFR 81 06/19/2022   Patient is on Vitamin D supplement, taking 16109 IU daily    Lab Results  Component Value Date   VD25OH 60 06/19/2022     Last GFR:  Lab Results  Component Value Date   GFRNONAA 76 08/30/2020   She has been on an iron supplement for mild deficiency, no anemia -  Lab Results  Component Value Date   IRON 94 08/30/2021   TIBC 279 08/30/2021   FERRITIN 29 08/30/2021     Current Medications:  Current Outpatient Medications on File Prior to Visit  Medication Sig Dispense Refill   albuterol (VENTOLIN HFA) 108 (90 Base) MCG/ACT inhaler TAKE 2 PUFFS BY MOUTH EVERY 6 HOURS AS NEEDED FOR WHEEZE OR SHORTNESS OF BREATH 8.5 each 3   aspirin 81 MG tablet Take 81 mg by mouth daily.      busPIRone (BUSPAR) 5 MG tablet TAKE 1 TABLET BY MOUTH TWICE A DAY 60 tablet 3   Cholecalciferol (VITAMIN D3) 125 MCG (5000 UT) CAPS Takes 2 capsules (10,000 units )  Daily     CINNAMON PO Take 1,200 mg by mouth.     ELDERBERRY PO Take by mouth. Calcium and vit c in this supplement     Ferrous Sulfate (IRON PO) Take 18 mg by mouth.     fluticasone (FLONASE) 50 MCG/ACT nasal spray USE 2 SPRAYS TO EACH NOSTRIL DAILY 48 mL 3   gabapentin (NEURONTIN) 100 MG capsule Take 1 capsule (100 mg total) by mouth 2 (two) times daily. 180 capsule 0   Ibuprofen-diphenhydrAMINE Cit (ADVIL  PM PO) Take by mouth.     montelukast (SINGULAIR) 10 MG tablet TAKE 1 TABLET BY MOUTH DAILY FOR ALLERGIES 90 tablet 3   omeprazole (PRILOSEC) 40 MG capsule Take  1 capsule  Daily to Prevent Heartburn & Indigestion                                 /                              TAKE                        BY                     MOUTH                        ONCE DAILY 90 capsule 3   oxyCODONE (ROXICODONE) 5 MG immediate release tablet Take 1 tablet (5 mg total) by mouth every 6 (six) hours as needed for severe pain. 15 tablet 0   phentermine (ADIPEX-P) 37.5 MG tablet Take  1/2 to 1 tablet  every Morning  for Dieting & Weight Loss 90 tablet 0   rosuvastatin (CRESTOR) 5 MG tablet TAKE 1 TAB DAILY FOR CHOLESTEROL. 90 tablet 1   Zinc 50 MG TABS Take by mouth.     No current facility-administered medications on file prior to visit.    Health Maintenance:   Immunization History  Administered Date(s) Administered   Influenza, High Dose Seasonal PF 04/10/2018   PFIZER(Purple Top)SARS-COV-2 Vaccination 07/06/2019, 07/27/2019   Pneumococcal Conjugate-13 04/21/2019   Pneumococcal  Polysaccharide-23 04/20/2020   Tdap 08/27/2012   Health Maintenance  Topic Date Due   Zoster Vaccines- Shingrix (1 of 2) Never done   COVID-19 Vaccine (3 - Pfizer risk series) 08/24/2019   MAMMOGRAM  02/14/2022   DTaP/Tdap/Td (2 - Td or Tdap) 08/28/2022   Medicare Annual Wellness (AWV)  10/21/2023   Fecal DNA (Cologuard)  08/03/2025   Pneumonia Vaccine 73+ Years old  Completed   DEXA SCAN  Completed   Hepatitis C Screening  Completed   HPV VACCINES  Aged Out   INFLUENZA VACCINE  Discontinued   Colonoscopy  Discontinued    Shingrix: ask insurance   Pap: Hysterectomy 2012, sees GYN every other year, last pelvic 02/01/2021 MGM: 02/2022 due 02/2023 DEXA: 01/04/22 osteopenia    Colonoscopy: 2011 Cologuard: 07/20/2019 neg  Last eye exam: [facility] 07/2022, no issues, uses [employer] readers Last dental exam: Dr.  Irving Copas, last visit 09/2022  Patient Care Team: Lucky Cowboy, MD as PCP - General (Internal Medicine) Drema Halon, MD (Inactive) as Consulting Physician (Otolaryngology) Nahser, Deloris Ping, MD as Consulting Physician (Cardiology) Louis Meckel, MD (Inactive) as Consulting Physician (Gastroenterology) Miguel Aschoff, MD (Inactive) as Consulting Physician (Obstetrics and Gynecology)  Medical History:  Past Medical History:  Diagnosis Date   Anxiety and depression    Cholecystitis, acute with cholelithiasis 12/17/2013   Depression    Diverticulitis    DIVERTICULITIS 04/19/2009   Qualifier: Diagnosis of  By: Arlyce Dice MD, Barbette Hair    Hyperlipidemia    Hypertension    Irritable bowel syndrome    Allergies Allergies  Allergen Reactions   Atorvastatin Other (See Comments)    Leg cramps   Levofloxacin    Prednisone     SURGICAL HISTORY She  has a past surgical history that includes Tonsillectomy; Vaginal hysterectomy; Colon surgery (2012); Cholecystectomy (N/A, 12/17/2013); and Knee arthroscopy (Right, 2019). FAMILY HISTORY Her family history includes Brain cancer (age of onset: 39) in her sister; Cancer in her paternal aunt; Healthy in her son; Heart attack (age of onset: 91) in her brother; Heart attack (age of onset: 35) in her father; Heart disease in her brother, father, and mother; Hypertension in her mother; Irritable bowel syndrome in her sister; Osteoporosis in her mother; Throat cancer in her brother. SOCIAL HISTORY She  reports that she has never smoked. She has never been exposed to tobacco smoke. She has never used smokeless tobacco. She reports that she does not drink alcohol and does not use drugs.  MEDICARE WELLNESS OBJECTIVES: Physical activity: Current Exercise Habits: Home exercise routine Cardiac risk factors:   Depression/mood screen:      10/21/2022   12:36 PM  Depression screen PHQ 2/9  Decreased Interest 0  Down, Depressed, Hopeless 1  PHQ - 2 Score  1    ADLs:     10/21/2022   12:36 PM  In your present state of health, do you have any difficulty performing the following activities:  Hearing? 0  Vision? 0  Difficulty concentrating or making decisions? 0  Walking or climbing stairs? 0  Dressing or bathing? 0  Doing errands, shopping? 0     Cognitive Testing  Alert? Yes  Normal Appearance?Yes  Oriented to person? Yes  Place? Yes   Time? Yes  Recall of three objects?  Yes  Can perform simple calculations? Yes  Displays appropriate judgment?Yes  Can read the correct time from a watch face?Yes  EOL planning:       Review of  Systems: Review of Systems  Constitutional:  Negative for chills, fever and malaise/fatigue.  HENT:  Negative for congestion, ear pain, hearing loss, sinus pain, sore throat and tinnitus.   Eyes: Negative.  Negative for blurred vision and double vision.  Respiratory:  Negative for cough, hemoptysis, sputum production, shortness of breath and wheezing.   Cardiovascular:  Negative for chest pain, palpitations and leg swelling.  Gastrointestinal:  Positive for constipation (ibs) and diarrhea (ibs). Negative for abdominal pain, blood in stool, heartburn, melena, nausea and vomiting.  Genitourinary: Negative.  Negative for dysuria and urgency.  Musculoskeletal:  Negative for back pain, falls, joint pain, myalgias and neck pain.  Skin: Negative.  Negative for rash.  Neurological:  Negative for dizziness, tingling, tremors, sensory change, loss of consciousness, weakness and headaches.  Endo/Heme/Allergies:  Does not bruise/bleed easily.  Psychiatric/Behavioral:  Positive for depression. Negative for substance abuse and suicidal ideas. The patient has insomnia. The patient is not nervous/anxious.     Physical Exam: Estimated body mass index is 30.73 kg/m as calculated from the following:   Height as of this encounter: 5\' 6"  (1.676 m).   Weight as of this encounter: 190 lb 6.4 oz (86.4 kg). BP (!) 140/70    Pulse 75   Temp 97.9 F (36.6 C)   Resp 16   Ht 5\' 6"  (1.676 m)   Wt 190 lb 6.4 oz (86.4 kg)   SpO2 98%   BMI 30.73 kg/m   General Appearance: Well nourished well developed, in no apparent distress.  Eyes: PERRLA, EOMs, conjunctiva no swelling or erythema ENT/Mouth: Ear canals normal without obstruction, swelling, erythema, or discharge.  TMs normal bilaterally with no erythema, bulging, retraction, or loss of landmark.  Oropharynx moist and clear with no exudate, erythema, or swelling.   Neck: Supple, thyroid normal. No bruits.  No cervical adenopathy Respiratory: Respiratory effort normal, Breath sounds clear A&P without wheeze, rhonchi, rales.   Cardio: RRR without murmurs, rubs or gallops. Brisk peripheral pulses without edema.  Chest: symmetric, with normal excursions Abdomen: Soft, nontender, no guarding, rebound, hernias, masses, or organomegaly.  Lymphatics: Non tender without lymphadenopathy.  Musculoskeletal: Full ROM all peripheral extremities,5/5 strength, and normal gait.  Skin: Warm, dry without rashes, lesions, ecchymosis. Neuro: Awake and oriented X 3, Cranial nerves intact, reflexes equal bilaterally. Normal muscle tone, no cerebellar symptoms. Sensation intact.  Psych:  normal affect, Insight and Judgment appropriate.    Medicare Attestation I have personally reviewed: The patient's medical and social history Their use of alcohol, tobacco or illicit drugs Their current medications and supplements The patient's functional ability including ADLs,fall risks, home safety risks, cognitive, and hearing and visual impairment Diet and physical activities Evidence for depression or mood disorders  The patient's weight, height, BMI, and visual acuity have been recorded in the chart.  I have made referrals, counseling, and provided education to the patient based on review of the above and I have provided the patient with a written personalized care plan for preventive  services.     Over 40 minutes of exam, counseling, chart review and critical decision making was performed  Adela Glimpse, NP-C 12:36 PM [facility]

## 2022-10-21 ENCOUNTER — Encounter: Payer: Self-pay | Admitting: Nurse Practitioner

## 2022-10-21 ENCOUNTER — Ambulatory Visit (INDEPENDENT_AMBULATORY_CARE_PROVIDER_SITE_OTHER): Payer: Medicare HMO | Admitting: Nurse Practitioner

## 2022-10-21 VITALS — BP 140/70 | HR 75 | Temp 97.9°F | Resp 16 | Ht 66.0 in | Wt 190.4 lb

## 2022-10-21 DIAGNOSIS — F5102 Adjustment insomnia: Secondary | ICD-10-CM | POA: Diagnosis not present

## 2022-10-21 DIAGNOSIS — J301 Allergic rhinitis due to pollen: Secondary | ICD-10-CM

## 2022-10-21 DIAGNOSIS — R030 Elevated blood-pressure reading, without diagnosis of hypertension: Secondary | ICD-10-CM

## 2022-10-21 DIAGNOSIS — M8589 Other specified disorders of bone density and structure, multiple sites: Secondary | ICD-10-CM | POA: Diagnosis not present

## 2022-10-21 DIAGNOSIS — E782 Mixed hyperlipidemia: Secondary | ICD-10-CM | POA: Diagnosis not present

## 2022-10-21 DIAGNOSIS — R6889 Other general symptoms and signs: Secondary | ICD-10-CM

## 2022-10-21 DIAGNOSIS — Z0001 Encounter for general adult medical examination with abnormal findings: Secondary | ICD-10-CM | POA: Diagnosis not present

## 2022-10-21 DIAGNOSIS — K589 Irritable bowel syndrome without diarrhea: Secondary | ICD-10-CM

## 2022-10-21 DIAGNOSIS — R7309 Other abnormal glucose: Secondary | ICD-10-CM | POA: Diagnosis not present

## 2022-10-21 DIAGNOSIS — E669 Obesity, unspecified: Secondary | ICD-10-CM

## 2022-10-21 DIAGNOSIS — E559 Vitamin D deficiency, unspecified: Secondary | ICD-10-CM

## 2022-10-21 DIAGNOSIS — Z79899 Other long term (current) drug therapy: Secondary | ICD-10-CM | POA: Diagnosis not present

## 2022-10-21 DIAGNOSIS — F3341 Major depressive disorder, recurrent, in partial remission: Secondary | ICD-10-CM

## 2022-10-21 DIAGNOSIS — Z0000 Encounter for general adult medical examination without abnormal findings: Secondary | ICD-10-CM

## 2022-10-21 MED ORDER — TRAZODONE HCL 50 MG PO TABS
50.0000 mg | ORAL_TABLET | Freq: Every day | ORAL | 2 refills | Status: AC
Start: 2022-10-21 — End: ?

## 2022-10-21 NOTE — Patient Instructions (Signed)
Trazodone Tablets What is this medication? TRAZODONE (TRAZ oh done) treats depression. It increases the amount of serotonin in the brain, a hormone that helps regulate mood. This medicine may be used for other purposes; ask your health care provider or pharmacist if you have questions. COMMON BRAND NAME(S): Desyrel What should I tell my care team before I take this medication? They need to know if you have any of these conditions: Bipolar disorder Bleeding disorder Glaucoma Heart disease, or previous heart attack Irregular heartbeat or rhythm Kidney disease Liver disease Low levels of sodium in the blood Suicidal thoughts, plans, or attempt by you or a family member An unusual or allergic reaction to trazodone, other medications, foods, dyes, or preservatives Pregnant or trying to get pregnant Breastfeeding How should I use this medication? Take this medication by mouth with a glass of water. Take it as directed on the prescription label at the same time every day. Take this medication shortly after a meal or a light snack. Keep taking this medication unless your care team tells you to stop. Stopping it too quickly can cause serious side effects. It can also make your condition worse. A special MedGuide will be given to you by the pharmacist with each prescription and refill. Be sure to read this information carefully each time. Talk to your care team about the use of this medication in children. Special care may be needed. Overdosage: If you think you have taken too much of this medicine contact a poison control center or emergency room at once. NOTE: This medicine is only for you. Do not share this medicine with others. What if I miss a dose? If you miss a dose, take it as soon as you can. If it is almost time for your next dose, take only that dose. Do not take double or extra doses. What may interact with this medication? Do not take this medication with any of the following: Certain  medications for fungal infections, such as fluconazole, itraconazole, ketoconazole, posaconazole, voriconazole Cisapride Dronedarone Linezolid MAOIs, such as Carbex, Eldepryl, Marplan, Nardil, and Parnate Mesoridazine Methylene blue (injected into a vein) Pimozide Saquinavir Thioridazine This medication may also interact with the following: Alcohol Antiviral medications for HIV or AIDS Aspirin and aspirin-like medications Barbiturates, such as phenobarbital Certain medications for blood pressure, heart disease, irregular heart beat Certain medications for mental health conditions Certain medications for migraine headache, such as almotriptan, eletriptan, frovatriptan, naratriptan, rizatriptan, sumatriptan, zolmitriptan Certain medications for seizures, such as carbamazepine and phenytoin Certain medications for sleep Certain medications that treat or prevent blood clots, such as dalteparin, enoxaparin, warfarin Digoxin Fentanyl Lithium NSAIDS, medications for pain and inflammation, such as ibuprofen or naproxen Other medications that cause heart rhythm changes Rasagiline Supplements, such as St. John's wort, kava kava, valerian Tramadol Tryptophan This list may not describe all possible interactions. Give your health care provider a list of all the medicines, herbs, non-prescription drugs, or dietary supplements you use. Also tell them if you smoke, drink alcohol, or use illegal drugs. Some items may interact with your medicine. What should I watch for while using this medication? Visit your care team for regular checks on your progress. Tell your care team if your symptoms do not start to get better or if they get worse. Because it may take several weeks to see the full effects of this medication, it is important to continue your treatment as prescribed by your care team. Watch for new or worsening thoughts of suicide or depression. This   includes sudden changes in mood, behaviors,  or thoughts. These changes can happen at any time but are more common in the beginning of treatment or after a change in dose. Call your care team right away if you experience these thoughts or worsening depression. This medication may cause mood and behavior changes, such as anxiety, nervousness, irritability, hostility, restlessness, excitability, hyperactivity, or trouble sleeping. These changes can happen at any time but are more common in the beginning of treatment or after a change in dose. Call your care team right away if you notice any of these symptoms. This medication may affect your coordination, reaction time, or judgment. Do not drive or operate machinery until you know how this medication affects you. Sit up or stand slowly to reduce the risk of dizzy or fainting spells. Drinking alcohol with this medication can increase the risk of these side effects. This medication may cause dry eyes and blurred vision. If you wear contact lenses you may feel some discomfort. Lubricating drops may help. See your care team if the problem does not go away or is severe. Your mouth may get dry. Chewing sugarless gum or sucking hard candy and drinking plenty of water may help. Contact your care team if the problem does not go away or is severe. What side effects may I notice from receiving this medication? Side effects that you should report to your care team as soon as possible: Allergic reactions--skin rash, itching, hives, swelling of the face, lips, tongue, or throat Bleeding--bloody or Theroux, tar-like stools, red or dark brown urine, vomiting blood or brown material that looks like coffee grounds, small, red or purple spots on skin, unusual bleeding or bruising Heart rhythm changes--fast or irregular heartbeat, dizziness, feeling faint or lightheaded, chest pain, trouble breathing Low blood pressure--dizziness, feeling faint or lightheaded, blurry vision Low sodium level--muscle weakness, fatigue,  dizziness, headache, confusion Prolonged or painful erection Serotonin syndrome--irritability, confusion, fast or irregular heartbeat, muscle stiffness, twitching muscles, sweating, high fever, seizures, chills, vomiting, diarrhea Sudden eye pain or change in vision such as blurry vision, seeing halos around lights, vision loss Thoughts of suicide or self-harm, worsening mood, feelings of depression Side effects that usually do not require medical attention (report to your care team if they continue or are bothersome): Change in sex drive or performance Constipation Dizziness Drowsiness Dry mouth This list may not describe all possible side effects. Call your doctor for medical advice about side effects. You may report side effects to FDA at 1-800-FDA-1088. Where should I keep my medication? Keep out of the reach of children and pets. Store at room temperature between 15 and 30 degrees C (59 to 86 degrees F). Protect from light. Keep container tightly closed. Throw away any unused medication after the expiration date. NOTE: This sheet is a summary. It may not cover all possible information. If you have questions about this medicine, talk to your doctor, pharmacist, or health care provider.  2024 Elsevier/Gold Standard (2022-02-14 00:00:00)  

## 2022-10-22 LAB — CBC WITH DIFFERENTIAL/PLATELET
Absolute Monocytes: 501 {cells}/uL (ref 200–950)
Basophils Absolute: 32 cells/uL (ref 0–200)
Basophils Relative: 0.7 %
Eosinophils Absolute: 170 {cells}/uL (ref 15–500)
Eosinophils Relative: 3.7 %
HCT: 36.5 % (ref 35.0–45.0)
Hemoglobin: 12.1 g/dL (ref 11.7–15.5)
Lymphs Abs: 1053 {cells}/uL (ref 850–3900)
MCH: 30.2 pg (ref 27.0–33.0)
MCHC: 33.2 g/dL (ref 32.0–36.0)
MCV: 91 fL (ref 80.0–100.0)
MPV: 10.8 fL (ref 7.5–12.5)
Monocytes Relative: 10.9 %
Neutro Abs: 2843 {cells}/uL (ref 1500–7800)
Neutrophils Relative %: 61.8 %
Platelets: 192 10*3/uL (ref 140–400)
RBC: 4.01 10*6/uL (ref 3.80–5.10)
RDW: 12.6 % (ref 11.0–15.0)
Total Lymphocyte: 22.9 %
WBC: 4.6 10*3/uL (ref 3.8–10.8)

## 2022-10-22 LAB — COMPLETE METABOLIC PANEL WITH GFR
AG Ratio: 2 (calc) (ref 1.0–2.5)
ALT: 7 U/L (ref 6–29)
AST: 14 U/L (ref 10–35)
Albumin: 4.2 g/dL (ref 3.6–5.1)
Alkaline phosphatase (APISO): 94 U/L (ref 37–153)
BUN: 16 mg/dL (ref 7–25)
CO2: 26 mmol/L (ref 20–32)
Calcium: 9.4 mg/dL (ref 8.6–10.4)
Chloride: 109 mmol/L (ref 98–110)
Creat: 0.68 mg/dL (ref 0.50–1.05)
Globulin: 2.1 g/dL (calc) (ref 1.9–3.7)
Glucose, Bld: 91 mg/dL (ref 65–99)
Potassium: 4.3 mmol/L (ref 3.5–5.3)
Sodium: 142 mmol/L (ref 135–146)
Total Bilirubin: 0.4 mg/dL (ref 0.2–1.2)
Total Protein: 6.3 g/dL (ref 6.1–8.1)
eGFR: 94 mL/min/{1.73_m2} (ref >=60)

## 2022-10-22 LAB — HEMOGLOBIN A1C
Hgb A1c MFr Bld: 5.6 %{Hb} (ref <5.7)
Mean Plasma Glucose: 114 mg/dL
eAG (mmol/L): 6.3 mmol/L

## 2022-10-22 LAB — LIPID PANEL
Cholesterol: 255 mg/dL — ABNORMAL HIGH (ref <200)
HDL: 42 mg/dL — ABNORMAL LOW (ref >=50)
LDL Cholesterol (Calc): 176 mg/dL — ABNORMAL HIGH
Non-HDL Cholesterol (Calc): 213 mg/dL (calc) — ABNORMAL HIGH (ref <130)
Total CHOL/HDL Ratio: 6.1 (calc) — ABNORMAL HIGH (ref <5.0)
Triglycerides: 206 mg/dL — ABNORMAL HIGH (ref <150)

## 2022-10-25 DIAGNOSIS — S52502D Unspecified fracture of the lower end of left radius, subsequent encounter for closed fracture with routine healing: Secondary | ICD-10-CM | POA: Diagnosis not present

## 2022-10-29 DIAGNOSIS — M25532 Pain in left wrist: Secondary | ICD-10-CM | POA: Diagnosis not present

## 2022-10-30 ENCOUNTER — Ambulatory Visit: Payer: BC Managed Care – PPO | Admitting: Rheumatology

## 2022-10-30 DIAGNOSIS — M19041 Primary osteoarthritis, right hand: Secondary | ICD-10-CM

## 2022-10-30 DIAGNOSIS — R7309 Other abnormal glucose: Secondary | ICD-10-CM

## 2022-10-30 DIAGNOSIS — E782 Mixed hyperlipidemia: Secondary | ICD-10-CM

## 2022-10-30 DIAGNOSIS — E559 Vitamin D deficiency, unspecified: Secondary | ICD-10-CM

## 2022-10-30 DIAGNOSIS — R768 Other specified abnormal immunological findings in serum: Secondary | ICD-10-CM

## 2022-10-30 DIAGNOSIS — R202 Paresthesia of skin: Secondary | ICD-10-CM

## 2022-10-30 DIAGNOSIS — M8589 Other specified disorders of bone density and structure, multiple sites: Secondary | ICD-10-CM

## 2022-10-30 DIAGNOSIS — F32A Depression, unspecified: Secondary | ICD-10-CM

## 2022-10-30 DIAGNOSIS — D508 Other iron deficiency anemias: Secondary | ICD-10-CM

## 2022-10-30 DIAGNOSIS — M722 Plantar fascial fibromatosis: Secondary | ICD-10-CM

## 2022-10-30 DIAGNOSIS — Z8719 Personal history of other diseases of the digestive system: Secondary | ICD-10-CM

## 2022-10-30 DIAGNOSIS — F5102 Adjustment insomnia: Secondary | ICD-10-CM

## 2022-11-05 NOTE — Progress Notes (Unsigned)
Office Visit Note  Patient: Teresa Vasquez             Date of Birth: 1951/01/20           MRN: [id number]             PCP: Lucky Cowboy, MD Referring: Lucky Cowboy, MD Visit Date: 11/19/2022 Occupation: @GUAROCC @  Subjective:  Arthralgias   History of Present Illness: Teresa Vasquez is a 72 y.o. female with history of positive ANA and osteoarthritis.  Patient reports that she had a fall in July and they [employer] parking lot and landed on her left wrist.  She states that she fractured her left wrist requiring surgery by Dr. Orlan Leavens in August 2024.  She is currently going to physical therapy at [facility] and is wearing a compression brace on the left wrist.   Patient states that she has noticed some changes due to underlying osteoarthritis but denies any joint swelling at this time.  She remains under the care of Dr. Loreta Ave for chronic pain in both feet due to underlying osteoarthritis and neuropathy.  She is been experiencing some increased discomfort in her right knee joint.  She previously has had 2 arthroscopic surgeries performed as well as injections in the past.  She denies any clinical features of systemic lupus.  She is not having any sores in her mouth or nose.  She denies any symptoms of Raynaud's phenomenon.  She has not had any recent rashes or hair loss.  She denies any photosensitivity.  She has not had any shortness of breath, chest pain, or palpitations.  Activities of Daily Living:  Patient reports morning stiffness for 0 minute.   Patient Denies nocturnal pain.  Difficulty dressing/grooming: Reports Difficulty climbing stairs: Denies Difficulty getting out of chair: Denies Difficulty using hands for taps, buttons, cutlery, and/or writing: Reports  Review of Systems  Constitutional:  Positive for fatigue.  HENT:  Positive for mouth dryness. Negative for mouth sores.   Eyes:  Negative for dryness.  Respiratory:  Negative for shortness of breath.    Cardiovascular:  Negative for chest pain and palpitations.  Gastrointestinal:  Negative for blood in stool, constipation and diarrhea.  Endocrine: Negative for increased urination.  Genitourinary:  Negative for involuntary urination.  Musculoskeletal:  Positive for joint pain and joint pain. Negative for gait problem, joint swelling, myalgias, muscle weakness, morning stiffness, muscle tenderness and myalgias.  Skin:  Negative for color change, rash, hair loss and sensitivity to sunlight.  Allergic/Immunologic: Negative for susceptible to infections.  Neurological:  Negative for dizziness and headaches.  Hematological:  Negative for swollen glands.  Psychiatric/Behavioral:  Positive for sleep disturbance. Negative for depressed mood. The patient is nervous/anxious.     PMFS History:  Patient Active Problem List   Diagnosis Date Noted   Positive ANA (antinuclear antibody) 08/30/2021   Osteopenia 01/05/2021   Iron deficiency anemia 09/01/2020   Insomnia due to psychological stress 04/20/2020   Plantar fasciitis of left foot 06/16/2018   GERD (gastroesophageal reflux disease) 03/06/2018   Obesity (BMI 30.0-34.9) 08/27/2017   Allergic rhinitis 03/14/2013   Vitamin D deficiency 03/14/2013   Elevated blood pressure reading without diagnosis of hypertension 03/14/2013   Other abnormal glucose (prediabetes) 03/14/2013   Mixed hyperlipidemia 03/14/2013   Anxiety 04/13/2009   Major depressive disorder in partial remission (HCC) 04/13/2009   IRRITABLE BOWEL SYNDROME 04/13/2009    Past Medical History:  Diagnosis Date   Anxiety and depression    Broken  bones 09/2022   Broken left wrist   Cholecystitis, acute with cholelithiasis 12/17/2013   Depression    Diverticulitis    DIVERTICULITIS 04/19/2009   Qualifier: Diagnosis of  By: Arlyce Dice MD, Barbette Hair    Hyperlipidemia    Hypertension    Irritable bowel syndrome     Family History  Problem Relation Age of Onset   Heart disease Mother     Hypertension Mother    Osteoporosis Mother    Heart disease Father    Heart attack Father 67   Irritable bowel syndrome Sister    Brain cancer Sister 10   Heart attack Brother 47   Heart disease Brother    Throat cancer Brother        remote smoker   Healthy Son    Cancer Paternal Aunt        oral   Past Surgical History:  Procedure Laterality Date   CHOLECYSTECTOMY N/A 12/17/2013   Procedure: LAPAROSCOPIC CHOLECYSTECTOMY WITH INTRAOPERATIVE CHOLANGIOGRAM;  Surgeon: Claud Kelp, MD;  Location: [facility] OR;  Service: General;  Laterality: N/A;   COLON SURGERY  2012   Diverticulosis   KNEE ARTHROSCOPY Right 2019   x 2   TONSILLECTOMY     VAGINAL HYSTERECTOMY     WRIST SURGERY Left    10/11/2022   Social History   Social History Narrative   Daily caffiene use.  Coke or tea 2-3 daily   Education: HS grad   Work: retired (worked Publishing rights manager).    Poll work.    Immunization History  Administered Date(s) Administered   Influenza, High Dose Seasonal PF 04/10/2018   PFIZER(Purple Top)SARS-COV-2 Vaccination 07/06/2019, 07/27/2019   Pneumococcal Conjugate-13 04/21/2019   Pneumococcal Polysaccharide-23 04/20/2020   Tdap 08/27/2012     Objective: Vital Signs: BP 111/72 (BP Location: Right Arm, Patient Position: Sitting, Cuff Size: Normal)   Pulse 87   Resp 14   Ht 5\' 6"  (1.676 m)   Wt 192 lb (87.1 kg)   BMI 30.99 kg/m    Physical Exam Vitals and nursing note reviewed.  Constitutional:      Appearance: She is well-developed.  HENT:     Head: Normocephalic and atraumatic.  Eyes:     Conjunctiva/sclera: Conjunctivae normal.  Cardiovascular:     Rate and Rhythm: Normal rate and regular rhythm.     Heart sounds: Normal heart sounds.  Pulmonary:     Effort: Pulmonary effort is normal.     Breath sounds: Normal breath sounds.  Abdominal:     General: Bowel sounds are normal.     Palpations: Abdomen is soft.  Musculoskeletal:     Cervical back: Normal range of  motion.  Lymphadenopathy:     Cervical: No cervical adenopathy.  Skin:    General: Skin is warm and dry.     Capillary Refill: Capillary refill takes less than 2 seconds.  Neurological:     Mental Status: She is alert and oriented to person, place, and time.  Psychiatric:        Behavior: Behavior normal.      Musculoskeletal Exam: C-spine, thoracic spine, lumbar spine have good range of motion.  Shoulder joints have good range of motion with no discomfort.  Elbow joints have good range of motion with no tenderness or inflammation.  Right wrist has good range of motion.  CMC joint prominence along with PIP and DIP thickening consistent with osteoarthritis of both hands.  Left wrist is in a compression sleeve.  Hip joints  have good range of motion with no groin pain.  Knee joints have good range of motion with no warmth or effusion.  Some discomfort in the right knee with range of motion.  Ankle joints have good range of motion with no joint tenderness.  No synovitis of MTP joints.  Some PIP and DIP thickening consistent with osteoarthritis of both feet.  CDAI Exam: CDAI Score: -- Patient Global: --; Provider Global: -- Swollen: --; Tender: -- Joint Exam 11/19/2022   No joint exam has been documented for this visit   There is currently no information documented on the homunculus. Go to the Rheumatology activity and complete the homunculus joint exam.  Investigation: No additional findings.  Imaging: No results found.  Recent Labs: Lab Results  Component Value Date   WBC 4.6 10/21/2022   HGB 12.1 10/21/2022   PLT 192 10/21/2022   NA 142 10/21/2022   K 4.3 10/21/2022   CL 109 10/21/2022   CO2 26 10/21/2022   GLUCOSE 91 10/21/2022   BUN 16 10/21/2022   CREATININE 0.68 10/21/2022   BILITOT 0.4 10/21/2022   ALKPHOS 100 08/01/2016   AST 14 10/21/2022   ALT 7 10/21/2022   PROT 6.3 10/21/2022   ALBUMIN 4.3 08/01/2016   CALCIUM 9.4 10/21/2022   GFRAA 88 08/30/2020     Speciality Comments: No specialty comments available.  Procedures:  No procedures performed Allergies: Atorvastatin, Levofloxacin, and Prednisone   Assessment / Plan:     Visit Diagnoses: Positive ANA (antinuclear antibody) - Positive ANA, ENA negative, complements normal.  No clinical features of systemic lupus.  She does not require immunosuppressive therapy at this time.  She has not had any oral or nasal ulcerations, Raynaud's phenomenon, photosensitivity, or recent rashes.  No synovitis noted on examination.  CBC and CMP updated on 10/21/22. Lab work from 10/05/21 was reviewed today in the office: ANA 1:1280NH, anticardiolipin antibodies negative, beta-2 glycoprotein antibodies negaitve, dsDNA negative, C3 199 and C4 WNL, RNP-, Katrinka Blazing-, Ro-, La-, and Scl-70-.  The following lab work will be updated today for further evaluation.  She was advised to notify us if she develops any new or worsening symptoms.  Warning signs and symptoms to monitor for were discussed today.  She will follow-up in the office in 1 month or sooner if needed. - Plan: Protein / creatinine ratio, urine, ANA, C3 and C4, Anti-DNA antibody, double-stranded, Sedimentation rate, RNP Antibody, Anti-Smith antibody  Primary osteoarthritis of both hands: CMC, PIP, DIP prominence consistent with osteoarthritis of both hands.  No tenderness or synovitis over MCP joints.  Complete fist formation noted bilaterally.  Patient fell in July and fractured her left wrist and has been under the care of Dr. Orlan Leavens.  She underwent surgery in August 2024 and has been going through physical therapy twice a week.  She currently has a compression wrap on the left wrist for support.   Discussed the importance of joint protection and muscle strengthening.  Patient was encouraged to perform hand exercises.  She was advised to notify us if she develops signs or symptoms of inflammatory arthritis. ESR will be checked today.  Paresthesia of both hands -  History of paresthesias of bilateral hands since May 2023.  Patient declined nerve conduction velocities.  Not currently symptomatic.    Plantar fasciitis, bilateral - She has been under care of Dr. Ardelle Anton for the last 3 years.  Paresthesia of both feet: Under the care of Dr. Ardelle Anton.  She is taking gabapentin 100 mg  twice daily.  Osteopenia of multiple sites -DEXA updated on 01/04/2021: AP spine BMD 0.907 with T-score -1.3.  Left femoral neck BMD 0.678 with T-score -1.5.  Due to update DEXA in November 2024.  Patient is currently taking vitamin D 10,000 units daily.  Vitamin D deficiency: She is taking vitamin D 10,000 units daily.  Other medical conditions are listed as follows:  Mixed hyperlipidemia  History of gastroesophageal reflux (GERD)  History of IBS  Insomnia due to psychological stress  Other iron deficiency anemia  Anxiety and depression  Elevated hemoglobin A1c  Orders: Orders Placed This Encounter  Procedures   Protein / creatinine ratio, urine   ANA   C3 and C4   Anti-DNA antibody, double-stranded   Sedimentation rate   RNP Antibody   Anti-Smith antibody   No orders of the defined types were placed in this encounter.    Follow-Up Instructions: Return in about 1 year (around 11/19/2023) for +ANA, Osteoarthritis.   Gearldine Bienenstock, PA-C  Note - This record has been created using Dragon software.  Chart creation errors have been sought, but may not always  have been located. Such creation errors do not reflect on  the standard of medical care.

## 2022-11-08 ENCOUNTER — Ambulatory Visit (INDEPENDENT_AMBULATORY_CARE_PROVIDER_SITE_OTHER): Payer: Medicare HMO | Admitting: Podiatry

## 2022-11-08 ENCOUNTER — Encounter: Payer: Self-pay | Admitting: Podiatry

## 2022-11-08 DIAGNOSIS — G629 Polyneuropathy, unspecified: Secondary | ICD-10-CM | POA: Diagnosis not present

## 2022-11-08 NOTE — Patient Instructions (Signed)
You can go up to gabapentin 300mg  at night.   ---  Gabapentin Capsules or Tablets What is this medication? GABAPENTIN (GA ba pen tin) treats nerve pain. It may also be used to prevent and control seizures in people with epilepsy. It works by calming overactive nerves in your body. This medicine may be used for other purposes; ask your health care provider or pharmacist if you have questions. COMMON BRAND NAME(S): Active-PAC with Gabapentin, Ascencion Dike, Gralise, Neurontin What should I tell my care team before I take this medication? They need to know if you have any of these conditions: Kidney disease Lung or breathing disease Substance use disorder Suicidal thoughts, plans, or attempt by you or a family member An unusual or allergic reaction to gabapentin, other medications, foods, dyes, or preservatives Pregnant or trying to get pregnant Breastfeeding How should I use this medication? Take this medication by mouth with a glass of water. Follow the directions on the prescription label. You can take it with or without food. If it upsets your stomach, take it with food. Take your medication at regular intervals. Do not take it more often than directed. Do not stop taking except on your care team's advice. If you are directed to break the 600 or 800 mg tablets in half as part of your dose, the extra half tablet should be used for the next dose. If you have not used the extra half tablet within 28 days, it should be thrown away. A special MedGuide will be given to you by the pharmacist with each prescription and refill. Be sure to read this information carefully each time. Talk to your care team about the use of this medication in children. While this medication may be prescribed for children as young as 3 years for selected conditions, precautions do apply. Overdosage: If you think you have taken too much of this medicine contact a poison control center or emergency room at once. NOTE: This  medicine is only for you. Do not share this medicine with others. What if I miss a dose? If you miss a dose, take it as soon as you can. If it is almost time for your next dose, take only that dose. Do not take double or extra doses. What may interact with this medication? Alcohol Antihistamines for allergy, cough, and cold Certain medications for anxiety or sleep Certain medications for depression like amitriptyline, fluoxetine, sertraline Certain medications for seizures like phenobarbital, primidone Certain medications for stomach problems General anesthetics like halothane, isoflurane, methoxyflurane, propofol Local anesthetics like lidocaine, pramoxine, tetracaine Medications that relax muscles for surgery Opioid medications for pain Phenothiazines like chlorpromazine, mesoridazine, prochlorperazine, thioridazine This list may not describe all possible interactions. Give your health care provider a list of all the medicines, herbs, non-prescription drugs, or dietary supplements you use. Also tell them if you smoke, drink alcohol, or use illegal drugs. Some items may interact with your medicine. What should I watch for while using this medication? Visit your care team for regular checks on your progress. You may want to keep a record at home of how you feel your condition is responding to treatment. You may want to share this information with your care team at each visit. You should contact your care team if your seizures get worse or if you have any new types of seizures. Do not stop taking this medication or any of your seizure medications unless instructed by your care team. Stopping your medication suddenly can increase your seizures or their  severity. This medication may cause serious skin reactions. They can happen weeks to months after starting the medication. Contact your care team right away if you notice fevers or flu-like symptoms with a rash. The rash may be red or purple and then  turn into blisters or peeling of the skin. Or, you might notice a red rash with swelling of the face, lips or lymph nodes in your neck or under your arms. Wear a medical identification bracelet or chain if you are taking this medication for seizures. Carry a card that lists all your medications. This medication may affect your coordination, reaction time, or judgment. Do not drive or operate machinery until you know how this medication affects you. Sit up or stand slowly to reduce the risk of dizzy or fainting spells. Drinking alcohol with this medication can increase the risk of these side effects. Your mouth may get dry. Chewing sugarless gum or sucking hard candy, and drinking plenty of water may help. Watch for new or worsening thoughts of suicide or depression. This includes sudden changes in mood, behaviors, or thoughts. These changes can happen at any time but are more common in the beginning of treatment or after a change in dose. Call your care team right away if you experience these thoughts or worsening depression. If you become pregnant while using this medication, you may enroll in the [place] American Antiepileptic Drug Pregnancy Registry by calling [phone]. This registry collects information about the safety of antiepileptic medication use during pregnancy. What side effects may I notice from receiving this medication? Side effects that you should report to your care team as soon as possible: Allergic reactions or angioedema--skin rash, itching, hives, swelling of the face, eyes, lips, tongue, arms, or legs, trouble swallowing or breathing Rash, fever, and swollen lymph nodes Thoughts of suicide or self harm, worsening mood, feelings of depression Trouble breathing Unusual changes in mood or behavior in children after use such as difficulty concentrating, hostility, or restlessness Side effects that usually do not require medical attention (report to your care team if they continue  or are bothersome): Dizziness Drowsiness Nausea Swelling of ankles, feet, or hands Vomiting This list may not describe all possible side effects. Call your doctor for medical advice about side effects. You may report side effects to FDA at 1-800-FDA-1088. Where should I keep my medication? Keep out of reach of children and pets. Store at room temperature between 15 and 30 degrees C (59 and 86 degrees F). Get rid of any unused medication after the expiration date. This medication may cause accidental overdose and death if taken by other adults, children, or pets. To get rid of medications that are no longer needed or have expired: Take the medication to a medication take-back program. Check with your pharmacy or law enforcement to find a location. If you cannot return the medication, check the label or package insert to see if the medication should be thrown out in the garbage or flushed down the toilet. If you are not sure, ask your care team. If it is safe to put it in the trash, empty the medication out of the container. Mix the medication with cat litter, dirt, coffee grounds, or other unwanted substance. Seal the mixture in a bag or container. Put it in the trash. NOTE: This sheet is a summary. It may not cover all possible information. If you have questions about this medicine, talk to your doctor, pharmacist, or health care provider.  2024 Elsevier/Gold Standard (2021-12-04 00:00:00)

## 2022-11-11 DIAGNOSIS — M25532 Pain in left wrist: Secondary | ICD-10-CM | POA: Diagnosis not present

## 2022-11-14 ENCOUNTER — Encounter: Payer: Self-pay | Admitting: Nurse Practitioner

## 2022-11-14 DIAGNOSIS — R062 Wheezing: Secondary | ICD-10-CM

## 2022-11-14 DIAGNOSIS — R0989 Other specified symptoms and signs involving the circulatory and respiratory systems: Secondary | ICD-10-CM

## 2022-11-14 DIAGNOSIS — R051 Acute cough: Secondary | ICD-10-CM

## 2022-11-14 NOTE — Progress Notes (Signed)
Subjective: Chief Complaint  Patient presents with   Peripheral Neuropathy    Follow up neuropathy bilateral    "They still hurt, tingling and numbness"    72 year old female presents the office today for follow-up evaluation of neuropathy as well as foot pain.  Gabapentin may be helping some.  She has no new concerns today  She saw neurology on January 11, 2022-idiopathic neuropathy  She has seen rheumatology for positive ANA.  Objective: AAO x3, NAD DP/PT pulses palpable bilaterally, CRT less than 3 seconds Unable to appreciate any area pinpoint tenderness.  She is still describing more of tingling, numbness to her feet.  There is no significant on the course or insertion of plantar fascia.  There is no skin edema.  MMT 5/5. No pain with calf compression, swelling, warmth, erythema  Assessment: Neuropathy, history of plantar fasciitis  Plan: -All treatment options discussed with the patient including all alternatives, risks, complications.  -She has previously followed with neurology as well as rheumatology.  She is increase gabapentin to 2 tablets daily we discussed going to 3 tablets as well but monitoring side effects.  Also recommend B complex vitamin as well as alpha lipoic acid. -Consider nerve biopsy as well. -Continue shoes and good arch support as well as stretching, icing a regular basis.  Teresa Vasquez DPM

## 2022-11-18 DIAGNOSIS — M25532 Pain in left wrist: Secondary | ICD-10-CM | POA: Diagnosis not present

## 2022-11-19 ENCOUNTER — Encounter: Payer: Self-pay | Admitting: Physician Assistant

## 2022-11-19 ENCOUNTER — Ambulatory Visit: Payer: Medicare HMO | Attending: Rheumatology | Admitting: Physician Assistant

## 2022-11-19 VITALS — BP 111/72 | HR 87 | Resp 14 | Ht 66.0 in | Wt 192.0 lb

## 2022-11-19 DIAGNOSIS — D508 Other iron deficiency anemias: Secondary | ICD-10-CM

## 2022-11-19 DIAGNOSIS — F419 Anxiety disorder, unspecified: Secondary | ICD-10-CM | POA: Diagnosis not present

## 2022-11-19 DIAGNOSIS — R768 Other specified abnormal immunological findings in serum: Secondary | ICD-10-CM

## 2022-11-19 DIAGNOSIS — M722 Plantar fascial fibromatosis: Secondary | ICD-10-CM | POA: Diagnosis not present

## 2022-11-19 DIAGNOSIS — E782 Mixed hyperlipidemia: Secondary | ICD-10-CM

## 2022-11-19 DIAGNOSIS — R202 Paresthesia of skin: Secondary | ICD-10-CM

## 2022-11-19 DIAGNOSIS — R7309 Other abnormal glucose: Secondary | ICD-10-CM

## 2022-11-19 DIAGNOSIS — M19042 Primary osteoarthritis, left hand: Secondary | ICD-10-CM

## 2022-11-19 DIAGNOSIS — M8589 Other specified disorders of bone density and structure, multiple sites: Secondary | ICD-10-CM | POA: Diagnosis not present

## 2022-11-19 DIAGNOSIS — Z8719 Personal history of other diseases of the digestive system: Secondary | ICD-10-CM

## 2022-11-19 DIAGNOSIS — F32A Depression, unspecified: Secondary | ICD-10-CM

## 2022-11-19 DIAGNOSIS — F5102 Adjustment insomnia: Secondary | ICD-10-CM

## 2022-11-19 DIAGNOSIS — M19041 Primary osteoarthritis, right hand: Secondary | ICD-10-CM | POA: Diagnosis not present

## 2022-11-19 DIAGNOSIS — E559 Vitamin D deficiency, unspecified: Secondary | ICD-10-CM

## 2022-11-19 DIAGNOSIS — R7689 Other specified abnormal immunological findings in serum: Secondary | ICD-10-CM

## 2022-11-21 LAB — C3 AND C4
C3 Complement: 174 mg/dL (ref 83–193)
C4 Complement: 29 mg/dL (ref 15–57)

## 2022-11-21 LAB — ANTI-NUCLEAR AB-TITER (ANA TITER): ANA Titer 1: 1:640 {titer} — ABNORMAL HIGH

## 2022-11-21 LAB — PROTEIN / CREATININE RATIO, URINE
Creatinine, Urine: 65 mg/dL (ref 20–275)
Protein/Creat Ratio: 123 mg/g creat (ref 24–184)
Protein/Creatinine Ratio: 0.123 mg/mg creat (ref 0.024–0.184)
Total Protein, Urine: 8 mg/dL (ref 5–24)

## 2022-11-21 LAB — SEDIMENTATION RATE: Sed Rate: 17 mm/h (ref 0–30)

## 2022-11-21 LAB — ANTI-DNA ANTIBODY, DOUBLE-STRANDED: ds DNA Ab: <1 IU/mL

## 2022-11-21 LAB — ANTI-SMITH ANTIBODY: ENA SM Ab Ser-aCnc: <1 AI

## 2022-11-21 LAB — ANA: Anti Nuclear Antibody (ANA): POSITIVE — AB

## 2022-11-21 LAB — RNP ANTIBODY: Ribonucleic Protein(ENA) Antibody, IgG: <1 AI

## 2022-11-21 NOTE — Progress Notes (Signed)
Protein creatinine ratio WNL  ESR Wnl Complements WNL dsDNA negative  RNP negative  Smith negative

## 2022-11-22 DIAGNOSIS — M25532 Pain in left wrist: Secondary | ICD-10-CM | POA: Diagnosis not present

## 2022-11-22 DIAGNOSIS — S52502D Unspecified fracture of the lower end of left radius, subsequent encounter for closed fracture with routine healing: Secondary | ICD-10-CM | POA: Diagnosis not present

## 2022-11-22 NOTE — Progress Notes (Signed)
ANA 1: 640 which is elevated but better than previous titer.

## 2022-11-25 DIAGNOSIS — M25532 Pain in left wrist: Secondary | ICD-10-CM | POA: Diagnosis not present

## 2022-12-02 MED ORDER — TRAZODONE HCL 150 MG PO TABS: 150.0000 mg | ORAL_TABLET | Freq: Every day | ORAL | 2 refills | Status: DC

## 2023-01-02 ENCOUNTER — Other Ambulatory Visit: Payer: Self-pay | Admitting: Nurse Practitioner

## 2023-01-02 MED ORDER — HYDROXYZINE PAMOATE 25 MG PO CAPS: 25.0000 mg | ORAL_CAPSULE | Freq: Every evening | ORAL | 0 refills | Status: DC | PRN

## 2023-01-08 ENCOUNTER — Other Ambulatory Visit: Payer: Self-pay

## 2023-01-08 ENCOUNTER — Ambulatory Visit (INDEPENDENT_AMBULATORY_CARE_PROVIDER_SITE_OTHER): Payer: Medicare HMO | Admitting: Nurse Practitioner

## 2023-01-08 ENCOUNTER — Encounter: Payer: Self-pay | Admitting: Nurse Practitioner

## 2023-01-08 VITALS — BP 110/68 | HR 90 | Temp 97.7°F | Ht 66.0 in | Wt 189.4 lb

## 2023-01-08 DIAGNOSIS — R051 Acute cough: Secondary | ICD-10-CM

## 2023-01-08 DIAGNOSIS — J40 Bronchitis, not specified as acute or chronic: Secondary | ICD-10-CM

## 2023-01-08 DIAGNOSIS — R062 Wheezing: Secondary | ICD-10-CM

## 2023-01-08 DIAGNOSIS — Z1152 Encounter for screening for COVID-19: Secondary | ICD-10-CM

## 2023-01-08 DIAGNOSIS — R6889 Other general symptoms and signs: Secondary | ICD-10-CM | POA: Diagnosis not present

## 2023-01-08 LAB — POC COVID19 BINAXNOW: SARS Coronavirus 2 Ag: NEGATIVE

## 2023-01-08 LAB — POCT INFLUENZA A/B
Influenza A, POC: NEGATIVE
Influenza B, POC: NEGATIVE

## 2023-01-08 MED ORDER — BENZONATATE 200 MG PO CAPS: ORAL_CAPSULE | ORAL | 1 refills | Status: DC

## 2023-01-08 MED ORDER — PROMETHAZINE-DM 6.25-15 MG/5ML PO SYRP
5.0000 mL | ORAL_SOLUTION | Freq: Every evening | ORAL | 0 refills | Status: DC | PRN
Start: 2023-01-08 — End: 2023-04-25

## 2023-01-08 MED ORDER — IPRATROPIUM-ALBUTEROL 0.5-2.5 (3) MG/3ML IN SOLN: 3.0000 mL | RESPIRATORY_TRACT | 2 refills | Status: DC | PRN

## 2023-01-08 MED ORDER — DEXAMETHASONE SODIUM PHOSPHATE 10 MG/ML IJ SOLN
10.0000 mg | Freq: Once | INTRAMUSCULAR | Status: AC
Start: 2023-01-08 — End: 2023-01-08
  Administered 2023-01-08: 10 mg via INTRAMUSCULAR

## 2023-01-08 NOTE — Patient Instructions (Signed)
Acute Bronchitis, Adult  Acute bronchitis is when air tubes in the lungs (bronchi) suddenly get swollen. The condition can make it hard for you to breathe. In adults, acute bronchitis usually goes away within 2 weeks. A cough caused by bronchitis may last up to 3 weeks. Smoking, allergies, and asthma can make the condition worse. What are the causes? Germs that cause cold and flu (viruses). The most common cause of this condition is the virus that causes the common cold. Bacteria. Substances that bother (irritate) the lungs, including: Smoke from cigarettes and other types of tobacco. Dust and pollen. Fumes from chemicals, gases, or burned fuel. Indoor or outdoor air pollution. What increases the risk? A weak body's defense system. This is also called the immune system. Any condition that affects your lungs and breathing, such as asthma. What are the signs or symptoms? A cough. Coughing up clear, yellow, or green mucus. Making high-pitched whistling sounds when you breathe, most often when you breathe out (wheezing). Runny or stuffy nose. Having too much mucus in your lungs (chest congestion). Shortness of breath. Body aches. A sore throat. How is this treated? Acute bronchitis may go away over time without treatment. Your doctor may tell you to: Drink more fluids. This will help thin your mucus so it is easier to cough up. Use a device that gets medicine into your lungs (inhaler). Use a vaporizer or a humidifier. These are machines that add water to the air. This helps with coughing and poor breathing. Take a medicine that thins mucus and helps clear it from your lungs. Take a medicine that prevents or stops coughing. It is not common to take an antibiotic medicine for this condition. Follow these instructions at home:  Take over-the-counter and prescription medicines only as told by your doctor. Use an inhaler, vaporizer, or humidifier as told by your doctor. Take two teaspoons  (10 mL) of honey at bedtime. This helps lessen your coughing at night. Drink enough fluid to keep your pee (urine) pale yellow. Do not smoke or use any products that contain nicotine or tobacco. If you need help quitting, ask your doctor. Get a lot of rest. Return to your normal activities when your doctor says that it is safe. Keep all follow-up visits. How is this prevented?  Wash your hands often with soap and water for at least 20 seconds. If you cannot use soap and water, use hand sanitizer. Avoid contact with people who have cold symptoms. Try not to touch your mouth, nose, or eyes with your hands. Avoid breathing in smoke or chemical fumes. Make sure to get the flu shot every year. Contact a doctor if: Your symptoms do not get better in 2 weeks. You have trouble coughing up the mucus. Your cough keeps you awake at night. You have a fever. Get help right away if: You cough up blood. You have chest pain. You have very bad shortness of breath. You faint or keep feeling like you are going to faint. You have a very bad headache. Your fever or chills get worse. These symptoms may be an emergency. Get help right away. Call your local emergency services (911 in the [place] Do not wait to see if the symptoms will go away. Do not drive yourself to the hospital. Summary Acute bronchitis is when air tubes in the lungs (bronchi) suddenly get swollen. In adults, acute bronchitis usually goes away within 2 weeks. Drink more fluids. This will help thin your mucus so it is easier  to cough up. Take over-the-counter and prescription medicines only as told by your doctor. Contact a doctor if your symptoms do not improve after 2 weeks of treatment. This information is not intended to replace advice given to you by your health care provider. Make sure you discuss any questions you have with your health care provider. Document Revised: 06/21/2020 Document Reviewed: 06/21/2020 Elsevier Patient  Education  2024 [employer].

## 2023-01-08 NOTE — Progress Notes (Signed)
Assessment and Plan:  Teresa Vasquez was seen today for an episodic visit.  Diagnoses and all order for this visit:  Flu-like symptoms Negative  - POCT Influenza A/B  Encounter for screening for COVID-19 Negative  - POC COVID-19  Bronchitis Continue Albuterol inhaler as directed Duoneb solution sent - use as directed - defers nebulizer tmt in office today Stay well hydrated to keep mucus thin and productive.  - dexamethasone (DECADRON) injection 10 mg - ipratropium-albuterol (DUONEB) 0.5-2.5 (3) MG/3ML SOLN; Take 3 mLs by nebulization every 4 (four) hours as needed.  Dispense: 90 mL; Refill: 2  Wheezing Continue Albuterol inhaler as directed Duoneb solution sent - use as directed - defers nebulizer tmt in office today Stay well hydrated to keep mucus thin and productive. Report to ER or call 911 for any increase in difficulty breathing.  - dexamethasone (DECADRON) injection 10 mg - benzonatate (TESSALON) 200 MG capsule; Take 1 perle 3 x / day to prevent cough  Dispense: 30 capsule; Refill: 1 - promethazine-dextromethorphan (PROMETHAZINE-DM) 6.25-15 MG/5ML syrup; Take 5 mLs by mouth at bedtime as needed for cough.  Dispense: 240 mL; Refill: 0 - ipratropium-albuterol (DUONEB) 0.5-2.5 (3) MG/3ML SOLN; Take 3 mLs by nebulization every 4 (four) hours as needed.  Dispense: 90 mL; Refill: 2  Acute cough May take Tessalon during the dayand cough syrup during the night. Continue Albuterol and nebulizer Stay well hydrated to keep mucus thin and productive. Report to ER or call 911 for any increase in difficulty breathing.  - benzonatate (TESSALON) 200 MG capsule; Take 1 perle 3 x / day to prevent cough  Dispense: 30 capsule; Refill: 1 - promethazine-dextromethorphan (PROMETHAZINE-DM) 6.25-15 MG/5ML syrup; Take 5 mLs by mouth at bedtime as needed for cough.  Dispense: 240 mL; Refill: 0 - ipratropium-albuterol (DUONEB) 0.5-2.5 (3) MG/3ML SOLN; Take 3 mLs by nebulization every 4  (four) hours as needed.  Dispense: 90 mL; Refill: 2   Notify office for further evaluation and treatment, questions or concerns if s/s fail to improve. The risks and benefits of my recommendations, as well as other treatment options were discussed with the patient today. Questions were answered.  Further disposition pending results of labs. Discussed med's effects and SE's.    Over 20 minutes of exam, counseling, chart review, and critical decision making was performed.   Future Appointments  Date Time Provider Department Center  02/07/2023 12:45 PM Vivi Barrack, DPM [facility]  06/19/2023  2:00 PM Adela Glimpse, NP [facility] None  10/21/2023 11:30 AM Adela Glimpse, NP [facility] None  11/19/2023  1:00 PM Deveshwar, Janalyn Rouse, MD [facility] None    ------------------------------------------------------------------------------------------------------------------   HPI BP 110/68   Pulse 90   Temp 97.7 F (36.5 C)   Ht 5\' 6"  (1.676 m)   Wt 189 lb 6.4 oz (85.9 kg)   SpO2 98%   BMI 30.57 kg/m    Patient complains of symptoms of a URI. Symptoms include congestion, cough described as nonproductive, nasal congestion, and wheezing. Onset of symptoms was 3 days ago, and has been unchanged since that time. Treatment to date: antihistamines and decongestants, albuterol inhaler. She denies fever, chills, N/V, SOB, difficulty breathing. She does report working [employer] and being in contact with many different people.   Past Medical History:  Diagnosis Date   Anxiety and depression    Broken bones 09/2022   Broken left wrist   Cholecystitis, acute with cholelithiasis 12/17/2013   Depression    Diverticulitis    DIVERTICULITIS  04/19/2009   Qualifier: Diagnosis of  By: Arlyce Dice MD, Barbette Hair    Hyperlipidemia    Hypertension    Irritable bowel syndrome      Allergies  Allergen Reactions   Atorvastatin Other (See Comments)    Leg cramps   Levofloxacin    Prednisone      Current Outpatient Medications on File Prior to Visit  Medication Sig   albuterol (VENTOLIN HFA) 108 (90 Base) MCG/ACT inhaler TAKE 2 PUFFS BY MOUTH EVERY 6 HOURS AS NEEDED FOR WHEEZE OR SHORTNESS OF BREATH   aspirin 81 MG tablet Take 81 mg by mouth daily.    busPIRone (BUSPAR) 5 MG tablet TAKE 1 TABLET BY MOUTH TWICE A DAY   Cholecalciferol (VITAMIN D3) 125 MCG (5000 UT) CAPS Takes 2 capsules (10,000 units )  Daily   CINNAMON PO Take 1,200 mg by mouth.   docusate sodium (COLACE) 100 MG capsule Take 100 mg by mouth 2 (two) times daily.   ELDERBERRY PO Take by mouth. Calcium and vit c in this supplement   Ferrous Sulfate (IRON PO) Take 18 mg by mouth.   fluticasone (FLONASE) 50 MCG/ACT nasal spray USE 2 SPRAYS TO EACH NOSTRIL DAILY   gabapentin (NEURONTIN) 100 MG capsule Take 1 capsule (100 mg total) by mouth 2 (two) times daily.   hydrOXYzine (VISTARIL) 25 MG capsule Take 1 capsule (25 mg total) by mouth at bedtime as needed.   omeprazole (PRILOSEC) 40 MG capsule Take  1 capsule  Daily to Prevent Heartburn & Indigestion                                 /                              TAKE                        BY                     MOUTH                        ONCE DAILY   rosuvastatin (CRESTOR) 5 MG tablet TAKE 1 TAB DAILY FOR CHOLESTEROL.   SODIUM FLUORIDE 5000 PPM 1.1 % PSTE Take by mouth at bedtime.   traZODone (DESYREL) 150 MG tablet Take 1 tablet (150 mg total) by mouth at bedtime.   Zinc 50 MG TABS Take by mouth.   montelukast (SINGULAIR) 10 MG tablet TAKE 1 TABLET BY MOUTH DAILY FOR ALLERGIES (Patient not taking: Reported on 01/08/2023)   oxyCODONE (ROXICODONE) 5 MG immediate release tablet Take 1 tablet (5 mg total) by mouth every 6 (six) hours as needed for severe pain. (Patient not taking: Reported on 11/19/2022)   phentermine (ADIPEX-P) 37.5 MG tablet Take  1/2 to 1 tablet  every Morning  for Dieting & Weight Loss (Patient not taking: Reported on 11/19/2022)   No current  facility-administered medications on file prior to visit.    ROS: all negative except what is noted in the HPI.   Physical Exam:  BP 110/68   Pulse 90   Temp 97.7 F (36.5 C)   Ht 5\' 6"  (1.676 m)   Wt 189 lb 6.4 oz (85.9 kg)   SpO2 98%   BMI 30.57 kg/m  General Appearance: NAD.  Awake, conversant and cooperative. Eyes: PERRLA, EOMs intact.  Sclera white.  Conjunctiva without erythema. Sinuses: No frontal/maxillary tenderness.  No nasal discharge. Nares patent.  ENT/Mouth: Ext aud canals clear.  Bilateral TMs w/DOL and without erythema or bulging. Hearing intact.  Posterior pharynx without swelling or exudate.  Tonsils without swelling or erythema.  Neck: Supple.  No masses, nodules or thyromegaly. Respiratory: Effort is regular with non-labored breathing. Breath sounds are equal bilaterally with scattered wheezing upon inspiration in anterior upper lung fields. Cardio: RRR with no MRGs. Brisk peripheral pulses without edema.  Abdomen: Active BS in all four quadrants.  Soft and non-tender without guarding, rebound tenderness, hernias or masses. Lymphatics: Non tender without lymphadenopathy.  Musculoskeletal: Full ROM, 5/5 strength, normal ambulation.  No clubbing or cyanosis. Skin: Appropriate color for ethnicity. Warm without rashes, lesions, ecchymosis, ulcers.  Neuro: CN II-XII grossly normal. Normal muscle tone without cerebellar symptoms and intact sensation.   Psych: AO X 3,  appropriate mood and affect, insight and judgment.     Adela Glimpse, NP 10:30 AM [facility]

## 2023-01-09 ENCOUNTER — Other Ambulatory Visit: Payer: Self-pay | Admitting: Nurse Practitioner

## 2023-01-09 MED ORDER — AZITHROMYCIN 250 MG PO TABS: ORAL_TABLET | ORAL | 1 refills | Status: DC

## 2023-01-17 DIAGNOSIS — S52502A Unspecified fracture of the lower end of left radius, initial encounter for closed fracture: Secondary | ICD-10-CM | POA: Diagnosis not present

## 2023-01-17 DIAGNOSIS — M25532 Pain in left wrist: Secondary | ICD-10-CM | POA: Diagnosis not present

## 2023-01-22 ENCOUNTER — Encounter: Payer: Self-pay | Admitting: Nurse Practitioner

## 2023-01-22 ENCOUNTER — Other Ambulatory Visit: Payer: Self-pay

## 2023-01-22 ENCOUNTER — Ambulatory Visit (INDEPENDENT_AMBULATORY_CARE_PROVIDER_SITE_OTHER): Payer: Medicare HMO | Admitting: Nurse Practitioner

## 2023-01-22 VITALS — BP 130/70 | HR 101 | Temp 97.3°F | Ht 66.0 in | Wt 189.0 lb

## 2023-01-22 DIAGNOSIS — R5383 Other fatigue: Secondary | ICD-10-CM | POA: Diagnosis not present

## 2023-01-22 DIAGNOSIS — Z1152 Encounter for screening for COVID-19: Secondary | ICD-10-CM | POA: Diagnosis not present

## 2023-01-22 LAB — POC COVID19 BINAXNOW: SARS Coronavirus 2 Ag: NEGATIVE

## 2023-01-22 NOTE — Patient Instructions (Addendum)

## 2023-01-22 NOTE — Progress Notes (Signed)
Assessment and Plan:  Teresa Vasquez was seen today for an episodic visit.  Diagnoses and all order for this visit:  Encounter for screening for COVID-19 Negative  - POC COVID-19  Other fatigue Stay well hydrated Discussed proper sleep pattern Continue to monitor    Notify office for further evaluation and treatment, questions or concerns if s/s fail to improve. The risks and benefits of my recommendations, as well as other treatment options were discussed with the patient today. Questions were answered.  Further disposition pending results of labs. Discussed med's effects and SE's.    Over 20 minutes of exam, counseling, chart review, and critical decision making was performed.   Future Appointments  Date Time Provider Department Center  02/07/2023 12:45 PM Vivi Barrack, DPM [facility]  06/19/2023  2:00 PM Adela Glimpse, NP [facility] None  10/21/2023 11:30 AM Adela Glimpse, NP [facility] None  11/19/2023  1:00 PM Deveshwar, Janalyn Rouse, MD [facility] None    ------------------------------------------------------------------------------------------------------------------   HPI BP 130/70   Pulse (!) 101   Temp (!) 97.3 F (36.3 C)   Ht 5\' 6"  (1.676 m)   Wt 189 lb (85.7 kg)   SpO2 98%   BMI 30.51 kg/m    Patient complains of symptoms of a URI. Symptoms include cough described as nonproductive and fatigue . Onset of symptoms was 2 days ago, and has been unchanged since that time. Her husband tested positive for Covid yesterday.   Past Medical History:  Diagnosis Date   Anxiety and depression    Broken bones 09/2022   Broken left wrist   Cholecystitis, acute with cholelithiasis 12/17/2013   Depression    Diverticulitis    DIVERTICULITIS 04/19/2009   Qualifier: Diagnosis of  By: Arlyce Dice MD, Barbette Hair    Hyperlipidemia    Hypertension    Irritable bowel syndrome      Allergies  Allergen Reactions   Atorvastatin Other (See Comments)    Leg cramps    Levofloxacin    Prednisone     Current Outpatient Medications on File Prior to Visit  Medication Sig   albuterol (VENTOLIN HFA) 108 (90 Base) MCG/ACT inhaler TAKE 2 PUFFS BY MOUTH EVERY 6 HOURS AS NEEDED FOR WHEEZE OR SHORTNESS OF BREATH   aspirin 81 MG tablet Take 81 mg by mouth daily.    azithromycin (ZITHROMAX) 250 MG tablet Take 2 tablets on  Day 1,  followed by 1 tablet  daily for 4 more days    for Sinusitis  /Bronchitis   benzonatate (TESSALON) 200 MG capsule Take 1 perle 3 x / day to prevent cough   busPIRone (BUSPAR) 5 MG tablet TAKE 1 TABLET BY MOUTH TWICE A DAY   Cholecalciferol (VITAMIN D3) 125 MCG (5000 UT) CAPS Takes 2 capsules (10,000 units )  Daily   CINNAMON PO Take 1,200 mg by mouth.   docusate sodium (COLACE) 100 MG capsule Take 100 mg by mouth 2 (two) times daily.   ELDERBERRY PO Take by mouth. Calcium and vit c in this supplement   Ferrous Sulfate (IRON PO) Take 18 mg by mouth.   fluticasone (FLONASE) 50 MCG/ACT nasal spray USE 2 SPRAYS TO EACH NOSTRIL DAILY   gabapentin (NEURONTIN) 100 MG capsule Take 1 capsule (100 mg total) by mouth 2 (two) times daily.   hydrOXYzine (VISTARIL) 25 MG capsule Take 1 capsule (25 mg total) by mouth at bedtime as needed.   ipratropium-albuterol (DUONEB) 0.5-2.5 (3) MG/3ML SOLN Take 3 mLs by nebulization every 4 (  four) hours as needed.   montelukast (SINGULAIR) 10 MG tablet TAKE 1 TABLET BY MOUTH DAILY FOR ALLERGIES   omeprazole (PRILOSEC) 40 MG capsule TAKE 1 CAPSULE BY MOUTH ONCE DAILY TO PREVENT HEARTBURN AND INDIGESTION   phentermine (ADIPEX-P) 37.5 MG tablet Take  1/2 to 1 tablet  every Morning  for Dieting & Weight Loss   promethazine-dextromethorphan (PROMETHAZINE-DM) 6.25-15 MG/5ML syrup Take 5 mLs by mouth at bedtime as needed for cough.   rosuvastatin (CRESTOR) 5 MG tablet TAKE 1 TAB DAILY FOR CHOLESTEROL.   SODIUM FLUORIDE 5000 PPM 1.1 % PSTE Take by mouth at bedtime.   traZODone (DESYREL) 150 MG tablet Take 1 tablet (150 mg  total) by mouth at bedtime.   Zinc 50 MG TABS Take by mouth.   No current facility-administered medications on file prior to visit.    ROS: all negative except what is noted in the HPI.   Physical Exam:  BP 130/70   Pulse (!) 101   Temp (!) 97.3 F (36.3 C)   Ht 5\' 6"  (1.676 m)   Wt 189 lb (85.7 kg)   SpO2 98%   BMI 30.51 kg/m   General Appearance: NAD.  Awake, conversant and cooperative. Eyes: PERRLA, EOMs intact.  Sclera white.  Conjunctiva without erythema. Sinuses: No frontal/maxillary tenderness.  No nasal discharge. Nares patent.  ENT/Mouth: Ext aud canals clear.  Bilateral TMs w/DOL and without erythema or bulging. Hearing intact.  Posterior pharynx without swelling or exudate.  Tonsils without swelling or erythema.  Neck: Supple.  No masses, nodules or thyromegaly. Respiratory: Effort is regular with non-labored breathing. Breath sounds are equal bilaterally without rales, rhonchi, wheezing or stridor.  Cardio: RRR with no MRGs. Brisk peripheral pulses without edema.  Abdomen: Active BS in all four quadrants.  Soft and non-tender without guarding, rebound tenderness, hernias or masses. Lymphatics: Non tender without lymphadenopathy.  Musculoskeletal: Full ROM, 5/5 strength, normal ambulation.  No clubbing or cyanosis. Skin: Appropriate color for ethnicity. Warm without rashes, lesions, ecchymosis, ulcers.  Neuro: CN II-XII grossly normal. Normal muscle tone without cerebellar symptoms and intact sensation.   Psych: AO X 3,  appropriate mood and affect, insight and judgment.     Adela Glimpse, NP 4:57 PM [facility]

## 2023-01-31 ENCOUNTER — Other Ambulatory Visit: Payer: Self-pay | Admitting: Nurse Practitioner

## 2023-02-06 ENCOUNTER — Other Ambulatory Visit: Payer: Self-pay | Admitting: Nurse Practitioner

## 2023-02-06 MED ORDER — FLUTICASONE-SALMETEROL 100-50 MCG/ACT IN AEPB
1.0000 | INHALATION_SPRAY | Freq: Two times a day (BID) | RESPIRATORY_TRACT | 0 refills | Status: DC
Start: 2023-02-06 — End: 2023-03-06

## 2023-02-06 MED ORDER — MONTELUKAST SODIUM 10 MG PO TABS: ORAL_TABLET | ORAL | 3 refills | Status: AC

## 2023-02-06 MED ORDER — BUDESONIDE-FORMOTEROL FUMARATE 160-4.5 MCG/ACT IN AERO
2.0000 | INHALATION_SPRAY | Freq: Two times a day (BID) | RESPIRATORY_TRACT | 12 refills | Status: DC
Start: 2023-02-06 — End: 2023-02-06

## 2023-02-07 ENCOUNTER — Ambulatory Visit
Admission: RE | Admit: 2023-02-07 | Discharge: 2023-02-07 | Disposition: A | Discharge status: Home / Self Care | Payer: Medicare HMO | Source: Ambulatory Visit | Attending: Nurse Practitioner | Admitting: Nurse Practitioner

## 2023-02-07 ENCOUNTER — Ambulatory Visit: Payer: Medicare HMO | Admitting: Podiatry

## 2023-02-07 DIAGNOSIS — R062 Wheezing: Secondary | ICD-10-CM | POA: Diagnosis not present

## 2023-02-07 DIAGNOSIS — R052 Subacute cough: Secondary | ICD-10-CM | POA: Diagnosis not present

## 2023-02-07 DIAGNOSIS — R0989 Other specified symptoms and signs involving the circulatory and respiratory systems: Secondary | ICD-10-CM

## 2023-02-07 DIAGNOSIS — R051 Acute cough: Secondary | ICD-10-CM

## 2023-02-18 ENCOUNTER — Encounter: Payer: Self-pay | Admitting: Nurse Practitioner

## 2023-02-18 DIAGNOSIS — R051 Acute cough: Secondary | ICD-10-CM

## 2023-02-18 DIAGNOSIS — R0609 Other forms of dyspnea: Secondary | ICD-10-CM

## 2023-02-18 DIAGNOSIS — R062 Wheezing: Secondary | ICD-10-CM

## 2023-02-18 DIAGNOSIS — R0989 Other specified symptoms and signs involving the circulatory and respiratory systems: Secondary | ICD-10-CM

## 2023-02-18 DIAGNOSIS — J40 Bronchitis, not specified as acute or chronic: Secondary | ICD-10-CM

## 2023-02-28 ENCOUNTER — Other Ambulatory Visit: Payer: Self-pay | Admitting: Nurse Practitioner

## 2023-03-05 ENCOUNTER — Other Ambulatory Visit: Payer: Self-pay | Admitting: Nurse Practitioner

## 2023-03-06 ENCOUNTER — Encounter: Payer: Self-pay | Admitting: Nurse Practitioner

## 2023-03-06 ENCOUNTER — Ambulatory Visit (INDEPENDENT_AMBULATORY_CARE_PROVIDER_SITE_OTHER): Payer: Medicare HMO | Admitting: Nurse Practitioner

## 2023-03-06 VITALS — BP 120/72 | HR 87 | Temp 97.8°F | Ht 66.0 in | Wt 196.8 lb

## 2023-03-06 DIAGNOSIS — R109 Unspecified abdominal pain: Secondary | ICD-10-CM | POA: Diagnosis not present

## 2023-03-06 DIAGNOSIS — N393 Stress incontinence (female) (male): Secondary | ICD-10-CM | POA: Diagnosis not present

## 2023-03-06 DIAGNOSIS — R102 Pelvic and perineal pain: Secondary | ICD-10-CM

## 2023-03-06 NOTE — Patient Instructions (Addendum)
 Urinary Tract Infection, Adult A urinary tract infection (UTI) is an infection of any part of the urinary tract. The urinary tract includes: The kidneys. The ureters. The bladder. The urethra. These organs make, store, and get rid of pee (urine) in the body. What are the causes? This infection is caused by germs (bacteria) in your genital area. These germs grow and cause swelling (inflammation) of your urinary tract. What increases the risk? The following factors may make you more likely to develop this condition: Using a small, thin tube (catheter) to drain pee. Not being able to control when you pee or poop (incontinence). Being female. If you are female, these things can increase the risk: Using these methods to prevent pregnancy: A medicine that kills sperm (spermicide). A device that blocks sperm (diaphragm). Having low levels of a female hormone (estrogen). Being pregnant. You are more likely to develop this condition if: You have genes that add to your risk. You are sexually active. You take antibiotic medicines. You have trouble peeing because of: A prostate that is bigger than normal, if you are female. A blockage in the part of your body that drains pee from the bladder. A kidney stone. A nerve condition that affects your bladder. Not getting enough to drink. Not peeing often enough. You have other conditions, such as: Diabetes. A weak disease-fighting system (immune system). Sickle cell disease. Gout. Injury of the spine. What are the signs or symptoms? Symptoms of this condition include: Needing to pee right away. Peeing small amounts often. Pain or burning when peeing. Blood in the pee. Pee that smells bad or not like normal. Trouble peeing. Pee that is cloudy. Fluid coming from the vagina, if you are female. Pain in the belly or lower back. Other symptoms include: Vomiting. Not feeling hungry. Feeling mixed up (confused). This may be the first symptom in  older adults. Being tired and grouchy (irritable). A fever. Watery poop (diarrhea). How is this treated? Taking antibiotic medicine. Taking other medicines. Drinking enough water . In some cases, you may need to see a specialist. Follow these instructions at home:  Medicines Take over-the-counter and prescription medicines only as told by your doctor. If you were prescribed an antibiotic medicine, take it as told by your doctor. Do not stop taking it even if you start to feel better. General instructions Make sure you: Pee until your bladder is empty. Do not hold pee for a long time. Empty your bladder after sex. Wipe from front to back after peeing or pooping if you are a female. Use each tissue one time when you wipe. Drink enough fluid to keep your pee pale yellow. Keep all follow-up visits. Contact a doctor if: You do not get better after 1-2 days. Your symptoms go away and then come back. Get help right away if: You have very bad back pain. You have very bad pain in your lower belly. You have a fever. You have chills. You feeling like you will vomit or you vomit. Summary A urinary tract infection (UTI) is an infection of any part of the urinary tract. This condition is caused by germs in your genital area. There are many risk factors for a UTI. Treatment includes antibiotic medicines. Drink enough fluid to keep your pee pale yellow. This information is not intended to replace advice given to you by your health care provider. Make sure you discuss any questions you have with your health care provider. Document Revised: 09/26/2019 Document Reviewed: 10/01/2019 Elsevier Patient Education  2024 [employer].  Kidney Stones Kidney stones are rock-like masses that form inside of the kidneys. Kidneys are organs that make pee (urine). A kidney stone may move into other parts of the urinary tract, including: The tubes that connect the kidneys to the bladder (ureters). The  bladder. The tube that carries urine out of the body (urethra). Kidney stones can cause very bad pain and can block the flow of pee. The stone usually leaves your body through your pee. A doctor may need to take out the stone. What are the causes? Kidney stones may be caused by: Too much calcium  in the body. This may be caused by too much parathyroid hormone in the blood. Uric acid crystals in the bladder. The body makes uric acid when you eat certain foods. Narrowing of one or both of the ureters. A kidney blockage that you were born with. Past surgery on the kidney or the ureters. What increases the risk? You are more likely to develop this condition if: You have had a kidney stone in the past. Other people in your family have had kidney stones. You do not drink enough water . You eat a diet that is high in protein, salt (sodium), or sugar. You are very overweight (obese). What are the signs or symptoms? Symptoms of a kidney stone may include: Pain in the side of the belly, right below the ribs. Pain usually spreads to the groin. Needing to pee often or right away. Pain when peeing. Blood in your pee. Feeling like you may vomit (nauseous). Vomiting. Fever and chills. How is this treated? Treatment depends on the size, location, and makeup of the kidney stones. The stones will often pass out of the body when you pee. You may need to: Drink more fluid to help pass the stone. In some cases, you may be given fluids through an IV tube at the hospital. Take medicine for pain. Change your diet to help keep kidney stones from coming back. Sometimes, you may need: A procedure to break up kidney stones using a beam of light (laser) or shock waves. Surgery to remove the kidney stones. Follow these instructions at home: Medicines Take over-the-counter and prescription medicines only as told by your doctor. Ask your doctor if the medicine prescribed to you requires you to avoid driving or  using machinery. Eating and drinking Drink enough fluid to keep your pee pale yellow. You may be told to drink at least 8-10 glasses of water  each day. This will help you pass the stone. If told by your doctor, change your diet. You may be told to: Limit how much salt you eat. Eat more fruits and vegetables. Limit how much meat, poultry, fish, and eggs you eat. Follow instructions from your doctor about what you may eat and drink. General instructions Collect pee samples as told by your doctor. You may need to collect a pee sample: 24 hours after a stone comes out. 8-12 weeks after a stone comes out, and every 6-12 months after that. Strain your pee every time you pee. Use the strainer that your doctor recommends. Do not throw out the stone. Keep it so that it can be tested by your doctor. Keep all follow-up visits. You may need X-rays and ultrasounds to make sure the stone has come out. How is this prevented? To prevent another kidney stone: Drink enough fluid to keep your pee pale yellow. This is the best way to prevent kidney stones. Eat healthy foods. Avoid certain foods as  told by your doctor. You may be told to eat less protein. Stay at a healthy weight. Where to find more information National Kidney Foundation (NKF): kidney.org [facility]): urologyhealth.org Contact a doctor if: You have pain that gets worse or does not get better with medicine. Get help right away if: You have a fever or chills. You get very bad pain. You get new pain in your belly. You faint. You cannot pee. This information is not intended to replace advice given to you by your health care provider. Make sure you discuss any questions you have with your health care provider. Document Revised: 10/12/2021 Document Reviewed: 10/12/2021 Elsevier Patient Education  2024 [employer].

## 2023-03-06 NOTE — Progress Notes (Signed)
 Assessment and Plan:  BIANNCA SCANTLIN was seen today for an episodic visit.  Diagnoses and all order for this visit:  1. Right flank pain (Primary) Assess for UTI - pending UA/culture discussed will treat with abx if positive for bacteria. Possible nephrolithiasis - discussed KUB US  imaging if UA/Cx negative.  Stay well hydrated to keep urinary system well flushed Consider daily cranberry juice or oral supplement to help any bacteria from adhering to bladder wall causing increase for infection. Monitor for any increase in fever, chills, N/V, abdominal pain, hematuria.   Contact office or report to ER for further evaluation if s/s fail to improve or any sign of worsening infection as noted above.  - Urinalysis, Routine w reflex microscopic - Urine Culture  2. Suprapubic pressure  - Urinalysis, Routine w reflex microscopic - Urine Culture  3. Stress incontinence Kegal exercises to promote strengthening of pelvic floor muscles Change pads often Continue to monitor  Notify office for further evaluation and treatment, questions or concerns if s/s fail to improve. The risks and benefits of my recommendations, as well as other treatment options were discussed with the patient today. Questions were answered.  Further disposition pending results of labs. Discussed med's effects and SE's.    Over 20 minutes of exam, counseling, chart review, and critical decision making was performed.   Future Appointments  Date Time Provider Department Center  06/19/2023  2:00 PM Laurice President, NP [facility] None  10/21/2023 11:30 AM Laurice President, NP [facility] None  11/19/2023  1:00 PM Deveshwar, Maya, MD [facility] None    ------------------------------------------------------------------------------------------------------------------   HPI BP 120/72   Pulse 87   Temp 97.8 F (36.6 C)   Ht 5' 6 (1.676 m)   Wt 196 lb 12.8 oz (89.3 kg)   SpO2 99%   BMI 31.76 kg/m    Patient complains  of right flank pain, frequency, incontinence, and suprapubic pressure.  Pain radiates from right flank to right groin.  She has had symptoms for 1  month . Patient denies congestion, cough, fever, headache, rhinitis, sorethroat, and vaginal discharge, hematuria. Positive for chills.  Patient does not have a history of recurrent UTI. Patient does not have a history of pyelonephritis. Reports onset was after having bronchitis and coughing for several weeks.  She has no hx of nephrolithiasis.     Past Medical History:  Diagnosis Date   Anxiety and depression    Broken bones 09/2022   Broken left wrist   Cholecystitis, acute with cholelithiasis 12/17/2013   Depression    Diverticulitis    DIVERTICULITIS 04/19/2009   Qualifier: Diagnosis of  By: Debrah MD, Lamar BIRCH    Hyperlipidemia    Hypertension    Irritable bowel syndrome      Allergies  Allergen Reactions   Atorvastatin  Other (See Comments)    Leg cramps   Levofloxacin    Prednisone      Current Outpatient Medications on File Prior to Visit  Medication Sig   albuterol  (VENTOLIN  HFA) 108 (90 Base) MCG/ACT inhaler TAKE 2 PUFFS BY MOUTH EVERY 6 HOURS AS NEEDED FOR WHEEZE OR SHORTNESS OF BREATH   aspirin  81 MG tablet Take 81 mg by mouth daily.    benzonatate  (TESSALON ) 200 MG capsule Take 1 perle 3 x / day to prevent cough   busPIRone  (BUSPAR ) 5 MG tablet TAKE 1 TABLET BY MOUTH TWICE A DAY   Cholecalciferol (VITAMIN D3) 125 MCG (5000 UT) CAPS Takes 2 capsules (10,000 units )  Daily  CINNAMON PO Take 1,200 mg by mouth.   docusate sodium (COLACE) 100 MG capsule Take 100 mg by mouth 2 (two) times daily.   ELDERBERRY PO Take by mouth. Calcium  and vit c in this supplement   Ferrous Sulfate (IRON PO) Take 18 mg by mouth.   fluticasone  (FLONASE ) 50 MCG/ACT nasal spray USE 2 SPRAYS TO EACH NOSTRIL DAILY   fluticasone -salmeterol (WIXELA INHUB) 100-50 MCG/ACT AEPB INHALE 1 PUFF INTO THE LUNGS EVERY 12 (TWELVE) HOURS. MAKE SURE YOU RINSE YOUR  MOUTH AFTER EACH USE.   gabapentin  (NEURONTIN ) 100 MG capsule Take 1 capsule (100 mg total) by mouth 2 (two) times daily.   hydrOXYzine  (VISTARIL ) 25 MG capsule TAKE 1 CAPSULE (25 MG TOTAL) BY MOUTH AT BEDTIME AS NEEDED.   ipratropium-albuterol  (DUONEB) 0.5-2.5 (3) MG/3ML SOLN Take 3 mLs by nebulization every 4 (four) hours as needed.   montelukast  (SINGULAIR ) 10 MG tablet TAKE 1 TABLET BY MOUTH DAILY FOR ALLERGIES   omeprazole  (PRILOSEC) 40 MG capsule TAKE 1 CAPSULE BY MOUTH ONCE DAILY TO PREVENT HEARTBURN AND INDIGESTION   promethazine -dextromethorphan (PROMETHAZINE -DM) 6.25-15 MG/5ML syrup Take 5 mLs by mouth at bedtime as needed for cough.   rosuvastatin  (CRESTOR ) 5 MG tablet TAKE 1 TAB DAILY FOR CHOLESTEROL.   SODIUM FLUORIDE 5000 PPM 1.1 % PSTE Take by mouth at bedtime.   traZODone  (DESYREL ) 150 MG tablet Take 1 tablet (150 mg total) by mouth at bedtime.   Zinc  50 MG TABS Take by mouth.   phentermine  (ADIPEX-P ) 37.5 MG tablet Take  1/2 to 1 tablet  every Morning  for Dieting & Weight Loss (Patient not taking: Reported on 03/06/2023)   No current facility-administered medications on file prior to visit.    ROS: all negative except what is noted in the HPI.   Physical Exam:  BP 120/72   Pulse 87   Temp 97.8 F (36.6 C)   Ht 5' 6 (1.676 m)   Wt 196 lb 12.8 oz (89.3 kg)   SpO2 99%   BMI 31.76 kg/m   General Appearance: NAD.  Awake, conversant and cooperative. Eyes: PERRLA, EOMs intact.  Sclera white.  Conjunctiva without erythema. Sinuses: No frontal/maxillary tenderness.  No nasal discharge. Nares patent.  ENT/Mouth: Ext aud canals clear.  Bilateral TMs w/DOL and without erythema or bulging. Hearing intact.  Posterior pharynx without swelling or exudate.  Tonsils without swelling or erythema.  Neck: Supple.  No masses, nodules or thyromegaly. Respiratory: Tenderness to Left Flank. Effort is regular with non-labored breathing. Breath sounds are equal bilaterally without rales,  rhonchi, wheezing or stridor.  Cardio: RRR with no MRGs. Brisk peripheral pulses without edema.  Abdomen: Active BS in all four quadrants.  Soft and non-tender without guarding, rebound tenderness, hernias or masses. Lymphatics: Non tender without lymphadenopathy.  Musculoskeletal: Full ROM, 5/5 strength, normal ambulation.  No clubbing or cyanosis. Skin: Appropriate color for ethnicity. Warm without rashes, lesions, ecchymosis, ulcers.  Neuro: CN II-XII grossly normal. Normal muscle tone without cerebellar symptoms and intact sensation.   Psych: AO X 3,  appropriate mood and affect, insight and judgment.     BASCOM NECESSARY, NP 3:32 PM [facility]

## 2023-03-07 ENCOUNTER — Telehealth: Payer: Self-pay | Admitting: Nurse Practitioner

## 2023-03-07 LAB — URINALYSIS, ROUTINE W REFLEX MICROSCOPIC
Bacteria, UA: NONE SEEN /[HPF]
Bilirubin Urine: NEGATIVE
Glucose, UA: NEGATIVE
Hgb urine dipstick: NEGATIVE
Hyaline Cast: NONE SEEN /[LPF]
Ketones, ur: NEGATIVE
Nitrite: NEGATIVE
Protein, ur: NEGATIVE
RBC / HPF: NONE SEEN /[HPF] (ref 0–2)
Specific Gravity, Urine: 1.018 (ref 1.001–1.035)
pH: 6.5 (ref 5.0–8.0)

## 2023-03-07 LAB — URINE CULTURE
MICRO NUMBER:: 15910888
Result:: NO GROWTH
SPECIMEN QUALITY:: ADEQUATE

## 2023-03-07 LAB — MICROSCOPIC MESSAGE

## 2023-03-07 NOTE — Telephone Encounter (Signed)
 Mikeha from [employer] called to tell provider they will cover a bone density test 100%. So they will fax over this request/information.

## 2023-03-10 ENCOUNTER — Encounter: Payer: Self-pay | Admitting: Nurse Practitioner

## 2023-03-10 ENCOUNTER — Other Ambulatory Visit: Payer: Self-pay | Admitting: Nurse Practitioner

## 2023-03-10 DIAGNOSIS — N393 Stress incontinence (female) (male): Secondary | ICD-10-CM

## 2023-03-10 DIAGNOSIS — R102 Pelvic and perineal pain: Secondary | ICD-10-CM

## 2023-03-10 DIAGNOSIS — R109 Unspecified abdominal pain: Secondary | ICD-10-CM

## 2023-03-10 MED ORDER — NITROFURANTOIN MONOHYD MACRO 100 MG PO CAPS: 100.0000 mg | ORAL_CAPSULE | Freq: Two times a day (BID) | ORAL | 0 refills | Status: AC

## 2023-03-13 DIAGNOSIS — H35372 Puckering of macula, left eye: Secondary | ICD-10-CM | POA: Diagnosis not present

## 2023-03-21 ENCOUNTER — Other Ambulatory Visit: Payer: Self-pay | Admitting: Nurse Practitioner

## 2023-03-21 DIAGNOSIS — F419 Anxiety disorder, unspecified: Secondary | ICD-10-CM

## 2023-03-22 ENCOUNTER — Other Ambulatory Visit: Payer: Self-pay | Admitting: Nurse Practitioner

## 2023-03-31 ENCOUNTER — Telehealth: Payer: Self-pay | Admitting: Nurse Practitioner

## 2023-03-31 ENCOUNTER — Other Ambulatory Visit: Payer: Self-pay | Admitting: Nurse Practitioner

## 2023-03-31 ENCOUNTER — Encounter: Payer: Self-pay | Admitting: Nurse Practitioner

## 2023-03-31 DIAGNOSIS — R109 Unspecified abdominal pain: Secondary | ICD-10-CM

## 2023-03-31 NOTE — Telephone Encounter (Signed)
Patient is still having discomfort in her right side. Would like to move forward with ultrasound if possible. I advised the patient, that in the interim, if her pain has worsened since her last office visit, that we recommend that she be treated at urgent care or the ER.

## 2023-04-02 ENCOUNTER — Ambulatory Visit
Admission: RE | Admit: 2023-04-02 | Discharge: 2023-04-02 | Disposition: A | Discharge status: Home / Self Care | Payer: Medicare HMO | Source: Ambulatory Visit | Attending: Nurse Practitioner | Admitting: Nurse Practitioner

## 2023-04-02 DIAGNOSIS — R1031 Right lower quadrant pain: Secondary | ICD-10-CM | POA: Diagnosis not present

## 2023-04-02 DIAGNOSIS — R109 Unspecified abdominal pain: Secondary | ICD-10-CM

## 2023-04-03 ENCOUNTER — Encounter: Payer: Self-pay | Admitting: Nurse Practitioner

## 2023-04-25 ENCOUNTER — Encounter (HOSPITAL_COMMUNITY): Payer: Self-pay

## 2023-04-25 ENCOUNTER — Ambulatory Visit (HOSPITAL_COMMUNITY)
Admission: EM | Admit: 2023-04-25 | Discharge: 2023-04-25 | Disposition: A | Discharge status: Home / Self Care | Payer: 59 | Attending: Family Medicine | Admitting: Family Medicine

## 2023-04-25 DIAGNOSIS — R1031 Right lower quadrant pain: Secondary | ICD-10-CM | POA: Diagnosis not present

## 2023-04-25 DIAGNOSIS — R109 Unspecified abdominal pain: Secondary | ICD-10-CM | POA: Diagnosis not present

## 2023-04-25 DIAGNOSIS — J0100 Acute maxillary sinusitis, unspecified: Secondary | ICD-10-CM | POA: Diagnosis not present

## 2023-04-25 LAB — POCT URINALYSIS DIP (MANUAL ENTRY)
Bilirubin, UA: NEGATIVE
Blood, UA: NEGATIVE
Glucose, UA: NEGATIVE mg/dL
Nitrite, UA: NEGATIVE
Spec Grav, UA: 1.025 (ref 1.010–1.025)
Urobilinogen, UA: 1 U/dL
pH, UA: 5.5 (ref 5.0–8.0)

## 2023-04-25 MED ORDER — AMOXICILLIN-POT CLAVULANATE 875-125 MG PO TABS: 1.0000 | ORAL_TABLET | Freq: Two times a day (BID) | ORAL | 0 refills | Status: DC

## 2023-04-25 MED ORDER — PROMETHAZINE-DM 6.25-15 MG/5ML PO SYRP: 5.0000 mL | ORAL_SOLUTION | Freq: Four times a day (QID) | ORAL | 0 refills | Status: DC | PRN

## 2023-04-25 NOTE — ED Provider Notes (Signed)
[facility]    CSN: [id number] Arrival date & time: 04/25/23  1442      History   Chief Complaint Chief Complaint  Patient presents with   Cough   Flank Pain    right    HPI Teresa Vasquez is a 73 y.o. female.    Cough Associated symptoms: rhinorrhea   Flank Pain Associated symptoms include abdominal pain.   Patient is here for 6 weeks of symptoms.  She has pain at the right lower abdomen, and then into the right back/flank area.  This has been going on x 6 weeks.  She has frequency of urination, and has to use a pad at times due to urinary leakage.   She did see her pcp, and had a renal u/s, which was negative.  Still with symptoms. Cannot get into her new pcp until April.    She also thinks she has a sinus infection.  It is a dry cough, but then has a thick mucous at times.  Sinus congestion, drainage with sinus pressure.  These symptoms have gone on for about 4-5 weeks.         Past Medical History:  Diagnosis Date   Anxiety and depression    Broken bones 09/2022   Broken left wrist   Cholecystitis, acute with cholelithiasis 12/17/2013   Depression    Diverticulitis    DIVERTICULITIS 04/19/2009   Qualifier: Diagnosis of  By: Arlyce Dice MD, Barbette Hair    Hyperlipidemia    Hypertension    Irritable bowel syndrome     Patient Active Problem List   Diagnosis Date Noted   Positive ANA (antinuclear antibody) 08/30/2021   Osteopenia 01/05/2021   Iron deficiency anemia 09/01/2020   Insomnia due to psychological stress 04/20/2020   Plantar fasciitis of left foot 06/16/2018   GERD (gastroesophageal reflux disease) 03/06/2018   Obesity (BMI 30.0-34.9) 08/27/2017   Allergic rhinitis 03/14/2013   Vitamin D deficiency 03/14/2013   Elevated blood pressure reading without diagnosis of hypertension 03/14/2013   Other abnormal glucose (prediabetes) 03/14/2013   Mixed hyperlipidemia 03/14/2013   Anxiety 04/13/2009   Major depressive disorder in partial  remission (HCC) 04/13/2009   IRRITABLE BOWEL SYNDROME 04/13/2009    Past Surgical History:  Procedure Laterality Date   CHOLECYSTECTOMY N/A 12/17/2013   Procedure: LAPAROSCOPIC CHOLECYSTECTOMY WITH INTRAOPERATIVE CHOLANGIOGRAM;  Surgeon: Claud Kelp, MD;  Location: [facility] OR;  Service: General;  Laterality: N/A;   COLON SURGERY  2012   Diverticulosis   KNEE ARTHROSCOPY Right 2019   x 2   TONSILLECTOMY     VAGINAL HYSTERECTOMY     WRIST SURGERY Left    10/11/2022    OB History   No obstetric history on file.      Home Medications    Prior to Admission medications   Medication Sig Start Date End Date Taking? Authorizing Provider  busPIRone (BUSPAR) 5 MG tablet TAKE 1 TABLET BY MOUTH TWICE A DAY 03/21/23  Yes [name], NP  Cholecalciferol (VITAMIN D3) 125 MCG (5000 UT) CAPS Takes 2 capsules (10,000 units )  Daily 12/17/20  Yes Lucky Cowboy, MD  ELDERBERRY PO Take by mouth. Calcium and vit c in this supplement   Yes [provider]  Ferrous Sulfate (IRON PO) Take 18 mg by mouth.   Yes [provider]  fluticasone (FLONASE) 50 MCG/ACT nasal spray USE 2 SPRAYS TO EACH NOSTRIL DAILY 07/03/22  Yes [name], NP  hydrOXYzine (VISTARIL) 25 MG capsule  TAKE 1 CAPSULE (25 MG TOTAL) BY MOUTH AT BEDTIME AS NEEDED. 02/28/23  Yes [name], NP  montelukast (SINGULAIR) 10 MG tablet TAKE 1 TABLET BY MOUTH DAILY FOR ALLERGIES 02/06/23  Yes Cranford, Archie Patten, NP  omeprazole (PRILOSEC) 40 MG capsule TAKE 1 CAPSULE BY MOUTH ONCE DAILY TO PREVENT HEARTBURN AND INDIGESTION 01/10/23  Yes [name], NP  promethazine-dextromethorphan (PROMETHAZINE-DM) 6.25-15 MG/5ML syrup Take 5 mLs by mouth at bedtime as needed for cough. 01/08/23  Yes Cranford, Archie Patten, NP  SODIUM FLUORIDE 5000 PPM 1.1 % PSTE Take by mouth at bedtime. 09/15/22  Yes [provider]  traZODone (DESYREL) 150 MG tablet TAKE 1 TABLET BY MOUTH AT BEDTIME. 03/22/23  Yes [name], NP   Zinc 50 MG TABS Take by mouth.   Yes [provider]    Family History Family History  Problem Relation Age of Onset   Heart disease Mother    Hypertension Mother    Osteoporosis Mother    Heart disease Father    Heart attack Father 84   Irritable bowel syndrome Sister    Brain cancer Sister 48   Heart attack Brother 41   Heart disease Brother    Throat cancer Brother        remote smoker   Healthy Son    Cancer Paternal Aunt        oral    Social History Social History   Tobacco Use   Smoking status: Never    Passive exposure: Never   Smokeless tobacco: Never  Vaping Use   Vaping status: Never Used  Substance Use Topics   Alcohol use: No   Drug use: No     Allergies   Atorvastatin, Levofloxacin, and Prednisone   Review of Systems Review of Systems  Constitutional: Negative.   HENT:  Positive for congestion, rhinorrhea and sinus pressure.   Respiratory:  Positive for cough.   Cardiovascular: Negative.   Gastrointestinal:  Positive for abdominal pain.  Genitourinary:  Positive for flank pain.  Psychiatric/Behavioral: Negative.       Physical Exam Triage Vital Signs ED Triage Vitals  Encounter Vitals Group     BP 04/25/23 1512 117/74     Systolic BP Percentile --      Diastolic BP Percentile --      Pulse Rate 04/25/23 1512 88     Resp 04/25/23 1512 16     Temp 04/25/23 1512 98 F (36.7 C)     Temp Source 04/25/23 1512 Oral     SpO2 04/25/23 1512 96 %     Weight --      Height --      Head Circumference --      Peak Flow --      Pain Score 04/25/23 1508 0     Pain Loc --      Pain Education --      Exclude from Growth Chart --    No data found.  Updated Vital Signs BP 117/74 (BP Location: Right Arm)   Pulse 88   Temp 98 F (36.7 C) (Oral)   Resp 16   SpO2 96%   Visual Acuity Right Eye Distance:   Left Eye Distance:   Bilateral Distance:    Right Eye Near:   Left Eye Near:    Bilateral Near:     Physical  Exam Constitutional:      Appearance: Normal appearance. She is normal weight.  HENT:     Head:  Atraumatic.     Nose: Congestion and rhinorrhea present.     Right Sinus: Maxillary sinus tenderness present.     Left Sinus: Maxillary sinus tenderness present.     Mouth/Throat:     Mouth: Mucous membranes are moist.     Pharynx: Posterior oropharyngeal erythema present.  Cardiovascular:     Rate and Rhythm: Normal rate and regular rhythm.  Pulmonary:     Effort: Pulmonary effort is normal.     Breath sounds: Normal breath sounds. No wheezing or rhonchi.  Musculoskeletal:     Cervical back: Normal range of motion and neck supple. No tenderness.  Lymphadenopathy:     Cervical: No cervical adenopathy.  Skin:    General: Skin is warm.  Neurological:     General: No focal deficit present.     Mental Status: She is alert.  Psychiatric:        Mood and Affect: Mood normal.      UC Treatments / Results  Labs (all labs ordered are listed, but only abnormal results are displayed) Labs Reviewed  POCT URINALYSIS DIP (MANUAL ENTRY) - Abnormal; Notable for the following components:      Result Value   Ketones, POC UA trace (5) (*)    Protein Ur, POC trace (*)    Leukocytes, UA Trace (*)    All other components within normal limits  URINE CULTURE    EKG   Radiology No results found.  Procedures Procedures (including critical care time)  Medications Ordered in UC Medications - No data to display  Initial Impression / Assessment and Plan / UC Course  I have reviewed the triage vital signs and the nursing notes.  Pertinent labs & imaging results that were available during my care of the patient were reviewed by me and considered in my medical decision making (see chart for details).    Final Clinical Impressions(s) / UC Diagnoses   Final diagnoses:  Right flank pain  Right lower quadrant abdominal pain  Acute non-recurrent maxillary sinusitis     Discharge  Instructions      You were seen today for several issues.  I am treating you for a sinus infection with an antibiotic and cough medication.   For your abdominal/flank pain you should follow up with your primary care provider for further testing.  You may also be able to make an appointment with a urologist for further testing.  You may call Urogynecology at [phone].     ED Prescriptions     Medication Sig Dispense Auth. Provider   amoxicillin-clavulanate (AUGMENTIN) 875-125 MG tablet Take 1 tablet by mouth every 12 (twelve) hours. 14 tablet Kaylin Schellenberg, MD   promethazine-dextromethorphan (PROMETHAZINE-DM) 6.25-15 MG/5ML syrup Take 5 mLs by mouth 4 (four) times daily as needed for cough. 118 mL Jannifer Franklin, MD      PDMP not reviewed this encounter.   Jannifer Franklin, MD 04/25/23 1537

## 2023-04-25 NOTE — Discharge Instructions (Signed)
You were seen today for several issues.  I am treating you for a sinus infection with an antibiotic and cough medication.   For your abdominal/flank pain you should follow up with your primary care provider for further testing.  You may also be able to make an appointment with a urologist for further testing.  You may call Urogynecology at [phone].

## 2023-04-25 NOTE — ED Triage Notes (Signed)
Chief Complaint: sinus congestion, right flank pain, cough that is productive at times, and headache. Patient having to wear pad due to urinary leaking.   Sick exposure: No  Onset: 6 weeks   Prescriptions or OTC medications tried: Yes- Robitussin, melatonin, cough syrup    with mild relief  New foods, medications, or products: No  Recent Travel: No

## 2023-04-26 LAB — URINE CULTURE: Culture: <10000 — AB

## 2023-05-11 LAB — LAB REPORT - SCANNED
A1c: 5.5
EGFR: 73

## 2023-05-17 NOTE — Progress Notes (Unsigned)
 Teresa Vasquez, female    DOB: [birth year]   MRN: [id number]   Brief patient profile:  51 yowf  never smoker good ex tol with intermittent sinus infections/ [facility] Standing last surgery  prior to 2014  referred to pulmonary clinic 05/19/2023 by Adela Glimpse NP   for doe in setting of recurrent "deep coughing"  x 2019 (doe also  even when not actively coughing)  and ex L shoulder pain.     History of Present Illness  05/19/2023  Pulmonary/ 1st office eval/Leylani Duley singulair Chief Complaint  Patient presents with   Consult  Dyspnea:  housework / flight of steps  Cough: wakes up q noct with cough > thick white/some yellow x 4 months  Sleep: bed is flat/ 2 pillows thick cough as above x Nov 2024  SABA use: one helped but too strong:  02 KGU:RKYH  New onset L shoulder discomfort  x sev months only with exertion, resolves p 5 min at rest, no pleuritic or MSCP features     No obvious day to day or daytime pattern/variability or assoc   mucus plugs or hemoptysis or   chest tightness, subjective wheeze or overt sinus or hb symptoms.    Also denies any obvious fluctuation of symptoms with weather or environmental changes or other aggravating or alleviating factors except as outlined above   No unusual exposure hx or h/o childhood pna/ asthma or knowledge of premature birth.  Current Allergies, Complete Past Medical History, Past Surgical History, Family History, and Social History were reviewed in [employer] record.  ROS  The following are not active complaints unless bolded Hoarseness, sore throat, dysphagia, dental problems, itching, sneezing,  nasal congestion or discharge of excess mucus or purulent secretions, ear ache,   fever, chills, sweats, unintended wt loss or wt gain, classically pleuritic  cp,  orthopnea pnd or arm/hand swelling  or leg swelling, presyncope, palpitations, abdominal pain, anorexia, nausea, vomiting, diarrhea  or change in bowel habits or change in  bladder habits, change in stools or change in urine, dysuria, hematuria,  rash, arthralgias, visual complaints, headache, numbness, weakness or ataxia or problems with walking or coordination,  change in mood or  memory.             Outpatient Medications Prior to Visit  Medication Sig Dispense Refill   busPIRone (BUSPAR) 5 MG tablet TAKE 1 TABLET BY MOUTH TWICE A DAY 60 tablet 3   Cholecalciferol (VITAMIN D3) 125 MCG (5000 UT) CAPS Takes 2 capsules (10,000 units )  Daily     ELDERBERRY PO Take by mouth. Calcium and vit c in this supplement     Ferrous Sulfate (IRON PO) Take 18 mg by mouth.     fluticasone (FLONASE) 50 MCG/ACT nasal spray USE 2 SPRAYS TO EACH NOSTRIL DAILY 48 mL 3   levothyroxine (SYNTHROID) 25 MCG tablet Take 25 mcg by mouth daily before breakfast.     montelukast (SINGULAIR) 10 MG tablet TAKE 1 TABLET BY MOUTH DAILY FOR ALLERGIES 90 tablet 3   omeprazole (PRILOSEC) 40 MG capsule TAKE 1 CAPSULE BY MOUTH ONCE DAILY TO PREVENT HEARTBURN AND INDIGESTION 90 capsule 3   SODIUM FLUORIDE 5000 PPM 1.1 % PSTE Take by mouth at bedtime.     traZODone (DESYREL) 150 MG tablet TAKE 1 TABLET BY MOUTH AT BEDTIME. 30 tablet 2   Zinc 50 MG TABS Take by mouth.     amoxicillin-clavulanate (AUGMENTIN) 875-125 MG tablet Take 1 tablet by mouth every  12 (twelve) hours. (Patient not taking: Reported on 05/19/2023) 14 tablet 0   hydrOXYzine (VISTARIL) 25 MG capsule TAKE 1 CAPSULE (25 MG TOTAL) BY MOUTH AT BEDTIME AS NEEDED. (Patient not taking: Reported on 05/19/2023) 30 capsule 6   promethazine-dextromethorphan (PROMETHAZINE-DM) 6.25-15 MG/5ML syrup Take 5 mLs by mouth 4 (four) times daily as needed for cough. (Patient not taking: Reported on 05/19/2023) 118 mL 0   No facility-administered medications prior to visit.    Past Medical History:  Diagnosis Date   Anxiety and depression    Broken bones 09/2022   Broken left wrist   Cholecystitis, acute with cholelithiasis 12/17/2013   Depression     Diverticulitis    DIVERTICULITIS 04/19/2009   Qualifier: Diagnosis of  By: Arlyce Dice MD, Barbette Hair    Hyperlipidemia    Hypertension    Irritable bowel syndrome       Objective:     BP 120/80 (BP Location: Left Arm, Patient Position: Sitting, Cuff Size: Large)   Pulse 95   Temp 98 F (36.7 C) (Oral)   Ht 5\' 7"  (1.702 m)   Wt 194 lb 8 oz (88.2 kg)   SpO2 98%   BMI 30.46 kg/m   SpO2: 98 % RA   Amb wf / cough urge to cough on insp but no spont coughing   HEENT : Oropharynx  M III airway, upper dentures      Nasal turbinates mild non specific edema   NECK :  without  apparent JVD/ palpable Nodes/TM    LUNGS: no acc muscle use,  Nl contour chest which is clear to A and P bilaterally without cough on insp or exp maneuvers   CV:  RRR  no s3 or murmur or increase in P2, and no edema   ABD:  soft and nontender   MS:  Gait nl   ext warm without deformities Or obvious joint restrictions  calf tenderness, cyanosis or clubbing    SKIN: warm and dry without lesions    NEURO:  alert, approp, nl sensorium with  no motor or cerebellar deficits apparent.    CXR PA and Lateral:   05/19/2023 :    I personally reviewed images and impression is as follows:     Nl cxr    Labs form GYN ok  except for TSH > 7 one week prior to OV  > started on synthroid s subjective change in chronic cc's    Assessment   DOE (dyspnea on exertion) Onset in Nov 2024 assoc with L shoulder discomfort resolves at rest  - rx lopressor 25 mg bid and trial of symbicort 80 one bid as not sure as to source of symptoms  Based on hx and exam could have both cough variant asthma and angina pectoris (risks are  fm hx and hyperlipidemia) >>>   referred to cards with rec to try low dose symbicort for just a week then back off again   Cough variant asthma vs UACS On a background of recrurrent severe sinsus problems since her 20-30s - [facility] sinus surgery before 2014 (not in epic)  - aassoc  doe x 2019/ worse since  nove 2024  - trial of symbicort  80 1-2 bid and max ger rx  05/19/2023 >>>   Challenging hx with evidence of difficult sinus dz for years and now with cough / sob and L shoulder discomfort with ex suggestive of asthma so rec rx with low dose Beta blocker,  low doses of symbiocrt  and max for gerd then regroup in 6 weeks with cards eval in meantime.   - The proper method of use, as well as anticipated side effects, of a metered-dose inhaler were discussed and demonstrated to the patient using teach back method and an empty symbicort device.     Discussed in detail all the  indications, usual  risks and alternatives  relative to the benefits with patient who agrees to proceed with rx and w/u as outlined.     Please schedule a follow up office visit in 6 weeks, call sooner if needed with all medications /inhalers/ solutions in hand so we can verify exactly what you are taking. This includes all medications from all doctors and over the counters          Each maintenance medication was reviewed in detail including emphasizing most importantly the difference between maintenance and prns and under what circumstances the prns are to be triggered using an action plan format where appropriate.  Total time for H and P, chart review, counseling, reviewing hfa device(s) and generating customized AVS unique to this office visit / same day charting = 65 min   for multiple  refractory respiratory/chest   symptoms of uncertain etiology               Sandrea Hughs, MD 05/19/2023

## 2023-05-19 ENCOUNTER — Encounter: Payer: Self-pay | Admitting: Internal Medicine

## 2023-05-19 ENCOUNTER — Ambulatory Visit (INDEPENDENT_AMBULATORY_CARE_PROVIDER_SITE_OTHER): Payer: Medicare HMO | Admitting: Internal Medicine

## 2023-05-19 ENCOUNTER — Ambulatory Visit (INDEPENDENT_AMBULATORY_CARE_PROVIDER_SITE_OTHER)

## 2023-05-19 VITALS — BP 120/80 | HR 95 | Temp 98.0°F | Ht 67.0 in | Wt 194.5 lb

## 2023-05-19 DIAGNOSIS — R300 Dysuria: Secondary | ICD-10-CM | POA: Diagnosis not present

## 2023-05-19 DIAGNOSIS — R0609 Other forms of dyspnea: Secondary | ICD-10-CM

## 2023-05-19 DIAGNOSIS — J45991 Cough variant asthma: Secondary | ICD-10-CM

## 2023-05-19 LAB — SEDIMENTATION RATE: Sed Rate: 27 mm/h (ref 0–30)

## 2023-05-19 LAB — BRAIN NATRIURETIC PEPTIDE: Pro B Natriuretic peptide (BNP): 29 pg/mL (ref 0.0–100.0)

## 2023-05-19 MED ORDER — FAMOTIDINE 20 MG PO TABS
ORAL_TABLET | ORAL | 11 refills | Status: AC
Start: 2023-05-19 — End: ?

## 2023-05-19 MED ORDER — BUDESONIDE-FORMOTEROL FUMARATE 80-4.5 MCG/ACT IN AERO
INHALATION_SPRAY | RESPIRATORY_TRACT | 12 refills | Status: AC
Start: 2023-05-19 — End: ?

## 2023-05-19 MED ORDER — METOPROLOL TARTRATE 25 MG PO TABS: 25.0000 mg | ORAL_TABLET | Freq: Two times a day (BID) | ORAL | 11 refills | Status: AC

## 2023-05-19 NOTE — Assessment & Plan Note (Addendum)
 On a background of recrurrent severe sinsus problems since her 20-30s - [facility]eman sinus surgery before 2014 (not in epic)  - aassoc  doe x 2019/ worse since nove 2024  - trial of symbicort  80 1-2 bid and max ger rx  05/19/2023 >>>   Challenging hx with evidence of difficult sinus dz for years and now with cough / sob and L shoulder discomfort with ex suggestive of asthma so rec rx with low dose Beta blocker,  low doses of symbiocrt and max for gerd then regroup in 6 weeks with cards eval in meantime.   - The proper method of use, as well as anticipated side effects, of a metered-dose inhaler were discussed and demonstrated to the patient using teach back method and an empty symbicort device.     Discussed in detail all the  indications, usual  risks and alternatives  relative to the benefits with patient who agrees to proceed with rx and w/u as outlined.     Please schedule a follow up office visit in 6 weeks, call sooner if needed with all medications /inhalers/ solutions in hand so we can verify exactly what you are taking. This includes all medications from all doctors and over the counters          Each maintenance medication was reviewed in detail including emphasizing most importantly the difference between maintenance and prns and under what circumstances the prns are to be triggered using an action plan format where appropriate.  Total time for H and P, chart review, counseling, reviewing hfa device(s) and generating customized AVS unique to this office visit / same day charting = 65 min   for multiple  refractory respiratory/chest   symptoms of uncertain etiology

## 2023-05-19 NOTE — Assessment & Plan Note (Addendum)
 Onset in Nov 2024 assoc with L shoulder discomfort resolves at rest  - rx lopressor 25 mg bid and trial of symbicort 80 one bid as not sure as to source of symptoms  Based on hx and exam could have both cough variant asthma and angina pectoris (risks are  fm hx and hyperlipidemia) >>>   referred to cards with rec to try low dose symbicort for just a week then back off again

## 2023-05-19 NOTE — Patient Instructions (Addendum)
 Symbicort 80 one twice daily x for  a full week then stop if not convinced  it's working  Work on inhaler technique:  relax and gently blow all the way out then take a nice smooth full deep breath back in, triggering the inhaler at same time you start breathing in.  Hold breath in for at least  5 seconds if you can. Blow out symbicort thru nose. Rinse and gargle with water when done.  If mouth or throat bother you at all,  try brushing teeth/gums/tongue with arm and hammer toothpaste/ make a slurry and gargle and spit out.   GERD (REFLUX)  is an extremely common cause of respiratory symptoms just like yours , many times with no obvious heartburn at all.    It can be treated with medication, but also with lifestyle changes including elevation of the head of your bed (ideally with 6 -8inch blocks under the headboard of your bed),  Smoking cessation, avoidance of late meals, excessive alcohol, and avoid fatty foods, chocolate, peppermint, colas, red wine, and acidic juices such as orange juice.  NO MINT OR MENTHOL PRODUCTS SO NO COUGH DROPS - Ludens ok  USE SUGARLESS CANDY INSTEAD (Jolley ranchers or Stover[employer] or Environmental manager) or even ice chips will also do - the key is to swallow to prevent all throat clearing. NO OIL BASED VITAMINS - use powdered substitutes.  Avoid fish oil when coughing.  Omeprazole 40 mg   Take  30-60 min before first meal of the day and Pepcid (famotidine)  20 mg after supper until return to office - this is the best way to tell whether stomach acid is contributing to your problem.    Please schedule a follow up office visit in 6 weeks, call sooner if needed with all medications /inhalers/ solutions in hand so we can verify exactly what you are taking. This includes all medications from all doctors and over the counters

## 2023-05-20 LAB — IGE: IgE (Immunoglobulin E), Serum: 33 kU/L (ref <=114)

## 2023-05-20 LAB — D-DIMER, QUANTITATIVE: D-Dimer, Quant: 0.72 ug{FEU}/mL — ABNORMAL HIGH (ref <0.50)

## 2023-05-21 ENCOUNTER — Encounter: Payer: Self-pay | Admitting: *Deleted

## 2023-05-21 DIAGNOSIS — H35372 Puckering of macula, left eye: Secondary | ICD-10-CM | POA: Diagnosis not present

## 2023-05-21 DIAGNOSIS — H04123 Dry eye syndrome of bilateral lacrimal glands: Secondary | ICD-10-CM | POA: Diagnosis not present

## 2023-05-21 DIAGNOSIS — Z961 Presence of intraocular lens: Secondary | ICD-10-CM | POA: Diagnosis not present

## 2023-05-29 ENCOUNTER — Ambulatory Visit: Admitting: Cardiology

## 2023-06-02 ENCOUNTER — Encounter: Payer: Self-pay | Admitting: *Deleted

## 2023-06-03 ENCOUNTER — Encounter: Payer: Medicare HMO | Admitting: Internal Medicine

## 2023-06-16 ENCOUNTER — Ambulatory Visit (INDEPENDENT_AMBULATORY_CARE_PROVIDER_SITE_OTHER): Payer: Medicare HMO | Admitting: Family Medicine

## 2023-06-16 ENCOUNTER — Encounter: Payer: Self-pay | Admitting: Family Medicine

## 2023-06-16 VITALS — BP 116/70 | HR 87 | Temp 97.8°F | Ht 67.0 in | Wt 195.2 lb

## 2023-06-16 DIAGNOSIS — D508 Other iron deficiency anemias: Secondary | ICD-10-CM | POA: Diagnosis not present

## 2023-06-16 DIAGNOSIS — K2100 Gastro-esophageal reflux disease with esophagitis, without bleeding: Secondary | ICD-10-CM | POA: Diagnosis not present

## 2023-06-16 DIAGNOSIS — F5101 Primary insomnia: Secondary | ICD-10-CM | POA: Diagnosis not present

## 2023-06-16 DIAGNOSIS — E039 Hypothyroidism, unspecified: Secondary | ICD-10-CM

## 2023-06-16 DIAGNOSIS — E66811 Obesity, class 1: Secondary | ICD-10-CM

## 2023-06-16 DIAGNOSIS — F411 Generalized anxiety disorder: Secondary | ICD-10-CM

## 2023-06-16 DIAGNOSIS — E6609 Other obesity due to excess calories: Secondary | ICD-10-CM | POA: Diagnosis not present

## 2023-06-16 DIAGNOSIS — R051 Acute cough: Secondary | ICD-10-CM

## 2023-06-16 DIAGNOSIS — Z6830 Body mass index (BMI) 30.0-30.9, adult: Secondary | ICD-10-CM | POA: Diagnosis not present

## 2023-06-16 DIAGNOSIS — I10 Essential (primary) hypertension: Secondary | ICD-10-CM

## 2023-06-16 DIAGNOSIS — Z79899 Other long term (current) drug therapy: Secondary | ICD-10-CM

## 2023-06-16 MED ORDER — QUETIAPINE FUMARATE 200 MG PO TABS: 200.0000 mg | ORAL_TABLET | Freq: Every day | ORAL | 1 refills | Status: DC

## 2023-06-16 MED ORDER — PROMETHAZINE-DM 6.25-15 MG/5ML PO SYRP: 5.0000 mL | ORAL_SOLUTION | Freq: Four times a day (QID) | ORAL | 0 refills | Status: DC | PRN

## 2023-06-16 NOTE — Assessment & Plan Note (Signed)
 Continue levothyroxine 25 mcg, TSH ordered for 07/02/2023

## 2023-06-16 NOTE — Patient Instructions (Addendum)
 Welcome to [facility]!  Thank you for choosing us  for your Primary Care needs.   We offer in person and video appointments for your convenience.   You may call our office to schedule appointments, or you may schedule appointments with me through MyChart.   The best way to get in contact with me is via MyChart message.   This will get to me faster than a phone call, unless there is an emergency, then please call 911.  The lab is located downstairs in the Sports Medicine building, we also have xray available there.   I'd like you to come by and have thyroid labs drawn on 07/02/23 or after.   Follow up with me in about August for medication management.

## 2023-06-16 NOTE — Assessment & Plan Note (Signed)
 Discussed healthy diet and activity level, continue efforts

## 2023-06-16 NOTE — Assessment & Plan Note (Signed)
 Promethazine dextromethorphan cough syrup sent to pharmacy May continue Singulair, inhaler as needed, Flonase, antihistamines

## 2023-06-16 NOTE — Assessment & Plan Note (Signed)
Controlled, continue metoprolol

## 2023-06-16 NOTE — Assessment & Plan Note (Signed)
 Will check labs at next visit, continue ferrous sulfate

## 2023-06-16 NOTE — Progress Notes (Signed)
 New Patient Office Visit  Subjective    Patient ID: Teresa Vasquez, female    DOB: 05-30-1950  Age: 73 y.o. MRN: [id number]  CC: No chief complaint on file.   HPI Teresa Vasquez presents to establish care today. Reports trouble sleeping. Has trazodone, does not like the way this makes her feel, has not been taking this. States has been takes Seroquel 300 mg, has been taking half of his Seroquel at night with benefit. Requesting refill of promethazine dextromethorphan cough syrup for recent increase in allergy symptoms and waking up having coughing fits of the night. Has recently started taking metoprolol and levothyroxine from OB/GYN as well as oxybutynin for overactive bladder. Does not have follow-up scheduled at this time. Reports compliance with medication regimen.  Denies other concerns today. Medical history as outlined below.  Outpatient Encounter Medications as of 06/16/2023  Medication Sig   budesonide-formoterol (SYMBICORT) 80-4.5 MCG/ACT inhaler Take 1- 2 puffs first thing in am and then another 2 puffs about 12 hours later. (Patient taking differently: Inhale 1-2 puffs into the lungs See admin instructions. Take 1- 2 puffs first thing in am and then another 2 puffs about 12 hours later.)   busPIRone (BUSPAR) 5 MG tablet TAKE 1 TABLET BY MOUTH TWICE A DAY   Cholecalciferol (VITAMIN D3) 125 MCG (5000 UT) CAPS Takes 2 capsules (10,000 units )  Daily (Patient taking differently: Takes 2 capsules (5,000 units )  Daily)   ELDERBERRY PO Take 1 tablet by mouth daily. Calcium and vit c in this supplement   famotidine (PEPCID) 20 MG tablet One after supper (Patient taking differently: Take 20 mg by mouth daily. One after supper)   Ferrous Sulfate (IRON PO) Take 18 mg by mouth daily.   fluticasone (FLONASE) 50 MCG/ACT nasal spray USE 2 SPRAYS TO EACH NOSTRIL DAILY (Patient taking differently: Place 2 sprays into both nostrils daily. Use  2 sprays  to each Nostril  Daily)    levothyroxine (SYNTHROID) 25 MCG tablet Take 25 mcg by mouth daily before breakfast.   metoprolol tartrate (LOPRESSOR) 25 MG tablet Take 1 tablet (25 mg total) by mouth 2 (two) times daily.   montelukast (SINGULAIR) 10 MG tablet TAKE 1 TABLET BY MOUTH DAILY FOR ALLERGIES (Patient taking differently: Take 10 mg by mouth See admin instructions. TAKE 1 TABLET BY MOUTH DAILY FOR ALLERGIES)   omeprazole (PRILOSEC) 40 MG capsule TAKE 1 CAPSULE BY MOUTH ONCE DAILY TO PREVENT HEARTBURN AND INDIGESTION   oxybutynin (DITROPAN-XL) 10 MG 24 hr tablet Take 10 mg by mouth at bedtime.   promethazine-dextromethorphan (PROMETHAZINE-DM) 6.25-15 MG/5ML syrup Take 5 mLs by mouth 4 (four) times daily as needed for cough.   QUEtiapine (SEROQUEL) 200 MG tablet Take 1 tablet (200 mg total) by mouth at bedtime.   SODIUM FLUORIDE 5000 PPM 1.1 % PSTE Take 1 application  by mouth at bedtime.   Zinc 50 MG TABS Take 1 tablet by mouth daily.   [DISCONTINUED] traZODone (DESYREL) 150 MG tablet TAKE 1 TABLET BY MOUTH AT BEDTIME. (Patient not taking: Reported on 06/16/2023)   No facility-administered encounter medications on file as of 06/16/2023.    Past Medical History:  Diagnosis Date   Anxiety and depression    Broken bones 09/2022   Broken left wrist   Cholecystitis, acute with cholelithiasis 12/17/2013   Depression    Diverticulitis    DIVERTICULITIS 04/19/2009   Qualifier: Diagnosis of  By: Arlyce Dice MD, Barbette Hair    Hyperlipidemia  Hypertension    Irritable bowel syndrome     Past Surgical History:  Procedure Laterality Date   CHOLECYSTECTOMY N/A 12/17/2013   Procedure: LAPAROSCOPIC CHOLECYSTECTOMY WITH INTRAOPERATIVE CHOLANGIOGRAM;  Surgeon: Claud Kelp, MD;  Location: [facility] OR;  Service: General;  Laterality: N/A;   COLON SURGERY  2012   Diverticulosis   KNEE ARTHROSCOPY Right 2019   x 2   TONSILLECTOMY     VAGINAL HYSTERECTOMY     WRIST SURGERY Left    10/11/2022    Family History  Problem Relation  Age of Onset   Heart disease Mother    Hypertension Mother    Osteoporosis Mother    Heart disease Father    Heart attack Father 75   Irritable bowel syndrome Sister    Brain cancer Sister 18   Heart attack Brother 55   Heart disease Brother    Throat cancer Brother        remote smoker   Healthy Son    Cancer Paternal Aunt        oral    Social History   Socioeconomic History   Marital status: Married    Spouse name: Not on file   Number of children: 1   Years of education: Not on file   Highest education level: Not on file  Occupational History   Occupation: Geophysicist/field seismologist: [employer]   Occupation: Retired    Associate Professor: Advice worker [employer]  Tobacco Use   Smoking status: Never    Passive exposure: Never   Smokeless tobacco: Never  Vaping Use   Vaping status: Never Used  Substance and Sexual Activity   Alcohol use: No   Drug use: No   Sexual activity: Yes    Partners: Male    Birth control/protection: Post-menopausal  Other Topics Concern   Not on file  Social History Narrative   Daily caffiene use.  Coke or tea 2-3 daily   Education: HS grad   Work: retired (worked Publishing rights manager).    Poll work.    Social Drivers of Corporate investment banker Strain: Not on file  Food Insecurity: Low Risk  (08/21/2022)   [facility]   Hunger Vital Sign    Worried About Running Out of Food in the Last Year: Never true    Ran Out of Food in the Last Year: Never true  Transportation Needs: No Transportation Needs (08/21/2022)   [facility]   Transportation    In the past 12 months, has lack of reliable transportation kept you from medical appointments, meetings, work or from getting things needed for daily living? : No  Physical Activity: Inactive (03/09/2018)   Exercise Vital Sign    Days of Exercise per Week: 0 days    Minutes of Exercise per Session: 0 min  Stress: No Stress Concern  Present (03/09/2018)   [employer] of Occupational Health - Occupational Stress Questionnaire    Feeling of Stress : Only a little  Social Connections: Not on file  Intimate Partner Violence: Not on file    ROS Per HPI      Objective    BP 116/70 (BP Location: Left Arm, Patient Position: Sitting)   Pulse 87   Temp 97.8 F (36.6 C) (Temporal)   Ht 5\' 7"  (1.702 m)   Wt 195 lb 3.2 oz (88.5 kg)   SpO2 96%   BMI 30.57 kg/m   Physical Exam Vitals and  nursing note reviewed.  Constitutional:      General: She is not in acute distress.    Appearance: Normal appearance. She is obese.  HENT:     Head: Normocephalic and atraumatic.     Right Ear: External ear normal.     Left Ear: External ear normal.     Nose: Nose normal.  Eyes:     Extraocular Movements: Extraocular movements intact.  Neck:     Vascular: No carotid bruit.  Cardiovascular:     Rate and Rhythm: Normal rate and regular rhythm.     Pulses: Normal pulses.     Heart sounds: Normal heart sounds. No murmur heard. Pulmonary:     Effort: Pulmonary effort is normal. No respiratory distress.     Breath sounds: Normal breath sounds. No wheezing, rhonchi or rales.  Musculoskeletal:        General: Normal range of motion.     Cervical back: Normal range of motion.     Right lower leg: No edema.     Left lower leg: No edema.  Lymphadenopathy:     Cervical: No cervical adenopathy.  Neurological:     General: No focal deficit present.     Mental Status: She is alert and oriented to person, place, and time.  Psychiatric:        Mood and Affect: Mood normal.        Thought Content: Thought content normal.        Assessment & Plan:   Primary hypertension Assessment & Plan: Controlled, continue metoprolol   Acquired hypothyroidism Assessment & Plan: Continue levothyroxine 25 mcg, TSH ordered for 07/02/2023  Orders: -     TSH; Future  Other iron deficiency anemia Assessment & Plan: Will check labs  at next visit, continue ferrous sulfate   GAD (generalized anxiety disorder) Assessment & Plan: Continue buspirone Stop trazodone, start Seroquel at bedtime   Gastroesophageal reflux disease with esophagitis without hemorrhage Assessment & Plan: Controlled, continue famotidine   Primary insomnia Assessment & Plan: Stop trazodone, start Seroquel 200 mg once daily at bedtime  Orders: -     QUEtiapine Fumarate; Take 1 tablet (200 mg total) by mouth at bedtime.  Dispense: 30 tablet; Refill: 1  Acute cough Assessment & Plan: Promethazine dextromethorphan cough syrup sent to pharmacy May continue Singulair, inhaler as needed, Flonase, antihistamines  Orders: -     Promethazine-DM; Take 5 mLs by mouth 4 (four) times daily as needed for cough.  Dispense: 118 mL; Refill: 0  Class 1 obesity due to excess calories with serious comorbidity and body mass index (BMI) of 30.0 to 30.9 in adult Assessment & Plan: Discussed healthy diet and activity level, continue efforts   Medication management Assessment & Plan: Continue current medication regimen Get thyroid labs drawn at the end of this month Follow-up in about August for medication management and labs  Orders: -     QUEtiapine Fumarate; Take 1 tablet (200 mg total) by mouth at bedtime.  Dispense: 30 tablet; Refill: 1 -     TSH; Future     Return in about 16 weeks (around 10/06/2023) for med mgt.   Wellington Half, FNP

## 2023-06-16 NOTE — Assessment & Plan Note (Signed)
 Continue buspirone Stop trazodone, start Seroquel at bedtime

## 2023-06-16 NOTE — Assessment & Plan Note (Signed)
 Controlled, continue famotidine

## 2023-06-16 NOTE — Assessment & Plan Note (Signed)
 Continue current medication regimen Get thyroid labs drawn at the end of this month Follow-up in about August for medication management and labs

## 2023-06-16 NOTE — Assessment & Plan Note (Signed)
 Stop trazodone, start Seroquel 200 mg once daily at bedtime

## 2023-06-19 ENCOUNTER — Encounter: Payer: Medicare HMO | Admitting: Nurse Practitioner

## 2023-07-11 DIAGNOSIS — J0100 Acute maxillary sinusitis, unspecified: Secondary | ICD-10-CM | POA: Diagnosis not present

## 2023-07-16 NOTE — Progress Notes (Unsigned)
 Teresa Vasquez, female    DOB: 1950-05-07   MRN: [id number]   Brief patient profile:  22 yowf  never smoker good ex tol with intermittent sinus infections/ [place] last surgery  prior to 2014  referred to pulmonary clinic 05/19/2023 by Langley Pippin NP   for doe in setting of recurrent "deep coughing"  x 2019 (doe also  even when not actively coughing)  and ex L shoulder pain.     History of Present Illness  05/19/2023  Pulmonary/ 1st office eval/Aneta Hendershott singulair  Chief Complaint  Patient presents with   Consult  Dyspnea:  housework / flight of steps  Cough: wakes up q noct with cough > thick white/some yellow x 4 months  Sleep: bed is flat/ 2 pillows thick cough as above x Nov 2024  SABA use: one helped but too strong:  02 XBM:WUXL  New onset L shoulder discomfort  x sev months only with exertion, resolves p 5 min at rest, no pleuritic or MSCP features  Rec Symbicort  80 one twice daily x for a full week then stop if not convinced  it's working Work on inhaler technique:  GERD diet reviewed, bed blocks rec  Omeprazole  40 mg   Take  30-60 min before first meal of the day and Pepcid  (famotidine )  20 mg after supper until return to office   Please schedule a follow up office visit in 6 weeks, call sooner if needed with all medications /inhalers/ solutions in hand      07/17/2023  f/u ov/Sheza Strickland re: ? Cough variant asthma    maint on symbicort    did not bring meds  Chief Complaint  Patient presents with   Follow-up    Dyspnea f/u   Dyspnea:  no change off symbicort   Cough: not a concern  Sleeping: flat bed 2pillows s noct resp cc  SABA use: none  02: none       No obvious day to day or daytime variability or assoc excess/ purulent sputum or mucus plugs or hemoptysis or cp or chest tightness, subjective wheeze or overt sinus or hb symptoms.    Also denies any obvious fluctuation of symptoms with weather or environmental changes or other aggravating or alleviating factors except as  outlined above   No unusual exposure hx or h/o childhood pna/ asthma or knowledge of premature birth.  Current Allergies, Complete Past Medical History, Past Surgical History, Family History, and Social History were reviewed in [employer] record.  ROS  The following are not active complaints unless bolded Hoarseness, sore throat, dysphagia, dental problems, itching, sneezing,  nasal congestion or discharge of excess mucus or purulent secretions, ear ache,   fever, chills, sweats, unintended wt loss or wt gain, classically pleuritic or exertional cp,  orthopnea pnd or arm/hand swelling  or leg swelling, presyncope, palpitations, abdominal pain, anorexia, nausea, vomiting, diarrhea  or change in bowel habits or change in bladder habits, change in stools or change in urine, dysuria, hematuria,  rash, arthralgias, visual complaints, headache, numbness, weakness or ataxia or problems with walking or coordination,  change in mood or  memory. Vertigo new onset 07/15/23         Current Meds  Medication Sig   amoxicillin -clavulanate (AUGMENTIN ) 875-125 MG tablet Take 1 tablet by mouth 2 (two) times daily.   budesonide -formoterol  (SYMBICORT ) 80-4.5 MCG/ACT inhaler Take 1- 2 puffs first thing in am and then another 2 puffs about 12 hours later. (Patient taking differently: Inhale 1-2 puffs into  the lungs See admin instructions. Take 1- 2 puffs first thing in am and then another 2 puffs about 12 hours later.)   busPIRone  (BUSPAR ) 5 MG tablet TAKE 1 TABLET BY MOUTH TWICE A DAY   Cholecalciferol (VITAMIN D3) 125 MCG (5000 UT) CAPS Takes 2 capsules (10,000 units )  Daily (Patient taking differently: Takes 2 capsules (5,000 units )  Daily)   ELDERBERRY PO Take 1 tablet by mouth daily. Calcium  and vit c in this supplement   famotidine  (PEPCID ) 20 MG tablet One after supper (Patient taking differently: Take 20 mg by mouth daily. One after supper)   Ferrous Sulfate (IRON PO) Take 18 mg by mouth  daily.   fluticasone  (FLONASE ) 50 MCG/ACT nasal spray USE 2 SPRAYS TO EACH NOSTRIL DAILY (Patient taking differently: Place 2 sprays into both nostrils daily. Use  2 sprays  to each Nostril  Daily)   levothyroxine (SYNTHROID) 25 MCG tablet Take 25 mcg by mouth daily before breakfast.   metoprolol  tartrate (LOPRESSOR ) 25 MG tablet Take 1 tablet (25 mg total) by mouth 2 (two) times daily.   montelukast  (SINGULAIR ) 10 MG tablet TAKE 1 TABLET BY MOUTH DAILY FOR ALLERGIES (Patient taking differently: Take 10 mg by mouth See admin instructions. TAKE 1 TABLET BY MOUTH DAILY FOR ALLERGIES)   omeprazole  (PRILOSEC) 40 MG capsule TAKE 1 CAPSULE BY MOUTH ONCE DAILY TO PREVENT HEARTBURN AND INDIGESTION   oxybutynin (DITROPAN-XL) 10 MG 24 hr tablet Take 10 mg by mouth at bedtime.   promethazine -dextromethorphan (PROMETHAZINE -DM) 6.25-15 MG/5ML syrup Take 5 mLs by mouth 4 (four) times daily as needed for cough.   QUEtiapine  (SEROQUEL ) 200 MG tablet Take 1 tablet (200 mg total) by mouth at bedtime.   SODIUM FLUORIDE 5000 PPM 1.1 % PSTE Take 1 application  by mouth at bedtime.   Zinc 50 MG TABS Take 1 tablet by mouth daily.          Past Medical History:  Diagnosis Date   Anxiety and depression    Broken bones 09/2022   Broken left wrist   Cholecystitis, acute with cholelithiasis 12/17/2013   Depression    Diverticulitis    DIVERTICULITIS 04/19/2009   Qualifier: Diagnosis of  By: Arvie Latus MD, Moishe Angel    Hyperlipidemia    Hypertension    Irritable bowel syndrome       Objective:    Wt Readings from Last 3 Encounters:  07/17/23 195 lb 12.8 oz (88.8 kg)  06/16/23 195 lb 3.2 oz (88.5 kg)  05/19/23 194 lb 8 oz (88.2 kg)     Vital signs reviewed  07/17/2023  - Note at rest 02 sats  97% on RA   General appearance:    amb wf nad    HEENT : Oropharynx  clear      Nasal turbinates nl   NECK :  without  apparent JVD/ palpable Nodes/TM    LUNGS: no acc muscle use,  Nl contour chest which is clear  to A and P bilaterally without cough on insp or exp maneuvers   CV:  RRR  no s3 or murmur or increase in P2, and no edema   ABD:  soft and nontender   MS:  Gait nl  ext warm without deformities Or obvious joint restrictions  calf tenderness, cyanosis or clubbing    SKIN: warm and dry without lesions    NEURO:  alert, approp, nl sensorium with  no motor or cerebellar deficits apparent- no nystagmus but positional testing not  done          Assessment

## 2023-07-17 ENCOUNTER — Ambulatory Visit (INDEPENDENT_AMBULATORY_CARE_PROVIDER_SITE_OTHER): Admitting: Internal Medicine

## 2023-07-17 ENCOUNTER — Encounter: Payer: Self-pay | Admitting: Internal Medicine

## 2023-07-17 VITALS — BP 128/62 | HR 99 | Ht 67.0 in | Wt 195.8 lb

## 2023-07-17 DIAGNOSIS — H8110 Benign paroxysmal vertigo, unspecified ear: Secondary | ICD-10-CM

## 2023-07-17 DIAGNOSIS — J45991 Cough variant asthma: Secondary | ICD-10-CM

## 2023-07-17 DIAGNOSIS — R0600 Dyspnea, unspecified: Secondary | ICD-10-CM | POA: Diagnosis not present

## 2023-07-17 MED ORDER — MECLIZINE HCL 12.5 MG PO TABS: 12.5000 mg | ORAL_TABLET | Freq: Three times a day (TID) | ORAL | 2 refills | Status: DC | PRN

## 2023-07-17 NOTE — Patient Instructions (Addendum)
 Symbicort   80 can be up to 2 puffs every 12 hours as needed   Meclizine 12.5 mg every 4 hours as needed    If you are satisfied with your treatment plan,  let your doctor know and he/she can either refill your medications or you can return here when your prescription runs out.     If in any way you are not 100% satisfied,  please tell us .  If 100% better, tell your friends!  Pulmonary follow up is as needed

## 2023-07-18 DIAGNOSIS — H8110 Benign paroxysmal vertigo, unspecified ear: Secondary | ICD-10-CM | POA: Insufficient documentation

## 2023-07-18 NOTE — Assessment & Plan Note (Addendum)
 Onset 07/15/23 > rec meclizine 12.5 mg prn > f/u PCP  or ENT prn

## 2023-07-18 NOTE — Assessment & Plan Note (Addendum)
 On a background of recrurrent severe sinsus problems since her 20-30s - [place] sinus surgery before 2014 (not in epic)  - assoc  doe x 2019/ worse since nove 2024  -  Allergy screen   05/19/23  >  Eos 0.2 /  IgE 33 - trial of symbicort   80 1-2 bid and max ger rx  05/19/2023 >>> resolved as of 07/17/2023   Rec: continue symbicort  80 prn Based on two studies from NEJM  378; 20 p 1865 (2018) and 380 : p2020-30 (2019) in pts with mild asthma it is reasonable to use low dose symbicort  eg 80 2bid "prn" flare in this setting but I emphasized this was only shown with symbicort  and takes advantage of the rapid onset of action but is not the same as "rescue therapy" but can be stopped once the acute symptoms have resolved and the need for rescue has been minimized (< 2 x weekly)    Pulmonary f/u is prn          Each maintenance medication was reviewed in detail including emphasizing most importantly the difference between maintenance and prns and under what circumstances the prns are to be triggered using an action plan format where appropriate.  Total time for H and P, chart review, counseling, reviewing hfa  device(s) and generating customized AVS unique to this office visit / same day charting = 20 min summary final f/u ov

## 2023-07-21 ENCOUNTER — Encounter: Payer: Self-pay | Admitting: Family Medicine

## 2023-07-22 NOTE — Progress Notes (Unsigned)
   Established Patient Office Visit  Subjective   Patient ID: Teresa Vasquez, female    DOB: April 16, 1950  Age: 73 y.o. MRN: [id number]  No chief complaint on file.   HPI Patient presents today for medication management. Would like to discuss restarting phentermine . Has used this in the past with good weight loss result and without intolerable side effects. Not fasting today. Denies other concerns. Medical history as outlined below.  ROS Per HPI    Objective:     There were no vitals taken for this visit.  Physical Exam Vitals and nursing note reviewed.  Constitutional:      General: She is not in acute distress.    Appearance: Normal appearance. She is obese.  HENT:     Head: Normocephalic and atraumatic.     Right Ear: External ear normal.     Left Ear: External ear normal.     Nose: Nose normal.  Eyes:     Extraocular Movements: Extraocular movements intact.  Neck:     Vascular: No carotid bruit.  Cardiovascular:     Rate and Rhythm: Normal rate and regular rhythm.     Pulses: Normal pulses.     Heart sounds: Normal heart sounds. No murmur heard. Pulmonary:     Effort: Pulmonary effort is normal. No respiratory distress.     Breath sounds: Normal breath sounds. No wheezing, rhonchi or rales.  Musculoskeletal:        General: Normal range of motion.     Cervical back: Normal range of motion.     Right lower leg: No edema.     Left lower leg: No edema.  Lymphadenopathy:     Cervical: No cervical adenopathy.  Neurological:     General: No focal deficit present.     Mental Status: She is alert and oriented to person, place, and time.  Psychiatric:        Mood and Affect: Mood normal.        Thought Content: Thought content normal.    No results found for any visits on 07/23/23.   The 10-year ASCVD risk score (Arnett DK, et al., 2019) is: 14.2%    Assessment & Plan:   There are no diagnoses linked to this encounter.   No follow-ups on file.     Wellington Half, FNP

## 2023-07-23 ENCOUNTER — Ambulatory Visit (INDEPENDENT_AMBULATORY_CARE_PROVIDER_SITE_OTHER): Admitting: Family Medicine

## 2023-07-23 ENCOUNTER — Encounter: Payer: Self-pay | Admitting: Family Medicine

## 2023-07-23 VITALS — BP 122/82 | HR 100 | Temp 98.0°F | Ht 67.0 in | Wt 194.6 lb

## 2023-07-23 DIAGNOSIS — H6593 Unspecified nonsuppurative otitis media, bilateral: Secondary | ICD-10-CM

## 2023-07-23 DIAGNOSIS — E6609 Other obesity due to excess calories: Secondary | ICD-10-CM | POA: Diagnosis not present

## 2023-07-23 DIAGNOSIS — E66811 Obesity, class 1: Secondary | ICD-10-CM | POA: Diagnosis not present

## 2023-07-23 DIAGNOSIS — Z6830 Body mass index (BMI) 30.0-30.9, adult: Secondary | ICD-10-CM | POA: Diagnosis not present

## 2023-07-23 MED ORDER — PHENTERMINE HCL 37.5 MG PO CAPS: 37.5000 mg | ORAL_CAPSULE | ORAL | 3 refills | Status: DC

## 2023-07-23 MED ORDER — TOPIRAMATE 50 MG PO TABS: 50.0000 mg | ORAL_TABLET | Freq: Every day | ORAL | 3 refills | Status: DC

## 2023-07-23 MED ORDER — PHENTERMINE HCL 37.5 MG PO CAPS
37.5000 mg | ORAL_CAPSULE | ORAL | 3 refills | Status: DC
Start: 2023-07-23 — End: 2023-07-23

## 2023-07-23 MED ORDER — METHYLPREDNISOLONE ACETATE 40 MG/ML IJ SUSP
40.0000 mg | Freq: Once | INTRAMUSCULAR | Status: AC
  Administered 2023-07-23: 40 mg via INTRAMUSCULAR

## 2023-07-27 ENCOUNTER — Other Ambulatory Visit: Payer: Self-pay | Admitting: Podiatry

## 2023-07-29 ENCOUNTER — Ambulatory Visit: Payer: Self-pay

## 2023-07-29 NOTE — Telephone Encounter (Signed)
 Copied from CRM [id number]. Topic: Clinical - Red Word Triage >> Jul 29, 2023  2:50 PM [place] J wrote: [facility] that prompted transfer to Nurse Triage: Patient is calling back regarding a conversation she was having with the medical assistant about worsening itching from her wrist to her elbow and the patient thinks it could be a possible allergic reaction. She was instructed to schedule an appointment.   Chief Complaint: Arm itchiness Symptoms: Itchiness of the right forearm, headache, chills Frequency: Intermittent  Disposition: [] ED /[] Urgent Care (no appt availability in office) / [x] Appointment(In office/virtual)/ []  [facility]/ [] Home Care/ [] Refused Recommended Disposition /[] [facility]/ []  Follow-up with PCP Additional Notes: Patient reports itchiness to the top of her right forearm that began 5-6 days ago. She states that the itchiness is worsening and wakes her up at night. She states that she is also experiencing some burning/stinging pain with the itchiness. Appointment made for the patient tomorrow for evaluation. Patient instructed to call back for new or worsening symptoms. Patient verbalized understanding and agreement with this plan.     Reason for Disposition  [1] MODERATE-SEVERE local itching (i.e., interferes with work, school, activities) AND [2] not improved after 24 hours of hydrocortisone  cream  Answer Assessment - Initial Assessment Questions 1. DESCRIPTION: "Describe the itching you are having." "Where is it located?"     Itching to right forearm  2. SEVERITY: "How bad is it?"    - MILD: Doesn't interfere with normal activities.   - MODERATE-SEVERE: Interferes with work, school, sleep, or other activities.      Moderate to severe  3. SCRATCHING: "Are there any scratch marks? Bleeding?"     No 4. ONSET: "When did the itching begin?"      6 days ago  5. CAUSE: "What do you think is causing the itching?"      Unsure  6. OTHER SYMPTOMS: "Do you  have any other symptoms?"      Chills, headache  Protocols used: Itching - Localized-A-AH

## 2023-07-30 ENCOUNTER — Encounter: Payer: Self-pay | Admitting: Family Medicine

## 2023-07-30 ENCOUNTER — Ambulatory Visit: Payer: Self-pay | Admitting: Family Medicine

## 2023-07-30 ENCOUNTER — Ambulatory Visit (INDEPENDENT_AMBULATORY_CARE_PROVIDER_SITE_OTHER): Admitting: Family Medicine

## 2023-07-30 VITALS — BP 122/80 | HR 96 | Temp 98.2°F | Ht 67.0 in | Wt 196.0 lb

## 2023-07-30 DIAGNOSIS — R21 Rash and other nonspecific skin eruption: Secondary | ICD-10-CM | POA: Insufficient documentation

## 2023-07-30 DIAGNOSIS — E039 Hypothyroidism, unspecified: Secondary | ICD-10-CM

## 2023-07-30 DIAGNOSIS — L299 Pruritus, unspecified: Secondary | ICD-10-CM | POA: Insufficient documentation

## 2023-07-30 DIAGNOSIS — Z0100 Encounter for examination of eyes and vision without abnormal findings: Secondary | ICD-10-CM | POA: Diagnosis not present

## 2023-07-30 DIAGNOSIS — R233 Spontaneous ecchymoses: Secondary | ICD-10-CM | POA: Insufficient documentation

## 2023-07-30 LAB — CBC WITH DIFFERENTIAL/PLATELET
Basophils Absolute: 0.1 10*3/uL (ref 0.0–0.1)
Basophils Relative: 1 % (ref 0.0–3.0)
Eosinophils Absolute: 0.2 10*3/uL (ref 0.0–0.7)
Eosinophils Relative: 3.4 % (ref 0.0–5.0)
HCT: 37.8 % (ref 36.0–46.0)
Hemoglobin: 12.8 g/dL (ref 12.0–15.0)
Lymphocytes Relative: 20.4 % (ref 12.0–46.0)
Lymphs Abs: 1.1 10*3/uL (ref 0.7–4.0)
MCHC: 33.9 g/dL (ref 30.0–36.0)
MCV: 89.7 fl (ref 78.0–100.0)
Monocytes Absolute: 0.4 10*3/uL (ref 0.1–1.0)
Monocytes Relative: 8.3 % (ref 3.0–12.0)
Neutro Abs: 3.6 10*3/uL (ref 1.4–7.7)
Neutrophils Relative %: 66.9 % (ref 43.0–77.0)
Platelets: 157 10*3/uL (ref 150.0–400.0)
RBC: 4.22 Mil/uL (ref 3.87–5.11)
RDW: 14.2 % (ref 11.5–15.5)
WBC: 5.3 10*3/uL (ref 4.0–10.5)

## 2023-07-30 LAB — COMPREHENSIVE METABOLIC PANEL WITH GFR
ALT: 8 U/L (ref 0–35)
AST: 15 U/L (ref 0–37)
Albumin: 4.4 g/dL (ref 3.5–5.2)
Alkaline Phosphatase: 86 U/L (ref 39–117)
BUN: 13 mg/dL (ref 6–23)
CO2: 30 meq/L (ref 19–32)
Calcium: 9.5 mg/dL (ref 8.4–10.5)
Chloride: 105 meq/L (ref 96–112)
Creatinine, Ser: 0.73 mg/dL (ref 0.40–1.20)
GFR: 83.18 mL/min (ref >60.00)
Glucose, Bld: 99 mg/dL (ref 70–99)
Potassium: 3.9 meq/L (ref 3.5–5.1)
Sodium: 141 meq/L (ref 135–145)
Total Bilirubin: 0.5 mg/dL (ref 0.2–1.2)
Total Protein: 6.7 g/dL (ref 6.0–8.3)

## 2023-07-30 LAB — TSH+T3+THYABS+TPO AB+TRAB+T...: TSH: 3 (ref 0.41–5.90)

## 2023-07-30 LAB — C-REACTIVE PROTEIN: CRP: <1 mg/dL (ref 0.5–20.0)

## 2023-07-30 LAB — SEDIMENTATION RATE: Sed Rate: 9 mm/h (ref 0–30)

## 2023-07-30 MED ORDER — TRIAMCINOLONE ACETONIDE 0.5 % EX OINT: 1.0000 | TOPICAL_OINTMENT | Freq: Two times a day (BID) | CUTANEOUS | 0 refills | Status: DC

## 2023-07-30 MED ORDER — TRIAMCINOLONE ACETONIDE 0.5 % EX OINT
1.0000 | TOPICAL_OINTMENT | Freq: Two times a day (BID) | CUTANEOUS | 0 refills | Status: DC
Start: 2023-07-30 — End: 2023-08-07

## 2023-07-30 NOTE — Patient Instructions (Addendum)
 We are checking labs today, will be in contact with any results that require further attention  I have sent in triamcinolone  cream for you.  You may use this twice a day.  If all of your labs are normal, I would suggest that we try to discontinue the quetiapine  to start with.

## 2023-07-30 NOTE — Patient Instructions (Signed)
 I have sent in phentermine  for you to take 37.5 mg once daily in the mornings  May restart Topamax  1 tablet in the evenings.  You have received a steroid injection in the office today.  Follow-up with me for new or worsening symptoms.

## 2023-07-30 NOTE — Progress Notes (Signed)
 Acute Office Visit  Subjective:     Patient ID: Teresa Vasquez, female    DOB: Feb 01, 1951, 73 y.o.   MRN: [id number]  Chief Complaint  Patient presents with   Acute Visit    Rash on bilateral arms, itching wakes her up at night. Has tried OTC cortisone, benadryl, claritin, Okeef lotion. Icing the areas helps a lot     HPI Patient is in today for itching to the right forearm that began about 5 or 6 days ago. States that the itching is so bad that it wakes her up at night. Reports that she is having some burning and stinging pain with the itching. Denies new exposures at home.  Started quetiapine  a month ago for insomnia.  Started levothyroxine, oxybutynin, metoprolol  about 3.5 months ago from GYN. Denies changes in bowel or bladder habits, pain, bruising, gross hematuria, hematochezia, hematemesis.   ROS Per HPI      Objective:    BP 122/80 (BP Location: Left Arm, Patient Position: Sitting)   Pulse 96   Temp 98.2 F (36.8 C) (Temporal)   Ht 5\' 7"  (1.702 m)   Wt 196 lb (88.9 kg)   SpO2 99%   BMI 30.70 kg/m    Physical Exam Vitals and nursing note reviewed.  Constitutional:      General: She is not in acute distress.    Appearance: Normal appearance.  HENT:     Head: Normocephalic and atraumatic.     Nose: Nose normal.     Mouth/Throat:     Mouth: Mucous membranes are moist.     Pharynx: Oropharynx is clear.  Eyes:     Extraocular Movements: Extraocular movements intact.  Cardiovascular:     Rate and Rhythm: Normal rate and regular rhythm.     Pulses: Normal pulses.     Heart sounds: Normal heart sounds.  Pulmonary:     Effort: Pulmonary effort is normal. No respiratory distress.     Breath sounds: Normal breath sounds. No wheezing, rhonchi or rales.  Musculoskeletal:        General: Normal range of motion.     Cervical back: Normal range of motion.     Right lower leg: No edema.     Left lower leg: No edema.  Lymphadenopathy:     Cervical: No  cervical adenopathy.  Skin:    Comments: Petechiae to BLE, back, neck, no obvious bruising  Neurological:     General: No focal deficit present.     Mental Status: She is alert and oriented to person, place, and time.  Psychiatric:        Mood and Affect: Mood normal.        Thought Content: Thought content normal.     No results found for any visits on 07/30/23.      Assessment & Plan:   Rash -     CBC with Differential/Platelet -     Comprehensive metabolic panel with GFR -     Sedimentation rate -     C-reactive protein -     TSH+T3+ThyAbs+TPO Ab+TRAb+T... -     Triamcinolone  Acetonide; Apply 1 Application topically 2 (two) times daily.  Dispense: 45 g; Refill: 0  Petechiae -     CBC with Differential/Platelet -     Comprehensive metabolic panel with GFR -     Sedimentation rate -     C-reactive protein -     TSH+T3+ThyAbs+TPO Ab+TRAb+T... -     Triamcinolone  Acetonide; Apply  1 Application topically 2 (two) times daily.  Dispense: 45 g; Refill: 0  Acquired hypothyroidism -     TSH+T3+ThyAbs+TPO Ab+TRAb+T...  Itching  May use triamcinolone  BID prn Etiology of itching unclear.  Meds ordered this encounter  Medications   DISCONTD: triamcinolone  ointment (KENALOG ) 0.5 %    Sig: Apply 1 Application topically 2 (two) times daily.    Dispense:  30 g    Refill:  0   triamcinolone  ointment (KENALOG ) 0.5 %    Sig: Apply 1 Application topically 2 (two) times daily.    Dispense:  45 g    Refill:  0    Return if symptoms worsen or fail to improve.  Wellington Half, FNP

## 2023-08-01 ENCOUNTER — Other Ambulatory Visit: Payer: Self-pay | Admitting: Family Medicine

## 2023-08-05 NOTE — Telephone Encounter (Signed)
 Copied from CRM [id number]. Topic: Clinical - Lab/Test Results >> Aug 05, 2023 12:17 PM Teresa Vasquez wrote: Reason for CRM: pt called to speak with nurse regarding her thyroid  test result please call pt back at [phone]

## 2023-08-07 MED ORDER — CLOBETASOL PROPIONATE 0.05 % EX OINT
1.0000 | TOPICAL_OINTMENT | Freq: Two times a day (BID) | CUTANEOUS | 0 refills | Status: AC
Start: 2023-08-07 — End: ?

## 2023-08-08 ENCOUNTER — Other Ambulatory Visit: Payer: Self-pay

## 2023-08-08 DIAGNOSIS — R21 Rash and other nonspecific skin eruption: Secondary | ICD-10-CM | POA: Diagnosis not present

## 2023-08-08 DIAGNOSIS — R233 Spontaneous ecchymoses: Secondary | ICD-10-CM

## 2023-08-08 DIAGNOSIS — L299 Pruritus, unspecified: Secondary | ICD-10-CM

## 2023-08-08 LAB — TSH+T3+THYABS+TPO AB+TRAB+T...
Anti-Thyroglobulin Antibodies: <1 [IU]/mL
Anti-Thyroid Peroxidase Ab: <9 [IU]/mL
Free T-3: 2.9 pg/mL
Free T4 by Dialysis: 1.3 ng/dL
Reverse T3,  LCMS Endo Sci: 17.8 ng/dL
TSH Receptor Antibody (TBII): <0.3 U/L
TSH: 3 uU/mL
Triiodothyronine (T-3), Serum: 77 ng/dL

## 2023-08-10 ENCOUNTER — Other Ambulatory Visit: Payer: Self-pay | Admitting: Family Medicine

## 2023-08-10 DIAGNOSIS — F419 Anxiety disorder, unspecified: Secondary | ICD-10-CM

## 2023-08-11 ENCOUNTER — Other Ambulatory Visit: Payer: Self-pay | Admitting: Family Medicine

## 2023-08-11 DIAGNOSIS — F5101 Primary insomnia: Secondary | ICD-10-CM

## 2023-08-11 DIAGNOSIS — Z79899 Other long term (current) drug therapy: Secondary | ICD-10-CM

## 2023-08-13 NOTE — Progress Notes (Signed)
 New Patient Note  RE: Teresa Vasquez MRN: [id number] DOB: 1950/11/10 Date of Office Visit: 08/14/2023  Consult requested by: Wellington Half, * Primary care provider: Wellington Half, FNP  Chief Complaint: Pruritus and Frequent Infections (Sinus infections )  History of Present Illness: I had the pleasure of seeing Senta Kantor for initial evaluation at the [facility] on 08/14/2023. She is a 73 y.o. female, who is referred here by Wellington Half, FNP for the evaluation of pruritus.  Rash/itching started about 3 weeks ago. Mainly occurs on her right arm and left ankle. Describes them as itchy. Associated symptoms include: none.  Frequency of episodes: occurs every 3 days.  Suspected triggers are unknown. Denies any fevers, chills, changes in medications, foods, personal care products or recent infections. She has tried the following therapies: aloe, ice with some benefit. Tried clobetasol  cream with no benefit. Systemic steroids none. Currently on hydroxyzine  10mg  BID.  Previous work up includes: 2025 labs cbc diff, cmp, tsh, crs, esr normal. Previous history of rash/hives: no. Patient is up to date with the following cancer screening tests: physical exam, colonoscopy. Needs mammogram.  Patient apparently had issues with bed bugs in the past but that was remediated. Husband doesn't sleep in the same room but he hasn't been complaining of any insect bites/rash.  Assessment and Plan: Kaleea is a 73 y.o. female with: Pruritic rash The rash does not like an allergic rash but more like insect/bug bites.  Check your house for insect/bugs. Keep track of rashes and take pictures. Write down what you had done during flares.  See below for proper skin care. Use fragrance free and dye free products. No dryer sheets or fabric softener.   May use benadryl cream up to three times per day as needed. Take hydroxyzine  25mg  1 hour before bedtime as needed  for itching. If no improvement in 1 month then will do further work up at that time with possible dermatology referral.   Other allergic rhinitis Usually flares in the spring and takes Singulair  with benefit.  Continue Singulair  (montelukast ) 10mg  daily at night. Consider allergy testing in the future for environmental allergies if interested.   Return if symptoms worsen or fail to improve.  Meds ordered this encounter  Medications   hydrOXYzine  (ATARAX ) 25 MG tablet    Sig: Take 1 tablet (25 mg total) by mouth at bedtime as needed for itching. Take 1 hour before bedtime as needed.    Dispense:  30 tablet    Refill:  1   diphenhydrAMINE-zinc acetate (BENADRYL EXTRA STRENGTH) cream    Sig: Apply 1 Application topically 3 (three) times daily as needed for itching.    Dispense:  28.4 g    Refill:  1   Lab Orders  No laboratory test(s) ordered today    Other allergy screening: Asthma: no Used albuterol  when she had pneumonia  Rhino conjunctivitis: yes Usually flares in the spring. Takes Singulair  with good benefit. Food allergy: no Medication allergy: yes Prednisone  causes blotching? Hymenoptera allergy: no  Diagnostics: None.   Past Medical History: Patient Active Problem List   Diagnosis Date Noted   Itching 07/30/2023   Petechiae 07/30/2023   Rash 07/30/2023   BPV (benign positional vertigo) 07/18/2023   GAD (generalized anxiety disorder) 06/16/2023   Gastroesophageal reflux disease with esophagitis without hemorrhage 06/16/2023   Medication management 06/16/2023   Acquired hypothyroidism 06/16/2023   Acute cough 06/16/2023   Primary insomnia 06/16/2023  Primary hypertension 06/16/2023   DOE (dyspnea on exertion) 05/19/2023   Cough variant asthma vs UACS 05/19/2023   Referred otalgia of left ear 08/21/2022   History of dental problems 08/21/2022   Positive ANA (antinuclear antibody) 08/30/2021   Iron deficiency anemia 09/01/2020   Insomnia due to  psychological stress 04/20/2020   Plantar fasciitis of left foot 06/16/2018   Class 1 obesity due to excess calories with serious comorbidity and body mass index (BMI) of 30.0 to 30.9 in adult 08/27/2017   Allergic rhinitis 03/14/2013   Vitamin D  deficiency 03/14/2013   Mixed hyperlipidemia 03/14/2013   Anxiety 04/13/2009   Major depressive disorder in partial remission (HCC) 04/13/2009   Past Medical History:  Diagnosis Date   Anxiety and depression    Broken bones 09/2022   Broken left wrist   Cholecystitis, acute with cholelithiasis 12/17/2013   Depression    Diverticulitis    DIVERTICULITIS 04/19/2009   Qualifier: Diagnosis of  By: Arvie Latus MD, Moishe Angel    Hyperlipidemia    Hypertension    Irritable bowel syndrome    Past Surgical History: Past Surgical History:  Procedure Laterality Date   ADENOIDECTOMY     CHOLECYSTECTOMY N/A 12/17/2013   Procedure: LAPAROSCOPIC CHOLECYSTECTOMY WITH INTRAOPERATIVE CHOLANGIOGRAM;  Surgeon: Boyce Byes, MD;  Location: [facility] OR;  Service: General;  Laterality: N/A;   COLON SURGERY  2012   Diverticulosis   KNEE ARTHROSCOPY Right 2019   x 2   TONSILLECTOMY     TONSILLECTOMY     VAGINAL HYSTERECTOMY     WRIST SURGERY Left    10/11/2022   Medication List:  Current Outpatient Medications  Medication Sig Dispense Refill   budesonide -formoterol  (SYMBICORT ) 80-4.5 MCG/ACT inhaler Take 1- 2 puffs first thing in am and then another 2 puffs about 12 hours later. 1 each 12   busPIRone  (BUSPAR ) 5 MG tablet TAKE 1 TABLET BY MOUTH TWICE A DAY 60 tablet 3   Cholecalciferol (VITAMIN D3) 125 MCG (5000 UT) CAPS Takes 2 capsules (10,000 units )  Daily     clobetasol  ointment (TEMOVATE ) 0.05 % Apply 1 Application topically 2 (two) times daily. 60 g 0   diphenhydrAMINE-zinc acetate (BENADRYL EXTRA STRENGTH) cream Apply 1 Application topically 3 (three) times daily as needed for itching. 28.4 g 1   ELDERBERRY PO Take 1 tablet by mouth daily. Calcium  and vit  c in this supplement     famotidine  (PEPCID ) 20 MG tablet One after supper 30 tablet 11   Ferrous Sulfate (IRON PO) Take 18 mg by mouth daily.     fluticasone  (FLONASE ) 50 MCG/ACT nasal spray Place 2 sprays into both nostrils daily. Use  2 sprays  to each Nostril  Daily 18.2 mL 2   hydrOXYzine  (ATARAX ) 25 MG tablet Take 1 tablet (25 mg total) by mouth at bedtime as needed for itching. Take 1 hour before bedtime as needed. 30 tablet 1   ibuprofen  (ADVIL ) 600 MG tablet Take 600 mg by mouth.     levothyroxine (SYNTHROID) 25 MCG tablet Take 25 mcg by mouth daily before breakfast.     metoprolol  tartrate (LOPRESSOR ) 25 MG tablet Take 1 tablet (25 mg total) by mouth 2 (two) times daily. 60 tablet 11   montelukast  (SINGULAIR ) 10 MG tablet TAKE 1 TABLET BY MOUTH DAILY FOR ALLERGIES 90 tablet 3   omeprazole  (PRILOSEC) 40 MG capsule TAKE 1 CAPSULE BY MOUTH ONCE DAILY TO PREVENT HEARTBURN AND INDIGESTION 90 capsule 3   oxybutynin (DITROPAN-XL) 10 MG  24 hr tablet Take 10 mg by mouth at bedtime.     QUEtiapine  (SEROQUEL ) 200 MG tablet TAKE 1 TABLET BY MOUTH AT BEDTIME. 30 tablet 1   SODIUM FLUORIDE 5000 PPM 1.1 % PSTE Take 1 application  by mouth at bedtime.     Zinc 50 MG TABS Take 1 tablet by mouth daily.     No current facility-administered medications for this visit.   Allergies: Allergies  Allergen Reactions   Meclizine  Itching   Levaquin [Levofloxacin] Other (See Comments)    Does not tolerate oral levaquin   Social History: Social History   Socioeconomic History   Marital status: Married    Spouse name: Not on file   Number of children: 1   Years of education: Not on file   Highest education level: Not on file  Occupational History   Occupation: Geophysicist/field seismologist: [employer]   Occupation: Retired    Associate Professor: Advice worker [employer]  Tobacco Use   Smoking status: Never    Passive exposure: Never   Smokeless tobacco: Never  Vaping Use   Vaping status: Never Used   Substance and Sexual Activity   Alcohol use: No   Drug use: No   Sexual activity: Yes    Partners: Male    Birth control/protection: Post-menopausal  Other Topics Concern   Not on file  Social History Narrative   Daily caffiene use.  Coke or tea 2-3 daily   Education: HS grad   Work: retired (worked Publishing rights manager).    Poll work.    Social Drivers of Corporate investment banker Strain: Not on file  Food Insecurity: Low Risk  (08/21/2022)   Received from [facility]   Hunger Vital Sign    Within the past 12 months, you worried that your food would run out before you got money to buy more: Never true    Within the past 12 months, the food you bought just didn't last and you didn't have money to get more. : Never true  Transportation Needs: No Transportation Needs (08/21/2022)   Received from [facility]    In the past 12 months, has lack of reliable transportation kept you from medical appointments, meetings, work or from getting things needed for daily living? : No  Physical Activity: Inactive (03/09/2018)   Exercise Vital Sign    Days of Exercise per Week: 0 days    Minutes of Exercise per Session: 0 min  Stress: No Stress Concern Present (03/09/2018)   [employer] of Occupational Health - Occupational Stress Questionnaire    Feeling of Stress : Only a little  Social Connections: Not on file   Lives in a house. Smoking: denies Occupation: retired  Landscape architect History: Water  Damage/mildew in the house: no Engineer, civil (consulting) in the family room: no Carpet in the bedroom: no Heating: gas Cooling: central Pet: yes 1 dog x 6 yrs  Family History: Family History  Problem Relation Age of Onset   Heart disease Mother    Hypertension Mother    Osteoporosis Mother    Heart disease Father    Heart attack Father 44   Asthma Sister    Irritable bowel syndrome Sister    Brain cancer Sister 73   Heart attack Brother 70   Heart disease Brother    Throat  cancer Brother        remote smoker   Cancer Paternal Aunt        oral  Healthy Son    Review of Systems  Constitutional:  Positive for chills. Negative for appetite change, fever and unexpected weight change.  HENT:  Negative for congestion and rhinorrhea.   Eyes:  Negative for itching.  Respiratory:  Negative for cough, chest tightness, shortness of breath and wheezing.   Cardiovascular:  Negative for chest pain.  Gastrointestinal:  Negative for abdominal pain.  Genitourinary:  Negative for difficulty urinating.  Skin:  Positive for rash.       itching  Neurological:  Negative for headaches.    Objective: BP 122/74   Pulse 93   Temp 98.2 F (36.8 C)   Resp 16   Ht 5' 5.75 (1.67 m)   Wt 195 lb (88.5 kg)   SpO2 95%   BMI 31.72 kg/m  Body mass index is 31.72 kg/m. Physical Exam Vitals and nursing note reviewed.  Constitutional:      Appearance: Normal appearance. She is well-developed.  HENT:     Head: Normocephalic and atraumatic.     Right Ear: Tympanic membrane and external ear normal.     Left Ear: Tympanic membrane and external ear normal.     Nose: Nose normal.     Mouth/Throat:     Mouth: Mucous membranes are moist.     Pharynx: Oropharynx is clear.   Eyes:     Conjunctiva/sclera: Conjunctivae normal.    Cardiovascular:     Rate and Rhythm: Normal rate and regular rhythm.     Heart sounds: Normal heart sounds. No murmur heard.    No friction rub. No gallop.  Pulmonary:     Effort: Pulmonary effort is normal.     Breath sounds: Normal breath sounds. No wheezing, rhonchi or rales.   Musculoskeletal:     Cervical back: Neck supple.   Skin:    General: Skin is warm.     Findings: Rash present.     Comments: Few scattered raised erythematous papular rash on right forearm. Medial right ankle - bug bite like bites.   Neurological:     Mental Status: She is alert and oriented to person, place, and time.   Psychiatric:        Behavior: Behavior  normal.   The plan was reviewed with the patient/family, and all questions/concerned were addressed.  It was my pleasure to see Dashanae today and participate in her care. Please feel free to contact me with any questions or concerns.  Sincerely,  Eudelia Hero, DO Allergy & Immunology  [facility]  [place] office: [phone] [facility] office: [phone]

## 2023-08-14 ENCOUNTER — Ambulatory Visit: Payer: No Typology Code available for payment source | Admitting: Allergy

## 2023-08-14 ENCOUNTER — Encounter: Payer: Self-pay | Admitting: Allergy

## 2023-08-14 VITALS — BP 122/74 | HR 93 | Temp 98.2°F | Resp 16 | Ht 65.75 in | Wt 195.0 lb

## 2023-08-14 DIAGNOSIS — L282 Other prurigo: Secondary | ICD-10-CM

## 2023-08-14 DIAGNOSIS — J3089 Other allergic rhinitis: Secondary | ICD-10-CM

## 2023-08-14 MED ORDER — DIPHENHYDRAMINE-ZINC ACETATE 2-0.1 % EX CREA: 1.0000 | TOPICAL_CREAM | Freq: Three times a day (TID) | CUTANEOUS | 1 refills | Status: AC | PRN

## 2023-08-14 MED ORDER — HYDROXYZINE HCL 25 MG PO TABS: 25.0000 mg | ORAL_TABLET | Freq: Every evening | ORAL | 1 refills | Status: DC | PRN

## 2023-08-14 NOTE — Patient Instructions (Addendum)
 Itching/rash  The rash does not like an allergic rash but more like insect/bug bites. Check your house for insect/bugs.  Keep track of rashes and take pictures. Write down what you had done during flares.  See below for proper skin care. Use fragrance free and dye free products. No dryer sheets or fabric softener.   May use benadryl cream up to three times per day as needed. Take hydroxyzine  25mg  1 hour before bedtime as needed for itching.  Environmental allergies Continue Singulair  (montelukast ) 10mg  daily at night. Consider allergy testing in the future for environmental allergies if interested.   Follow up as needed if symptoms don't improve.    Skin care recommendations  Bath time: Always use lukewarm water . AVOID very hot or cold water . Keep bathing time to 5-10 minutes. Do NOT use bubble bath. Use a mild soap and use just enough to wash the dirty areas. Do NOT scrub skin vigorously.  After bathing, pat dry your skin with a towel. Do NOT rub or scrub the skin.  Moisturizers and prescriptions:  ALWAYS apply moisturizers immediately after bathing (within 3 minutes). This helps to lock-in moisture. Use the moisturizer several times a day over the whole body. Good summer moisturizers include: Aveeno, CeraVe, Cetaphil. Good winter moisturizers include: Aquaphor, Vaseline, Cerave, Cetaphil, Eucerin, Vanicream. When using moisturizers along with medications, the moisturizer should be applied about one hour after applying the medication to prevent diluting effect of the medication or moisturize around where you applied the medications. When not using medications, the moisturizer can be continued twice daily as maintenance.  Laundry and clothing: Avoid laundry products with added color or perfumes. Use unscented hypo-allergenic laundry products such as Tide free, Cheer free & gentle, and All free and clear.  If the skin still seems dry or sensitive, you can try double-rinsing the  clothes. Avoid tight or scratchy clothing such as wool. Do not use fabric softeners or dyer sheets.

## 2023-09-01 DIAGNOSIS — M1711 Unilateral primary osteoarthritis, right knee: Secondary | ICD-10-CM | POA: Diagnosis not present

## 2023-09-23 ENCOUNTER — Encounter: Payer: Self-pay | Admitting: Family Medicine

## 2023-09-23 ENCOUNTER — Ambulatory Visit (INDEPENDENT_AMBULATORY_CARE_PROVIDER_SITE_OTHER): Admitting: Family Medicine

## 2023-09-23 VITALS — BP 134/80 | Temp 98.5°F | Ht 65.75 in | Wt 192.6 lb

## 2023-09-23 DIAGNOSIS — E039 Hypothyroidism, unspecified: Secondary | ICD-10-CM | POA: Diagnosis not present

## 2023-09-23 DIAGNOSIS — M25561 Pain in right knee: Secondary | ICD-10-CM | POA: Diagnosis not present

## 2023-09-23 DIAGNOSIS — Z79899 Other long term (current) drug therapy: Secondary | ICD-10-CM

## 2023-09-23 DIAGNOSIS — G8929 Other chronic pain: Secondary | ICD-10-CM

## 2023-09-23 DIAGNOSIS — K2100 Gastro-esophageal reflux disease with esophagitis, without bleeding: Secondary | ICD-10-CM

## 2023-09-23 DIAGNOSIS — F419 Anxiety disorder, unspecified: Secondary | ICD-10-CM

## 2023-09-23 DIAGNOSIS — M25562 Pain in left knee: Secondary | ICD-10-CM

## 2023-09-23 DIAGNOSIS — R21 Rash and other nonspecific skin eruption: Secondary | ICD-10-CM | POA: Diagnosis not present

## 2023-09-23 DIAGNOSIS — N3281 Overactive bladder: Secondary | ICD-10-CM | POA: Diagnosis not present

## 2023-09-23 DIAGNOSIS — I10 Essential (primary) hypertension: Secondary | ICD-10-CM | POA: Diagnosis not present

## 2023-09-23 DIAGNOSIS — F411 Generalized anxiety disorder: Secondary | ICD-10-CM | POA: Diagnosis not present

## 2023-09-23 DIAGNOSIS — F5101 Primary insomnia: Secondary | ICD-10-CM | POA: Diagnosis not present

## 2023-09-23 MED ORDER — QUETIAPINE FUMARATE 300 MG PO TABS: 300.0000 mg | ORAL_TABLET | Freq: Every day | ORAL | 1 refills | Status: DC

## 2023-09-23 MED ORDER — OMEPRAZOLE 40 MG PO CPDR: DELAYED_RELEASE_CAPSULE | ORAL | 1 refills | Status: DC

## 2023-09-23 MED ORDER — BUSPIRONE HCL 5 MG PO TABS: ORAL_TABLET | ORAL | 3 refills | Status: DC

## 2023-09-23 NOTE — Progress Notes (Signed)
 Acute Office Visit  Subjective:     Patient ID: Teresa Vasquez, female    DOB: 06/29/1950, 73 y.o.   MRN: [id number]  Chief Complaint  Patient presents with   Follow-up    Discuss sleep and pimples on back present for about 1 month    HPI  Discussed the use of AI scribe software for clinical note transcription with the patient, who gave verbal consent to proceed.  History of Present Illness Teresa Vasquez is a 73 year old female who presents for a follow-up regarding knee pain and sleep issues.  Knee pain - Ongoing knee pain - Uses a knee brace, which provides relief for approximately two days after wearing it for two to three days - Receiving gel injections for knee pain - Has opted against knee replacement surgery  Sleep disturbance - Sleep disturbances persist - Takes Seroquel  200 mg for sleep - Considering increasing Seroquel  dose to 300 mg, as her husband experiences better results at this dose - Takes Buspar  10 mg at night - Contemplating an additional 5 mg Buspar  during the day for anxiety  Rash/Itching - Rash has improved with Benadryl  cream containing zinc  and increased hydroxyzine  dose to 25 mg at bedtime - Uses ice and aloe vera gel for symptomatic relief - Rash occasionally recurs but is less severe  Gastroesophageal reflux symptoms - Currently out of omeprazole , which she takes at 40 mg     ROS Per HPI      Objective:    BP 134/80 (BP Location: Left Arm, Patient Position: Sitting)   Temp 98.5 F (36.9 C) (Temporal)   Ht 5' 5.75 (1.67 m)   Wt 192 lb 9.6 oz (87.4 kg)   SpO2 95%   BMI 31.32 kg/m    Physical Exam Vitals and nursing note reviewed.  Constitutional:      General: She is not in acute distress.    Appearance: Normal appearance. She is obese.  HENT:     Head: Normocephalic and atraumatic.     Right Ear: External ear normal.     Left Ear: External ear normal.     Nose: Nose normal.     Mouth/Throat:     Mouth: Mucous  membranes are moist.     Pharynx: Oropharynx is clear.  Eyes:     Extraocular Movements: Extraocular movements intact.     Pupils: Pupils are equal, round, and reactive to light.  Cardiovascular:     Rate and Rhythm: Normal rate and regular rhythm.     Pulses: Normal pulses.     Heart sounds: Normal heart sounds.  Pulmonary:     Effort: Pulmonary effort is normal. No respiratory distress.     Breath sounds: Normal breath sounds. No wheezing, rhonchi or rales.  Musculoskeletal:        General: Normal range of motion.     Cervical back: Normal range of motion.     Right lower leg: No edema.     Left lower leg: No edema.  Lymphadenopathy:     Cervical: No cervical adenopathy.  Skin:    Findings: Lesion present.         Comments: Mildly erythematous papular lesion, non tender, no discharge, no bleeding  Neurological:     General: No focal deficit present.     Mental Status: She is alert and oriented to person, place, and time.  Psychiatric:        Mood and Affect: Mood normal.  Thought Content: Thought content normal.     No results found for any visits on 09/23/23.      Assessment & Plan:   Assessment and Plan Assessment & Plan Rash with Petechiae and Itching Rash improved with Benadryl  cream and increased hydroxychloroquine. Cause undetermined. - Continue Benadryl  cream with zinc  as needed. - Continue hydroxychloroquine at 25 mg.  Insomnia/GAD Increased quetiapine  to 300 mg to improve sleep. Stress may contribute to insomnia. Buspirone  may help manage anxiety. - Increase quetiapine  to 300 mg at night. - Take 5 mg buspirone  during the day as needed and 10 mg at night.  Knee Pain Undergoing gel injections. Knee brace provides temporary relief. Surgery not pursued. - Continue gel injections. - Use knee brace as needed.  Hypothyroidism - stable, TSH   Gastroesophageal Reflux Disease (GERD) Requires omeprazole  refill. - Refill omeprazole   prescription.  OAB - Stable  HTN - Controlled - CBC, CMP  Morbid Obesity - Working on improving knee pain so that she can exercise more easily  Follow-up/Med Mgt Labs in May satisfactory. Repeat labs in November unless new concerns. - Schedule follow-up in November for labs and evaluation.     No orders of the defined types were placed in this encounter.    Meds ordered this encounter  Medications   QUEtiapine  (SEROQUEL ) 300 MG tablet    Sig: Take 1 tablet (300 mg total) by mouth at bedtime.    Dispense:  90 tablet    Refill:  1   busPIRone  (BUSPAR ) 5 MG tablet    Sig: Take 5mg  during the day and 10mg  at night as needed for anxiety    Dispense:  60 tablet    Refill:  3    DX Code Needed (GAD: F41.9)   omeprazole  (PRILOSEC) 40 MG capsule    Sig: TAKE 1 CAPSULE BY MOUTH ONCE DAILY TO PREVENT HEARTBURN AND INDIGESTION    Dispense:  90 capsule    Refill:  1    PATIENT REQUESTED A REFILL    Return in about 6 months (around 03/25/2024) for med check.  Corean LITTIE Ku, FNP

## 2023-09-23 NOTE — Patient Instructions (Addendum)
 Refilled omeprazole  today  Take Buspar  5mg  during the day and 10mg  at night as needed  INCREASE seroquel  300mg  once at bedtime nightly  Continue current medication regimen  Follow up with me in November as scheduled

## 2023-09-29 ENCOUNTER — Encounter: Payer: Self-pay | Admitting: Family Medicine

## 2023-09-30 ENCOUNTER — Other Ambulatory Visit: Payer: Self-pay

## 2023-09-30 ENCOUNTER — Telehealth: Payer: Self-pay | Admitting: Family Medicine

## 2023-09-30 DIAGNOSIS — Z1231 Encounter for screening mammogram for malignant neoplasm of breast: Secondary | ICD-10-CM

## 2023-09-30 DIAGNOSIS — G8929 Other chronic pain: Secondary | ICD-10-CM | POA: Insufficient documentation

## 2023-09-30 NOTE — Telephone Encounter (Signed)
 Copied from CRM [id number]. Topic: Clinical - Request for Lab/Test Order >> Sep 30, 2023  3:18 PM Rea C wrote: Reason for CRM: Patient would like for Corean to put in an order for a mammogram. Patient would like to know if she needs to come in for an appointment or can Corean just put the order in for it?   Patient's contact is [phone].

## 2023-09-30 NOTE — Telephone Encounter (Signed)
 Mammogram ordered, LVM making patient aware

## 2023-10-06 ENCOUNTER — Ambulatory Visit: Admitting: Family Medicine

## 2023-10-08 ENCOUNTER — Inpatient Hospital Stay
Admission: RE | Admit: 2023-10-08 | Discharge: 2023-10-08 | Discharge status: Home / Self Care | Source: Ambulatory Visit | Attending: Family Medicine

## 2023-10-08 DIAGNOSIS — Z1231 Encounter for screening mammogram for malignant neoplasm of breast: Secondary | ICD-10-CM

## 2023-10-21 ENCOUNTER — Ambulatory Visit: Payer: Medicare HMO | Admitting: Nurse Practitioner

## 2023-10-28 ENCOUNTER — Other Ambulatory Visit: Payer: Self-pay | Admitting: Family Medicine

## 2023-10-29 ENCOUNTER — Ambulatory Visit

## 2023-11-07 NOTE — Progress Notes (Deleted)
 Office Visit Note  Patient: Teresa Vasquez             Date of Birth: 05-Jul-1950           MRN: [id number]             PCP: Alvia Corean CROME, FNP Referring: Tonita Fallow, MD Visit Date: 11/19/2023 Occupation: @GUAROCC @  Subjective:  No chief complaint on file.   History of Present Illness: Teresa Vasquez is a 73 y.o. female ***     Activities of Daily Living:  Patient reports morning stiffness for *** {minute/hour:19697}.   Patient {ACTIONS;DENIES/REPORTS:21021675::Denies} nocturnal pain.  Difficulty dressing/grooming: {ACTIONS;DENIES/REPORTS:21021675::Denies} Difficulty climbing stairs: {ACTIONS;DENIES/REPORTS:21021675::Denies} Difficulty getting out of chair: {ACTIONS;DENIES/REPORTS:21021675::Denies} Difficulty using hands for taps, buttons, cutlery, and/or writing: {ACTIONS;DENIES/REPORTS:21021675::Denies}  No Rheumatology ROS completed.   PMFS History:  Patient Active Problem List   Diagnosis Date Noted   Chronic pain of both knees 09/30/2023   Morbid obesity (HCC) 09/23/2023   OAB (overactive bladder) 09/23/2023   Itching 07/30/2023   Petechiae 07/30/2023   Rash 07/30/2023   BPV (benign positional vertigo) 07/18/2023   GAD (generalized anxiety disorder) 06/16/2023   Gastroesophageal reflux disease with esophagitis without hemorrhage 06/16/2023   Medication management 06/16/2023   Acquired hypothyroidism 06/16/2023   Acute cough 06/16/2023   Primary insomnia 06/16/2023   Primary hypertension 06/16/2023   DOE (dyspnea on exertion) 05/19/2023   Cough variant asthma vs UACS 05/19/2023   Referred otalgia of left ear 08/21/2022   History of dental problems 08/21/2022   Positive ANA (antinuclear antibody) 08/30/2021   Iron deficiency anemia 09/01/2020   Insomnia due to psychological stress 04/20/2020   Plantar fasciitis of left foot 06/16/2018   Class 1 obesity due to excess calories with serious comorbidity and body mass index (BMI) of 30.0  to 30.9 in adult 08/27/2017   Allergic rhinitis 03/14/2013   Vitamin D  deficiency 03/14/2013   Mixed hyperlipidemia 03/14/2013   Anxiety 04/13/2009   Major depressive disorder in partial remission (HCC) 04/13/2009    Past Medical History:  Diagnosis Date   Anxiety and depression    Broken bones 09/2022   Broken left wrist   Cholecystitis, acute with cholelithiasis 12/17/2013   Depression    Diverticulitis    DIVERTICULITIS 04/19/2009   Qualifier: Diagnosis of  By: Debrah MD, Lamar BIRCH    Hyperlipidemia    Hypertension    Irritable bowel syndrome     Family History  Problem Relation Age of Onset   Heart disease Mother    Hypertension Mother    Osteoporosis Mother    Heart disease Father    Heart attack Father 15   Asthma Sister    Irritable bowel syndrome Sister    Brain cancer Sister 63   Heart attack Brother 70   Heart disease Brother    Throat cancer Brother        remote smoker   Cancer Paternal Aunt        oral   Healthy Son    Past Surgical History:  Procedure Laterality Date   ADENOIDECTOMY     CHOLECYSTECTOMY N/A 12/17/2013   Procedure: LAPAROSCOPIC CHOLECYSTECTOMY WITH INTRAOPERATIVE CHOLANGIOGRAM;  Surgeon: Elon Pacini, MD;  Location: [facility] OR;  Service: General;  Laterality: N/A;   COLON SURGERY  2012   Diverticulosis   KNEE ARTHROSCOPY Right 2019   x 2   TONSILLECTOMY     TONSILLECTOMY     VAGINAL HYSTERECTOMY     WRIST SURGERY Left  10/11/2022   Social History   Social History Narrative   Daily caffiene use.  Coke or tea 2-3 daily   Education: HS grad   Work: retired (worked Publishing rights manager).    Poll work.    Immunization History  Administered Date(s) Administered   INFLUENZA, HIGH DOSE SEASONAL PF 04/10/2018   PFIZER(Purple Top)SARS-COV-2 Vaccination 07/06/2019, 07/27/2019   Pneumococcal Conjugate-13 04/21/2019   Pneumococcal Polysaccharide-23 04/20/2020   Tdap 08/27/2012     Objective: Vital Signs: There were no vitals taken  for this visit.   Physical Exam   Musculoskeletal Exam: ***  CDAI Exam: CDAI Score: -- Patient Global: --; Provider Global: -- Swollen: --; Tender: -- Joint Exam 11/19/2023   No joint exam has been documented for this visit   There is currently no information documented on the homunculus. Go to the Rheumatology activity and complete the homunculus joint exam.  Investigation: No additional findings.  Imaging: MM 3D SCREENING MAMMOGRAM BILATERAL BREAST Result Date: 10/10/2023 CLINICAL DATA:  Screening. EXAM: DIGITAL SCREENING BILATERAL MAMMOGRAM WITH TOMOSYNTHESIS AND CAD TECHNIQUE: Bilateral screening digital craniocaudal and mediolateral oblique mammograms were obtained. Bilateral screening digital breast tomosynthesis was performed. The images were evaluated with computer-aided detection. COMPARISON:  Previous exam(s). ACR Breast Density Category a: The breasts are almost entirely fatty. FINDINGS: There are no findings suspicious for malignancy. IMPRESSION: No mammographic evidence of malignancy. A result letter of this screening mammogram will be mailed directly to the patient. RECOMMENDATION: Screening mammogram in one year. (Code:SM-B-01Y) BI-RADS CATEGORY  1: Negative. Electronically Signed   By: Rosina Gelineau M.D.   On: 10/10/2023 14:38    Recent Labs: Lab Results  Component Value Date   WBC 5.3 07/30/2023   HGB 12.8 07/30/2023   PLT 157.0 07/30/2023   NA 141 07/30/2023   K 3.9 07/30/2023   CL 105 07/30/2023   CO2 30 07/30/2023   GLUCOSE 99 07/30/2023   BUN 13 07/30/2023   CREATININE 0.73 07/30/2023   BILITOT 0.5 07/30/2023   ALKPHOS 86 07/30/2023   AST 15 07/30/2023   ALT 8 07/30/2023   PROT 6.7 07/30/2023   ALBUMIN 4.4 07/30/2023   CALCIUM  9.5 07/30/2023   GFRAA 88 08/30/2020    Speciality Comments: No specialty comments available.  Procedures:  No procedures performed Allergies: Meclizine  and Levaquin [levofloxacin]   Assessment / Plan:     Visit  Diagnoses: No diagnosis found.  Orders: No orders of the defined types were placed in this encounter.  No orders of the defined types were placed in this encounter.   Face-to-face time spent with patient was *** minutes. Greater than 50% of time was spent in counseling and coordination of care.  Follow-Up Instructions: No follow-ups on file.   Daved JAYSON Gavel, CMA  Note - This record has been created using Animal nutritionist.  Chart creation errors have been sought, but may not always  have been located. Such creation errors do not reflect on  the standard of medical care.

## 2023-11-19 ENCOUNTER — Ambulatory Visit: Payer: BC Managed Care – PPO | Admitting: Rheumatology

## 2023-11-19 DIAGNOSIS — Z8719 Personal history of other diseases of the digestive system: Secondary | ICD-10-CM

## 2023-11-19 DIAGNOSIS — E559 Vitamin D deficiency, unspecified: Secondary | ICD-10-CM

## 2023-11-19 DIAGNOSIS — D508 Other iron deficiency anemias: Secondary | ICD-10-CM

## 2023-11-19 DIAGNOSIS — F419 Anxiety disorder, unspecified: Secondary | ICD-10-CM

## 2023-11-19 DIAGNOSIS — R202 Paresthesia of skin: Secondary | ICD-10-CM

## 2023-11-19 DIAGNOSIS — E782 Mixed hyperlipidemia: Secondary | ICD-10-CM

## 2023-11-19 DIAGNOSIS — R768 Other specified abnormal immunological findings in serum: Secondary | ICD-10-CM

## 2023-11-19 DIAGNOSIS — M19041 Primary osteoarthritis, right hand: Secondary | ICD-10-CM

## 2023-11-19 DIAGNOSIS — M8589 Other specified disorders of bone density and structure, multiple sites: Secondary | ICD-10-CM

## 2023-11-19 DIAGNOSIS — R7309 Other abnormal glucose: Secondary | ICD-10-CM

## 2023-11-19 DIAGNOSIS — F5102 Adjustment insomnia: Secondary | ICD-10-CM

## 2023-11-19 DIAGNOSIS — M722 Plantar fascial fibromatosis: Secondary | ICD-10-CM

## 2023-11-22 ENCOUNTER — Other Ambulatory Visit: Payer: Self-pay | Admitting: Family Medicine

## 2023-11-22 DIAGNOSIS — E66811 Other obesity due to excess calories: Secondary | ICD-10-CM

## 2023-12-15 ENCOUNTER — Other Ambulatory Visit: Payer: Self-pay | Admitting: Family Medicine

## 2023-12-15 DIAGNOSIS — F411 Generalized anxiety disorder: Secondary | ICD-10-CM

## 2023-12-15 DIAGNOSIS — Z79899 Other long term (current) drug therapy: Secondary | ICD-10-CM

## 2023-12-15 DIAGNOSIS — F5101 Primary insomnia: Secondary | ICD-10-CM

## 2023-12-17 ENCOUNTER — Encounter: Payer: Self-pay | Admitting: Family Medicine

## 2023-12-19 MED ORDER — HYDROXYZINE HCL 25 MG PO TABS: 25.0000 mg | ORAL_TABLET | Freq: Every evening | ORAL | 1 refills | Status: DC | PRN

## 2024-01-15 ENCOUNTER — Ambulatory Visit (INDEPENDENT_AMBULATORY_CARE_PROVIDER_SITE_OTHER): Admitting: Family Medicine

## 2024-01-15 ENCOUNTER — Encounter: Payer: Self-pay | Admitting: Family Medicine

## 2024-01-15 VITALS — BP 116/80 | HR 110 | Temp 98.0°F | Ht 65.75 in | Wt 185.0 lb

## 2024-01-15 DIAGNOSIS — F411 Generalized anxiety disorder: Secondary | ICD-10-CM | POA: Diagnosis not present

## 2024-01-15 DIAGNOSIS — B9689 Other specified bacterial agents as the cause of diseases classified elsewhere: Secondary | ICD-10-CM

## 2024-01-15 DIAGNOSIS — J329 Chronic sinusitis, unspecified: Secondary | ICD-10-CM | POA: Diagnosis not present

## 2024-01-15 DIAGNOSIS — E039 Hypothyroidism, unspecified: Secondary | ICD-10-CM

## 2024-01-15 DIAGNOSIS — L299 Pruritus, unspecified: Secondary | ICD-10-CM

## 2024-01-15 DIAGNOSIS — E782 Mixed hyperlipidemia: Secondary | ICD-10-CM | POA: Diagnosis not present

## 2024-01-15 DIAGNOSIS — Z79899 Other long term (current) drug therapy: Secondary | ICD-10-CM | POA: Diagnosis not present

## 2024-01-15 DIAGNOSIS — R051 Acute cough: Secondary | ICD-10-CM

## 2024-01-15 LAB — VITAMIN B12: Vitamin B-12: >1500 pg/mL — ABNORMAL HIGH (ref 211–911)

## 2024-01-15 LAB — LIPID PANEL
Cholesterol: 278 mg/dL — ABNORMAL HIGH (ref 0–200)
HDL: 38 mg/dL — ABNORMAL LOW (ref >39.00)
LDL Cholesterol: 197 mg/dL — ABNORMAL HIGH (ref 0–99)
NonHDL: 239.78
Total CHOL/HDL Ratio: 7
Triglycerides: 212 mg/dL — ABNORMAL HIGH (ref 0.0–149.0)
VLDL: 42.4 mg/dL — ABNORMAL HIGH (ref 0.0–40.0)

## 2024-01-15 LAB — CBC WITH DIFFERENTIAL/PLATELET
Basophils Absolute: 0 K/uL (ref 0.0–0.1)
Basophils Relative: 1.1 % (ref 0.0–3.0)
Eosinophils Absolute: 0.2 K/uL (ref 0.0–0.7)
Eosinophils Relative: 4.2 % (ref 0.0–5.0)
HCT: 38.1 % (ref 36.0–46.0)
Hemoglobin: 12.9 g/dL (ref 12.0–15.0)
Lymphocytes Relative: 22.9 % (ref 12.0–46.0)
Lymphs Abs: 0.9 K/uL (ref 0.7–4.0)
MCHC: 33.9 g/dL (ref 30.0–36.0)
MCV: 88.1 fl (ref 78.0–100.0)
Monocytes Absolute: 0.4 K/uL (ref 0.1–1.0)
Monocytes Relative: 11 % (ref 3.0–12.0)
Neutro Abs: 2.3 K/uL (ref 1.4–7.7)
Neutrophils Relative %: 60.8 % (ref 43.0–77.0)
Platelets: 155 K/uL (ref 150.0–400.0)
RBC: 4.32 Mil/uL (ref 3.87–5.11)
RDW: 13.7 % (ref 11.5–15.5)
WBC: 3.8 K/uL — ABNORMAL LOW (ref 4.0–10.5)

## 2024-01-15 LAB — COMPREHENSIVE METABOLIC PANEL WITH GFR
ALT: 4 U/L (ref 0–35)
AST: 12 U/L (ref 0–37)
Albumin: 4.2 g/dL (ref 3.5–5.2)
Alkaline Phosphatase: 88 U/L (ref 39–117)
BUN: 12 mg/dL (ref 6–23)
CO2: 27 meq/L (ref 19–32)
Calcium: 9.6 mg/dL (ref 8.4–10.5)
Chloride: 109 meq/L (ref 96–112)
Creatinine, Ser: 0.96 mg/dL (ref 0.40–1.20)
GFR: 59.69 mL/min — ABNORMAL LOW (ref >60.00)
Glucose, Bld: 84 mg/dL (ref 70–99)
Potassium: 4.7 meq/L (ref 3.5–5.1)
Sodium: 143 meq/L (ref 135–145)
Total Bilirubin: 0.5 mg/dL (ref 0.2–1.2)
Total Protein: 6.7 g/dL (ref 6.0–8.3)

## 2024-01-15 LAB — HEMOGLOBIN A1C: Hgb A1c MFr Bld: 5.6 % (ref 4.6–6.5)

## 2024-01-15 LAB — MICROALBUMIN / CREATININE URINE RATIO
Creatinine,U: 91.7 mg/dL
Microalb Creat Ratio: UNDETERMINED mg/g (ref 0.0–30.0)
Microalb, Ur: <0.7 mg/dL

## 2024-01-15 LAB — TSH: TSH: 2.09 u[IU]/mL (ref 0.35–5.50)

## 2024-01-15 LAB — VITAMIN D 25 HYDROXY (VIT D DEFICIENCY, FRACTURES): VITD: 49.6 ng/mL (ref 30.00–100.00)

## 2024-01-15 MED ORDER — HYDROXYZINE PAMOATE 25 MG PO CAPS
25.0000 mg | ORAL_CAPSULE | Freq: Three times a day (TID) | ORAL | 0 refills | Status: AC | PRN
Start: 2024-01-15 — End: ?

## 2024-01-15 MED ORDER — BUSPIRONE HCL 15 MG PO TABS: 15.0000 mg | ORAL_TABLET | Freq: Two times a day (BID) | ORAL | 2 refills | Status: DC

## 2024-01-15 MED ORDER — PROMETHAZINE-DM 6.25-15 MG/5ML PO SYRP: 5.0000 mL | ORAL_SOLUTION | Freq: Four times a day (QID) | ORAL | 0 refills | Status: AC | PRN

## 2024-01-15 MED ORDER — AMOXICILLIN-POT CLAVULANATE 875-125 MG PO TABS: 1.0000 | ORAL_TABLET | Freq: Two times a day (BID) | ORAL | 0 refills | Status: AC

## 2024-01-15 NOTE — Progress Notes (Signed)
 Established Patient Office Visit  Subjective:     Patient ID: Teresa Vasquez, female    DOB: March 17, 1950, 73 y.o.   MRN: [id number]  Chief Complaint  Patient presents with   Follow-up    Pharmacy gave her hydroxyzine  tablets and pt want to go back to capsules also would like an increase in buspar  and would like more cough syrup due to a cough she has     HPI  Discussed the use of AI scribe software for clinical note transcription with the patient, who gave verbal consent to proceed.  History of Present Illness Teresa Vasquez is a 73 year old female who presents with anxiety and sinus symptoms.  Anxiety symptoms - Increased anxiety over the past year, associated with family health issues and her husband's cognitive changes - Currently takes buspirone  10 mg at night - Considering adjusting buspirone  timing or dosage for improved daytime symptom control  Upper respiratory symptoms - Sinus symptoms onset around Sunday or Monday - Sneezing, coughing, headaches, and facial tenderness under the eyes - Nasal discharge occasionally yellow - Fatigue and general malaise since Tuesday afternoon - Dry mouth and mild upset stomach, not directly associated with sinus symptoms - Requested cough syrup for symptom relief - Open to using amoxicillin  or Augmentin  for sinus symptoms  Pruritus management - Takes hydroxyzine  for itching - Recent pharmacy error provided tablets instead of capsules     ROS Per HPI      Objective:    BP 116/80 (BP Location: Left Arm, Patient Position: Sitting, Cuff Size: Normal)   Pulse (!) 110   Temp 98 F (36.7 C) (Oral)   Ht 5' 5.75 (1.67 m)   Wt 185 lb (83.9 kg)   SpO2 98%   BMI 30.09 kg/m    Physical Exam Vitals and nursing note reviewed.  Constitutional:      General: She is not in acute distress.    Appearance: Normal appearance. She is normal weight.  HENT:     Head: Normocephalic and atraumatic.     Right Ear: External ear  normal. A middle ear effusion is present.     Left Ear: External ear normal. A middle ear effusion is present.     Nose: Congestion present.     Right Sinus: Maxillary sinus tenderness and frontal sinus tenderness present.     Left Sinus: Maxillary sinus tenderness and frontal sinus tenderness present.     Mouth/Throat:     Mouth: Mucous membranes are moist.     Comments: Oropharyngeal cobblestoning   Eyes:     Extraocular Movements: Extraocular movements intact.     Pupils: Pupils are equal, round, and reactive to light.  Cardiovascular:     Rate and Rhythm: Normal rate and regular rhythm.     Pulses: Normal pulses.     Heart sounds: Normal heart sounds.  Pulmonary:     Effort: Pulmonary effort is normal. No respiratory distress.     Breath sounds: Normal breath sounds. No wheezing, rhonchi or rales.  Musculoskeletal:        General: Normal range of motion.     Cervical back: Normal range of motion.     Right lower leg: No edema.     Left lower leg: No edema.  Lymphadenopathy:     Cervical: Cervical adenopathy present.  Neurological:     General: No focal deficit present.     Mental Status: She is alert and oriented to person, place, and time.  Psychiatric:        Mood and Affect: Mood normal.        Thought Content: Thought content normal.     No results found for any visits on 01/15/24.  The 10-year ASCVD risk score (Arnett DK, et al., 2019) is: 12.8%  BP Readings from Last 3 Encounters:  01/15/24 116/80  09/23/23 134/80  08/14/23 122/74   Wt Readings from Last 3 Encounters:  01/15/24 185 lb (83.9 kg)  09/23/23 192 lb 9.6 oz (87.4 kg)  08/14/23 195 lb (88.5 kg)      Last CBC Lab Results  Component Value Date   WBC 5.3 07/30/2023   HGB 12.8 07/30/2023   HCT 37.8 07/30/2023   MCV 89.7 07/30/2023   MCH 30.2 10/21/2022   RDW 14.2 07/30/2023   PLT 157.0 07/30/2023   Last metabolic panel Lab Results  Component Value Date   GLUCOSE 99 07/30/2023   NA 141  07/30/2023   K 3.9 07/30/2023   CL 105 07/30/2023   CO2 30 07/30/2023   BUN 13 07/30/2023   CREATININE 0.73 07/30/2023   GFR 83.18 07/30/2023   CALCIUM  9.5 07/30/2023   PROT 6.7 07/30/2023   ALBUMIN 4.4 07/30/2023   LABGLOB 2.9 01/15/2022   BILITOT 0.5 07/30/2023   ALKPHOS 86 07/30/2023   AST 15 07/30/2023   ALT 8 07/30/2023   ANIONGAP 8 08/01/2016   Last lipids Lab Results  Component Value Date   CHOL 255 (H) 10/21/2022   HDL 42 (L) 10/21/2022   LDLCALC 176 (H) 10/21/2022   TRIG 206 (H) 10/21/2022   CHOLHDL 6.1 (H) 10/21/2022   Last hemoglobin A1c Lab Results  Component Value Date   HGBA1C 5.6 10/21/2022   Last thyroid  functions Lab Results  Component Value Date   TSH 3.00 07/30/2023   Last vitamin D  Lab Results  Component Value Date   VD25OH 60 06/19/2022   Last vitamin B12 and Folate Lab Results  Component Value Date   VITAMINB12 406 06/19/2021         Assessment & Plan:   Assessment and Plan Assessment & Plan Bacterial sinusitis  Acute exacerbation with likely bacterial infection. - Prescribed Augmentin  twice daily for one week.  Generalized anxiety disorder Increased anxiety due to family health issues. Prefers to adjust current medication before adding new ones. - Increased Buspar  dosage for daytime use.  Itching Medication error corrected to manage itching. - Changed hydroxyzine  prescription to capsules.  Acute cough Cough related to sinusitis exacerbation. - Prescribed cough syrup.  Acquired hypothyroidism - Ordered thyroid  function tests. - Continue levothyroxine  Mixed hyperlipidemia - Lipids today  Medication management, long-term medication use -Labs today, will dose adjust medications as needed     Orders Placed This Encounter  Procedures   CBC with Differential/Platelet    Release to patient:   Immediate [1]   Comprehensive metabolic panel with GFR    Release to patient:   Immediate [1]   TSH   Vitamin B12    VITAMIN D  25 Hydroxy (Vit-D Deficiency, Fractures)   Hemoglobin A1c   Microalbumin / creatinine urine ratio    Release to patient:   Immediate   Lipid panel     Meds ordered this encounter  Medications   hydrOXYzine  (VISTARIL ) 25 MG capsule    Sig: Take 1 capsule (25 mg total) by mouth every 8 (eight) hours as needed.    Dispense:  120 capsule    Refill:  0   busPIRone  (BUSPAR )  15 MG tablet    Sig: Take 1 tablet (15 mg total) by mouth 2 (two) times daily.    Dispense:  60 tablet    Refill:  2   promethazine -dextromethorphan (PROMETHAZINE -DM) 6.25-15 MG/5ML syrup    Sig: Take 5 mLs by mouth 4 (four) times daily as needed for cough.    Dispense:  118 mL    Refill:  0   amoxicillin -clavulanate (AUGMENTIN ) 875-125 MG tablet    Sig: Take 1 tablet by mouth 2 (two) times daily for 7 days.    Dispense:  14 tablet    Refill:  0    Return in about 6 months (around 07/14/2024) for OV.  Corean LITTIE Ku, FNP

## 2024-01-15 NOTE — Patient Instructions (Signed)
 I have sent in Augmentin  for you to take twice a day for 10 days.  This medication can upset your stomach, so I tell everyone to take it with a meal.  Continue other current medication regimen  We are checking labs today, will be in contact with any results that require further attention  Follow-up with me in 6 mos for medication management, sooner if needed.

## 2024-01-19 ENCOUNTER — Ambulatory Visit: Payer: Self-pay | Admitting: Family Medicine

## 2024-01-19 DIAGNOSIS — E782 Mixed hyperlipidemia: Secondary | ICD-10-CM

## 2024-01-28 MED ORDER — EZETIMIBE 10 MG PO TABS: 10.0000 mg | ORAL_TABLET | Freq: Every day | ORAL | 0 refills | Status: AC

## 2024-02-07 ENCOUNTER — Other Ambulatory Visit: Payer: Self-pay | Admitting: Family Medicine

## 2024-02-07 DIAGNOSIS — K2100 Gastro-esophageal reflux disease with esophagitis, without bleeding: Secondary | ICD-10-CM

## 2024-02-07 DIAGNOSIS — Z79899 Other long term (current) drug therapy: Secondary | ICD-10-CM

## 2024-02-11 DIAGNOSIS — R300 Dysuria: Secondary | ICD-10-CM | POA: Diagnosis not present

## 2024-02-13 ENCOUNTER — Ambulatory Visit: Admitting: Family Medicine

## 2024-03-07 ENCOUNTER — Other Ambulatory Visit: Payer: Self-pay | Admitting: Family Medicine

## 2024-03-07 DIAGNOSIS — F411 Generalized anxiety disorder: Secondary | ICD-10-CM

## 2024-03-07 DIAGNOSIS — Z79899 Other long term (current) drug therapy: Secondary | ICD-10-CM

## 2024-03-07 DIAGNOSIS — F5101 Primary insomnia: Secondary | ICD-10-CM

## 2024-03-08 DIAGNOSIS — E66811 Obesity, class 1: Secondary | ICD-10-CM

## 2024-03-23 ENCOUNTER — Ambulatory Visit

## 2024-03-23 VITALS — BP 128/70 | HR 68 | Ht 65.0 in | Wt 185.4 lb

## 2024-03-23 DIAGNOSIS — Z0000 Encounter for general adult medical examination without abnormal findings: Secondary | ICD-10-CM

## 2024-03-23 NOTE — Progress Notes (Cosign Needed Addendum)
 "  Chief Complaint  Patient presents with   Medicare Wellness     Subjective:   Teresa Vasquez is a 74 y.o. female who presents for a Medicare Annual Wellness Visit.  Visit info / Clinical Intake: Medicare Wellness Visit Type:: Subsequent Annual Wellness Visit Persons participating in visit and providing information:: patient Medicare Wellness Visit Mode:: In-person (required for WTM) Interpreter Needed?: No Pre-visit prep was completed: yes AWV questionnaire completed by patient prior to visit?: no Living arrangements:: lives with spouse/significant other Patient's Overall Health Status Rating: good Typical amount of pain: none Does pain affect daily life?: no Are you currently prescribed opioids?: no  Dietary Habits and Nutritional Risks How many meals a day?: 2 Eats fruit and vegetables daily?: yes Most meals are obtained by: preparing own meals; eating out In the last 2 weeks, have you had any of the following?: none Diabetic:: no  Functional Status Activities of Daily Living (to include ambulation/medication): Independent Ambulation: Independent with device- listed below Home Assistive Devices/Equipment: Eyeglasses; Brace (specify type) (wears brace - rt knee) Medication Administration: Independent Home Management (perform basic housework or laundry): Independent Manage your own finances?: yes Primary transportation is: driving Concerns about vision?: no *vision screening is required for WTM* Concerns about hearing?: no  Fall Screening Falls in the past year?: 0 Number of falls in past year: 0 Was there an injury with Fall?: 0 Fall Risk Category Calculator: 0 Patient Fall Risk Level: Low Fall Risk  Fall Risk Patient at Risk for Falls Due to: No Fall Risks Fall risk Follow up: Falls evaluation completed; Falls prevention discussed  Home and Transportation Safety: All rugs have non-skid backing?: N/A, no rugs All stairs or steps have railings?: yes  (outside) Grab bars in the bathtub or shower?: (!) no Have non-skid surface in bathtub or shower?: yes Good home lighting?: yes Regular seat belt use?: yes Hospital stays in the last year:: no  Cognitive Assessment Difficulty concentrating, remembering, or making decisions? : no Will 6CIT or Mini Cog be Completed: yes What year is it?: 0 points What month is it?: 0 points Give patient an address phrase to remember (5 components): [address] [place], [place] About what time is it?: 0 points Count backwards from 20 to 1: 0 points Say the months of the year in reverse: 0 points Repeat the address phrase from earlier: 0 points 6 CIT Score: 0 points  Advance Directives (For Healthcare) Does Patient Have a Medical Advance Directive?: Yes Does patient want to make changes to medical advance directive?: Yes (Inpatient - patient requests chaplain consult to change a medical advance directive) Type of Advance Directive: Healthcare Power of [place]; Living will Copy of Healthcare Power of Attorney in Chart?: No - copy requested Copy of Living Will in Chart?: No - copy requested  Reviewed/Updated  Reviewed/Updated: Reviewed All (Medical, Surgical, Family, Medications, Allergies, Care Teams, Patient Goals)    Allergies (verified) Meclizine  and Levaquin [levofloxacin]   Current Medications (verified) Outpatient Encounter Medications as of 03/23/2024  Medication Sig   budesonide -formoterol  (SYMBICORT ) 80-4.5 MCG/ACT inhaler Take 1- 2 puffs first thing in am and then another 2 puffs about 12 hours later.   busPIRone  (BUSPAR ) 15 MG tablet Take 1 tablet (15 mg total) by mouth 2 (two) times daily.   Cholecalciferol (VITAMIN D3) 125 MCG (5000 UT) CAPS Takes 2 capsules (10,000 units )  Daily   clobetasol  ointment (TEMOVATE ) 0.05 % Apply 1 Application topically 2 (two) times daily.   diphenhydrAMINE -zinc  acetate (BENADRYL   EXTRA STRENGTH) cream Apply 1 Application topically 3 (three) times daily as  needed for itching.   ELDERBERRY PO Take 1 tablet by mouth daily. Calcium  and vit c in this supplement   ezetimibe  (ZETIA ) 10 MG tablet Take 1 tablet (10 mg total) by mouth daily.   famotidine  (PEPCID ) 20 MG tablet One after supper   Ferrous Sulfate (IRON PO) Take 18 mg by mouth daily.   fluticasone  (FLONASE ) 50 MCG/ACT nasal spray PLACE 2 SPRAYS INTO BOTH NOSTRILS DAILY.   hydrOXYzine  (VISTARIL ) 25 MG capsule Take 1 capsule (25 mg total) by mouth every 8 (eight) hours as needed.   levothyroxine (SYNTHROID) 25 MCG tablet Take 25 mcg by mouth daily before breakfast.   metoprolol  tartrate (LOPRESSOR ) 25 MG tablet Take 1 tablet (25 mg total) by mouth 2 (two) times daily.   montelukast  (SINGULAIR ) 10 MG tablet TAKE 1 TABLET BY MOUTH DAILY FOR ALLERGIES   omeprazole  (PRILOSEC) 40 MG capsule TAKE 1 CAPSULE BY MOUTH ONCE DAILY TO PREVENT HEARTBURN AND INDIGESTION   oxybutynin (DITROPAN-XL) 10 MG 24 hr tablet Take 10 mg by mouth at bedtime.   promethazine -dextromethorphan (PROMETHAZINE -DM) 6.25-15 MG/5ML syrup Take 5 mLs by mouth 4 (four) times daily as needed for cough.   QUEtiapine  (SEROQUEL ) 300 MG tablet TAKE 1 TABLET BY MOUTH EVERYDAY AT BEDTIME   SODIUM FLUORIDE 5000 PPM 1.1 % PSTE Take 1 application  by mouth at bedtime.   topiramate  (TOPAMAX ) 50 MG tablet TAKE 1 TABLET BY MOUTH EVERY DAY   Zinc  50 MG TABS Take 1 tablet by mouth daily.   No facility-administered encounter medications on file as of 03/23/2024.    History: Past Medical History:  Diagnosis Date   Anxiety and depression    Broken bones 09/2022   Broken left wrist   Cholecystitis, acute with cholelithiasis 12/17/2013   Depression    Diverticulitis    DIVERTICULITIS 04/19/2009   Qualifier: Diagnosis of  By: Debrah MD, Lamar BIRCH    Hyperlipidemia    Hypertension    Irritable bowel syndrome    Past Surgical History:  Procedure Laterality Date   ADENOIDECTOMY     CHOLECYSTECTOMY N/A 12/17/2013   Procedure: LAPAROSCOPIC  CHOLECYSTECTOMY WITH INTRAOPERATIVE CHOLANGIOGRAM;  Surgeon: Elon Pacini, MD;  Location: [facility] OR;  Service: General;  Laterality: N/A;   COLON SURGERY  2012   Diverticulosis   KNEE ARTHROSCOPY Right 2019   x 2   TONSILLECTOMY     TONSILLECTOMY     VAGINAL HYSTERECTOMY     WRIST SURGERY Left    10/11/2022   Family History  Problem Relation Age of Onset   Heart disease Mother    Hypertension Mother    Osteoporosis Mother    Heart disease Father    Heart attack Father 54   Asthma Sister    Irritable bowel syndrome Sister    Brain cancer Sister 47   Heart attack Brother 55   Heart disease Brother    Throat cancer Brother        remote smoker   Cancer Paternal Aunt        oral   Healthy Son    Social History   Occupational History   Occupation: Geophysicist/field Seismologist: [employer]   Occupation: Retired    Associate Professor: [employer]  Tobacco Use   Smoking status: Never    Passive exposure: Never   Smokeless tobacco: Never  Vaping Use   Vaping status: Never Used  Substance and  Sexual Activity   Alcohol use: No   Drug use: No   Sexual activity: Yes    Partners: Male    Birth control/protection: Post-menopausal   Tobacco Counseling Counseling given: No  SDOH Screenings   Food Insecurity: No Food Insecurity (03/23/2024)  Housing: Unknown (03/23/2024)  Transportation Needs: No Transportation Needs (03/23/2024)  Utilities: Not At Risk (03/23/2024)  Depression (PHQ2-9): Low Risk (03/23/2024)  Physical Activity: Inactive (03/23/2024)  Social Connections: Moderately Integrated (03/23/2024)  Stress: No Stress Concern Present (03/23/2024)  Tobacco Use: Low Risk (03/23/2024)  Health Literacy: Adequate Health Literacy (03/23/2024)   See flowsheets for full screening details  Depression Screen PHQ 2 & 9 Depression Scale- Over the past 2 weeks, how often have you been bothered by any of the following problems? Little interest or pleasure in doing things: 0 Feeling  down, depressed, or hopeless (PHQ Adolescent also includes...irritable): 0 PHQ-2 Total Score: 0 Trouble falling or staying asleep, or sleeping too much: 1 (gets 6 1/2-8hrs of sleep) Feeling tired or having little energy: 0 Poor appetite or overeating (PHQ Adolescent also includes...weight loss): 0 Feeling bad about yourself - or that you are a failure or have let yourself or your family down: 0 Trouble concentrating on things, such as reading the newspaper or watching television (PHQ Adolescent also includes...like school work): 0 Moving or speaking so slowly that other people could have noticed. Or the opposite - being so fidgety or restless that you have been moving around a lot more than usual: 0 Thoughts that you would be better off dead, or of hurting yourself in some way: 0 PHQ-9 Total Score: 1 If you checked off any problems, how difficult have these problems made it for you to do your work, take care of things at home, or get along with other people?: Not difficult at all  Depression Treatment Depression Interventions/Treatment : [id number] Score <4 Follow-up Not Indicated; Medication; Currently on Treatment     Goals Addressed               This Visit's Progress     Patient Stated (pt-stated)        Patient stated she plans to continue taking meds             Objective:    Today's Vitals   03/23/24 1345  BP: 128/70  Pulse: 68  SpO2: 98%  Weight: 185 lb 6.4 oz (84.1 kg)  Height: 5' 5 (1.651 m)   Body mass index is 30.85 kg/m.  Hearing/Vision screen Hearing Screening - Comments:: Denies hearing difficulties   Vision Screening - Comments:: Wears rx glasses - up to date with routine eye exams with [facility] Immunizations and Health Maintenance Health Maintenance  Topic Date Due   Zoster Vaccines- Shingrix (1 of 2) Never done   COVID-19 Vaccine (3 - Pfizer risk series) 08/24/2019   DTaP/Tdap/Td (2 - Td or Tdap) 08/28/2022   Mammogram  10/07/2024   Medicare  Annual Wellness (AWV)  03/23/2025   Fecal DNA (Cologuard)  08/03/2025   Pneumococcal Vaccine: 50+ Years  Completed   Bone Density Scan  Completed   Hepatitis C Screening  Completed   Meningococcal B Vaccine  Aged Out   Influenza Vaccine  Discontinued   Colonoscopy  Discontinued        Assessment/Plan:  This is a routine wellness examination for Irelynn.  I have recommended that this patient have a immunization for Shingles but she declines at this time. I have discussed the  risks and benefits of this procedure with her. The patient verbalizes understanding.   Patient Care Team: Alvia Corean CROME, FNP as PCP - General (Family Medicine) Ethyl Lonni BRAVO, MD (Inactive) as Consulting Physician (Otolaryngology) Nahser, Aleene PARAS, MD (Inactive) as Consulting Physician (Cardiology) Debrah Lamar BIRCH, MD (Inactive) as Consulting Physician (Gastroenterology) Octavia Charlie Hamilton, MD as Consulting Physician (Ophthalmology)  I have personally reviewed and noted the following in the patients chart:   Medical and social history Use of alcohol, tobacco or illicit drugs  Current medications and supplements including opioid prescriptions. Functional ability and status Nutritional status Physical activity Advanced directives List of other physicians Hospitalizations, surgeries, and ER visits in previous 12 months Vitals Screenings to include cognitive, depression, and falls Referrals and appointments  No orders of the defined types were placed in this encounter.  In addition, I have reviewed and discussed with patient certain preventive protocols, quality metrics, and best practice recommendations. A written personalized care plan for preventive services as well as general preventive health recommendations were provided to patient.   Verdie CHRISTELLA Saba, CMA   03/23/2024   Return in 1 year (on 03/23/2025).  After Visit Summary: (In Person-Declined) Patient declined AVS at this  time.  Nurse Notes: scheduled 6-mth f/u appt for 07/2024 w/PCP; scheduled 2027 AWV appt. "

## 2024-03-23 NOTE — Patient Instructions (Signed)
 Teresa Vasquez,  Thank you for taking the time for your Medicare Wellness Visit. I appreciate your continued commitment to your health goals. Please review the care plan we discussed, and feel free to reach out if I can assist you further.  Please note that Annual Wellness Visits do not include a physical exam. Some assessments may be limited, especially if the visit was conducted virtually. If needed, we may recommend an in-person follow-up with your provider.  Ongoing Care Seeing your primary care provider every 3 to 6 months helps us  monitor your health and provide consistent, personalized care.   Referrals If a referral was made during today's visit and you haven't received any updates within two weeks, please contact the referred provider directly to check on the status.  Recommended Screenings:  Health Maintenance  Topic Date Due   Zoster (Shingles) Vaccine (1 of 2) Never done   COVID-19 Vaccine (3 - Pfizer risk series) 08/24/2019   DTaP/Tdap/Td vaccine (2 - Td or Tdap) 08/28/2022   Breast Cancer Screening  10/07/2024   Medicare Annual Wellness Visit  03/23/2025   Cologuard (Stool DNA test)  08/03/2025   Pneumococcal Vaccine for age over 51  Completed   Osteoporosis screening with Bone Density Scan  Completed   Hepatitis C Screening  Completed   Meningitis B Vaccine  Aged Out   Flu Shot  Discontinued   Colon Cancer Screening  Discontinued       08/30/2021    2:07 PM  Advanced Directives  Does Patient Have a Medical Advance Directive? No  Would patient like information on creating a medical advance directive? Yes (MAU/Ambulatory/Procedural Areas - Information given)    Vision: Annual vision screenings are recommended for early detection of glaucoma, cataracts, and diabetic retinopathy. These exams can also reveal signs of chronic conditions such as diabetes and high blood pressure.  Dental: Annual dental screenings help detect early signs of oral cancer, gum disease, and  other conditions linked to overall health, including heart disease and diabetes.

## 2024-04-05 ENCOUNTER — Other Ambulatory Visit: Payer: Self-pay | Admitting: Family Medicine

## 2024-04-05 DIAGNOSIS — F411 Generalized anxiety disorder: Secondary | ICD-10-CM

## 2024-04-05 DIAGNOSIS — F5101 Primary insomnia: Secondary | ICD-10-CM

## 2024-04-05 DIAGNOSIS — Z79899 Other long term (current) drug therapy: Secondary | ICD-10-CM

## 2024-07-15 ENCOUNTER — Ambulatory Visit: Admitting: Family Medicine

## 2025-03-24 ENCOUNTER — Ambulatory Visit
